# Patient Record
Sex: Male | Born: 1945 | Race: White | Hispanic: No | Marital: Married | Smoking: Never smoker
Health system: Southern US, Community
[De-identification: ages and names within clinical notes are randomized; demographics above are authoritative.]

## PROBLEM LIST (undated history)

## (undated) DIAGNOSIS — S82009A Unspecified fracture of unspecified patella, initial encounter for closed fracture: Secondary | ICD-10-CM

## (undated) DIAGNOSIS — I10 Essential (primary) hypertension: Secondary | ICD-10-CM

## (undated) DIAGNOSIS — E7800 Pure hypercholesterolemia, unspecified: Secondary | ICD-10-CM

## (undated) DIAGNOSIS — I739 Peripheral vascular disease, unspecified: Secondary | ICD-10-CM

## (undated) HISTORY — PX: AMPUTATION: SHX166

---

## 2001-11-01 ENCOUNTER — Encounter: Payer: Self-pay | Admitting: Orthopedic Surgery

## 2001-11-02 ENCOUNTER — Inpatient Hospital Stay (HOSPITAL_COMMUNITY): Admission: RE | Admit: 2001-11-02 | Discharge: 2001-11-05 | Payer: Self-pay | Admitting: Orthopedic Surgery

## 2001-11-02 ENCOUNTER — Encounter (INDEPENDENT_AMBULATORY_CARE_PROVIDER_SITE_OTHER): Payer: Self-pay | Admitting: Specialist

## 2007-11-26 ENCOUNTER — Ambulatory Visit (HOSPITAL_COMMUNITY): Admission: RE | Admit: 2007-11-26 | Discharge: 2007-11-26 | Payer: Self-pay | Admitting: *Deleted

## 2007-11-26 ENCOUNTER — Encounter (INDEPENDENT_AMBULATORY_CARE_PROVIDER_SITE_OTHER): Payer: Self-pay | Admitting: *Deleted

## 2010-08-22 ENCOUNTER — Emergency Department (HOSPITAL_COMMUNITY): Payer: Medicare Other

## 2010-08-22 ENCOUNTER — Emergency Department (HOSPITAL_COMMUNITY)
Admission: EM | Admit: 2010-08-22 | Discharge: 2010-08-22 | Disposition: A | Discharge status: Home / Self Care | Payer: Medicare Other | Attending: Emergency Medicine | Admitting: Emergency Medicine

## 2010-08-22 DIAGNOSIS — E119 Type 2 diabetes mellitus without complications: Secondary | ICD-10-CM | POA: Insufficient documentation

## 2010-08-22 DIAGNOSIS — Z79899 Other long term (current) drug therapy: Secondary | ICD-10-CM | POA: Insufficient documentation

## 2010-08-22 DIAGNOSIS — L02419 Cutaneous abscess of limb, unspecified: Secondary | ICD-10-CM | POA: Insufficient documentation

## 2010-08-22 DIAGNOSIS — M25569 Pain in unspecified knee: Secondary | ICD-10-CM | POA: Insufficient documentation

## 2010-08-22 DIAGNOSIS — E7800 Pure hypercholesterolemia, unspecified: Secondary | ICD-10-CM | POA: Insufficient documentation

## 2010-08-22 DIAGNOSIS — M25519 Pain in unspecified shoulder: Secondary | ICD-10-CM | POA: Insufficient documentation

## 2010-08-22 DIAGNOSIS — I10 Essential (primary) hypertension: Secondary | ICD-10-CM | POA: Insufficient documentation

## 2010-08-22 DIAGNOSIS — M25469 Effusion, unspecified knee: Secondary | ICD-10-CM | POA: Insufficient documentation

## 2010-08-22 LAB — DIFFERENTIAL
Basophils Absolute: 0 10*3/uL (ref 0.0–0.1)
Basophils Relative: 0 % (ref 0–1)
Eosinophils Absolute: 0 10*3/uL (ref 0.0–0.7)
Lymphocytes Relative: 4 % — ABNORMAL LOW (ref 12–46)
Lymphs Abs: 0.5 10*3/uL — ABNORMAL LOW (ref 0.7–4.0)
Monocytes Absolute: 0.9 10*3/uL (ref 0.1–1.0)
Monocytes Relative: 7 % (ref 3–12)
Neutro Abs: 11.4 10*3/uL — ABNORMAL HIGH (ref 1.7–7.7)

## 2010-08-22 LAB — CBC
HCT: 44.6 % (ref 39.0–52.0)
Hemoglobin: 16.2 g/dL (ref 13.0–17.0)
MCH: 31.1 pg (ref 26.0–34.0)
MCHC: 36.3 g/dL — ABNORMAL HIGH (ref 30.0–36.0)
RBC: 5.21 MIL/uL (ref 4.22–5.81)
WBC: 12.8 10*3/uL — ABNORMAL HIGH (ref 4.0–10.5)

## 2010-08-23 LAB — ROCKY MTN SPOTTED FVR AB, IGM-BLOOD: RMSF IgM: 0.13 IV (ref 0.00–0.89)

## 2010-08-23 LAB — ROCKY MTN SPOTTED FVR AB, IGG-BLOOD: RMSF IgG: 0.24 IV

## 2010-08-24 NOTE — Op Note (Signed)
NAME:  KLEBER, CREAN NO.:  [id number]   MEDICAL RECORD NO.:  [id number]          PATIENT TYPE:  AMB   LOCATION:  ENDO                         FACILITY:  [facility]   PHYSICIAN:  Georgiana Spinner, M.D.    DATE OF BIRTH:  04/20/1945   DATE OF PROCEDURE:  11/26/2007  DATE OF DISCHARGE:                               OPERATIVE REPORT   PROCEDURE:  Colonoscopy with biopsy polypectomy and ERBE eradication of  tumor along with injection of bleeding site.   ANESTHESIA:  Fentanyl 125 mcg, Versed 10 mg.   PROCEDURE:  With the patient mildly sedated in the left lateral  decubitus position, a rectal examination was performed which was  unremarkable.  Subsequently, the Pentax videoscopic colonoscope was  inserted into the rectum and passed under direct vision to cecum.  This  required turning the patient to his back and subsequently to his right  side with pressure applied.  Cecum was identified by ileocecal valve and  appendiceal orifice, the latter of which was photographed.  From this  point, the colonoscope was slowly withdrawn taking circumferential views  of colonic mucosa, stopping a few folds removed from the cecum where a  large polyp was seen.  It was multilobulated, sessile, sitting on a fold  and this was photographed and we subsequently used first snare cautery  technique to remove piecemeal parts of the polyp and hot biopsy forceps  technique to also obtain pathology.  Subsequently then we used the ERBE  argon photocoagulator to eradicate the polyp.  In the midst of this, we  got some bleeding, mostly oozing from the center polypectomy site so I  elected to inject 2 mL of epinephrine into this and we stopped the  bleeding and got a blanching of the surrounding mucosa.  We then had to  finish eradicating the posterior border of the polyp which I finally was  able to do, I felt, and we photographed this area.  From this point then  the colonoscope was slowly withdrawn  taking circumferential views of the  remaining colonic mucosa, stopping a 40 cm from anal verge, at which  point there was a second polyp seen much smaller certainly and it was  photographed and removed using snare cautery technique with the same  setting of 20/150 blended current.  Tissue was retrieved by suctioning  it through the endoscope into a tissue trap.  The endoscope was  withdrawn to the rectum which appeared normal on direct and showed  hemorrhoidal tissue on retroflexed view.  The endoscope was straightened  and withdrawn.  The patient's vital signs and pulse oximeter remained  stable.  The patient tolerated procedure well without apparent  complication.   FINDINGS:  Small polyp at 40 cm from anal verge large, multiloculated  polyp of ascending colon  which was removed as described above and  eradicated as described above with ERBE argon photocoagulator.   PLAN:  Await biopsy report and clinical response.  The patient will call  me for results of biopsy and follow-up with me as needed as an  outpatient.  ______________________________  Georgiana Spinner, M.D.     GMO/MEDQ  D:  11/26/2007  T:  11/26/2007  Job:  [id number]

## 2010-08-27 NOTE — H&P (Signed)
[facility]  Patient:    Zachary [name], Zachary [name] Visit Number: [id number] MRN: [id number]          Service Type: MED Location: [phone] Attending Physician:  Nadara Mustard Dictated by:   Nadara Mustard, M.D. Admit Date:  11/02/2001                           History and Physical  HISTORY OF PRESENT ILLNESS:  The patient is a 65 year old gentleman with type 2 diabetes orally controlled who has noted a three week history of a left great toe ulcer.  This has been appropriately treated with p.o. antibiotics and wound care with failure of the ulcer to heal.  Radiographs confirm osteomyelitis with purulent drainage and patient presents at this time for a left great toe amputation.  ALLERGIES:  No known drug allergies.  MEDICATIONS: 1. Glucovance 500 mg p.o. b.i.d. 2. Avandia 4 mg p.o. q.d. 3. Accupril 40 mg q.d.  PAST SURGICAL HISTORY:  None.  FAMILY HISTORY:  Positive for hypertension.  SOCIAL HISTORY:  Negative for tobacco.  Negative for alcohol.  He is married and a Nutritional therapist.  REVIEW OF SYSTEMS:  Positive for type 2 diabetes and hypertension.  PHYSICAL EXAMINATION  VITAL SIGNS:  Temperature 97.6, pulse 80, respiratory rate 16, blood pressure 128/92.  GENERAL:  He is in no acute distress.  LUNGS:  Clear to auscultation.  CARDIOVASCULAR:  Regular rate and rhythm.  NECK:  Supple.  No bruits.  EXTREMITIES:  Examination of both feet:  He has good dorsalis pedis pulses. He does not have protective sensation and cannot feel a 5.07 Semmes-Weinstein monofilament.  He has cellulitis and a draining ulcer over the left great toe.  LABORATORIES:  Radiographs confirm osteomyelitis.  ASSESSMENT:  Osteomyelitis left great toe.  PLAN:  The patient is scheduled for an amputation of the left great toe at this time.  The risks and benefits were discussed including infection, neurovascular injury, nonhealing of the wound, need for a higher level amputation.   The patient states he understands and wishes to proceed at this time. Dictated by:   Nadara Mustard, M.D. Attending Physician:  Nadara Mustard DD:  11/02/01 TD:  11/05/01 Job: [id number]

## 2010-08-27 NOTE — Discharge Summary (Signed)
   NAME:  Zachary [name], Zachary [name]                         ACCOUNT NO.:  [id number]   MEDICAL RECORD NO.:  [id number]                   PATIENT TYPE:  INP   LOCATION:  0454                                 FACILITY:  [facility]   PHYSICIAN:  Nadara Mustard, M.D.                DATE OF BIRTH:  [birth year]   DATE OF ADMISSION:  11/02/2001  DATE OF DISCHARGE:  11/05/2001                                 DISCHARGE SUMMARY   DIAGNOSES:  1. Type 2 diabetes.  2. 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   PROCEDURE:  Amputation of left great toe.   CONDITION ON DISCHARGE:  Discharged to home in stable condition.   FOLLOWUP:  In the office in one week.   HISTORY OF PRESENT ILLNESS:  The patient is a 65 year old gentleman with  type 2 diabetes who has had a three week history of ulcer and drainage from  his left great toe.  The patient presents at this time with a history of  fever, chills, and purulent drainage, and presents for amputation of the  left great toe.   HOSPITAL COURSE:  Essentially unremarkable.  The patient underwent  amputation of the left great toe on 11/02/01.  Cultures were obtained  intraoperatively.  Esmarch at the ankle was used for tourniquet control.  The patient was placed on Kefzol perioperatively for infection, and was  treated postoperatively for the next 72 hours with IV antibiotics.  The  patient's postoperative course was essentially unremarkable.  He was  discharged to home in stable condition on 11/05/01.   DISCHARGE MEDICATIONS:  1. Tylox.  2. Keflex.   DISCHARGE INSTRUCTIONS:  Instructions were given for wound care.   FOLLOWUP:  Instructions to follow up in the office in one week.                                               Nadara Mustard, M.D.    MVD/MEDQ  D:  11/22/2001  T:  11/24/2001  Job:  [id number]

## 2010-08-27 NOTE — Op Note (Signed)
[facility]  Patient:    HULET, EHRMANN Visit Number: [id number] MRN: [id number]          Service Type: MED Location: [phone] Attending Physician:  Nadara Mustard Dictated by:   Nadara Mustard, M.D. Proc. Date: 11/02/01 Admit Date:  11/02/2001                             Operative Report  PREOPERATIVE DIAGNOSES:  Osteomyelitis, left great toe, Wagoner grade 3 ulcer.  POSTOPERATIVE DIAGNOSES:  Osteomyelitis, left great toe, Wagoner grade 3 ulcer.  PROCEDURE:  Left great toe amputation through the MTP joint.  SURGEON:  Nadara Mustard, M.D.  ANESTHESIA:  General.  ESTIMATED BLOOD LOSS:  Minimal.  TOURNIQUET TIME:  Esmarch at the ankle for approximately 10 minutes.  DRAINS:  None.  COMPLICATIONS:  None.  CULTURES:  Cultures obtained x2.  DISPOSITION:  To PACU in stable condition.  INDICATIONS FOR PROCEDURE:  The patient is a 65 year old gentleman with a Wagoner grade 3 ulcer, osteotomyelitis of his left great toe who has failed conservative care with p.o. antibiotics and wound care and presents at this time for surgical intervention.  DESCRIPTION OF PROCEDURE:  The patient was brought to OR room 3 and underwent a general anesthetic. After an adequate level of general anesthesia was obtained, the patients left lower extremity was prepped using Duraprep and draped into a sterile field. Using a fishmouth incision, the great toe was amputated through the MTP joint. The wound was irrigated with normal saline. There was no evidence of any purulence or necrotic tissue that extended to the area of the wound. The wound was closed with a far near near far stitch with 2-0 nylon with no tension on the skin. The wound was covered with Adaptic orthopedic sponges, sterile Webril and a loosely wrapped Coban. The patient was extubated and taken to the PACU in stable condition and plans to follow-up after 72 hours of IV antibiotics. Dictated by:    Nadara Mustard, M.D.  Attending Physician:  Nadara Mustard DD:  11/02/01 TD:  11/06/01 Job: [id number]

## 2010-09-01 ENCOUNTER — Other Ambulatory Visit: Payer: Self-pay | Admitting: Internal Medicine

## 2010-09-01 DIAGNOSIS — R52 Pain, unspecified: Secondary | ICD-10-CM

## 2010-09-01 DIAGNOSIS — R0789 Other chest pain: Secondary | ICD-10-CM

## 2010-09-07 ENCOUNTER — Ambulatory Visit
Admission: RE | Admit: 2010-09-07 | Discharge: 2010-09-07 | Disposition: A | Discharge status: Home / Self Care | Payer: Medicare Other | Source: Ambulatory Visit | Attending: Internal Medicine | Admitting: Internal Medicine

## 2010-09-07 DIAGNOSIS — R0789 Other chest pain: Secondary | ICD-10-CM

## 2010-09-14 ENCOUNTER — Inpatient Hospital Stay (HOSPITAL_COMMUNITY)
Admission: AD | Admit: 2010-09-14 | Discharge: 2010-09-17 | DRG: 549 | Disposition: A | Discharge status: Home / Self Care | Payer: Medicare Other | Source: Ambulatory Visit | Attending: Internal Medicine | Admitting: Internal Medicine

## 2010-09-14 DIAGNOSIS — E785 Hyperlipidemia, unspecified: Secondary | ICD-10-CM | POA: Diagnosis present

## 2010-09-14 DIAGNOSIS — I10 Essential (primary) hypertension: Secondary | ICD-10-CM | POA: Diagnosis present

## 2010-09-14 DIAGNOSIS — R911 Solitary pulmonary nodule: Secondary | ICD-10-CM | POA: Diagnosis present

## 2010-09-14 DIAGNOSIS — M2140 Flat foot [pes planus] (acquired), unspecified foot: Secondary | ICD-10-CM | POA: Diagnosis present

## 2010-09-14 DIAGNOSIS — M009 Pyogenic arthritis, unspecified: Principal | ICD-10-CM | POA: Diagnosis present

## 2010-09-14 DIAGNOSIS — S98139A Complete traumatic amputation of one unspecified lesser toe, initial encounter: Secondary | ICD-10-CM

## 2010-09-14 DIAGNOSIS — E119 Type 2 diabetes mellitus without complications: Secondary | ICD-10-CM | POA: Diagnosis present

## 2010-09-14 DIAGNOSIS — Z860100 Personal history of colon polyps, unspecified: Secondary | ICD-10-CM

## 2010-09-14 DIAGNOSIS — M1990 Unspecified osteoarthritis, unspecified site: Secondary | ICD-10-CM | POA: Diagnosis present

## 2010-09-14 DIAGNOSIS — G4733 Obstructive sleep apnea (adult) (pediatric): Secondary | ICD-10-CM | POA: Diagnosis present

## 2010-09-14 DIAGNOSIS — H5440 Blindness, one eye, unspecified eye: Secondary | ICD-10-CM | POA: Diagnosis present

## 2010-09-14 DIAGNOSIS — F17200 Nicotine dependence, unspecified, uncomplicated: Secondary | ICD-10-CM | POA: Diagnosis present

## 2010-09-14 DIAGNOSIS — A4901 Methicillin susceptible Staphylococcus aureus infection, unspecified site: Secondary | ICD-10-CM | POA: Diagnosis present

## 2010-09-14 LAB — CBC
MCH: 29.8 pg (ref 26.0–34.0)
MCHC: 36 g/dL (ref 30.0–36.0)
MCV: 82.7 fL (ref 78.0–100.0)
Platelets: 174 10*3/uL (ref 150–400)
RDW: 12 % (ref 11.5–15.5)

## 2010-09-14 LAB — GLUCOSE, CAPILLARY: Glucose-Capillary: 202 mg/dL — ABNORMAL HIGH (ref 70–99)

## 2010-09-14 LAB — DIFFERENTIAL
Eosinophils Absolute: 0.1 10*3/uL (ref 0.0–0.7)
Eosinophils Relative: 2 % (ref 0–5)
Lymphs Abs: 1.1 10*3/uL (ref 0.7–4.0)
Monocytes Absolute: 0.8 10*3/uL (ref 0.1–1.0)
Monocytes Relative: 10 % (ref 3–12)

## 2010-09-14 LAB — COMPREHENSIVE METABOLIC PANEL
Albumin: 2.9 g/dL — ABNORMAL LOW (ref 3.5–5.2)
BUN: 24 mg/dL — ABNORMAL HIGH (ref 6–23)
Calcium: 9.1 mg/dL (ref 8.4–10.5)
Chloride: 90 mEq/L — ABNORMAL LOW (ref 96–112)
Creatinine, Ser: 1.26 mg/dL (ref 0.4–1.5)
Total Bilirubin: 0.5 mg/dL (ref 0.3–1.2)
Total Protein: 7.4 g/dL (ref 6.0–8.3)

## 2010-09-15 DIAGNOSIS — I369 Nonrheumatic tricuspid valve disorder, unspecified: Secondary | ICD-10-CM

## 2010-09-15 LAB — GLUCOSE, CAPILLARY: Glucose-Capillary: 113 mg/dL — ABNORMAL HIGH (ref 70–99)

## 2010-09-15 LAB — COMPREHENSIVE METABOLIC PANEL
BUN: 20 mg/dL (ref 6–23)
Calcium: 8.6 mg/dL (ref 8.4–10.5)
Creatinine, Ser: 1.19 mg/dL (ref 0.4–1.5)
Glucose, Bld: 142 mg/dL — ABNORMAL HIGH (ref 70–99)
Total Protein: 6.7 g/dL (ref 6.0–8.3)

## 2010-09-15 LAB — DIFFERENTIAL
Basophils Absolute: 0 10*3/uL (ref 0.0–0.1)
Eosinophils Relative: 3 % (ref 0–5)
Lymphocytes Relative: 16 % (ref 12–46)
Monocytes Absolute: 0.8 10*3/uL (ref 0.1–1.0)

## 2010-09-15 LAB — HEMOGLOBIN A1C: Mean Plasma Glucose: 206 mg/dL — ABNORMAL HIGH (ref <117)

## 2010-09-15 LAB — CBC
HCT: 34.2 % — ABNORMAL LOW (ref 39.0–52.0)
MCHC: 36 g/dL (ref 30.0–36.0)
MCV: 83.6 fL (ref 78.0–100.0)
RDW: 12.2 % (ref 11.5–15.5)

## 2010-09-16 LAB — DIFFERENTIAL
Basophils Absolute: 0.1 10*3/uL (ref 0.0–0.1)
Lymphocytes Relative: 18 % (ref 12–46)
Lymphs Abs: 1.1 10*3/uL (ref 0.7–4.0)
Monocytes Absolute: 0.7 10*3/uL (ref 0.1–1.0)
Neutro Abs: 4.1 10*3/uL (ref 1.7–7.7)

## 2010-09-16 LAB — CBC
HCT: 34.1 % — ABNORMAL LOW (ref 39.0–52.0)
Hemoglobin: 11.8 g/dL — ABNORMAL LOW (ref 13.0–17.0)
MCHC: 34.6 g/dL (ref 30.0–36.0)
MCV: 85 fL (ref 78.0–100.0)

## 2010-09-16 LAB — COMPREHENSIVE METABOLIC PANEL
ALT: 26 U/L (ref 0–53)
Alkaline Phosphatase: 55 U/L (ref 39–117)
BUN: 14 mg/dL (ref 6–23)
CO2: 30 mEq/L (ref 19–32)
Calcium: 8.4 mg/dL (ref 8.4–10.5)
GFR calc non Af Amer: >60 mL/min (ref >60)
Glucose, Bld: 79 mg/dL (ref 70–99)
Potassium: 4 mEq/L (ref 3.5–5.1)
Sodium: 136 mEq/L (ref 135–145)

## 2010-09-16 LAB — GLUCOSE, CAPILLARY: Glucose-Capillary: 97 mg/dL (ref 70–99)

## 2010-09-17 LAB — GLUCOSE, CAPILLARY: Glucose-Capillary: 101 mg/dL — ABNORMAL HIGH (ref 70–99)

## 2010-09-17 LAB — VANCOMYCIN, TROUGH: Vancomycin Tr: 14.4 ug/mL (ref 10.0–20.0)

## 2010-09-18 LAB — CULTURE, BLOOD (ROUTINE X 2)

## 2010-11-27 NOTE — Discharge Summary (Signed)
NAMEMarland Kitchen  CHETT, TANIGUCHI NO.:  [id number]  MEDICAL RECORD NO.:  [id number]  LOCATION:  6708                         FACILITY:  [facility]  PHYSICIAN:  Massie Maroon, MD        DATE OF BIRTH:  09-10-1945  DATE OF ADMISSION:  09/14/2010 DATE OF DISCHARGE:  09/17/2010                              DISCHARGE SUMMARY   DISCHARGE DIAGNOSES: 1. Septic right sternoclavicular joint, ? septic right knee. 2. Diabetes type 2. 3. Hypertension. 4. Hyperlipidemia. 5. Diabetic ulcer x2, July 2003. 6. Left first toe amputation. 7. Osteoarthritis. 8. History of degenerative disk disease and sciatica. 9. Tobacco use in remission. 10.Pes planus. 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 13.History of colonic polyp. 14.Laser eye surgery for diabetic retinopathy.  DISCHARGE MEDICATIONS: 1. Zyvox 600 mg p.o. b.i.d. 2. Levemir 5 units subcu q.a.m. 3. Quinapril 20 mg p.o. daily. 4. Pravastatin 40 mg p.o. daily. 5. Oxycodone 5 mg p.o. t.i.d. p.r.n. pain. 6. Onglyza 5 mg one half p.o. nightly. 7. Nabumetone 750 mg p.o. b.i.d. p.r.n. pain. 8. Glyburide/metformin 5/500 mg two p.o. b.i.d. 9. Cardizem CD 120 mg p.o. daily.  HOSPITAL COURSE:  A 65 year old male with a history of incision and drainage of right patellar abscess by the ER physician and treated with doxycycline.  Apparently complained of fever on Monday, September 13, 2010. The patient was seen by Dr. Thayer Headings, he thought that there might be a chance of septic joint over the right [facility] joint as well as the right knee.  The patient was then evaluated by Dr. Aldean [name].  He did not think that the patient had septic joint at that time.  Blood cultures have been drawn on Monday due to complaints of fever and were positive for Gram-positive cocci and clusters (Staph aureus).  CT scan of the chest on Sep 07, 2010 had shown asymmetric right [place] joint degenerative changes  could be inflammatory arthropathy versus septic arthritis and at that time, it was not thought that the patient had any septic arthritis. The patient also had some small bilateral pulmonary nodules likely intrapulmonary lymph nodes according to CT scan, but repeat of the CT scan was suggested in 6-12 months.  The patient was admitted because of positive blood cultures to rule out any endocarditis.  The patient was evaluated by Dr. Lajoyce Corners and agreed with IV antibiotics and the patient was initiated on vancomycin and Zosyn initially.  Repeat blood cultures on day of hospitalization, September 14, 2010 again back Gram-positive cocci and clusters (Staph aureus), which was resistant to clindamycin, resistant to erythromycin, resistant to penicillin, but sensitive to vancomycin, tetracycline, and oxacillin.  The patient's right knee which appeared to be slightly red, improved.  Redness disappeared on vancomycin and the patient's right sternoclavicular joint has some fullness, but is apparently less painful.  The patient has been afebrile and appears stable and will be discharged home on Zyvox.  Of note, the patient did have a cardiac 2-D echo on September 15, 2010, which was negative for any endocarditis.  CONSULTATIONS:  Dr. Aldean [name], appreciate his input.  PROCEDURES:  Cardiac 2-D echo  on September 15, 2010; EF 55-60%, mild stenosis of the aortic valve, trivial regurgitation, valve area of 1.91 centimeter squared by VTI and 1.71 centimeter squared by Vmax (mitral valve calcified annulus, mildly thickened leaflets, no mention of any evidence of endocarditis).  PHYSICAL EXAMINATION:  VITAL SIGNS:  Temperature 98.3, pulse 84, blood pressure 131/76, pulse ox is 96% on room air. HEENT:  Anicteric. NECK:  No JVD. HEART:  Regular rate and rhythm, S1-S2, no murmurs, gallops, or rubs. LUNGS:  Clear to auscultation bilaterally. ABDOMEN:  Soft, nontender, and nondistended, positive bowel sounds. EXTREMITIES:  No  cyanosis, clubbing, or edema. SKIN:  No rashes.  Slight fullness of the right sternoclavicular joint, no erythema, skin over the right knee initially red, is now disappeared and there is no warmth. MSK:  Good movement of the right knee, no pain either and passive or active range of motion. LYMPH NODES:  No adenopathy. NEUROLOGIC:  Nonfocal.  LABORATORY STUDIES:  Blood cultures on September 14, 2010, positive for Staph aureus, sensitive to oxacillin, but resistant to penicillin, resistant to Avelox, resistant to clindamycin, resistant to erythromycin, resistant to levofloxacin, but sensitive to Bactrim, tetracycline and vancomycin.  Sed rate of September 14, 2010 65, hemoglobin A1c September 14, 2010 8.6.  ASSESSMENT: 1. Septic arthritis ? right knee creases right sternoclavicular joint     as the source:  The patient will be placed on Zyvox 600 mg p.o.     b.i.d. x1 month.  The patient will follow up with Dr. Pearson Grippe in     about 1 week to 2 weeks for a CBC.  We appreciate Dr. Berna Spare Duda's     input.  If the patient develops any fever, he was instructed to     contact the office immediately as well as if there is any bleeding     or shortness of breath. 2. Diabetes type 2, uncontrolled.  The patient is discharged on     metformin as well as for now.  We will try to improve his sugar     control when he follows up in the office. 3. Hypertension, controlled.  Continue Cardizem, continue quinapril. 4. Pulmonary nodules.  Recommended CT of chest in 6 months,     noncontrast.    Massie Maroon, MD    JYK/MEDQ  D:  09/19/2010  T:  09/20/2010  Job:  [id number]  Electronically Signed by Pearson Grippe MD on 11/27/2010 02:22:06 PM

## 2010-11-27 NOTE — H&P (Signed)
NAMEMarland Kitchen  JAWANN, URBANI NO.:  [id number]  MEDICAL RECORD NO.:  [id number]  LOCATION:  6708                         FACILITY:  [facility]  PHYSICIAN:  Massie Maroon, MD        DATE OF BIRTH:  Feb 21, 1946  DATE OF ADMISSION:  09/14/2010 DATE OF DISCHARGE:                             HISTORY & PHYSICAL   CHIEF COMPLAINT:  Fever.  HISTORY OF PRESENT ILLNESS:  A 65 year old male with history of incision and drainage of the right knee patellar?  abscess 3-4 weeks ago by an emergency room physician, apparently complains of fever on Monday.  He was seen by colleague Dr. Thayer Headings, who thought there might be a chance of septic joint.  The patient was then seen by Dr. Aldean [name] on that Monday, 2 days ago.  He did not think that this patient had septic joint.  The patient was also complaining of right sternoclavicular joint pain.  Because of her fever, blood cultures were drawn and preliminarily positive for gram-positive cocci.  CT scan of the chest showed asymmetric right [place] joint degenerative changes as described above. Findings could be due to inflammatory arthropathy.  If there is concern for septic arthritis, joint aspiration may be indicated.  Dr. Aldean [name] this past Monday did not think that joint aspiration was required. There are also some small bilateral pulmonary nodules likely intrapulmonary lymph nodes on CT scan and repeat CT scan of the chest in 6 month was recommended document stability.  Because of positive blood cultures, the patient is admitted to the hospital to rule out any endocarditis.  PAST MEDICAL HISTORY: 1. Diabetes type 2. 2. Hypertension. 3. Hyperlipidemia. 4. History of diabetic ulcer x2 July 2003. 5. History of degenerative disk disease and sciatica. 6. Tobacco use in remission. 7. Osteoarthritis. 8. Left first toe amputation. 9. Pes planus. 10.BPH. 11.History of blindness of the left eye. 12.Mild obstructive sleep apnea,  diagnosed October 16, 2009.  PAST SURGICAL HISTORY:  Colonoscopy November 26, 2007 - small polyp at 40 cm from the anal verge, large multiloculated polyp of the ascending colon, laser eye surgery to stop diabetic retinopathy.  ALLERGIES:  No known drug allergies.  MEDICATIONS:  See MAR, reviewed.  REVIEW OF SYSTEMS:  Negative for all 10 organ systems except for pertinent positives stated above.  PHYSICAL EXAMINATION:  VITAL SIGNS:  Height 5 feet 9 inches, temperature 98.0, pulse 86, blood pressure 124/74, pulse ox 97% on room air. HEENT:  Anicteric. NECK:  No JVD. HEART:  Regular rate and rhythm.  S1, S2.  No murmurs, gallops or rubs. LUNGS:  Clear to auscultation bilaterally. ABDOMEN:  Soft, nontender, positive bowel sounds. EXTREMITIES:  No cyanosis, clubbing or edema. SKIN:  There is some slight area of erythema over the right kneecap, however, this appears to be fading.  The patient does not have any complaints of the joint pain under either passive or active motion. Sternoclavicular joint swelling has decreased.  Note that the patient received doxycycline and then started on Augmentin on Monday.  ASSESSMENT/PLAN: 1. Fever with history of gram-positive cocci on blood cultures, fever     has resolved:  We will check CBC,  CMP, ESR, CRP, urinalysis,     cardiac 2-D echo.  Vancomycin IV and Zosyn IV until we find out the     results of the blood cultures. 2. Diabetes type 2, on home diabetic medications.  Fingerstick blood     sugars a.c. and at bedtime, NovoLog sensitive sliding scale. 3. Hypertension. Continue diltiazem and lisinopril. 4. DVT prophylaxis.  SCDs.     Massie Maroon, MD     JYK/MEDQ  D:  09/15/2010  T:  09/15/2010  Job:  [id number]  Electronically Signed by Pearson Grippe MD on 11/27/2010 02:21:59 PM

## 2011-04-15 DIAGNOSIS — E119 Type 2 diabetes mellitus without complications: Secondary | ICD-10-CM | POA: Diagnosis not present

## 2011-04-29 DIAGNOSIS — N289 Disorder of kidney and ureter, unspecified: Secondary | ICD-10-CM | POA: Diagnosis not present

## 2011-04-29 DIAGNOSIS — E119 Type 2 diabetes mellitus without complications: Secondary | ICD-10-CM | POA: Diagnosis not present

## 2011-04-29 DIAGNOSIS — I10 Essential (primary) hypertension: Secondary | ICD-10-CM | POA: Diagnosis not present

## 2011-05-30 DIAGNOSIS — Z125 Encounter for screening for malignant neoplasm of prostate: Secondary | ICD-10-CM | POA: Diagnosis not present

## 2011-05-30 DIAGNOSIS — R5381 Other malaise: Secondary | ICD-10-CM | POA: Diagnosis not present

## 2011-05-30 DIAGNOSIS — Z79899 Other long term (current) drug therapy: Secondary | ICD-10-CM | POA: Diagnosis not present

## 2011-05-30 DIAGNOSIS — I10 Essential (primary) hypertension: Secondary | ICD-10-CM | POA: Diagnosis not present

## 2011-06-02 DIAGNOSIS — E119 Type 2 diabetes mellitus without complications: Secondary | ICD-10-CM | POA: Diagnosis not present

## 2011-06-03 ENCOUNTER — Other Ambulatory Visit: Payer: Self-pay | Admitting: Internal Medicine

## 2011-06-03 DIAGNOSIS — N289 Disorder of kidney and ureter, unspecified: Secondary | ICD-10-CM

## 2011-06-03 DIAGNOSIS — R911 Solitary pulmonary nodule: Secondary | ICD-10-CM

## 2011-06-06 ENCOUNTER — Ambulatory Visit
Admission: RE | Admit: 2011-06-06 | Discharge: 2011-06-06 | Disposition: A | Discharge status: Home / Self Care | Payer: Medicare Other | Source: Ambulatory Visit | Attending: Internal Medicine | Admitting: Internal Medicine

## 2011-06-06 DIAGNOSIS — N289 Disorder of kidney and ureter, unspecified: Secondary | ICD-10-CM

## 2011-06-06 DIAGNOSIS — R911 Solitary pulmonary nodule: Secondary | ICD-10-CM

## 2011-06-06 DIAGNOSIS — I10 Essential (primary) hypertension: Secondary | ICD-10-CM | POA: Diagnosis not present

## 2011-06-06 DIAGNOSIS — R918 Other nonspecific abnormal finding of lung field: Secondary | ICD-10-CM | POA: Diagnosis not present

## 2011-06-06 DIAGNOSIS — E119 Type 2 diabetes mellitus without complications: Secondary | ICD-10-CM | POA: Diagnosis not present

## 2011-06-09 DIAGNOSIS — E291 Testicular hypofunction: Secondary | ICD-10-CM | POA: Diagnosis not present

## 2011-06-22 DIAGNOSIS — J069 Acute upper respiratory infection, unspecified: Secondary | ICD-10-CM | POA: Diagnosis not present

## 2011-06-22 DIAGNOSIS — R05 Cough: Secondary | ICD-10-CM | POA: Diagnosis not present

## 2011-06-22 DIAGNOSIS — R5381 Other malaise: Secondary | ICD-10-CM | POA: Diagnosis not present

## 2011-06-22 DIAGNOSIS — J029 Acute pharyngitis, unspecified: Secondary | ICD-10-CM | POA: Diagnosis not present

## 2011-07-05 DIAGNOSIS — E7800 Pure hypercholesterolemia, unspecified: Secondary | ICD-10-CM | POA: Diagnosis not present

## 2011-07-05 DIAGNOSIS — E119 Type 2 diabetes mellitus without complications: Secondary | ICD-10-CM | POA: Diagnosis not present

## 2011-07-05 DIAGNOSIS — E291 Testicular hypofunction: Secondary | ICD-10-CM | POA: Diagnosis not present

## 2011-07-05 DIAGNOSIS — I10 Essential (primary) hypertension: Secondary | ICD-10-CM | POA: Diagnosis not present

## 2011-09-06 DIAGNOSIS — E119 Type 2 diabetes mellitus without complications: Secondary | ICD-10-CM | POA: Diagnosis not present

## 2011-09-06 DIAGNOSIS — I10 Essential (primary) hypertension: Secondary | ICD-10-CM | POA: Diagnosis not present

## 2011-09-06 DIAGNOSIS — R5383 Other fatigue: Secondary | ICD-10-CM | POA: Diagnosis not present

## 2011-09-06 DIAGNOSIS — Z79899 Other long term (current) drug therapy: Secondary | ICD-10-CM | POA: Diagnosis not present

## 2011-09-09 DIAGNOSIS — E119 Type 2 diabetes mellitus without complications: Secondary | ICD-10-CM | POA: Diagnosis not present

## 2011-09-09 DIAGNOSIS — I10 Essential (primary) hypertension: Secondary | ICD-10-CM | POA: Diagnosis not present

## 2011-09-09 DIAGNOSIS — E7800 Pure hypercholesterolemia, unspecified: Secondary | ICD-10-CM | POA: Diagnosis not present

## 2011-09-09 DIAGNOSIS — L539 Erythematous condition, unspecified: Secondary | ICD-10-CM | POA: Diagnosis not present

## 2011-10-07 DIAGNOSIS — E119 Type 2 diabetes mellitus without complications: Secondary | ICD-10-CM | POA: Diagnosis not present

## 2011-11-27 ENCOUNTER — Encounter (HOSPITAL_COMMUNITY): Payer: Self-pay | Admitting: Emergency Medicine

## 2011-11-27 ENCOUNTER — Emergency Department (HOSPITAL_COMMUNITY): Payer: Medicare Other

## 2011-11-27 ENCOUNTER — Inpatient Hospital Stay (HOSPITAL_COMMUNITY)
Admission: EM | Admit: 2011-11-27 | Discharge: 2011-11-29 | DRG: 504 | Disposition: A | Discharge status: Home / Self Care | Payer: Medicare Other | Attending: Orthopedic Surgery | Admitting: Orthopedic Surgery

## 2011-11-27 DIAGNOSIS — I96 Gangrene, not elsewhere classified: Secondary | ICD-10-CM | POA: Diagnosis present

## 2011-11-27 DIAGNOSIS — Z7982 Long term (current) use of aspirin: Secondary | ICD-10-CM

## 2011-11-27 DIAGNOSIS — I2510 Atherosclerotic heart disease of native coronary artery without angina pectoris: Secondary | ICD-10-CM | POA: Diagnosis not present

## 2011-11-27 DIAGNOSIS — E119 Type 2 diabetes mellitus without complications: Secondary | ICD-10-CM | POA: Diagnosis not present

## 2011-11-27 DIAGNOSIS — I739 Peripheral vascular disease, unspecified: Secondary | ICD-10-CM | POA: Diagnosis present

## 2011-11-27 DIAGNOSIS — K08109 Complete loss of teeth, unspecified cause, unspecified class: Secondary | ICD-10-CM | POA: Diagnosis present

## 2011-11-27 DIAGNOSIS — M8610 Other acute osteomyelitis, unspecified site: Secondary | ICD-10-CM | POA: Diagnosis not present

## 2011-11-27 DIAGNOSIS — L539 Erythematous condition, unspecified: Secondary | ICD-10-CM | POA: Diagnosis present

## 2011-11-27 DIAGNOSIS — I10 Essential (primary) hypertension: Secondary | ICD-10-CM | POA: Diagnosis present

## 2011-11-27 DIAGNOSIS — Z794 Long term (current) use of insulin: Secondary | ICD-10-CM

## 2011-11-27 DIAGNOSIS — M869 Osteomyelitis, unspecified: Secondary | ICD-10-CM | POA: Diagnosis not present

## 2011-11-27 DIAGNOSIS — Z79899 Other long term (current) drug therapy: Secondary | ICD-10-CM

## 2011-11-27 DIAGNOSIS — E1159 Type 2 diabetes mellitus with other circulatory complications: Secondary | ICD-10-CM | POA: Diagnosis present

## 2011-11-27 DIAGNOSIS — L02818 Cutaneous abscess of other sites: Secondary | ICD-10-CM | POA: Diagnosis not present

## 2011-11-27 DIAGNOSIS — M86679 Other chronic osteomyelitis, unspecified ankle and foot: Secondary | ICD-10-CM | POA: Diagnosis not present

## 2011-11-27 DIAGNOSIS — L97509 Non-pressure chronic ulcer of other part of unspecified foot with unspecified severity: Secondary | ICD-10-CM | POA: Diagnosis not present

## 2011-11-27 DIAGNOSIS — L089 Local infection of the skin and subcutaneous tissue, unspecified: Secondary | ICD-10-CM | POA: Diagnosis not present

## 2011-11-27 HISTORY — DX: Essential (primary) hypertension: I10

## 2011-11-27 LAB — CBC WITH DIFFERENTIAL/PLATELET
Basophils Absolute: 0.1 10*3/uL (ref 0.0–0.1)
Eosinophils Absolute: 0.2 10*3/uL (ref 0.0–0.7)
Eosinophils Relative: 3 % (ref 0–5)
Lymphs Abs: 1.1 10*3/uL (ref 0.7–4.0)
MCH: 31 pg (ref 26.0–34.0)
Neutrophils Relative %: 69 % (ref 43–77)
Platelets: 150 10*3/uL (ref 150–400)
RBC: 5.52 MIL/uL (ref 4.22–5.81)
RDW: 12.5 % (ref 11.5–15.5)
WBC: 6.3 10*3/uL (ref 4.0–10.5)

## 2011-11-27 LAB — COMPREHENSIVE METABOLIC PANEL
ALT: 23 U/L (ref 0–53)
AST: 16 U/L (ref 0–37)
Albumin: 3.7 g/dL (ref 3.5–5.2)
CO2: 26 mEq/L (ref 19–32)
Calcium: 9.1 mg/dL (ref 8.4–10.5)
Creatinine, Ser: 1.62 mg/dL — ABNORMAL HIGH (ref 0.50–1.35)
Sodium: 129 mEq/L — ABNORMAL LOW (ref 135–145)
Total Protein: 7.2 g/dL (ref 6.0–8.3)

## 2011-11-27 LAB — SEDIMENTATION RATE: Sed Rate: 9 mm/hr (ref 0–16)

## 2011-11-27 MED ORDER — INSULIN ASPART 100 UNIT/ML ~~LOC~~ SOLN
0.0000 [IU] | Freq: Three times a day (TID) | SUBCUTANEOUS | Status: DC
  Administered 2011-11-28 – 2011-11-29 (×4): 5 [IU] via SUBCUTANEOUS

## 2011-11-27 MED ORDER — SODIUM CHLORIDE 0.9 % IV SOLN
3.0000 g | Freq: Once | INTRAVENOUS | Status: AC
  Administered 2011-11-27: 3 g via INTRAVENOUS
  Filled 2011-11-27: qty 3

## 2011-11-27 MED ORDER — SODIUM CHLORIDE 0.9 % IV SOLN: INTRAVENOUS | Status: DC

## 2011-11-27 NOTE — ED Provider Notes (Signed)
History     CSN: [id number]  Arrival date & time 11/27/11  1643   First MD Initiated Contact with Patient 11/27/11 1823      Chief Complaint  Patient presents with  . left 2nd toe infection   . Hyperglycemia    (Consider location/radiation/quality/duration/timing/severity/associated sxs/prior treatment) HPI Pt is a 66 yo male w pmh of DM and HTN p/w 3 days of swelling, redness, and pain of L 2nd toe. Mr. Kelter reports that at baseline he has a small callous over the pad of his L 2nd toe which occasionally becomes infected with minor trauma. This past Friday, he noticed some redness and swelling of L 2nd toe of his foot. Over the past three days, pt reports progression of symptoms. This morning his wife noticed a red streak tracking up his shin from the swollen toe and insisted he come to the hospital. He also reported stubbing this same toe a couple of months ago and breaking the skin. He was treated with a course of abx and pain/swelling resolved. 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  He denies fever, chills, nausea, vomiting, CP, SOB, dizziness, presyncope. He says he has not taken his insulin yet today.    Past Medical History  Diagnosis Date  . Diabetes mellitus   . Hypertension     Past Surgical History  Procedure Date  . Amputation     History reviewed. No pertinent family history.  History  Substance Use Topics  . Smoking status: Never Smoker   . Smokeless tobacco: Not on file  . Alcohol Use: No      Review of Systems  Constitutional: Negative for fever and chills.  HENT: Negative for congestion.   Eyes: Negative for visual disturbance.  Respiratory: Negative for cough and chest tightness.   Cardiovascular: Negative for chest pain and palpitations.  Gastrointestinal: Negative for nausea, vomiting, abdominal pain and diarrhea.  Genitourinary: Negative for dysuria, hematuria and  flank pain.  Skin: Positive for wound.       Per HPI  Neurological: Negative for dizziness, syncope, weakness and headaches.    Allergies  Review of patient's allergies indicates no known allergies.  Home Medications   Current Outpatient Rx  Name Route Sig Dispense Refill  . ASPIRIN EC 81 MG PO TBEC Oral Take 81 mg by mouth daily.    Marland Kitchen DILTIAZEM HCL ER 120 MG PO CP24 Oral Take 120 mg by mouth daily.    . OMEGA-3 FATTY ACIDS 1000 MG PO CAPS Oral Take 2 g by mouth daily.    . INSULIN DETEMIR 100 UNIT/ML [place] SOLN Subcutaneous Inject 20 Units into the skin daily.    . INSULIN LISPRO (HUMAN) 100 UNIT/ML [place] SOLN Subcutaneous Inject 4 Units into the skin 3 (three) times daily before meals.    Marland Kitchen PRAVASTATIN SODIUM 40 MG PO TABS Oral Take 40 mg by mouth at bedtime.    . QUINAPRIL HCL 20 MG PO TABS Oral Take 20 mg by mouth 2 (two) times daily.    . TRAMADOL HCL 50 MG PO TABS Oral Take 50 mg by mouth 3 (three) times daily.      BP 111/60  Pulse 85  Temp 98.6 F (37 C) (Oral)  Resp 18  Ht 5' 8.5" (1.74 m)  Wt 189 lb (85.73 kg)  BMI 28.32 kg/m2  SpO2 96%  Physical Exam  Constitutional: He is oriented to person, place, and time. He  appears well-developed and well-nourished. No distress.  HENT:  Head: Normocephalic and atraumatic.  Mouth/Throat: Oropharynx is clear and moist.  Eyes: Conjunctivae are normal. Pupils are equal, round, and reactive to light. No scleral icterus.  Neck: Normal range of motion. Neck supple. No JVD present.  Cardiovascular: Normal rate, regular rhythm, normal heart sounds and intact distal pulses.  Exam reveals no gallop and no friction rub.   No murmur heard. Pulmonary/Chest: Effort normal. No respiratory distress.  Abdominal: Soft. Bowel sounds are normal. He exhibits no distension. There is no tenderness.  Neurological: He is alert and oriented to person, place, and time. No cranial nerve deficit.  Skin: He is not diaphoretic.       ED Course    Procedures (including critical care time)  Labs Reviewed  GLUCOSE, CAPILLARY - Abnormal; Notable for the following:    Glucose-Capillary 340 (*)     All other components within normal limits  CBC WITH DIFFERENTIAL - Abnormal; Notable for the following:    Hemoglobin 17.1 (*)     MCHC 36.3 (*)     All other components within normal limits  COMPREHENSIVE METABOLIC PANEL - Abnormal; Notable for the following:    Sodium 129 (*)     Chloride 93 (*)     Glucose, Bld 305 (*)     BUN 27 (*)     Creatinine, Ser 1.62 (*)     GFR calc non Af Amer 42 (*)     GFR calc Af Amer 49 (*)     All other components within normal limits  SEDIMENTATION RATE   Dg Chest 2 View  11/27/2011  *RADIOLOGY REPORT*  Clinical Data: Coronary artery disease, hypertension.  CHEST - 2 VIEW  Comparison: None.  Findings: Cardiomediastinal silhouette appears normal.  No acute pulmonary disease is noted.  Bony thorax is intact.  IMPRESSION: No acute cardiopulmonary abnormality seen.  Original Report Authenticated By: Venita Sheffield., M.D.   Dg Toe 2nd Left  11/27/2011  *RADIOLOGY REPORT*  Clinical Data: Diabetic foot ulcer.  Rule out osteomyelitis  LEFT SECOND TOE  Comparison: None.  Findings: Soft tissue swelling of the distal second toe.  There is erosion of the tuft of the distal second phalanx compatible with osteomyelitis.  Amputation of the great toe.  IMPRESSION: Osteomyelitis of the distal second phalanx.  Original Report Authenticated By: Camelia Phenes, M.D.    Date: 11/27/2011  Rate: 82  Rhythm: normal sinus rhythm  QRS Axis: normal  Intervals: normal  ST/T Wave abnormalities: normal  Conduction Disutrbances:none  Narrative Interpretation: Normal EKG  Old EKG Reviewed: none available    No diagnosis found.    MDM  1. Osteomyelitis L 2nd toe Pt w cellulitic changes over L 2nd toe and streaking erythema. No leukocytosis or fever. Hemodynamically stable. Gave IV Unasynx1. Xray returned c/w  osteomyelitis of distal phalanx. Called ortho. They will evaluate.  2. Hyperglycemia Pt missed insulin doses today due to concern about toe. BG is 341, normal AG. Suspect will correct w home insulin. Pt npo for now as possible surgical intervention today.   Bronson Curb 11/27/2011 9:58 PM    Ortho has seen, will admit tonight for procedure tomorrow. Bronson Curb 11/27/2011 11:18 PM   Bronson Curb, MD 11/27/11 [phone]

## 2011-11-27 NOTE — ED Notes (Signed)
Was trimming toenail 2 months ago, cut toe, saw dr. Rochele Pages antibiotics, but now is swollen, red, draining clear liquid- has had great toe on left foot amputated for same.

## 2011-11-27 NOTE — ED Notes (Signed)
CBG registered 340 on ED glucometer.

## 2011-11-27 NOTE — ED Notes (Signed)
Pt reports he trimmed his toenails about a month ago and toe started to be painful and swell. Pt reports this happened intermittently and would go away at times. Pt left second toe is swollen, red and draining yellow pus. Pt has red streaking moving up foot and lower leg. Area has been outlined upon arrival.  Pt denies fever. Pt reports toe became swollen on Friday and wife noted red streaking up leg this AM.  Pt has history of great toe on left side removed.  Pt denies pain.  Strong pedal pulse present and patient able to sense light touch to bottom of left foot. Pt reports blood sugars have been running higher in last month

## 2011-11-27 NOTE — H&P (Signed)
Zachary [name] is an 66 y.o. male.   Chief Complaint: left toe infection HPI: Zachary [name] is a 66 year old patient with left second toe swelling erythema. This is been going on for a week to 10 days. Patient describes having similar symptoms with right great toe which required a dictation by Dr. due to. He denies any fever chills or malaise. He denies much in the way of drainage but does report progressive worsening of the appearance of the second toe along with erythema which started ascending his leg within the past 24-48 hours. He has not been on any antibiotics except for that given to him in the emergency room.  Past Medical History  Diagnosis Date  . Diabetes mellitus   . Hypertension     Past Surgical History  Procedure Date  . Amputation     History reviewed. No pertinent family history. Social History:  reports that he has never smoked. He does not have any smokeless tobacco history on file. He reports that he does not drink alcohol or use illicit drugs.  Allergies: No Known Allergies   (Not in a hospital admission)  Results for orders placed during the hospital encounter of 11/27/11 (from the past 48 hour(s))  GLUCOSE, CAPILLARY     Status: Abnormal   Collection Time   11/27/11  5:45 PM      Component Value Range Comment   Glucose-Capillary 340 (*) 70 - 99 mg/dL   CBC WITH DIFFERENTIAL     Status: Abnormal   Collection Time   11/27/11  7:16 PM      Component Value Range Comment   WBC 6.3  4.0 - 10.5 K/uL    RBC 5.52  4.22 - 5.81 MIL/uL    Hemoglobin 17.1 (*) 13.0 - 17.0 g/dL    HCT 96.0  45.4 - 09.8 %    MCV 85.3  78.0 - 100.0 fL    MCH 31.0  26.0 - 34.0 pg    MCHC 36.3 (*) 30.0 - 36.0 g/dL    RDW 11.9  14.7 - 82.9 %    Platelets 150  150 - 400 K/uL    Neutrophils Relative 69  43 - 77 %    Neutro Abs 4.4  1.7 - 7.7 K/uL    Lymphocytes Relative 17  12 - 46 %    Lymphs Abs 1.1  0.7 - 4.0 K/uL    Monocytes Relative 10  3 - 12 %    Monocytes Absolute 0.6  0.1 -  1.0 K/uL    Eosinophils Relative 3  0 - 5 %    Eosinophils Absolute 0.2  0.0 - 0.7 K/uL    Basophils Relative 1  0 - 1 %    Basophils Absolute 0.1  0.0 - 0.1 K/uL   SEDIMENTATION RATE     Status: Normal   Collection Time   11/27/11  7:16 PM      Component Value Range Comment   Sed Rate 9  0 - 16 mm/hr   COMPREHENSIVE METABOLIC PANEL     Status: Abnormal   Collection Time   11/27/11  7:35 PM      Component Value Range Comment   Sodium 129 (*) 135 - 145 mEq/L    Potassium 4.2  3.5 - 5.1 mEq/L    Chloride 93 (*) 96 - 112 mEq/L    CO2 26  19 - 32 mEq/L    Glucose, Bld 305 (*) 70 - 99 mg/dL    BUN  27 (*) 6 - 23 mg/dL    Creatinine, Ser 9.60 (*) 0.50 - 1.35 mg/dL    Calcium 9.1  8.4 - 45.4 mg/dL    Total Protein 7.2  6.0 - 8.3 g/dL    Albumin 3.7  3.5 - 5.2 g/dL    AST 16  0 - 37 U/L    ALT 23  0 - 53 U/L    Alkaline Phosphatase 66  39 - 117 U/L    Total Bilirubin 0.6  0.3 - 1.2 mg/dL    GFR calc non Af Amer 42 (*) >90 mL/min    GFR calc Af Amer 49 (*) >90 mL/min    Dg Chest 2 View  11/27/2011  *RADIOLOGY REPORT*  Clinical Data: Coronary artery disease, hypertension.  CHEST - 2 VIEW  Comparison: None.  Findings: Cardiomediastinal silhouette appears normal.  No acute pulmonary disease is noted.  Bony thorax is intact.  IMPRESSION: No acute cardiopulmonary abnormality seen.  Original Report Authenticated By: Venita Sheffield., M.D.   Dg Toe 2nd Left  11/27/2011  *RADIOLOGY REPORT*  Clinical Data: Diabetic foot ulcer.  Rule out osteomyelitis  LEFT SECOND TOE  Comparison: None.  Findings: Soft tissue swelling of the distal second toe.  There is erosion of the tuft of the distal second phalanx compatible with osteomyelitis.  Amputation of the great toe.  IMPRESSION: Osteomyelitis of the distal second phalanx.  Original Report Authenticated By: Camelia Phenes, M.D.    Review of Systems  Constitutional: Negative.   HENT: Negative.   Eyes: Negative.   Respiratory: Negative.     Cardiovascular: Negative.   Gastrointestinal: Negative.   Genitourinary: Negative.   Musculoskeletal: Negative.   Skin: Negative.   Neurological: Negative.   Endo/Heme/Allergies: Negative.   Psychiatric/Behavioral: Negative.     Blood pressure 111/60, pulse 85, temperature 98.6 F (37 C), temperature source Oral, resp. rate 18, height 5' 8.5" (1.74 m), weight 85.73 kg (189 lb), SpO2 96.00%. Physical Exam  Constitutional: He is oriented to person, place, and time. He appears well-developed.  HENT:  Head: Normocephalic.  Eyes: Pupils are equal, round, and reactive to light.  Neck: Normal range of motion.  Cardiovascular: Normal rate.   Respiratory: Effort normal.  GI: Soft.  Neurological: He is alert and oriented to person, place, and time.  Skin: Skin is warm.  Psychiatric: He has a normal mood and affect. His behavior is normal. Judgment and thought content normal.   left second toe with erythema and gangrene - 1st toe amputated at MTP joint - dp 2/4 - erythema extends proximally up the leg to the midcalf level medially. Compartments are soft. No tissue fluctuance or crepitus. No plantar ulcerations except on the distal tuft of the second toe. There is no fluctuance in the foot and no dorsal erythema. The toe itself has had significant swelling and erythema.  Assessment/Plan Impression is left second toe osteomyelitis. Plan is for admission with IV antibiotics and second toe amputation tomorrow. Risk and benefits discussed with the patient due to limited to infection recurrent infection nerve vessel damage possibility of higher level he dictation. Patient understands the risk and benefits and wishes to proceed. In regards to his hyponatremia plan is for normal saline IV fluid overnight with recheck in the morning. He does have osteomyelitis by radiographs and thus more car surgery. No evidence of fluctuance or abscess in the foot itself. Medical decision-making calcaneum by multiple  medical comorbidities which complicated the patient care as well as  the decision for surgery tomorrow. N.p.o. after 9 AM tomorrow. Anticipate surgery after 5:00 based on the OR availability.  Zachary [name] 11/27/2011, 10:55 PM

## 2011-11-28 ENCOUNTER — Encounter (HOSPITAL_COMMUNITY): Payer: Self-pay | Admitting: Anesthesiology

## 2011-11-28 ENCOUNTER — Encounter (HOSPITAL_COMMUNITY)
Admission: EM | Disposition: A | Discharge status: Home / Self Care | Payer: Self-pay | Source: Home / Self Care | Attending: Orthopedic Surgery

## 2011-11-28 ENCOUNTER — Inpatient Hospital Stay (HOSPITAL_COMMUNITY): Payer: Medicare Other | Admitting: Anesthesiology

## 2011-11-28 DIAGNOSIS — Z794 Long term (current) use of insulin: Secondary | ICD-10-CM | POA: Diagnosis not present

## 2011-11-28 DIAGNOSIS — M869 Osteomyelitis, unspecified: Secondary | ICD-10-CM | POA: Diagnosis not present

## 2011-11-28 DIAGNOSIS — I96 Gangrene, not elsewhere classified: Secondary | ICD-10-CM | POA: Diagnosis not present

## 2011-11-28 DIAGNOSIS — E1159 Type 2 diabetes mellitus with other circulatory complications: Secondary | ICD-10-CM | POA: Diagnosis not present

## 2011-11-28 DIAGNOSIS — L02818 Cutaneous abscess of other sites: Secondary | ICD-10-CM | POA: Diagnosis not present

## 2011-11-28 DIAGNOSIS — M86679 Other chronic osteomyelitis, unspecified ankle and foot: Secondary | ICD-10-CM | POA: Diagnosis not present

## 2011-11-28 DIAGNOSIS — L97509 Non-pressure chronic ulcer of other part of unspecified foot with unspecified severity: Secondary | ICD-10-CM | POA: Diagnosis not present

## 2011-11-28 DIAGNOSIS — L089 Local infection of the skin and subcutaneous tissue, unspecified: Secondary | ICD-10-CM | POA: Diagnosis not present

## 2011-11-28 HISTORY — PX: AMPUTATION: SHX166

## 2011-11-28 LAB — BASIC METABOLIC PANEL
BUN: 17 mg/dL (ref 6–23)
Calcium: 8.8 mg/dL (ref 8.4–10.5)
Creatinine, Ser: 1.15 mg/dL (ref 0.50–1.35)
GFR calc Af Amer: 74 mL/min — ABNORMAL LOW (ref >90)
GFR calc non Af Amer: 64 mL/min — ABNORMAL LOW (ref >90)
Glucose, Bld: 209 mg/dL — ABNORMAL HIGH (ref 70–99)
Potassium: 4.1 mEq/L (ref 3.5–5.1)

## 2011-11-28 LAB — GLUCOSE, CAPILLARY
Glucose-Capillary: 244 mg/dL — ABNORMAL HIGH (ref 70–99)
Glucose-Capillary: 235 mg/dL — ABNORMAL HIGH (ref 70–99)

## 2011-11-28 SURGERY — AMPUTATION, FOOT, RAY
Anesthesia: General | Laterality: Left | Wound class: Dirty or Infected

## 2011-11-28 MED ORDER — SIMVASTATIN 5 MG PO TABS
5.0000 mg | ORAL_TABLET | Freq: Every day | ORAL | Status: DC
  Filled 2011-11-28 (×2): qty 1

## 2011-11-28 MED ORDER — ENOXAPARIN SODIUM 30 MG/0.3ML ~~LOC~~ SOLN
30.0000 mg | Freq: Two times a day (BID) | SUBCUTANEOUS | Status: DC
  Administered 2011-11-29: 30 mg via SUBCUTANEOUS
  Filled 2011-11-28 (×2): qty 0.3

## 2011-11-28 MED ORDER — ONDANSETRON HCL 4 MG/2ML IJ SOLN: 4.0000 mg | Freq: Four times a day (QID) | INTRAMUSCULAR | Status: DC | PRN

## 2011-11-28 MED ORDER — LIDOCAINE HCL (CARDIAC) 20 MG/ML IV SOLN
INTRAVENOUS | Status: DC | PRN
  Administered 2011-11-28: 50 mg via INTRAVENOUS

## 2011-11-28 MED ORDER — CHLORHEXIDINE GLUCONATE 4 % EX LIQD
Freq: Once | CUTANEOUS | Status: AC
  Administered 2011-11-28: 17:00:00 via TOPICAL
  Filled 2011-11-28: qty 15

## 2011-11-28 MED ORDER — VANCOMYCIN HCL IN DEXTROSE 1-5 GM/200ML-% IV SOLN
1000.0000 mg | Freq: Two times a day (BID) | INTRAVENOUS | Status: AC
  Administered 2011-11-29: 1000 mg via INTRAVENOUS
  Filled 2011-11-28: qty 200

## 2011-11-28 MED ORDER — VANCOMYCIN HCL IN DEXTROSE 1-5 GM/200ML-% IV SOLN: 1000.0000 mg | Freq: Two times a day (BID) | INTRAVENOUS | Status: DC

## 2011-11-28 MED ORDER — TRAMADOL HCL 50 MG PO TABS
50.0000 mg | ORAL_TABLET | Freq: Three times a day (TID) | ORAL | Status: DC
  Administered 2011-11-28 – 2011-11-29 (×2): 50 mg via ORAL
  Filled 2011-11-28 (×6): qty 1

## 2011-11-28 MED ORDER — 0.9 % SODIUM CHLORIDE (POUR BTL) OPTIME
TOPICAL | Status: DC | PRN
  Administered 2011-11-28: 1000 mL

## 2011-11-28 MED ORDER — POTASSIUM CHLORIDE IN NACL 20-0.9 MEQ/L-% IV SOLN
INTRAVENOUS | Status: AC
  Administered 2011-11-28: 22:00:00 via INTRAVENOUS
  Filled 2011-11-28: qty 1000

## 2011-11-28 MED ORDER — ONDANSETRON HCL 4 MG/2ML IJ SOLN
INTRAMUSCULAR | Status: DC | PRN
  Administered 2011-11-28: 4 mg via INTRAVENOUS

## 2011-11-28 MED ORDER — DILTIAZEM HCL ER 120 MG PO CP24
120.0000 mg | ORAL_CAPSULE | Freq: Every day | ORAL | Status: DC
  Administered 2011-11-28 – 2011-11-29 (×2): 120 mg via ORAL
  Filled 2011-11-28 (×2): qty 1

## 2011-11-28 MED ORDER — QUINAPRIL HCL 10 MG PO TABS: 20.0000 mg | ORAL_TABLET | Freq: Two times a day (BID) | ORAL | Status: DC

## 2011-11-28 MED ORDER — CHLORHEXIDINE GLUCONATE 4 % EX LIQD
60.0000 mL | Freq: Once | CUTANEOUS | Status: DC
  Filled 2011-11-28: qty 60

## 2011-11-28 MED ORDER — LISINOPRIL 20 MG PO TABS
20.0000 mg | ORAL_TABLET | Freq: Two times a day (BID) | ORAL | Status: DC
  Administered 2011-11-28 – 2011-11-29 (×3): 20 mg via ORAL
  Filled 2011-11-28 (×5): qty 1

## 2011-11-28 MED ORDER — POTASSIUM CHLORIDE IN NACL 20-0.9 MEQ/L-% IV SOLN
INTRAVENOUS | Status: DC
  Administered 2011-11-28: 06:00:00 via INTRAVENOUS
  Filled 2011-11-28 (×2): qty 1000

## 2011-11-28 MED ORDER — VANCOMYCIN HCL 1000 MG IV SOLR
1000.0000 mg | INTRAVENOUS | Status: DC | PRN
  Administered 2011-11-28: 1000 mg via INTRAVENOUS

## 2011-11-28 MED ORDER — METHOCARBAMOL 500 MG PO TABS: 500.0000 mg | ORAL_TABLET | Freq: Four times a day (QID) | ORAL | Status: DC | PRN

## 2011-11-28 MED ORDER — METOCLOPRAMIDE HCL 10 MG PO TABS: 5.0000 mg | ORAL_TABLET | Freq: Three times a day (TID) | ORAL | Status: DC | PRN

## 2011-11-28 MED ORDER — FENTANYL CITRATE 0.05 MG/ML IJ SOLN
INTRAMUSCULAR | Status: DC | PRN
  Administered 2011-11-28 (×2): 50 ug via INTRAVENOUS

## 2011-11-28 MED ORDER — LACTATED RINGERS IV SOLN
INTRAVENOUS | Status: DC | PRN
  Administered 2011-11-28: 17:00:00 via INTRAVENOUS

## 2011-11-28 MED ORDER — METOCLOPRAMIDE HCL 5 MG/ML IJ SOLN: 5.0000 mg | Freq: Three times a day (TID) | INTRAMUSCULAR | Status: DC | PRN

## 2011-11-28 MED ORDER — LACTATED RINGERS IV SOLN: INTRAVENOUS | Status: DC

## 2011-11-28 MED ORDER — ONDANSETRON HCL 4 MG PO TABS: 4.0000 mg | ORAL_TABLET | Freq: Four times a day (QID) | ORAL | Status: DC | PRN

## 2011-11-28 MED ORDER — PROPOFOL 10 MG/ML IV EMUL
INTRAVENOUS | Status: DC | PRN
  Administered 2011-11-28: 160 mg via INTRAVENOUS

## 2011-11-28 MED ORDER — ACETAMINOPHEN 10 MG/ML IV SOLN
INTRAVENOUS | Status: AC
  Filled 2011-11-28: qty 100

## 2011-11-28 MED ORDER — VANCOMYCIN HCL IN DEXTROSE 1-5 GM/200ML-% IV SOLN
INTRAVENOUS | Status: AC
  Filled 2011-11-28: qty 200

## 2011-11-28 MED ORDER — OXYCODONE HCL 5 MG PO TABS
5.0000 mg | ORAL_TABLET | ORAL | Status: DC | PRN
  Administered 2011-11-29: 5 mg via ORAL
  Filled 2011-11-28: qty 1

## 2011-11-28 MED ORDER — MIDAZOLAM HCL 5 MG/5ML IJ SOLN
INTRAMUSCULAR | Status: DC | PRN
  Administered 2011-11-28: 2 mg via INTRAVENOUS

## 2011-11-28 MED ORDER — METHOCARBAMOL 100 MG/ML IJ SOLN
500.0000 mg | Freq: Four times a day (QID) | INTRAVENOUS | Status: DC | PRN
  Filled 2011-11-28: qty 5

## 2011-11-28 MED ORDER — LISINOPRIL 2.5 MG PO TABS: 2.5000 mg | ORAL_TABLET | Freq: Every day | ORAL | Status: DC

## 2011-11-28 MED ORDER — ACETAMINOPHEN 10 MG/ML IV SOLN
INTRAVENOUS | Status: DC | PRN
  Administered 2011-11-28: 1000 mg via INTRAVENOUS

## 2011-11-28 SURGICAL SUPPLY — 35 items
BAG SPEC THK2 15X12 ZIP CLS (MISCELLANEOUS) ×1
BAG ZIPLOCK 12X15 (MISCELLANEOUS) ×2 IMPLANT
BANDAGE ELASTIC 4 VELCRO ST LF (GAUZE/BANDAGES/DRESSINGS) ×2 IMPLANT
BLADE MIC 41X13 (BLADE) ×2 IMPLANT
BNDG COHESIVE 4X5 TAN STRL (GAUZE/BANDAGES/DRESSINGS) ×2 IMPLANT
CLOTH BEACON ORANGE TIMEOUT ST (SAFETY) ×2 IMPLANT
DRAPE SURG 17X11 SM STRL (DRAPES) ×4 IMPLANT
DRAPE U-SHAPE 47X51 STRL (DRAPES) ×2 IMPLANT
DRSG ADAPTIC 3X8 NADH LF (GAUZE/BANDAGES/DRESSINGS) ×2 IMPLANT
DURAPREP 26ML APPLICATOR (WOUND CARE) ×2 IMPLANT
ELECT REM PT RETURN 9FT ADLT (ELECTROSURGICAL) ×2
ELECTRODE REM PT RTRN 9FT ADLT (ELECTROSURGICAL) ×1 IMPLANT
GAUZE SPONGE 4X4 16PLY XRAY LF (GAUZE/BANDAGES/DRESSINGS) ×2 IMPLANT
GAUZE XEROFORM 1X8 LF (GAUZE/BANDAGES/DRESSINGS) ×2 IMPLANT
GLOVE SURG ORTHO 8.0 STRL STRW (GLOVE) ×2 IMPLANT
GOWN STRL NON-REIN LRG LVL3 (GOWN DISPOSABLE) ×2 IMPLANT
HANDPIECE INTERPULSE COAX TIP (DISPOSABLE) ×2
KIT BASIN OR (CUSTOM PROCEDURE TRAY) ×2 IMPLANT
NEEDLE HYPO 22GX1.5 SAFETY (NEEDLE) ×2 IMPLANT
NS IRRIG 1000ML POUR BTL (IV SOLUTION) ×2 IMPLANT
PACK LOWER EXTREMITY WL (CUSTOM PROCEDURE TRAY) ×2 IMPLANT
PAD CAST 4YDX4 CTTN HI CHSV (CAST SUPPLIES) ×1 IMPLANT
PADDING CAST COTTON 4X4 STRL (CAST SUPPLIES) ×2
PADDING CAST SYNTHETIC 4 (CAST SUPPLIES) ×1
PADDING CAST SYNTHETIC 4X4 STR (CAST SUPPLIES) ×1 IMPLANT
POSITIONER SURGICAL ARM (MISCELLANEOUS) ×2 IMPLANT
SET HNDPC FAN SPRY TIP SCT (DISPOSABLE) ×1 IMPLANT
SPONGE GAUZE 4X4 12PLY (GAUZE/BANDAGES/DRESSINGS) ×2 IMPLANT
SUCTION FRAZIER TIP 10 FR DISP (SUCTIONS) ×2 IMPLANT
SUT ETHILON 2 0 PSLX (SUTURE) ×2 IMPLANT
SUT VIC AB 2-0 CT1 27 (SUTURE) ×2
SUT VIC AB 2-0 CT1 TAPERPNT 27 (SUTURE) ×1 IMPLANT
SYR CONTROL 10ML LL (SYRINGE) ×2 IMPLANT
TOWEL OR 17X26 10 PK STRL BLUE (TOWEL DISPOSABLE) ×6 IMPLANT
WATER STERILE IRR 1500ML POUR (IV SOLUTION) ×2 IMPLANT

## 2011-11-28 NOTE — ED Notes (Signed)
Report attempted, RN unavailable.

## 2011-11-28 NOTE — Preoperative (Signed)
Beta Blockers   Reason not to administer Beta Blockers:Not Applicable 

## 2011-11-28 NOTE — Progress Notes (Signed)
Orthopedic Tech Progress Note Patient Details:  Zachary [name] 1945/07/12 [id number]  Patient ID: [name], male   DOB: [birth year], 66 y.o.   MRN: [id number] Viewed order from doctor's order list  Nikki Dom 11/28/2011, 7:56 PM

## 2011-11-28 NOTE — ED Notes (Signed)
Report called to RN, 6E

## 2011-11-28 NOTE — Transfer of Care (Signed)
Immediate Anesthesia Transfer of Care Note  Patient: Zachary [name]  Procedure(s) Performed: Procedure(s) (LRB): AMPUTATION RAY (Left)  Patient Location: PACU  Anesthesia Type: General  Level of Consciousness: sedated  Airway & Oxygen Therapy: Patient Spontanous Breathing and Patient connected to face mask oxygen  Post-op Assessment: Report given to PACU RN and Post -op Vital signs reviewed and stable  Post vital signs: Reviewed and stable  Complications: No apparent anesthesia complications

## 2011-11-28 NOTE — ED Provider Notes (Signed)
I saw and evaluated the patient, reviewed the resident's note and I agree with the findings and plan.  The patient presents complaining of a red, swollen, painful toe.  He has a history of DM and the big toe of the same foot has been removed in the past.  He denies fevers, chills, or vomiting.  On exam, the patient is afebrile and the vitals are stable.  The heart and lungs are unremarkable.  The left second toe is noted to have significant erythema, swelling, with streaks extending into the ankle and pretibial area.  He was given unasyn.  Labs are essentially unremarkable.  An xray of the foot was obtained which revealed osteomyelitis.  Orthopedics has been consulted and has seen the patient.  He will be admitted to their service for likely toe amputation in the am.    Geoffery Lyons, MD 11/28/11 [phone]

## 2011-11-28 NOTE — Brief Op Note (Signed)
11/27/2011 - 11/28/2011  6:08 PM  PATIENT:  Zachary [name]  66 y.o. male  PRE-OPERATIVE DIAGNOSIS:  left 2nd toe infection  POST-OPERATIVE DIAGNOSIS:  left 2nd toe infection  PROCEDURE:  Procedure(s): AMPUTATION second toe through MTP joint  SURGEON:  Surgeon(s): Cammy Copa, MD  ASSISTANT: none  ANESTHESIA:   general  EBL: 2 ml    Total I/O In: 413.3 [I.V.:413.3] Out: 1000 [Urine:1000]  BLOOD ADMINISTERED: none  DRAINS: none   LOCAL MEDICATIONS USED:  none  SPECIMEN:  No Specimen  COUNTS:  YES  TOURNIQUET:  * No tourniquets in log *  DICTATION: .Other Dictation: Dictation Number [id number]  PLAN OF CARE: Admit to inpatient   PATIENT DISPOSITION:  PACU - hemodynamically stable

## 2011-11-28 NOTE — Anesthesia Preprocedure Evaluation (Addendum)
Anesthesia Evaluation  Patient identified by MRN, date of birth, ID band Patient awake    Reviewed: Allergy & Precautions, H&P , NPO status , Patient's Chart, lab work & pertinent test results  Airway Mallampati: II TM Distance: >3 FB Neck ROM: Full    Dental  (+) Edentulous Upper and Edentulous Lower   Pulmonary neg pulmonary ROS,  breath sounds clear to auscultation  Pulmonary exam normal       Cardiovascular hypertension, Pt. on medications + Peripheral Vascular Disease Rhythm:Regular Rate:Normal     Neuro/Psych negative neurological ROS  negative psych ROS   GI/Hepatic negative GI ROS, Neg liver ROS,   Endo/Other  Poorly Controlled, Type 2, Insulin Dependent  Renal/GU negative Renal ROS  negative genitourinary   Musculoskeletal negative musculoskeletal ROS (+)   Abdominal   Peds  Hematology negative hematology ROS (+)   Anesthesia Other Findings   Reproductive/Obstetrics negative OB ROS                          Anesthesia Physical Anesthesia Plan  ASA: III  Anesthesia Plan: General   Post-op Pain Management:    Induction: Intravenous  Airway Management Planned: LMA  Additional Equipment:   Intra-op Plan:   Post-operative Plan: Extubation in OR  Informed Consent: I have reviewed the patients History and Physical, chart, labs and discussed the procedure including the risks, benefits and alternatives for the proposed anesthesia with the patient or authorized representative who has indicated his/her understanding and acceptance.   Dental advisory given  Plan Discussed with: CRNA  Anesthesia Plan Comments:         Anesthesia Quick Evaluation

## 2011-11-28 NOTE — Progress Notes (Signed)
Orthopedic Tech Progress Note Patient Details:  Zachary [name] 03/09/1946 [id number]  Ortho Devices Type of Ortho Device: Postop boot Ortho Device/Splint Location: left foot Ortho Device/Splint Interventions: Freeman Caldron, Saphyre Cillo 11/28/2011, 7:56 PM

## 2011-11-28 NOTE — Clinical Documentation Improvement (Signed)
DIABETIC  DOCUMENTATION CLARIFICATION QUERY  THIS DOCUMENT IS NOT A PERMANENT PART OF THE MEDICAL RECORD  TO RESPOND TO THE THIS QUERY, FOLLOW THE INSTRUCTIONS BELOW:  1. If needed, update documentation for the patient's encounter via the notes activity.  2. Access this query again and click edit on the In [employer].  3. After updating, or not, click F2 to complete all highlighted (required) fields concerning your review. Select "additional documentation in the medical record" OR "no additional documentation provided".  4. Click Sign note button.  5. The deficiency will fall out of your In Basket *Please let us know if you are not able to complete this workflow by phone or e-mail (listed below).  Please update your documentation within the medical record to reflect your response to this query.                                                                                        11/28/11   Dear Dr.Gregory Lorin [facility],  In a better effort to capture your patient's severity of illness, reflect appropriate length of stay and utilization of resources, a review of the patient medical record has revealed the following indicators.    Based on your clinical judgment, please clarify and document in a progress note and/or discharge summary the clinical condition associated with the following supporting information:  In responding to this query please exercise your independent judgment.  The fact that a query is asked, does not imply that any particular answer is desired or expected.  Pt with osteomyelitis necessitating a ray amputation of left 2nd toe.   Please clarify if pt's osteomyelitis can be linked to pt's DM as the underlying cause for the ray amputation of left 2nd toe and document in pt pn and d/c summary.    Possible Conditions:   Associated conditions: _______DM cellulitis _______DM gangrene _______DM osteomyelitis _______DM skin ulcer  _______Other  Condition _______Cannot Clinically determine     Supporting Information:  Risk Factors: Osteomyelitis of left 2nd toe, hyponatremia, HTN, DM   Signs & Symptoms: PN 11/27/11 Pt is a 66 yo male w pmh of DM and HTN p/w 3 days of swelling, redness, and pain of L 2nd toe. Mr. Durfee reports that at baseline he has a small callous over the pad of his L 2nd toe which occasionally becomes infected with minor trauma. This past Friday, he noticed some redness and swelling of L 2nd toe of his foot. Over the past three days, pt reports progression of symptoms. This morning his wife noticed a red streak tracking up his shin from the swollen toe and insisted he come to the hospital.   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    Diagnostics:  Radiology: 11/27/11 Amputation of the great toe.  IMPRESSION: Osteomyelitis of the distal second phalanx.   Blood glucose range  Component     Latest Ref Rng 11/27/2011 11/28/2011         12:59 AM  Glucose-Capillary     70 - 99 mg/dL 161 (H) 096 (H)   Component  Latest Ref Rng 11/28/2011 11/28/2011         7:51 AM 12:19 PM  Glucose-Capillary     70 - 99 mg/dL 161 (H) 096 (H)    Treatment: Insulin Novolog CBG monitioring  You may use possible, probable, or suspect with inpatient documentation. possible, probable, suspected diagnoses MUST be documented at the time of discharge  Reviewed:  no additional documentation provided ljh  Thank You,  Enis Slipper  RN, BSN, MSN/Inf, CCDS Clinical Documentation Specialist [facility] Dept Pager: [phone] / E-mail: [email or web address]  Health Information Management [place]

## 2011-11-29 ENCOUNTER — Encounter (HOSPITAL_COMMUNITY): Payer: Self-pay | Admitting: Orthopedic Surgery

## 2011-11-29 LAB — GLUCOSE, CAPILLARY: Glucose-Capillary: 223 mg/dL — ABNORMAL HIGH (ref 70–99)

## 2011-11-29 MED ORDER — OXYCODONE HCL 5 MG PO TABS: 5.0000 mg | ORAL_TABLET | ORAL | Status: AC | PRN

## 2011-11-29 MED ORDER — METHOCARBAMOL 500 MG PO TABS: 500.0000 mg | ORAL_TABLET | Freq: Four times a day (QID) | ORAL | Status: AC | PRN

## 2011-11-29 NOTE — Care Management Note (Signed)
    Page 1 of 2   11/29/2011     3:07:08 PM   CARE MANAGEMENT NOTE 11/29/2011  Patient:  Zachary [name], Zachary [name]   Account Number:  [id number]  Date Initiated:  11/29/2011  Documentation initiated by:  Colleen Can  Subjective/Objective Assessment:   dx left foot infection; 2nd toe-left amputation     Action/Plan:   CM spoke with patient. Plans are for return to home in [place] where spouse will be caregiver. States she has cane and hard alking shoe issued from hospital. No HH needs   Anticipated DC Date:  11/29/2011   Anticipated DC Plan:  HOME/SELF CARE  In-house referral  NA      DC Planning Services  CM consult      PAC Choice  NA   Choice offered to / List presented to:  NA   DME arranged  NA      DME agency  NA     HH arranged  NA      HH agency  NA   Status of service:  Completed, signed off Medicare Important Message given?  NA - LOS <3 / Initial given by admissions (If response is "NO", the following Medicare IM given date fields will be blank) Date Medicare IM given:   Date Additional Medicare IM given:    Discharge Disposition:  HOME/SELF CARE  Per UR Regulation:    If discussed at Long Length of Stay Meetings, dates discussed:    Comments:

## 2011-11-29 NOTE — Op Note (Signed)
NAMEMarland Kitchen  MALIEK, SCHELLHORN NO.:  [id number]  MEDICAL RECORD NO.:  [id number]  LOCATION:  1619                         FACILITY:  [facility]  PHYSICIAN:  Burnard Bunting, M.D.    DATE OF BIRTH:  Sep 08, 1945  DATE OF PROCEDURE:  11/28/2011 DATE OF DISCHARGE:                              OPERATIVE REPORT   PREOPERATIVE DIAGNOSIS:  Left second toe infection.  POSTOPERATIVE DIAGNOSIS:  Left second toe infection.  PROCEDURE:  Left second toe amputation to MTP joint.  SURGEON:  Burnard Bunting, M.D.  ASSISTANT:  None.  ANESTHESIA:  General endotracheal.  ESTIMATED BLOOD LOSS:  Minimal.  Ankle Esmarch utilized for approximately 10 minutes.  INDICATIONS:  Zachary [name] is a patient with left second toe infection presents for amputation after explanation of risks and benefits.  PROCEDURE:  The patient was brought to the operating room, where general endotracheal anesthesia was induced.  Preoperative antibiotics were administered.  Time-out was called.  Left foot was prepped with Hibiclens and saline, draped in sterile manner.  Time-out was called. The elliptical incision was made around the base of the proximal phalanx.  Skin and subcutaneous tissue were sharply divided.  MTP joint was then disarticulated under direct visualization taken care to maintain full-thickness skin flaps and subperiosteal elevation.  The toe itself was sent to pathology.  Ankle Esmarch was released.  Bleeding points were encountered and closed with electrocautery.  Thorough irrigation was performed and the incision was closed using one 3-0 Vicryl and four 3-0 nylon sutures.  Bulky dressing was applied.  The patient tolerated the procedure well without immediate complications.     Burnard Bunting, M.D.     GSD/MEDQ  D:  11/28/2011  T:  11/29/2011  Job:  [id number]

## 2011-11-29 NOTE — Progress Notes (Signed)
Talked with nurse - pt doing well - for dc today - pt has rx

## 2011-11-29 NOTE — Evaluation (Signed)
Physical Therapy One Time Evaluation Patient Details Name: Zachary [name] MRN: [id number] DOB: July 07, 1945 Today's Date: 11/29/2011 Time: 0865-7846 PT Time Calculation (min): 8 min  PT Assessment / Plan / Recommendation Clinical Impression  Pt s/p L second toe amputation to MTP joint.  Pt reports he makes walking sticks for a hobby and usually ambulates with one.  Pt did well with ambulation without assistive device and educated to wear shoe until MD specified otherwise.  Pt ready for d/c home today.  No further acute care needs identified.    PT Assessment  Patent does not need any further PT services    Follow Up Recommendations  No PT follow up    Barriers to Discharge        Equipment Recommendations  None recommended by PT    Recommendations for Other Services     Frequency      Precautions / Restrictions Restrictions LLE Weight Bearing: Weight bearing as tolerated Other Position/Activity Restrictions: with hard sole shoe   Pertinent Vitals/Pain Pt reports premedication and no pain at rest or with ambulation.      Mobility  Bed Mobility Bed Mobility: Supine to Sit;Sit to Supine Supine to Sit: 6: Modified independent (Device/Increase time);HOB elevated Sit to Supine: 6: Modified independent (Device/Increase time);HOB elevated Details for Bed Mobility Assistance: pt donned shoe at EOB Transfers Transfers: Stand to Sit;Sit to Stand Sit to Stand: 6: Modified independent (Device/Increase time) Stand to Sit: 6: Modified independent (Device/Increase time) Ambulation/Gait Ambulation/Gait Assistance: 6: Modified independent (Device/Increase time) Ambulation Distance (Feet): 400 Feet Assistive device: None Ambulation/Gait Assistance Details: pt able to correctly verbalize use of walking stick and/or cane if needed at home Gait Pattern: Decreased stance time - left;Antalgic General Gait Details: no unsteadiness or LOB    Exercises     PT Diagnosis:    PT Problem List:     PT Treatment Interventions:     PT Goals    Visit Information  Last PT Received On: 11/29/11 Assistance Needed: +1    Subjective Data  Subjective: I hope I can get out of here by 11am.   Prior Functioning  Home Living Lives With: Spouse Type of Home: House Home Access: Stairs to enter [employer] of Steps: 1 Entrance Stairs-Rails: None Home Layout: Two level;Able to live on main level with bedroom/bathroom Home Adaptive Equipment: Straight cane;Walker - rolling;Wheelchair - manual Prior Function Level of Independence: Independent with assistive device(s) Comments: uses walking stick Communication Communication: No difficulties    Cognition  Overall Cognitive Status: Appears within functional limits for tasks assessed/performed Arousal/Alertness: Awake/alert Orientation Level: Appears intact for tasks assessed Behavior During Session: [facility] for tasks performed    Extremity/Trunk Assessment Right Upper Extremity Assessment RUE ROM/Strength/Tone: [facility] for tasks assessed Left Upper Extremity Assessment LUE ROM/Strength/Tone: [facility] for tasks assessed Right Lower Extremity Assessment RLE ROM/Strength/Tone: [facility] for tasks assessed Left Lower Extremity Assessment LLE ROM/Strength/Tone: [facility] for tasks assessed   Balance    End of Session PT - End of Session Activity Tolerance: Patient tolerated treatment well Patient left: in bed;with call bell/phone within reach  GP     [facility] E 11/29/2011, 11:39 AM Pager: [phone]

## 2011-12-01 ENCOUNTER — Encounter (HOSPITAL_COMMUNITY): Payer: Self-pay

## 2011-12-01 NOTE — Anesthesia Postprocedure Evaluation (Signed)
Anesthesia Post Note  Patient: Zachary [name]  Procedure(s) Performed: Procedure(s) (LRB): AMPUTATION RAY (Left)  Anesthesia type: General  Patient location: PACU  Post pain: Pain level controlled  Post assessment: Post-op Vital signs reviewed  Last Vitals:  Filed Vitals:   11/29/11 0520  BP: 119/72  Pulse: 65  Temp: 36.4 C  Resp: 14    Post vital signs: Reviewed  Level of consciousness: sedated  Complications: No apparent anesthesia complications

## 2011-12-08 DIAGNOSIS — M7700 Medial epicondylitis, unspecified elbow: Secondary | ICD-10-CM | POA: Diagnosis not present

## 2011-12-25 NOTE — Discharge Summary (Signed)
Physician Discharge Summary  Patient ID: Zachary [name] MRN: [id number] DOB/AGE: 1945/10/27 66 y.o.  Admit date: 11/27/2011 Discharge date: 12/25/2011  Admission Diagnoses:  Toe infection  Discharge Diagnoses:  Same  Surgeries: Procedure(s): AMPUTATION RAY on 11/27/2011 - 11/28/2011   Consultants:    Discharged Condition: Stable  Hospital Course: Zachary [name] is an 66 y.o. male who was admitted 11/27/2011 with a chief complaint of  Chief Complaint  Patient presents with  . left 2nd toe infection   . 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  They were brought to the operating room on 11/27/2011 - 11/28/2011 and underwent the above named procedures. He did well and was dced on post op day 2.   Antibiotics given:  Anti-infectives     Start     Dose/Rate Route Frequency Ordered Stop   11/29/11 0530   vancomycin (VANCOCIN) IVPB 1000 mg/200 mL premix        1,000 mg 200 mL/hr over 60 Minutes Intravenous Every 12 hours 11/28/11 1856 11/29/11 0557   11/28/11 1900   vancomycin (VANCOCIN) IVPB 1000 mg/200 mL premix  Status:  Discontinued        1,000 mg 200 mL/hr over 60 Minutes Intravenous Every 12 hours 11/28/11 1856 11/28/11 1859   11/27/11 1945  Ampicillin-Sulbactam (UNASYN) 3 g in sodium chloride 0.9 % 100 mL IVPB       3 g 100 mL/hr over 60 Minutes Intravenous  Once 11/27/11 1937 11/27/11 2111        .  Recent vital signs:  Filed Vitals:   11/29/11 0520  BP: 119/72  Pulse: 65  Temp: 97.5 F (36.4 C)  Resp: 14    Recent laboratory studies:  Results for orders placed during the hospital encounter of 11/27/11  GLUCOSE, CAPILLARY      Component Value Range   Glucose-Capillary 340 (*) 70 - 99 mg/dL  CBC WITH DIFFERENTIAL      Component Value Range   WBC 6.3  4.0 - 10.5 K/uL   RBC 5.52  4.22 - 5.81 MIL/uL   Hemoglobin 17.1 (*) 13.0 - 17.0 g/dL   HCT 91.4  78.2 - 95.6 %   MCV 85.3  78.0 - 100.0 fL   MCH 31.0  26.0 - 34.0 pg   MCHC  36.3 (*) 30.0 - 36.0 g/dL   RDW 21.3  08.6 - 57.8 %   Platelets 150  150 - 400 K/uL   Neutrophils Relative 69  43 - 77 %   Neutro Abs 4.4  1.7 - 7.7 K/uL   Lymphocytes Relative 17  12 - 46 %   Lymphs Abs 1.1  0.7 - 4.0 K/uL   Monocytes Relative 10  3 - 12 %   Monocytes Absolute 0.6  0.1 - 1.0 K/uL   Eosinophils Relative 3  0 - 5 %   Eosinophils Absolute 0.2  0.0 - 0.7 K/uL   Basophils Relative 1  0 - 1 %   Basophils Absolute 0.1  0.0 - 0.1 K/uL  SEDIMENTATION RATE      Component Value Range   Sed Rate 9  0 - 16 mm/hr  COMPREHENSIVE METABOLIC PANEL      Component Value Range   Sodium 129 (*) 135 - 145 mEq/L   Potassium 4.2  3.5 - 5.1 mEq/L   Chloride 93 (*) 96 - 112 mEq/L   CO2 26  19 - 32 mEq/L   Glucose, Bld 305 (*) 70 -  99 mg/dL   BUN 27 (*) 6 - 23 mg/dL   Creatinine, Ser 1.61 (*) 0.50 - 1.35 mg/dL   Calcium 9.1  8.4 - 09.6 mg/dL   Total Protein 7.2  6.0 - 8.3 g/dL   Albumin 3.7  3.5 - 5.2 g/dL   AST 16  0 - 37 U/L   ALT 23  0 - 53 U/L   Alkaline Phosphatase 66  39 - 117 U/L   Total Bilirubin 0.6  0.3 - 1.2 mg/dL   GFR calc non Af Amer 42 (*) >90 mL/min   GFR calc Af Amer 49 (*) >90 mL/min  GLUCOSE, CAPILLARY      Component Value Range   Glucose-Capillary 220 (*) 70 - 99 mg/dL   Comment 1 Documented in Chart     Comment 2 Notify RN    SURGICAL PCR SCREEN      Component Value Range   MRSA, PCR NEGATIVE  NEGATIVE   Staphylococcus aureus NEGATIVE  NEGATIVE  GLUCOSE, CAPILLARY      Component Value Range   Glucose-Capillary 244 (*) 70 - 99 mg/dL   Comment 1 Notify RN     Comment 2 Documented in Chart    GLUCOSE, CAPILLARY      Component Value Range   Glucose-Capillary 235 (*) 70 - 99 mg/dL   Comment 1 Notify RN     Comment 2 Documented in Chart    BASIC METABOLIC PANEL      Component Value Range   Sodium 135  135 - 145 mEq/L   Potassium 4.1  3.5 - 5.1 mEq/L   Chloride 100  96 - 112 mEq/L   CO2 24  19 - 32 mEq/L   Glucose, Bld 209 (*) 70 - 99 mg/dL   BUN 17  6  - 23 mg/dL   Creatinine, Ser 0.45  0.50 - 1.35 mg/dL   Calcium 8.8  8.4 - 40.9 mg/dL   GFR calc non Af Amer 64 (*) >90 mL/min   GFR calc Af Amer 74 (*) >90 mL/min  GLUCOSE, CAPILLARY      Component Value Range   Glucose-Capillary 177 (*) 70 - 99 mg/dL   Comment 1 Notify RN     Comment 2 Documented in Chart    GLUCOSE, CAPILLARY      Component Value Range   Glucose-Capillary 171 (*) 70 - 99 mg/dL  GLUCOSE, CAPILLARY      Component Value Range   Glucose-Capillary 225 (*) 70 - 99 mg/dL   Comment 1 Notify RN    GLUCOSE, CAPILLARY      Component Value Range   Glucose-Capillary 223 (*) 70 - 99 mg/dL  GLUCOSE, CAPILLARY      Component Value Range   Glucose-Capillary 220 (*) 70 - 99 mg/dL   Comment 1 Notify RN      Discharge Medications:     Medication List     As of 12/25/2011  7:56 AM    TAKE these medications         aspirin EC 81 MG tablet   Take 81 mg by mouth daily.      diltiazem 120 MG 24 hr capsule   Commonly known as: DILACOR XR   Take 120 mg by mouth daily.      fish oil-omega-3 fatty acids 1000 MG capsule   Take 2 g by mouth daily.      insulin detemir 100 UNIT/ML injection   Commonly known as: LEVEMIR   Inject 20  Units into the skin daily.      insulin lispro 100 UNIT/ML injection   Commonly known as: HUMALOG   Inject 4 Units into the skin 3 (three) times daily before meals.      pravastatin 40 MG tablet   Commonly known as: PRAVACHOL   Take 40 mg by mouth at bedtime.      quinapril 20 MG tablet   Commonly known as: ACCUPRIL   Take 20 mg by mouth 2 (two) times daily.      traMADol 50 MG tablet   Commonly known as: ULTRAM   Take 50 mg by mouth 3 (three) times daily.        Diagnostic Studies: Dg Chest 2 View  11/27/2011  *RADIOLOGY REPORT*  Clinical Data: Coronary artery disease, hypertension.  CHEST - 2 VIEW  Comparison: None.  Findings: Cardiomediastinal silhouette appears normal.  No acute pulmonary disease is noted.  Bony thorax is intact.   IMPRESSION: No acute cardiopulmonary abnormality seen.  Original Report Authenticated By: Venita Sheffield., M.D.   Dg Toe 2nd Left  11/27/2011  *RADIOLOGY REPORT*  Clinical Data: Diabetic foot ulcer.  Rule out osteomyelitis  LEFT SECOND TOE  Comparison: None.  Findings: Soft tissue swelling of the distal second toe.  There is erosion of the tuft of the distal second phalanx compatible with osteomyelitis.  Amputation of the great toe.  IMPRESSION: Osteomyelitis of the distal second phalanx.  Original Report Authenticated By: Camelia Phenes, M.D.    Disposition: 01-Home or Self Care      Discharge Orders    Future Orders Please Complete By Expires   Diet - low sodium heart healthy      Call MD / Call 911      Comments:   If you experience chest pain or shortness of breath, CALL 911 and be transported to the hospital emergency room.  If you develope a fever above 101 F, pus (white drainage) or increased drainage or redness at the wound, or calf pain, call your surgeon's office.   Constipation Prevention      Comments:   Drink plenty of fluids.  Prune juice may be helpful.  You may use a stool softener, such as Colace (over the counter) 100 mg twice a day.  Use MiraLax (over the counter) for constipation as needed.   Increase activity slowly as tolerated      Discharge instructions      Comments:   Keep splint dry  Ok to weight bear on heel - return to [facility] next week         Signed: DEAN,GREGORY SCOTT 12/25/2011, 7:56 AM

## 2012-01-02 DIAGNOSIS — E119 Type 2 diabetes mellitus without complications: Secondary | ICD-10-CM | POA: Diagnosis not present

## 2012-01-10 DIAGNOSIS — E785 Hyperlipidemia, unspecified: Secondary | ICD-10-CM | POA: Diagnosis not present

## 2012-01-10 DIAGNOSIS — I10 Essential (primary) hypertension: Secondary | ICD-10-CM | POA: Diagnosis not present

## 2012-04-12 DIAGNOSIS — E119 Type 2 diabetes mellitus without complications: Secondary | ICD-10-CM | POA: Diagnosis not present

## 2012-04-17 DIAGNOSIS — E119 Type 2 diabetes mellitus without complications: Secondary | ICD-10-CM | POA: Diagnosis not present

## 2012-04-17 DIAGNOSIS — I10 Essential (primary) hypertension: Secondary | ICD-10-CM | POA: Diagnosis not present

## 2012-04-17 DIAGNOSIS — Z23 Encounter for immunization: Secondary | ICD-10-CM | POA: Diagnosis not present

## 2012-05-15 DIAGNOSIS — E291 Testicular hypofunction: Secondary | ICD-10-CM | POA: Diagnosis not present

## 2012-05-15 DIAGNOSIS — I10 Essential (primary) hypertension: Secondary | ICD-10-CM | POA: Diagnosis not present

## 2012-05-15 DIAGNOSIS — E119 Type 2 diabetes mellitus without complications: Secondary | ICD-10-CM | POA: Diagnosis not present

## 2012-05-15 DIAGNOSIS — Z23 Encounter for immunization: Secondary | ICD-10-CM | POA: Diagnosis not present

## 2012-05-15 DIAGNOSIS — E7800 Pure hypercholesterolemia, unspecified: Secondary | ICD-10-CM | POA: Diagnosis not present

## 2012-06-22 ENCOUNTER — Other Ambulatory Visit: Payer: Self-pay | Admitting: Gastroenterology

## 2012-06-22 DIAGNOSIS — D126 Benign neoplasm of colon, unspecified: Secondary | ICD-10-CM | POA: Diagnosis not present

## 2012-07-12 DIAGNOSIS — E291 Testicular hypofunction: Secondary | ICD-10-CM | POA: Diagnosis not present

## 2012-07-12 DIAGNOSIS — E119 Type 2 diabetes mellitus without complications: Secondary | ICD-10-CM | POA: Diagnosis not present

## 2012-07-12 DIAGNOSIS — I10 Essential (primary) hypertension: Secondary | ICD-10-CM | POA: Diagnosis not present

## 2012-07-17 DIAGNOSIS — E7800 Pure hypercholesterolemia, unspecified: Secondary | ICD-10-CM | POA: Diagnosis not present

## 2012-07-17 DIAGNOSIS — I10 Essential (primary) hypertension: Secondary | ICD-10-CM | POA: Diagnosis not present

## 2012-07-17 DIAGNOSIS — E119 Type 2 diabetes mellitus without complications: Secondary | ICD-10-CM | POA: Diagnosis not present

## 2012-07-17 DIAGNOSIS — E291 Testicular hypofunction: Secondary | ICD-10-CM | POA: Diagnosis not present

## 2012-08-16 DIAGNOSIS — H04129 Dry eye syndrome of unspecified lacrimal gland: Secondary | ICD-10-CM | POA: Diagnosis not present

## 2012-08-16 DIAGNOSIS — H40009 Preglaucoma, unspecified, unspecified eye: Secondary | ICD-10-CM | POA: Diagnosis not present

## 2012-08-16 DIAGNOSIS — E119 Type 2 diabetes mellitus without complications: Secondary | ICD-10-CM | POA: Diagnosis not present

## 2012-10-16 DIAGNOSIS — I10 Essential (primary) hypertension: Secondary | ICD-10-CM | POA: Diagnosis not present

## 2012-10-16 DIAGNOSIS — E119 Type 2 diabetes mellitus without complications: Secondary | ICD-10-CM | POA: Diagnosis not present

## 2012-10-16 DIAGNOSIS — E291 Testicular hypofunction: Secondary | ICD-10-CM | POA: Diagnosis not present

## 2012-10-16 DIAGNOSIS — R972 Elevated prostate specific antigen [PSA]: Secondary | ICD-10-CM | POA: Diagnosis not present

## 2012-10-23 DIAGNOSIS — I10 Essential (primary) hypertension: Secondary | ICD-10-CM | POA: Diagnosis not present

## 2012-10-23 DIAGNOSIS — E7800 Pure hypercholesterolemia, unspecified: Secondary | ICD-10-CM | POA: Diagnosis not present

## 2012-10-23 DIAGNOSIS — R972 Elevated prostate specific antigen [PSA]: Secondary | ICD-10-CM | POA: Diagnosis not present

## 2012-10-23 DIAGNOSIS — E119 Type 2 diabetes mellitus without complications: Secondary | ICD-10-CM | POA: Diagnosis not present

## 2012-12-03 IMAGING — CR DG CLAVICLE*R*
2 series · 2 of 2 positions shown · non-contrast
Comparison: None

CLINICAL DATA: Right clavicle pain.

RIGHT CLAVICLE - 2+ VIEWS

[t clavicle ap right]
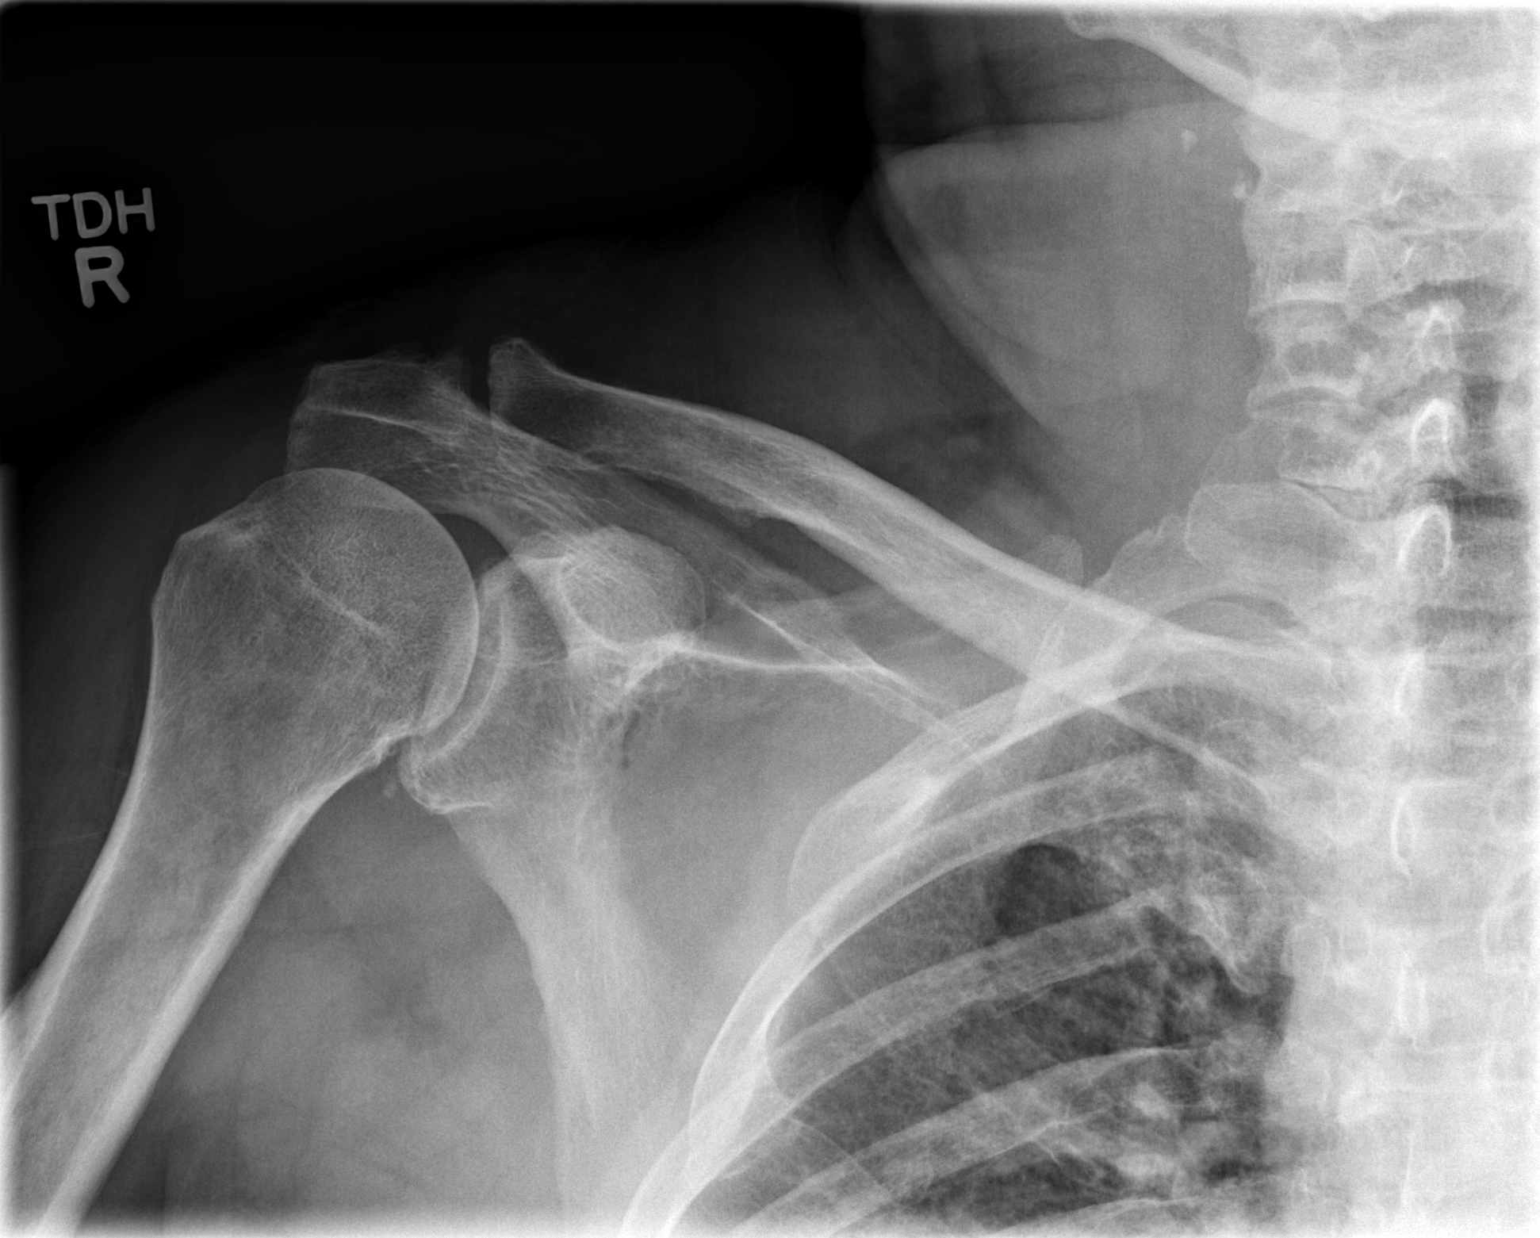

[t clavicle tangential right]
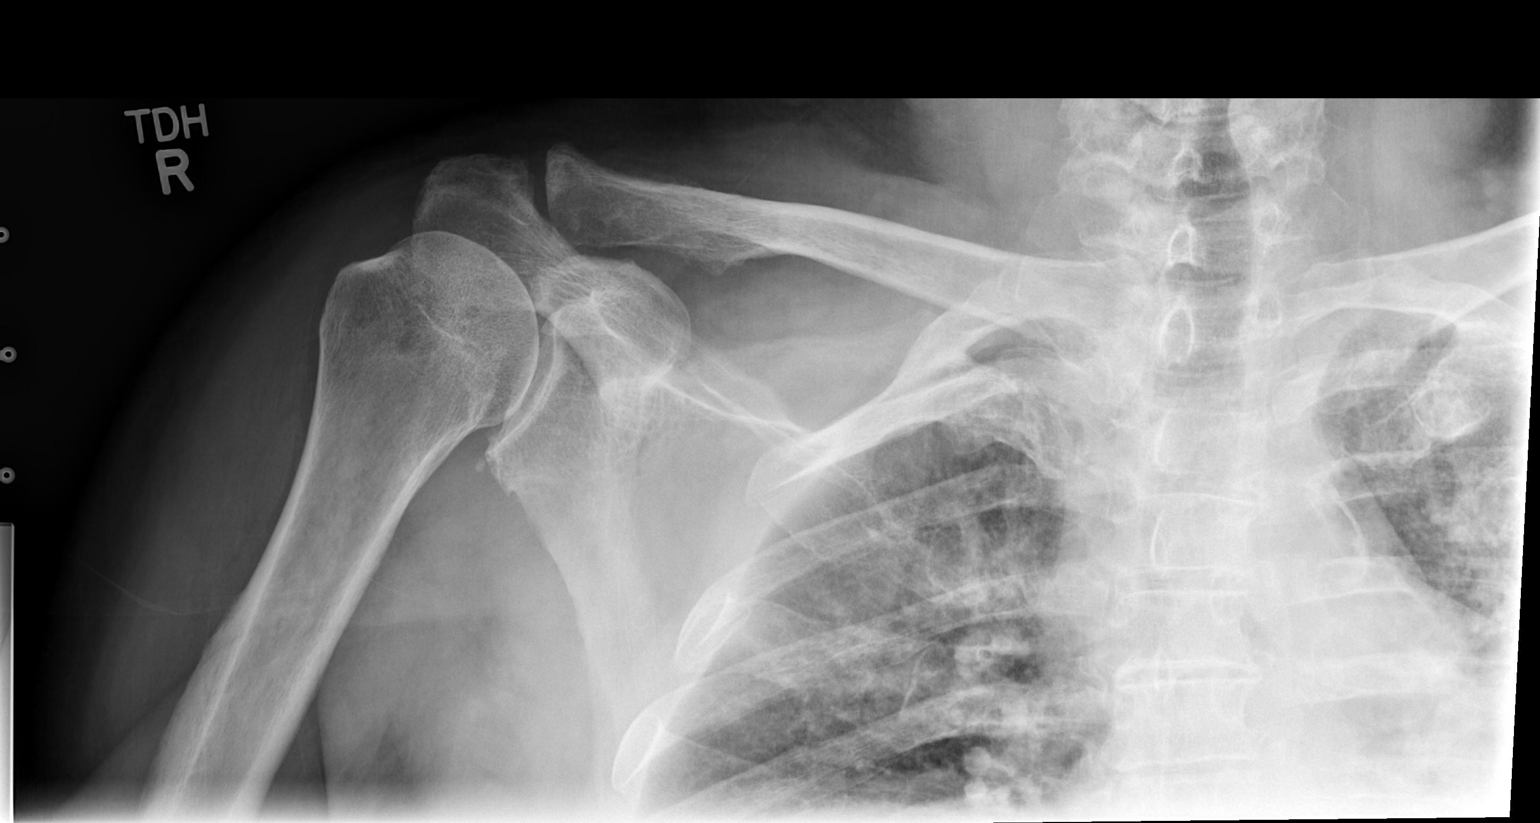

[2 of 2 positions shown; findings below may reference images not displayed]

FINDINGS: There is no evidence of fracture, subluxation or
dislocation.
Mild degenerative changes at the acromioclavicular and glenohumeral
joints noted.
No focal bony lesions are noted.
IMPRESSION: No evidence of acute bony abnormality.

Degenerative changes at the acromioclavicular and glenohumeral
joints.

## 2012-12-03 IMAGING — CR DG KNEE COMPLETE 4+V*R*
4 series · 4 of 4 positions shown · non-contrast
Comparison: None.

CLINICAL DATA: Knee pain and swelling.

RIGHT KNEE - COMPLETE 4+ VIEW

[t knee ap right]
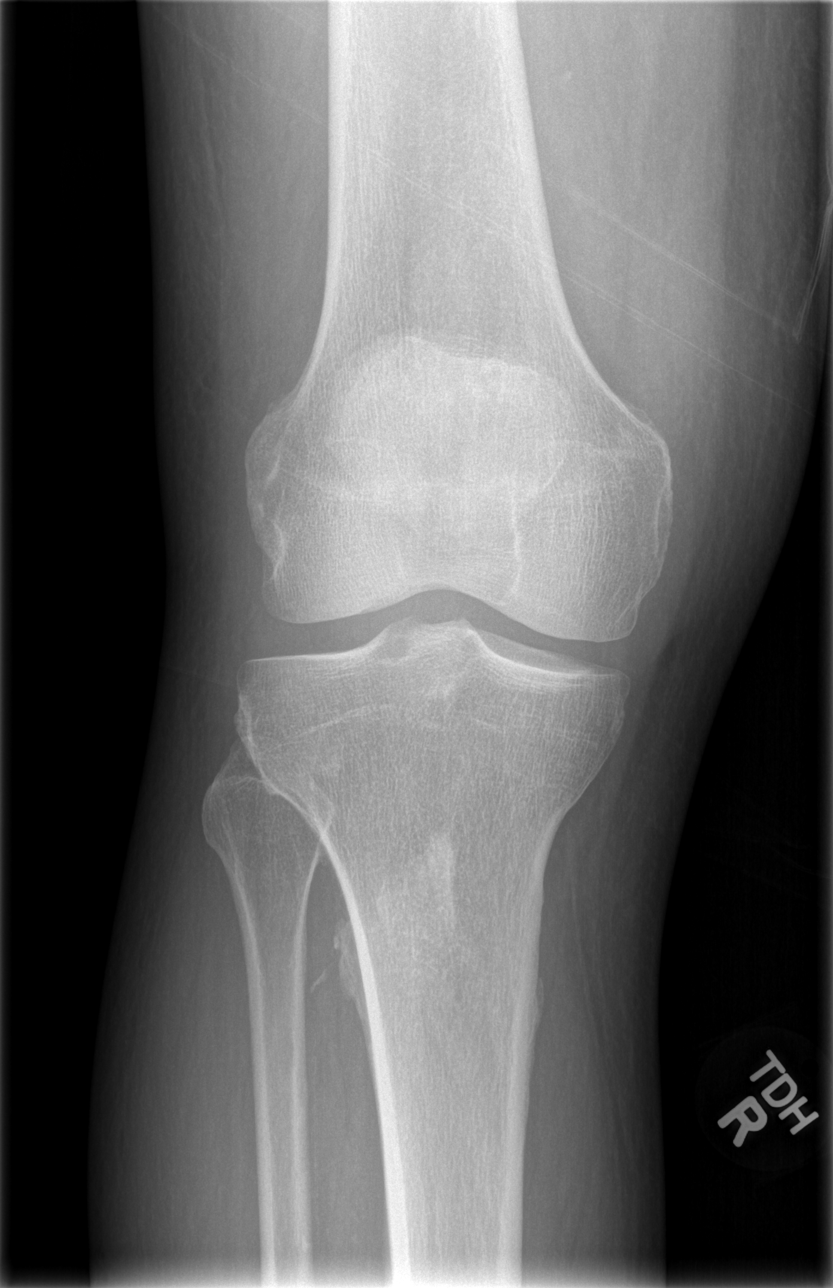

[t knee oblique right (1 of 2)]
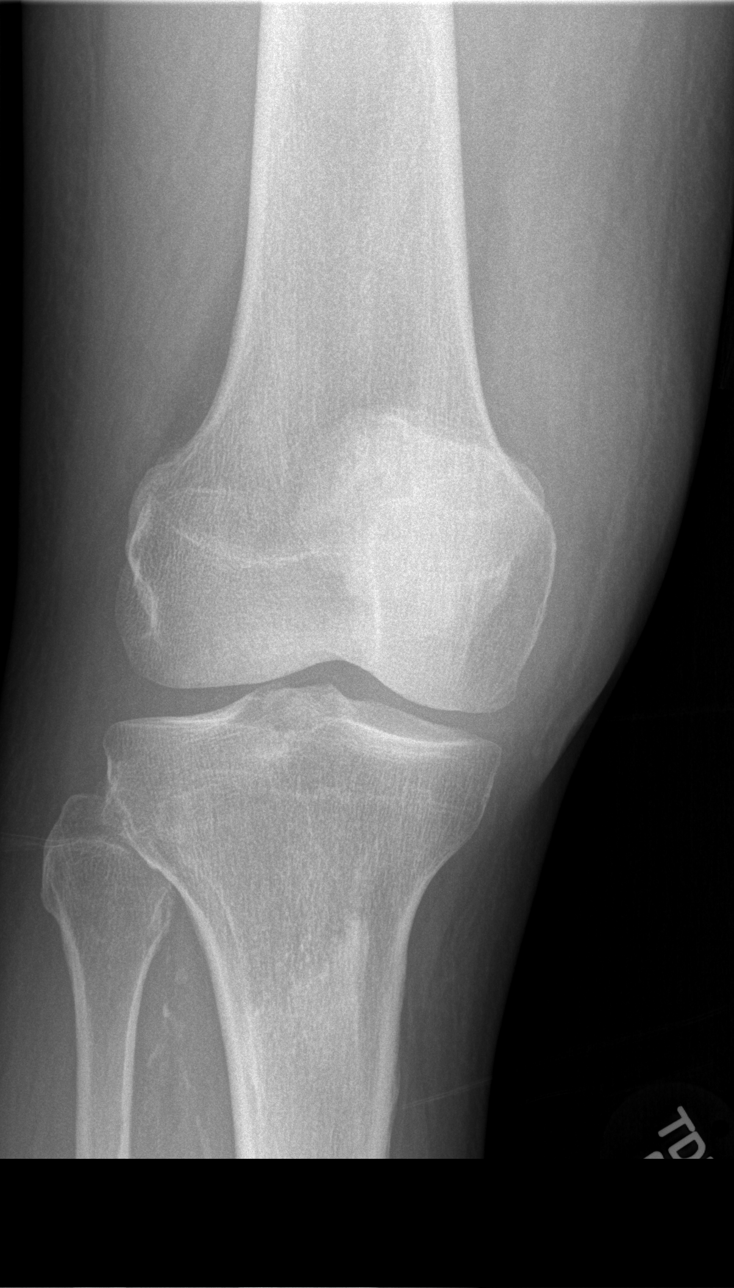

[t knee oblique right (2 of 2)]
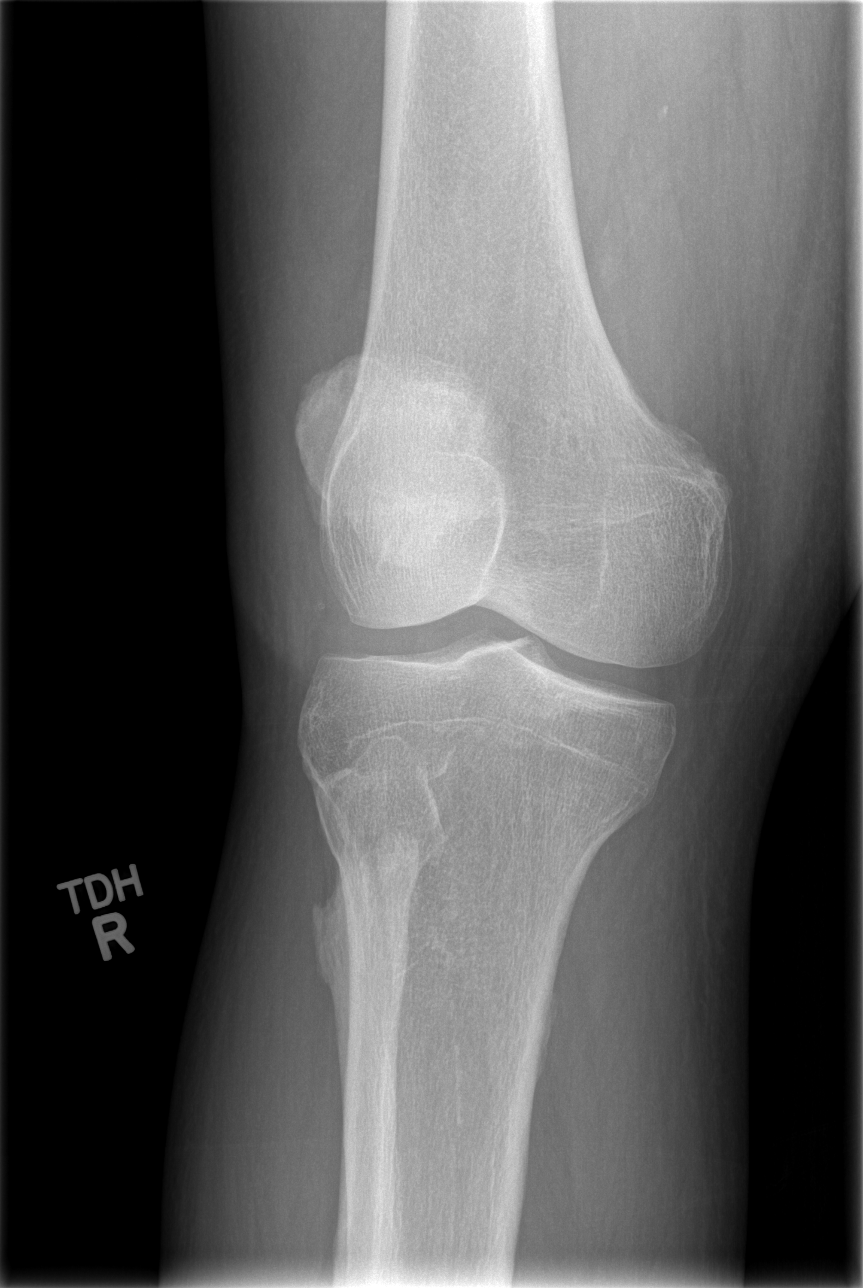

[t knee lat right]
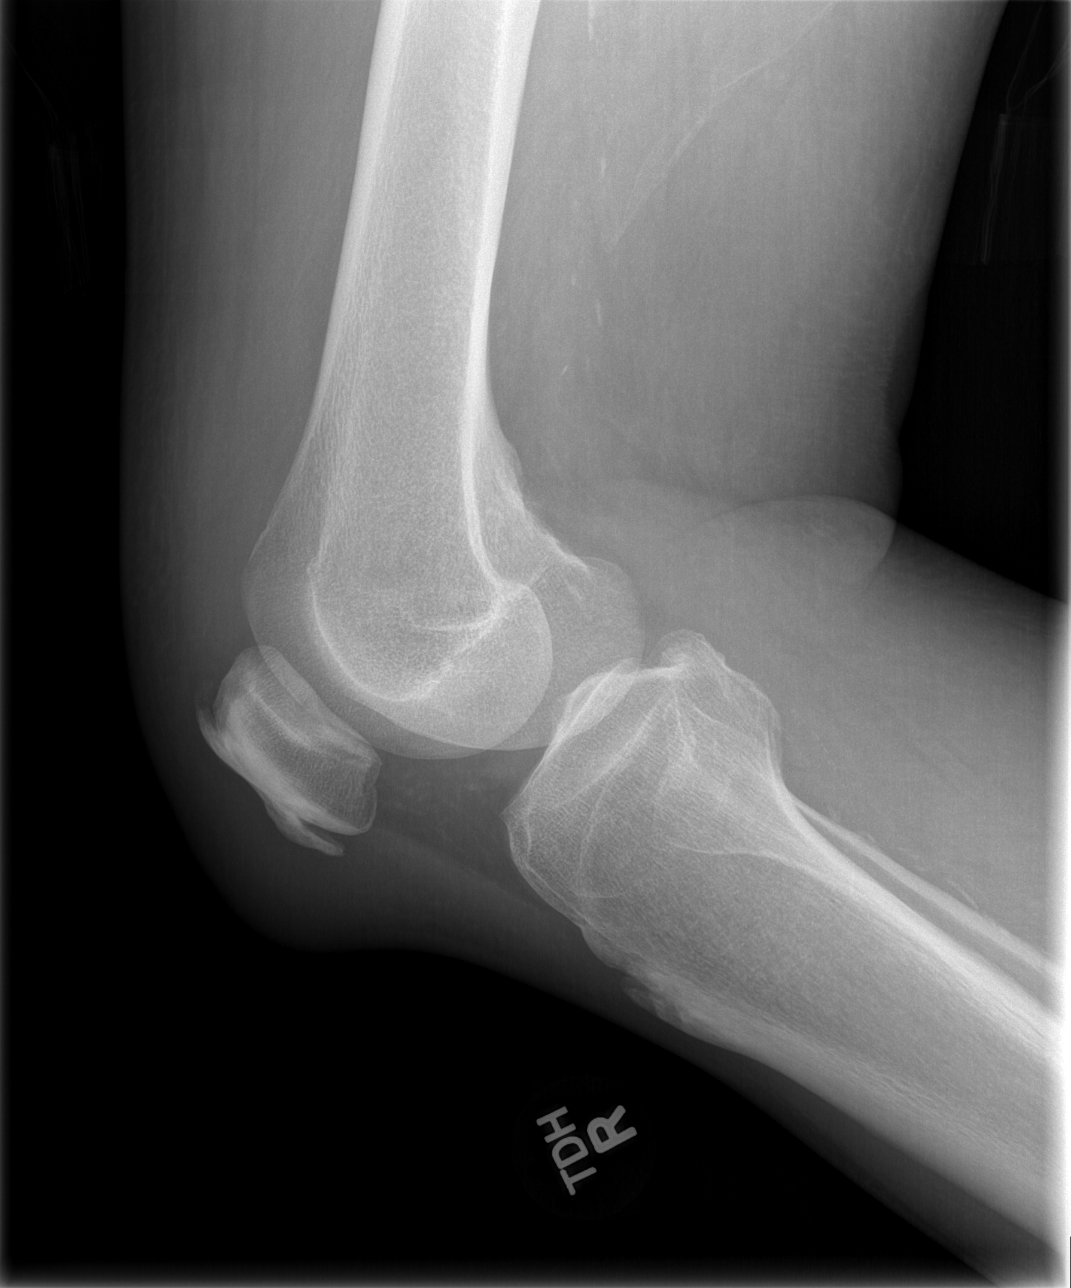

[4 of 4 positions shown; findings below may reference images not displayed]

FINDINGS: No evidence for acute fracture.  No subluxation or
dislocation.  No joint effusion. Prepatellar soft tissue swelling
is evident.
IMPRESSION: No acute bony findings.

## 2013-01-11 DIAGNOSIS — E291 Testicular hypofunction: Secondary | ICD-10-CM | POA: Diagnosis not present

## 2013-01-11 DIAGNOSIS — E119 Type 2 diabetes mellitus without complications: Secondary | ICD-10-CM | POA: Diagnosis not present

## 2013-01-11 DIAGNOSIS — I10 Essential (primary) hypertension: Secondary | ICD-10-CM | POA: Diagnosis not present

## 2013-01-11 DIAGNOSIS — E7800 Pure hypercholesterolemia, unspecified: Secondary | ICD-10-CM | POA: Diagnosis not present

## 2013-01-14 DIAGNOSIS — E7800 Pure hypercholesterolemia, unspecified: Secondary | ICD-10-CM | POA: Diagnosis not present

## 2013-01-14 DIAGNOSIS — Z23 Encounter for immunization: Secondary | ICD-10-CM | POA: Diagnosis not present

## 2013-01-14 DIAGNOSIS — E119 Type 2 diabetes mellitus without complications: Secondary | ICD-10-CM | POA: Diagnosis not present

## 2013-01-14 DIAGNOSIS — I10 Essential (primary) hypertension: Secondary | ICD-10-CM | POA: Diagnosis not present

## 2013-04-17 DIAGNOSIS — E119 Type 2 diabetes mellitus without complications: Secondary | ICD-10-CM | POA: Diagnosis not present

## 2013-04-17 DIAGNOSIS — I10 Essential (primary) hypertension: Secondary | ICD-10-CM | POA: Diagnosis not present

## 2013-04-24 DIAGNOSIS — E119 Type 2 diabetes mellitus without complications: Secondary | ICD-10-CM | POA: Diagnosis not present

## 2013-07-23 DIAGNOSIS — E7800 Pure hypercholesterolemia, unspecified: Secondary | ICD-10-CM | POA: Diagnosis not present

## 2013-07-23 DIAGNOSIS — E119 Type 2 diabetes mellitus without complications: Secondary | ICD-10-CM | POA: Diagnosis not present

## 2013-07-26 DIAGNOSIS — E7800 Pure hypercholesterolemia, unspecified: Secondary | ICD-10-CM | POA: Diagnosis not present

## 2013-07-26 DIAGNOSIS — E119 Type 2 diabetes mellitus without complications: Secondary | ICD-10-CM | POA: Diagnosis not present

## 2013-07-26 DIAGNOSIS — I10 Essential (primary) hypertension: Secondary | ICD-10-CM | POA: Diagnosis not present

## 2013-08-16 DIAGNOSIS — M79609 Pain in unspecified limb: Secondary | ICD-10-CM | POA: Diagnosis not present

## 2013-08-16 DIAGNOSIS — E7800 Pure hypercholesterolemia, unspecified: Secondary | ICD-10-CM | POA: Diagnosis not present

## 2013-08-16 DIAGNOSIS — I10 Essential (primary) hypertension: Secondary | ICD-10-CM | POA: Diagnosis not present

## 2013-08-16 DIAGNOSIS — E119 Type 2 diabetes mellitus without complications: Secondary | ICD-10-CM | POA: Diagnosis not present

## 2013-08-30 DIAGNOSIS — H2510 Age-related nuclear cataract, unspecified eye: Secondary | ICD-10-CM | POA: Diagnosis not present

## 2013-08-30 DIAGNOSIS — H01009 Unspecified blepharitis unspecified eye, unspecified eyelid: Secondary | ICD-10-CM | POA: Diagnosis not present

## 2013-08-30 DIAGNOSIS — H5200 Hypermetropia, unspecified eye: Secondary | ICD-10-CM | POA: Diagnosis not present

## 2013-08-30 DIAGNOSIS — H40009 Preglaucoma, unspecified, unspecified eye: Secondary | ICD-10-CM | POA: Diagnosis not present

## 2013-10-10 DIAGNOSIS — I10 Essential (primary) hypertension: Secondary | ICD-10-CM | POA: Diagnosis not present

## 2013-10-10 DIAGNOSIS — E291 Testicular hypofunction: Secondary | ICD-10-CM | POA: Diagnosis not present

## 2013-10-10 DIAGNOSIS — E119 Type 2 diabetes mellitus without complications: Secondary | ICD-10-CM | POA: Diagnosis not present

## 2013-10-17 DIAGNOSIS — N183 Chronic kidney disease, stage 3 unspecified: Secondary | ICD-10-CM | POA: Diagnosis not present

## 2013-10-17 DIAGNOSIS — I10 Essential (primary) hypertension: Secondary | ICD-10-CM | POA: Diagnosis not present

## 2013-10-17 DIAGNOSIS — E119 Type 2 diabetes mellitus without complications: Secondary | ICD-10-CM | POA: Diagnosis not present

## 2013-10-17 DIAGNOSIS — E7800 Pure hypercholesterolemia, unspecified: Secondary | ICD-10-CM | POA: Diagnosis not present

## 2013-11-11 DIAGNOSIS — I10 Essential (primary) hypertension: Secondary | ICD-10-CM | POA: Diagnosis not present

## 2013-11-14 DIAGNOSIS — E119 Type 2 diabetes mellitus without complications: Secondary | ICD-10-CM | POA: Diagnosis not present

## 2013-11-14 DIAGNOSIS — M129 Arthropathy, unspecified: Secondary | ICD-10-CM | POA: Diagnosis not present

## 2014-01-14 DIAGNOSIS — E119 Type 2 diabetes mellitus without complications: Secondary | ICD-10-CM | POA: Diagnosis not present

## 2014-01-14 DIAGNOSIS — E780 Pure hypercholesterolemia: Secondary | ICD-10-CM | POA: Diagnosis not present

## 2014-01-17 DIAGNOSIS — Z23 Encounter for immunization: Secondary | ICD-10-CM | POA: Diagnosis not present

## 2014-01-17 DIAGNOSIS — G5600 Carpal tunnel syndrome, unspecified upper limb: Secondary | ICD-10-CM | POA: Diagnosis not present

## 2014-01-22 DIAGNOSIS — R809 Proteinuria, unspecified: Secondary | ICD-10-CM | POA: Diagnosis not present

## 2014-01-22 DIAGNOSIS — E119 Type 2 diabetes mellitus without complications: Secondary | ICD-10-CM | POA: Diagnosis not present

## 2014-01-22 DIAGNOSIS — I10 Essential (primary) hypertension: Secondary | ICD-10-CM | POA: Diagnosis not present

## 2014-01-22 DIAGNOSIS — N183 Chronic kidney disease, stage 3 (moderate): Secondary | ICD-10-CM | POA: Diagnosis not present

## 2014-01-22 DIAGNOSIS — N390 Urinary tract infection, site not specified: Secondary | ICD-10-CM | POA: Diagnosis not present

## 2014-02-14 DIAGNOSIS — E119 Type 2 diabetes mellitus without complications: Secondary | ICD-10-CM | POA: Diagnosis not present

## 2014-02-14 DIAGNOSIS — I10 Essential (primary) hypertension: Secondary | ICD-10-CM | POA: Diagnosis not present

## 2014-02-14 DIAGNOSIS — E780 Pure hypercholesterolemia: Secondary | ICD-10-CM | POA: Diagnosis not present

## 2014-02-20 DIAGNOSIS — G5601 Carpal tunnel syndrome, right upper limb: Secondary | ICD-10-CM | POA: Diagnosis not present

## 2014-02-20 DIAGNOSIS — M25531 Pain in right wrist: Secondary | ICD-10-CM | POA: Diagnosis not present

## 2014-04-16 DIAGNOSIS — I10 Essential (primary) hypertension: Secondary | ICD-10-CM | POA: Diagnosis not present

## 2014-04-16 DIAGNOSIS — E118 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: Secondary | ICD-10-CM | POA: Diagnosis not present

## 2014-04-21 DIAGNOSIS — E780 Pure hypercholesterolemia: Secondary | ICD-10-CM | POA: Diagnosis not present

## 2014-04-21 DIAGNOSIS — I10 Essential (primary) hypertension: Secondary | ICD-10-CM | POA: Diagnosis not present

## 2014-04-21 DIAGNOSIS — E118 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: Secondary | ICD-10-CM | POA: Diagnosis not present

## 2014-04-21 DIAGNOSIS — G5600 Carpal tunnel syndrome, unspecified upper limb: Secondary | ICD-10-CM | POA: Diagnosis not present

## 2014-05-12 DIAGNOSIS — M79641 Pain in right hand: Secondary | ICD-10-CM | POA: Diagnosis not present

## 2014-05-12 DIAGNOSIS — G5601 Carpal tunnel syndrome, right upper limb: Secondary | ICD-10-CM | POA: Diagnosis not present

## 2014-06-02 DIAGNOSIS — I10 Essential (primary) hypertension: Secondary | ICD-10-CM | POA: Diagnosis not present

## 2014-06-02 DIAGNOSIS — E118 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: Secondary | ICD-10-CM | POA: Diagnosis not present

## 2014-06-13 DIAGNOSIS — G5601 Carpal tunnel syndrome, right upper limb: Secondary | ICD-10-CM | POA: Diagnosis not present

## 2014-06-13 DIAGNOSIS — M79641 Pain in right hand: Secondary | ICD-10-CM | POA: Diagnosis not present

## 2014-06-20 DIAGNOSIS — M6530 Trigger finger, unspecified finger: Secondary | ICD-10-CM | POA: Diagnosis not present

## 2014-06-20 DIAGNOSIS — M79641 Pain in right hand: Secondary | ICD-10-CM | POA: Diagnosis not present

## 2014-06-20 DIAGNOSIS — G5601 Carpal tunnel syndrome, right upper limb: Secondary | ICD-10-CM | POA: Diagnosis not present

## 2014-07-09 DIAGNOSIS — I10 Essential (primary) hypertension: Secondary | ICD-10-CM | POA: Diagnosis not present

## 2014-07-09 DIAGNOSIS — E118 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: Secondary | ICD-10-CM | POA: Diagnosis not present

## 2014-07-14 DIAGNOSIS — E119 Type 2 diabetes mellitus without complications: Secondary | ICD-10-CM | POA: Diagnosis not present

## 2014-07-15 DIAGNOSIS — G5601 Carpal tunnel syndrome, right upper limb: Secondary | ICD-10-CM | POA: Diagnosis not present

## 2014-07-15 DIAGNOSIS — G5622 Lesion of ulnar nerve, left upper limb: Secondary | ICD-10-CM | POA: Diagnosis not present

## 2014-07-15 DIAGNOSIS — M65841 Other synovitis and tenosynovitis, right hand: Secondary | ICD-10-CM | POA: Diagnosis not present

## 2014-07-15 DIAGNOSIS — M65331 Trigger finger, right middle finger: Secondary | ICD-10-CM | POA: Diagnosis not present

## 2014-07-15 DIAGNOSIS — G8918 Other acute postprocedural pain: Secondary | ICD-10-CM | POA: Diagnosis not present

## 2014-07-15 DIAGNOSIS — G5621 Lesion of ulnar nerve, right upper limb: Secondary | ICD-10-CM | POA: Diagnosis not present

## 2014-07-23 DIAGNOSIS — Z4789 Encounter for other orthopedic aftercare: Secondary | ICD-10-CM | POA: Diagnosis not present

## 2014-07-28 DIAGNOSIS — M25421 Effusion, right elbow: Secondary | ICD-10-CM | POA: Diagnosis not present

## 2014-07-28 DIAGNOSIS — M25521 Pain in right elbow: Secondary | ICD-10-CM | POA: Diagnosis not present

## 2014-08-08 DIAGNOSIS — E119 Type 2 diabetes mellitus without complications: Secondary | ICD-10-CM | POA: Diagnosis not present

## 2014-08-08 DIAGNOSIS — E780 Pure hypercholesterolemia: Secondary | ICD-10-CM | POA: Diagnosis not present

## 2014-08-08 DIAGNOSIS — I10 Essential (primary) hypertension: Secondary | ICD-10-CM | POA: Diagnosis not present

## 2014-08-11 DIAGNOSIS — M25521 Pain in right elbow: Secondary | ICD-10-CM | POA: Diagnosis not present

## 2014-08-11 DIAGNOSIS — M25421 Effusion, right elbow: Secondary | ICD-10-CM | POA: Diagnosis not present

## 2014-08-20 DIAGNOSIS — Z4789 Encounter for other orthopedic aftercare: Secondary | ICD-10-CM | POA: Diagnosis not present

## 2014-08-25 DIAGNOSIS — M25421 Effusion, right elbow: Secondary | ICD-10-CM | POA: Diagnosis not present

## 2014-08-25 DIAGNOSIS — M25521 Pain in right elbow: Secondary | ICD-10-CM | POA: Diagnosis not present

## 2014-09-17 DIAGNOSIS — H18413 Arcus senilis, bilateral: Secondary | ICD-10-CM | POA: Diagnosis not present

## 2014-09-17 DIAGNOSIS — H2511 Age-related nuclear cataract, right eye: Secondary | ICD-10-CM | POA: Diagnosis not present

## 2014-09-17 DIAGNOSIS — H52221 Regular astigmatism, right eye: Secondary | ICD-10-CM | POA: Diagnosis not present

## 2014-09-17 DIAGNOSIS — H11153 Pinguecula, bilateral: Secondary | ICD-10-CM | POA: Diagnosis not present

## 2014-09-17 DIAGNOSIS — Z794 Long term (current) use of insulin: Secondary | ICD-10-CM | POA: Diagnosis not present

## 2014-09-17 DIAGNOSIS — H25042 Posterior subcapsular polar age-related cataract, left eye: Secondary | ICD-10-CM | POA: Diagnosis not present

## 2014-09-17 DIAGNOSIS — H5201 Hypermetropia, right eye: Secondary | ICD-10-CM | POA: Diagnosis not present

## 2014-09-17 DIAGNOSIS — H25011 Cortical age-related cataract, right eye: Secondary | ICD-10-CM | POA: Diagnosis not present

## 2014-09-17 DIAGNOSIS — H04123 Dry eye syndrome of bilateral lacrimal glands: Secondary | ICD-10-CM | POA: Diagnosis not present

## 2014-10-02 DIAGNOSIS — I10 Essential (primary) hypertension: Secondary | ICD-10-CM | POA: Diagnosis not present

## 2014-10-02 DIAGNOSIS — E119 Type 2 diabetes mellitus without complications: Secondary | ICD-10-CM | POA: Diagnosis not present

## 2014-10-08 DIAGNOSIS — E119 Type 2 diabetes mellitus without complications: Secondary | ICD-10-CM | POA: Diagnosis not present

## 2014-10-08 DIAGNOSIS — E780 Pure hypercholesterolemia: Secondary | ICD-10-CM | POA: Diagnosis not present

## 2014-10-08 DIAGNOSIS — I10 Essential (primary) hypertension: Secondary | ICD-10-CM | POA: Diagnosis not present

## 2014-10-28 DIAGNOSIS — H18411 Arcus senilis, right eye: Secondary | ICD-10-CM | POA: Diagnosis not present

## 2014-10-28 DIAGNOSIS — H2511 Age-related nuclear cataract, right eye: Secondary | ICD-10-CM | POA: Diagnosis not present

## 2014-10-28 DIAGNOSIS — E11359 Type 2 diabetes mellitus with proliferative diabetic retinopathy without macular edema: Secondary | ICD-10-CM | POA: Diagnosis not present

## 2014-10-28 DIAGNOSIS — H2512 Age-related nuclear cataract, left eye: Secondary | ICD-10-CM | POA: Diagnosis not present

## 2014-10-28 DIAGNOSIS — H02839 Dermatochalasis of unspecified eye, unspecified eyelid: Secondary | ICD-10-CM | POA: Diagnosis not present

## 2014-11-12 DIAGNOSIS — E119 Type 2 diabetes mellitus without complications: Secondary | ICD-10-CM | POA: Diagnosis not present

## 2014-11-17 DIAGNOSIS — H25811 Combined forms of age-related cataract, right eye: Secondary | ICD-10-CM | POA: Diagnosis not present

## 2014-11-17 DIAGNOSIS — H2511 Age-related nuclear cataract, right eye: Secondary | ICD-10-CM | POA: Diagnosis not present

## 2014-11-18 DIAGNOSIS — H2512 Age-related nuclear cataract, left eye: Secondary | ICD-10-CM | POA: Diagnosis not present

## 2014-11-18 DIAGNOSIS — H25012 Cortical age-related cataract, left eye: Secondary | ICD-10-CM | POA: Diagnosis not present

## 2014-11-25 DIAGNOSIS — E119 Type 2 diabetes mellitus without complications: Secondary | ICD-10-CM | POA: Diagnosis not present

## 2014-11-25 DIAGNOSIS — I10 Essential (primary) hypertension: Secondary | ICD-10-CM | POA: Diagnosis not present

## 2014-11-25 DIAGNOSIS — E780 Pure hypercholesterolemia: Secondary | ICD-10-CM | POA: Diagnosis not present

## 2014-12-08 DIAGNOSIS — H2512 Age-related nuclear cataract, left eye: Secondary | ICD-10-CM | POA: Diagnosis not present

## 2014-12-08 DIAGNOSIS — H25812 Combined forms of age-related cataract, left eye: Secondary | ICD-10-CM | POA: Diagnosis not present

## 2015-01-05 DIAGNOSIS — I10 Essential (primary) hypertension: Secondary | ICD-10-CM | POA: Diagnosis not present

## 2015-01-05 DIAGNOSIS — E119 Type 2 diabetes mellitus without complications: Secondary | ICD-10-CM | POA: Diagnosis not present

## 2015-01-09 DIAGNOSIS — E119 Type 2 diabetes mellitus without complications: Secondary | ICD-10-CM | POA: Diagnosis not present

## 2015-05-18 DIAGNOSIS — E7800 Pure hypercholesterolemia, unspecified: Secondary | ICD-10-CM | POA: Diagnosis not present

## 2015-05-18 DIAGNOSIS — N390 Urinary tract infection, site not specified: Secondary | ICD-10-CM | POA: Diagnosis not present

## 2015-05-18 DIAGNOSIS — R972 Elevated prostate specific antigen [PSA]: Secondary | ICD-10-CM | POA: Diagnosis not present

## 2015-05-18 DIAGNOSIS — E119 Type 2 diabetes mellitus without complications: Secondary | ICD-10-CM | POA: Diagnosis not present

## 2015-05-21 DIAGNOSIS — E7801 Familial hypercholesterolemia: Secondary | ICD-10-CM | POA: Diagnosis not present

## 2015-05-21 DIAGNOSIS — E119 Type 2 diabetes mellitus without complications: Secondary | ICD-10-CM | POA: Diagnosis not present

## 2015-05-21 DIAGNOSIS — I10 Essential (primary) hypertension: Secondary | ICD-10-CM | POA: Diagnosis not present

## 2015-06-15 DIAGNOSIS — E119 Type 2 diabetes mellitus without complications: Secondary | ICD-10-CM | POA: Diagnosis not present

## 2015-06-18 DIAGNOSIS — E119 Type 2 diabetes mellitus without complications: Secondary | ICD-10-CM | POA: Diagnosis not present

## 2015-08-13 DIAGNOSIS — E119 Type 2 diabetes mellitus without complications: Secondary | ICD-10-CM | POA: Diagnosis not present

## 2015-08-13 DIAGNOSIS — E7800 Pure hypercholesterolemia, unspecified: Secondary | ICD-10-CM | POA: Diagnosis not present

## 2015-08-19 DIAGNOSIS — I10 Essential (primary) hypertension: Secondary | ICD-10-CM | POA: Diagnosis not present

## 2015-08-19 DIAGNOSIS — E7800 Pure hypercholesterolemia, unspecified: Secondary | ICD-10-CM | POA: Diagnosis not present

## 2015-08-19 DIAGNOSIS — E119 Type 2 diabetes mellitus without complications: Secondary | ICD-10-CM | POA: Diagnosis not present

## 2015-09-02 DIAGNOSIS — E119 Type 2 diabetes mellitus without complications: Secondary | ICD-10-CM | POA: Diagnosis not present

## 2015-11-13 DIAGNOSIS — E119 Type 2 diabetes mellitus without complications: Secondary | ICD-10-CM | POA: Diagnosis not present

## 2015-11-25 DIAGNOSIS — I10 Essential (primary) hypertension: Secondary | ICD-10-CM | POA: Diagnosis not present

## 2015-11-25 DIAGNOSIS — E119 Type 2 diabetes mellitus without complications: Secondary | ICD-10-CM | POA: Diagnosis not present

## 2015-11-25 DIAGNOSIS — E7800 Pure hypercholesterolemia, unspecified: Secondary | ICD-10-CM | POA: Diagnosis not present

## 2015-12-10 ENCOUNTER — Other Ambulatory Visit: Payer: Self-pay

## 2016-02-11 DIAGNOSIS — H9209 Otalgia, unspecified ear: Secondary | ICD-10-CM | POA: Diagnosis not present

## 2016-02-18 DIAGNOSIS — I10 Essential (primary) hypertension: Secondary | ICD-10-CM | POA: Diagnosis not present

## 2016-02-18 DIAGNOSIS — E119 Type 2 diabetes mellitus without complications: Secondary | ICD-10-CM | POA: Diagnosis not present

## 2016-03-09 ENCOUNTER — Other Ambulatory Visit: Payer: Self-pay | Admitting: Internal Medicine

## 2016-03-09 DIAGNOSIS — R519 Headache, unspecified: Secondary | ICD-10-CM

## 2016-03-09 DIAGNOSIS — E119 Type 2 diabetes mellitus without complications: Secondary | ICD-10-CM | POA: Diagnosis not present

## 2016-03-09 DIAGNOSIS — R51 Headache: Secondary | ICD-10-CM | POA: Diagnosis not present

## 2016-03-09 DIAGNOSIS — E7800 Pure hypercholesterolemia, unspecified: Secondary | ICD-10-CM | POA: Diagnosis not present

## 2016-03-11 ENCOUNTER — Ambulatory Visit
Admission: RE | Admit: 2016-03-11 | Discharge: 2016-03-11 | Disposition: A | Discharge status: Home / Self Care | Payer: Medicare Other | Source: Ambulatory Visit | Attending: Internal Medicine | Admitting: Internal Medicine

## 2016-03-11 DIAGNOSIS — R519 Headache, unspecified: Secondary | ICD-10-CM

## 2016-03-11 DIAGNOSIS — R51 Headache: Secondary | ICD-10-CM | POA: Diagnosis not present

## 2016-04-08 DIAGNOSIS — I10 Essential (primary) hypertension: Secondary | ICD-10-CM | POA: Diagnosis not present

## 2016-04-08 DIAGNOSIS — E119 Type 2 diabetes mellitus without complications: Secondary | ICD-10-CM | POA: Diagnosis not present

## 2016-04-08 DIAGNOSIS — Z23 Encounter for immunization: Secondary | ICD-10-CM | POA: Diagnosis not present

## 2016-04-08 DIAGNOSIS — R51 Headache: Secondary | ICD-10-CM | POA: Diagnosis not present

## 2016-06-01 DIAGNOSIS — E119 Type 2 diabetes mellitus without complications: Secondary | ICD-10-CM | POA: Diagnosis not present

## 2016-06-01 DIAGNOSIS — I10 Essential (primary) hypertension: Secondary | ICD-10-CM | POA: Diagnosis not present

## 2016-06-01 DIAGNOSIS — E7800 Pure hypercholesterolemia, unspecified: Secondary | ICD-10-CM | POA: Diagnosis not present

## 2016-06-07 DIAGNOSIS — Z0000 Encounter for general adult medical examination without abnormal findings: Secondary | ICD-10-CM | POA: Diagnosis not present

## 2016-06-07 DIAGNOSIS — I10 Essential (primary) hypertension: Secondary | ICD-10-CM | POA: Diagnosis not present

## 2016-06-07 DIAGNOSIS — Z794 Long term (current) use of insulin: Secondary | ICD-10-CM | POA: Diagnosis not present

## 2016-06-07 DIAGNOSIS — E118 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: Secondary | ICD-10-CM | POA: Diagnosis not present

## 2016-06-07 DIAGNOSIS — E7800 Pure hypercholesterolemia, unspecified: Secondary | ICD-10-CM | POA: Diagnosis not present

## 2016-07-26 DIAGNOSIS — E119 Type 2 diabetes mellitus without complications: Secondary | ICD-10-CM | POA: Diagnosis not present

## 2016-07-26 DIAGNOSIS — I10 Essential (primary) hypertension: Secondary | ICD-10-CM | POA: Diagnosis not present

## 2016-09-12 DIAGNOSIS — I10 Essential (primary) hypertension: Secondary | ICD-10-CM | POA: Diagnosis not present

## 2016-09-12 DIAGNOSIS — E119 Type 2 diabetes mellitus without complications: Secondary | ICD-10-CM | POA: Diagnosis not present

## 2016-09-19 DIAGNOSIS — R079 Chest pain, unspecified: Secondary | ICD-10-CM | POA: Diagnosis not present

## 2016-09-19 DIAGNOSIS — E119 Type 2 diabetes mellitus without complications: Secondary | ICD-10-CM | POA: Diagnosis not present

## 2016-10-03 DIAGNOSIS — E7800 Pure hypercholesterolemia, unspecified: Secondary | ICD-10-CM | POA: Diagnosis not present

## 2016-10-03 DIAGNOSIS — I739 Peripheral vascular disease, unspecified: Secondary | ICD-10-CM | POA: Diagnosis not present

## 2016-10-03 DIAGNOSIS — I10 Essential (primary) hypertension: Secondary | ICD-10-CM | POA: Diagnosis not present

## 2016-10-03 DIAGNOSIS — R0789 Other chest pain: Secondary | ICD-10-CM | POA: Diagnosis not present

## 2016-10-04 DIAGNOSIS — Z794 Long term (current) use of insulin: Secondary | ICD-10-CM | POA: Diagnosis not present

## 2016-10-04 DIAGNOSIS — E118 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: Secondary | ICD-10-CM | POA: Diagnosis not present

## 2016-10-10 DIAGNOSIS — I10 Essential (primary) hypertension: Secondary | ICD-10-CM | POA: Diagnosis not present

## 2016-10-10 DIAGNOSIS — R0789 Other chest pain: Secondary | ICD-10-CM | POA: Diagnosis not present

## 2016-10-13 DIAGNOSIS — I739 Peripheral vascular disease, unspecified: Secondary | ICD-10-CM | POA: Diagnosis not present

## 2016-10-24 DIAGNOSIS — R0789 Other chest pain: Secondary | ICD-10-CM | POA: Diagnosis not present

## 2016-10-24 DIAGNOSIS — E7800 Pure hypercholesterolemia, unspecified: Secondary | ICD-10-CM | POA: Diagnosis not present

## 2016-10-24 DIAGNOSIS — I10 Essential (primary) hypertension: Secondary | ICD-10-CM | POA: Diagnosis not present

## 2016-10-24 DIAGNOSIS — I739 Peripheral vascular disease, unspecified: Secondary | ICD-10-CM | POA: Diagnosis not present

## 2016-11-03 DIAGNOSIS — E118 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: Secondary | ICD-10-CM | POA: Diagnosis not present

## 2016-11-03 DIAGNOSIS — I739 Peripheral vascular disease, unspecified: Secondary | ICD-10-CM | POA: Diagnosis not present

## 2017-01-13 DIAGNOSIS — H40003 Preglaucoma, unspecified, bilateral: Secondary | ICD-10-CM | POA: Diagnosis not present

## 2017-01-13 DIAGNOSIS — E10319 Type 1 diabetes mellitus with unspecified diabetic retinopathy without macular edema: Secondary | ICD-10-CM | POA: Diagnosis not present

## 2017-01-13 DIAGNOSIS — H35041 Retinal micro-aneurysms, unspecified, right eye: Secondary | ICD-10-CM | POA: Diagnosis not present

## 2017-01-13 DIAGNOSIS — E103291 Type 1 diabetes mellitus with mild nonproliferative diabetic retinopathy without macular edema, right eye: Secondary | ICD-10-CM | POA: Diagnosis not present

## 2017-01-13 DIAGNOSIS — Z794 Long term (current) use of insulin: Secondary | ICD-10-CM | POA: Diagnosis not present

## 2017-01-13 DIAGNOSIS — H40013 Open angle with borderline findings, low risk, bilateral: Secondary | ICD-10-CM | POA: Diagnosis not present

## 2017-01-13 DIAGNOSIS — H53412 Scotoma involving central area, left eye: Secondary | ICD-10-CM | POA: Diagnosis not present

## 2017-01-13 DIAGNOSIS — H50012 Monocular esotropia, left eye: Secondary | ICD-10-CM | POA: Diagnosis not present

## 2017-01-13 DIAGNOSIS — H31012 Macula scars of posterior pole (postinflammatory) (post-traumatic), left eye: Secondary | ICD-10-CM | POA: Diagnosis not present

## 2017-01-13 DIAGNOSIS — E109 Type 1 diabetes mellitus without complications: Secondary | ICD-10-CM | POA: Diagnosis not present

## 2017-01-13 DIAGNOSIS — H538 Other visual disturbances: Secondary | ICD-10-CM | POA: Diagnosis not present

## 2017-01-13 DIAGNOSIS — Z7984 Long term (current) use of oral hypoglycemic drugs: Secondary | ICD-10-CM | POA: Diagnosis not present

## 2017-01-30 DIAGNOSIS — E118 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: Secondary | ICD-10-CM | POA: Diagnosis not present

## 2017-01-30 DIAGNOSIS — E7800 Pure hypercholesterolemia, unspecified: Secondary | ICD-10-CM | POA: Diagnosis not present

## 2017-02-02 DIAGNOSIS — H11153 Pinguecula, bilateral: Secondary | ICD-10-CM | POA: Diagnosis not present

## 2017-02-02 DIAGNOSIS — Z9849 Cataract extraction status, unspecified eye: Secondary | ICD-10-CM | POA: Diagnosis not present

## 2017-02-02 DIAGNOSIS — H18413 Arcus senilis, bilateral: Secondary | ICD-10-CM | POA: Diagnosis not present

## 2017-02-02 DIAGNOSIS — E11319 Type 2 diabetes mellitus with unspecified diabetic retinopathy without macular edema: Secondary | ICD-10-CM | POA: Diagnosis not present

## 2017-02-02 DIAGNOSIS — H04123 Dry eye syndrome of bilateral lacrimal glands: Secondary | ICD-10-CM | POA: Diagnosis not present

## 2017-02-02 DIAGNOSIS — Z961 Presence of intraocular lens: Secondary | ICD-10-CM | POA: Diagnosis not present

## 2017-02-02 DIAGNOSIS — H11423 Conjunctival edema, bilateral: Secondary | ICD-10-CM | POA: Diagnosis not present

## 2017-02-02 DIAGNOSIS — H40023 Open angle with borderline findings, high risk, bilateral: Secondary | ICD-10-CM | POA: Diagnosis not present

## 2017-02-02 DIAGNOSIS — H50012 Monocular esotropia, left eye: Secondary | ICD-10-CM | POA: Diagnosis not present

## 2017-02-02 DIAGNOSIS — H31012 Macula scars of posterior pole (postinflammatory) (post-traumatic), left eye: Secondary | ICD-10-CM | POA: Diagnosis not present

## 2017-02-02 DIAGNOSIS — E113293 Type 2 diabetes mellitus with mild nonproliferative diabetic retinopathy without macular edema, bilateral: Secondary | ICD-10-CM | POA: Diagnosis not present

## 2017-02-03 DIAGNOSIS — E1121 Type 2 diabetes mellitus with diabetic nephropathy: Secondary | ICD-10-CM | POA: Diagnosis not present

## 2017-02-03 DIAGNOSIS — Z0000 Encounter for general adult medical examination without abnormal findings: Secondary | ICD-10-CM | POA: Diagnosis not present

## 2017-02-03 DIAGNOSIS — N183 Chronic kidney disease, stage 3 (moderate): Secondary | ICD-10-CM | POA: Diagnosis not present

## 2017-02-03 DIAGNOSIS — Z794 Long term (current) use of insulin: Secondary | ICD-10-CM | POA: Diagnosis not present

## 2017-02-03 DIAGNOSIS — E118 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: Secondary | ICD-10-CM | POA: Diagnosis not present

## 2017-05-04 DIAGNOSIS — E1121 Type 2 diabetes mellitus with diabetic nephropathy: Secondary | ICD-10-CM | POA: Diagnosis not present

## 2017-05-04 DIAGNOSIS — N183 Chronic kidney disease, stage 3 (moderate): Secondary | ICD-10-CM | POA: Diagnosis not present

## 2017-05-04 DIAGNOSIS — Z125 Encounter for screening for malignant neoplasm of prostate: Secondary | ICD-10-CM | POA: Diagnosis not present

## 2017-05-04 DIAGNOSIS — E118 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: Secondary | ICD-10-CM | POA: Diagnosis not present

## 2017-05-04 DIAGNOSIS — I10 Essential (primary) hypertension: Secondary | ICD-10-CM | POA: Diagnosis not present

## 2017-05-04 DIAGNOSIS — N390 Urinary tract infection, site not specified: Secondary | ICD-10-CM | POA: Diagnosis not present

## 2017-05-05 DIAGNOSIS — R0789 Other chest pain: Secondary | ICD-10-CM | POA: Diagnosis not present

## 2017-05-05 DIAGNOSIS — E7800 Pure hypercholesterolemia, unspecified: Secondary | ICD-10-CM | POA: Diagnosis not present

## 2017-05-05 DIAGNOSIS — I739 Peripheral vascular disease, unspecified: Secondary | ICD-10-CM | POA: Diagnosis not present

## 2017-05-05 DIAGNOSIS — I10 Essential (primary) hypertension: Secondary | ICD-10-CM | POA: Diagnosis not present

## 2017-05-12 DIAGNOSIS — N183 Chronic kidney disease, stage 3 (moderate): Secondary | ICD-10-CM | POA: Diagnosis not present

## 2017-05-12 DIAGNOSIS — E119 Type 2 diabetes mellitus without complications: Secondary | ICD-10-CM | POA: Diagnosis not present

## 2017-05-12 DIAGNOSIS — Z0000 Encounter for general adult medical examination without abnormal findings: Secondary | ICD-10-CM | POA: Diagnosis not present

## 2017-05-12 DIAGNOSIS — E7800 Pure hypercholesterolemia, unspecified: Secondary | ICD-10-CM | POA: Diagnosis not present

## 2017-08-07 DIAGNOSIS — H18413 Arcus senilis, bilateral: Secondary | ICD-10-CM | POA: Diagnosis not present

## 2017-08-07 DIAGNOSIS — Z961 Presence of intraocular lens: Secondary | ICD-10-CM | POA: Diagnosis not present

## 2017-08-07 DIAGNOSIS — Z7984 Long term (current) use of oral hypoglycemic drugs: Secondary | ICD-10-CM | POA: Diagnosis not present

## 2017-08-07 DIAGNOSIS — Z794 Long term (current) use of insulin: Secondary | ICD-10-CM | POA: Diagnosis not present

## 2017-08-07 DIAGNOSIS — H35041 Retinal micro-aneurysms, unspecified, right eye: Secondary | ICD-10-CM | POA: Diagnosis not present

## 2017-08-07 DIAGNOSIS — Z9849 Cataract extraction status, unspecified eye: Secondary | ICD-10-CM | POA: Diagnosis not present

## 2017-08-07 DIAGNOSIS — H40003 Preglaucoma, unspecified, bilateral: Secondary | ICD-10-CM | POA: Diagnosis not present

## 2017-08-07 DIAGNOSIS — H11153 Pinguecula, bilateral: Secondary | ICD-10-CM | POA: Diagnosis not present

## 2017-08-07 DIAGNOSIS — E11319 Type 2 diabetes mellitus with unspecified diabetic retinopathy without macular edema: Secondary | ICD-10-CM | POA: Diagnosis not present

## 2017-08-07 DIAGNOSIS — E113291 Type 2 diabetes mellitus with mild nonproliferative diabetic retinopathy without macular edema, right eye: Secondary | ICD-10-CM | POA: Diagnosis not present

## 2017-08-07 DIAGNOSIS — H04123 Dry eye syndrome of bilateral lacrimal glands: Secondary | ICD-10-CM | POA: Diagnosis not present

## 2017-08-10 DIAGNOSIS — E119 Type 2 diabetes mellitus without complications: Secondary | ICD-10-CM | POA: Diagnosis not present

## 2017-08-10 DIAGNOSIS — E118 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: Secondary | ICD-10-CM | POA: Diagnosis not present

## 2017-08-10 DIAGNOSIS — E7800 Pure hypercholesterolemia, unspecified: Secondary | ICD-10-CM | POA: Diagnosis not present

## 2017-08-17 DIAGNOSIS — E7800 Pure hypercholesterolemia, unspecified: Secondary | ICD-10-CM | POA: Diagnosis not present

## 2017-08-17 DIAGNOSIS — I10 Essential (primary) hypertension: Secondary | ICD-10-CM | POA: Diagnosis not present

## 2017-08-17 DIAGNOSIS — E119 Type 2 diabetes mellitus without complications: Secondary | ICD-10-CM | POA: Diagnosis not present

## 2017-11-10 DIAGNOSIS — I10 Essential (primary) hypertension: Secondary | ICD-10-CM | POA: Diagnosis not present

## 2017-11-10 DIAGNOSIS — E119 Type 2 diabetes mellitus without complications: Secondary | ICD-10-CM | POA: Diagnosis not present

## 2017-11-17 DIAGNOSIS — I10 Essential (primary) hypertension: Secondary | ICD-10-CM | POA: Diagnosis not present

## 2017-11-17 DIAGNOSIS — Z0000 Encounter for general adult medical examination without abnormal findings: Secondary | ICD-10-CM | POA: Diagnosis not present

## 2017-11-17 DIAGNOSIS — E7800 Pure hypercholesterolemia, unspecified: Secondary | ICD-10-CM | POA: Diagnosis not present

## 2017-11-17 DIAGNOSIS — E119 Type 2 diabetes mellitus without complications: Secondary | ICD-10-CM | POA: Diagnosis not present

## 2018-01-17 DIAGNOSIS — H04123 Dry eye syndrome of bilateral lacrimal glands: Secondary | ICD-10-CM | POA: Diagnosis not present

## 2018-01-17 DIAGNOSIS — H0100A Unspecified blepharitis right eye, upper and lower eyelids: Secondary | ICD-10-CM | POA: Diagnosis not present

## 2018-01-17 DIAGNOSIS — H35033 Hypertensive retinopathy, bilateral: Secondary | ICD-10-CM | POA: Diagnosis not present

## 2018-01-17 DIAGNOSIS — H18413 Arcus senilis, bilateral: Secondary | ICD-10-CM | POA: Diagnosis not present

## 2018-01-17 DIAGNOSIS — H11153 Pinguecula, bilateral: Secondary | ICD-10-CM | POA: Diagnosis not present

## 2018-01-17 DIAGNOSIS — H40023 Open angle with borderline findings, high risk, bilateral: Secondary | ICD-10-CM | POA: Diagnosis not present

## 2018-01-17 DIAGNOSIS — H35041 Retinal micro-aneurysms, unspecified, right eye: Secondary | ICD-10-CM | POA: Diagnosis not present

## 2018-01-17 DIAGNOSIS — H0100B Unspecified blepharitis left eye, upper and lower eyelids: Secondary | ICD-10-CM | POA: Diagnosis not present

## 2018-01-17 DIAGNOSIS — Z961 Presence of intraocular lens: Secondary | ICD-10-CM | POA: Diagnosis not present

## 2018-01-17 DIAGNOSIS — H1045 Other chronic allergic conjunctivitis: Secondary | ICD-10-CM | POA: Diagnosis not present

## 2018-01-17 DIAGNOSIS — H11823 Conjunctivochalasis, bilateral: Secondary | ICD-10-CM | POA: Diagnosis not present

## 2018-01-17 DIAGNOSIS — E113291 Type 2 diabetes mellitus with mild nonproliferative diabetic retinopathy without macular edema, right eye: Secondary | ICD-10-CM | POA: Diagnosis not present

## 2018-02-12 DIAGNOSIS — I10 Essential (primary) hypertension: Secondary | ICD-10-CM | POA: Diagnosis not present

## 2018-02-12 DIAGNOSIS — E7800 Pure hypercholesterolemia, unspecified: Secondary | ICD-10-CM | POA: Diagnosis not present

## 2018-02-12 DIAGNOSIS — E119 Type 2 diabetes mellitus without complications: Secondary | ICD-10-CM | POA: Diagnosis not present

## 2018-02-16 DIAGNOSIS — E7800 Pure hypercholesterolemia, unspecified: Secondary | ICD-10-CM | POA: Diagnosis not present

## 2018-02-16 DIAGNOSIS — E119 Type 2 diabetes mellitus without complications: Secondary | ICD-10-CM | POA: Diagnosis not present

## 2018-02-16 DIAGNOSIS — I10 Essential (primary) hypertension: Secondary | ICD-10-CM | POA: Diagnosis not present

## 2018-05-17 DIAGNOSIS — I10 Essential (primary) hypertension: Secondary | ICD-10-CM | POA: Diagnosis not present

## 2018-05-17 DIAGNOSIS — E7800 Pure hypercholesterolemia, unspecified: Secondary | ICD-10-CM | POA: Diagnosis not present

## 2018-05-17 DIAGNOSIS — N390 Urinary tract infection, site not specified: Secondary | ICD-10-CM | POA: Diagnosis not present

## 2018-05-17 DIAGNOSIS — E119 Type 2 diabetes mellitus without complications: Secondary | ICD-10-CM | POA: Diagnosis not present

## 2018-05-24 DIAGNOSIS — E7800 Pure hypercholesterolemia, unspecified: Secondary | ICD-10-CM | POA: Diagnosis not present

## 2018-05-24 DIAGNOSIS — E119 Type 2 diabetes mellitus without complications: Secondary | ICD-10-CM | POA: Diagnosis not present

## 2018-05-24 DIAGNOSIS — I10 Essential (primary) hypertension: Secondary | ICD-10-CM | POA: Diagnosis not present

## 2018-05-24 DIAGNOSIS — Z0000 Encounter for general adult medical examination without abnormal findings: Secondary | ICD-10-CM | POA: Diagnosis not present

## 2018-08-15 DIAGNOSIS — I10 Essential (primary) hypertension: Secondary | ICD-10-CM | POA: Diagnosis not present

## 2018-08-15 DIAGNOSIS — N390 Urinary tract infection, site not specified: Secondary | ICD-10-CM | POA: Diagnosis not present

## 2018-08-15 DIAGNOSIS — E7800 Pure hypercholesterolemia, unspecified: Secondary | ICD-10-CM | POA: Diagnosis not present

## 2018-08-15 DIAGNOSIS — E119 Type 2 diabetes mellitus without complications: Secondary | ICD-10-CM | POA: Diagnosis not present

## 2018-08-22 DIAGNOSIS — E1121 Type 2 diabetes mellitus with diabetic nephropathy: Secondary | ICD-10-CM | POA: Diagnosis not present

## 2018-08-22 DIAGNOSIS — E119 Type 2 diabetes mellitus without complications: Secondary | ICD-10-CM | POA: Diagnosis not present

## 2018-08-22 DIAGNOSIS — Z794 Long term (current) use of insulin: Secondary | ICD-10-CM | POA: Diagnosis not present

## 2018-08-22 DIAGNOSIS — I739 Peripheral vascular disease, unspecified: Secondary | ICD-10-CM | POA: Diagnosis not present

## 2018-08-22 DIAGNOSIS — I10 Essential (primary) hypertension: Secondary | ICD-10-CM | POA: Diagnosis not present

## 2018-08-22 DIAGNOSIS — E7800 Pure hypercholesterolemia, unspecified: Secondary | ICD-10-CM | POA: Diagnosis not present

## 2018-08-22 DIAGNOSIS — N3001 Acute cystitis with hematuria: Secondary | ICD-10-CM | POA: Diagnosis not present

## 2018-11-15 DIAGNOSIS — I10 Essential (primary) hypertension: Secondary | ICD-10-CM | POA: Diagnosis not present

## 2018-11-15 DIAGNOSIS — R739 Hyperglycemia, unspecified: Secondary | ICD-10-CM | POA: Diagnosis not present

## 2018-11-15 DIAGNOSIS — E785 Hyperlipidemia, unspecified: Secondary | ICD-10-CM | POA: Diagnosis not present

## 2018-11-22 DIAGNOSIS — E785 Hyperlipidemia, unspecified: Secondary | ICD-10-CM | POA: Diagnosis not present

## 2018-11-22 DIAGNOSIS — E119 Type 2 diabetes mellitus without complications: Secondary | ICD-10-CM | POA: Diagnosis not present

## 2018-11-22 DIAGNOSIS — I10 Essential (primary) hypertension: Secondary | ICD-10-CM | POA: Diagnosis not present

## 2018-11-22 DIAGNOSIS — N183 Chronic kidney disease, stage 3 (moderate): Secondary | ICD-10-CM | POA: Diagnosis not present

## 2018-12-03 DIAGNOSIS — N183 Chronic kidney disease, stage 3 (moderate): Secondary | ICD-10-CM | POA: Diagnosis not present

## 2018-12-03 DIAGNOSIS — E785 Hyperlipidemia, unspecified: Secondary | ICD-10-CM | POA: Diagnosis not present

## 2018-12-03 DIAGNOSIS — E118 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: Secondary | ICD-10-CM | POA: Diagnosis not present

## 2018-12-03 DIAGNOSIS — E1121 Type 2 diabetes mellitus with diabetic nephropathy: Secondary | ICD-10-CM | POA: Diagnosis not present

## 2018-12-03 DIAGNOSIS — I10 Essential (primary) hypertension: Secondary | ICD-10-CM | POA: Diagnosis not present

## 2018-12-03 DIAGNOSIS — Z6830 Body mass index (BMI) 30.0-30.9, adult: Secondary | ICD-10-CM | POA: Diagnosis not present

## 2018-12-03 DIAGNOSIS — Z794 Long term (current) use of insulin: Secondary | ICD-10-CM | POA: Diagnosis not present

## 2018-12-19 DIAGNOSIS — Z6830 Body mass index (BMI) 30.0-30.9, adult: Secondary | ICD-10-CM | POA: Diagnosis not present

## 2018-12-19 DIAGNOSIS — E785 Hyperlipidemia, unspecified: Secondary | ICD-10-CM | POA: Diagnosis not present

## 2018-12-19 DIAGNOSIS — Z23 Encounter for immunization: Secondary | ICD-10-CM | POA: Diagnosis not present

## 2018-12-19 DIAGNOSIS — E118 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: Secondary | ICD-10-CM | POA: Diagnosis not present

## 2018-12-19 DIAGNOSIS — N183 Chronic kidney disease, stage 3 (moderate): Secondary | ICD-10-CM | POA: Diagnosis not present

## 2018-12-19 DIAGNOSIS — I10 Essential (primary) hypertension: Secondary | ICD-10-CM | POA: Diagnosis not present

## 2018-12-19 DIAGNOSIS — Z794 Long term (current) use of insulin: Secondary | ICD-10-CM | POA: Diagnosis not present

## 2018-12-19 DIAGNOSIS — E1121 Type 2 diabetes mellitus with diabetic nephropathy: Secondary | ICD-10-CM | POA: Diagnosis not present

## 2019-01-05 ENCOUNTER — Emergency Department (HOSPITAL_COMMUNITY): Payer: Medicare Other

## 2019-01-05 ENCOUNTER — Other Ambulatory Visit: Payer: Self-pay

## 2019-01-05 ENCOUNTER — Emergency Department (HOSPITAL_COMMUNITY)
Admission: EM | Admit: 2019-01-05 | Discharge: 2019-01-05 | Disposition: A | Discharge status: Home / Self Care | Payer: Medicare Other | Attending: Emergency Medicine | Admitting: Emergency Medicine

## 2019-01-05 ENCOUNTER — Encounter (HOSPITAL_COMMUNITY): Payer: Self-pay | Admitting: Emergency Medicine

## 2019-01-05 DIAGNOSIS — I10 Essential (primary) hypertension: Secondary | ICD-10-CM | POA: Diagnosis not present

## 2019-01-05 DIAGNOSIS — W010XXA Fall on same level from slipping, tripping and stumbling without subsequent striking against object, initial encounter: Secondary | ICD-10-CM | POA: Insufficient documentation

## 2019-01-05 DIAGNOSIS — S82042A Displaced comminuted fracture of left patella, initial encounter for closed fracture: Secondary | ICD-10-CM | POA: Diagnosis not present

## 2019-01-05 DIAGNOSIS — Z794 Long term (current) use of insulin: Secondary | ICD-10-CM | POA: Insufficient documentation

## 2019-01-05 DIAGNOSIS — Y999 Unspecified external cause status: Secondary | ICD-10-CM | POA: Insufficient documentation

## 2019-01-05 DIAGNOSIS — Z7982 Long term (current) use of aspirin: Secondary | ICD-10-CM | POA: Insufficient documentation

## 2019-01-05 DIAGNOSIS — R609 Edema, unspecified: Secondary | ICD-10-CM | POA: Diagnosis not present

## 2019-01-05 DIAGNOSIS — S8992XA Unspecified injury of left lower leg, initial encounter: Secondary | ICD-10-CM | POA: Diagnosis present

## 2019-01-05 DIAGNOSIS — Z79899 Other long term (current) drug therapy: Secondary | ICD-10-CM | POA: Insufficient documentation

## 2019-01-05 DIAGNOSIS — Y939 Activity, unspecified: Secondary | ICD-10-CM | POA: Insufficient documentation

## 2019-01-05 DIAGNOSIS — W19XXXA Unspecified fall, initial encounter: Secondary | ICD-10-CM | POA: Diagnosis not present

## 2019-01-05 DIAGNOSIS — Y92512 Supermarket, store or market as the place of occurrence of the external cause: Secondary | ICD-10-CM | POA: Diagnosis not present

## 2019-01-05 DIAGNOSIS — E119 Type 2 diabetes mellitus without complications: Secondary | ICD-10-CM | POA: Insufficient documentation

## 2019-01-05 MED ORDER — WALKER/TWO-BUTTON FOLDING MISC: 1.0000 [IU] | Freq: Once | 0 refills | Status: AC

## 2019-01-05 MED ORDER — ACETAMINOPHEN 325 MG PO TABS
650.0000 mg | ORAL_TABLET | Freq: Once | ORAL | Status: AC
  Administered 2019-01-05: 21:00:00 650 mg via ORAL
  Filled 2019-01-05: qty 2

## 2019-01-05 MED ORDER — BACITRACIN ZINC 500 UNIT/GM EX OINT
TOPICAL_OINTMENT | Freq: Two times a day (BID) | CUTANEOUS | Status: DC
  Administered 2019-01-05: 22:00:00 via TOPICAL

## 2019-01-05 NOTE — ED Notes (Signed)
Bacitracin ointment placed on left arm and redressed.

## 2019-01-05 NOTE — ED Notes (Signed)
Patient transported to CT 

## 2019-01-05 NOTE — Discharge Instructions (Signed)
Take Tylenol as needed as prescribed for pain. Weight bear as tolerated with a walker. Follow up with Dr. Erlinda Hong in the office next week, call on Monday to schedule an appointment.

## 2019-01-05 NOTE — ED Notes (Signed)
Patient transported to X-ray 

## 2019-01-05 NOTE — ED Triage Notes (Signed)
Pt BIB [facility] after a falling in a store tonight. Pain to left knee with movement, laceration to LUE, bleeding controlled at this time. Denies hitting his head or LOC, denies blood thinner use. EMS VSS.

## 2019-01-05 NOTE — ED Provider Notes (Signed)
[place] [facility] EMERGENCY DEPARTMENT Provider Note   CSN: [id number] Arrival date & time:        History   Chief Complaint Chief Complaint  Patient presents with   Fall   Knee Pain    HPI Zachary [name] is a 73 y.o. male.     73 year old male with past medical history of insulin-dependent diabetes and hypertension brought in by EMS for left knee pain from a fall.  Patient states that he was walking at the [facility] when he tripped over a rack on the ground and fell landing on his left knee.  Patient denies feeling weak or dizzy prior to his fall, did not hit his head or lose consciousness, is not on blood thinners.  Patient has a minor abrasion to his left upper arm and pain and swelling in his left knee.  Patient was not able to stand on his own after the fall due to pain in his knee.  Last tetanus is less than 5 years ago.  No other injuries, complaints, concerns.     Past Medical History:  Diagnosis Date   Diabetes mellitus    Hypertension     There are no active problems to display for this patient.   Past Surgical History:  Procedure Laterality Date   AMPUTATION     AMPUTATION  11/28/2011   Procedure: AMPUTATION RAY;  Surgeon: [name], MD;  Location: [facility];  Service: Orthopedics;  Laterality: Left;  left 2nd toe amputation        Home Medications    Prior to Admission medications   Medication Sig Start Date End Date Taking? Authorizing Provider  aspirin EC 81 MG tablet Take 81 mg by mouth daily.    [provider]  diltiazem (DILACOR XR) 120 MG 24 hr capsule Take 120 mg by mouth daily.    [provider]  fish oil-omega-3 fatty acids 1000 MG capsule Take 2 g by mouth daily.    [provider]  insulin detemir (LEVEMIR) 100 UNIT/ML injection Inject 20 Units into the skin daily.    [provider]  insulin lispro (HUMALOG) 100 UNIT/ML injection Inject 4 Units into the skin 3 (three) times  daily before meals.    [provider]  Misc. Devices (WALKER/TWO-BUTTON FOLDING) MISC 1 Units by Does not apply route once for 1 dose. 01/05/19 01/05/19  Tacy Learn, PA-C  pravastatin (PRAVACHOL) 40 MG tablet Take 40 mg by mouth at bedtime.    [provider]  quinapril (ACCUPRIL) 20 MG tablet Take 20 mg by mouth 2 (two) times daily.    [provider]  traMADol (ULTRAM) 50 MG tablet Take 50 mg by mouth 3 (three) times daily.    [provider]    Family History No family history on file.  Social History Social History   Tobacco Use   Smoking status: Never Smoker   Smokeless tobacco: Never Used  Substance Use Topics   Alcohol use: No   Drug use: No     Allergies   Patient has no known allergies.   Review of Systems Review of Systems  Constitutional: Negative for fever.  Musculoskeletal: Positive for arthralgias and joint swelling. Negative for back pain, neck pain and neck stiffness.  Skin: Positive for wound.  Allergic/Immunologic: Positive for immunocompromised state.  Neurological: Negative for dizziness and weakness.  Hematological: Does not bruise/bleed easily.  Psychiatric/Behavioral: Negative for confusion.  All other systems reviewed and are  negative.    Physical Exam Updated Vital Signs BP (!) 146/71    Pulse 87    Temp 98.4 F (36.9 C) (Oral)    Resp 16    SpO2 94%   Physical Exam Vitals signs and nursing note reviewed.  Constitutional:      General: He is not in acute distress.    Appearance: He is well-developed. He is not diaphoretic.  HENT:     Head: Normocephalic and atraumatic.  Eyes:     Pupils: Pupils are equal, round, and reactive to light.  Cardiovascular:     Pulses: Normal pulses.  Pulmonary:     Effort: Pulmonary effort is normal.  Musculoskeletal:        General: Swelling, tenderness and signs of injury present.     Right wrist: Normal.     Left wrist: Normal.     Right hip: Normal.      Left hip: Normal.     Right knee: Normal.     Left knee: He exhibits decreased range of motion, swelling and ecchymosis. He exhibits no deformity, no laceration and no erythema. Tenderness found. Patellar tendon tenderness noted.       Arms:       Legs:  Skin:    General: Skin is warm and dry.     Findings: No erythema or rash.  Neurological:     Mental Status: He is alert and oriented to person, place, and time.     Sensory: No sensory deficit.  Psychiatric:        Behavior: Behavior normal.      ED Treatments / Results  Labs (all labs ordered are listed, but only abnormal results are displayed) Labs Reviewed - No data to display  EKG None  Radiology Ct Knee Left Wo Contrast  Result Date: 01/05/2019 CLINICAL DATA:  Patellar fracture. Fall in the store tonight. EXAM: CT OF THE LEFT KNEE WITHOUT CONTRAST TECHNIQUE: Multidetector CT imaging of the LEFT knee was performed according to the standard protocol. Multiplanar CT image reconstructions were also generated. COMPARISON:  Radiograph earlier this day. FINDINGS: Bones/Joint/Cartilage Highly comminuted patellar fracture through the mid and superior portion. Osseous distraction of approximately 1 cm of dominant fracture fragments. Fracture extends to the lateral patellofemoral facet. Large patellar tendon enthesophyte at the patellar insertion, small quadriceps tendon enthesophyte. No additional fracture of the knee. Benign-appearing chondral lesion versus remote bone infarct in the lateral femoral metaphysis. There is complex/hemorrhagic knee joint effusion. Ligaments Suboptimally assessed by CT. Muscles and Tendons No confluent intramuscular hematoma. Slight fatty atrophy of semimembranosus. Soft tissues Prominent prepatellar soft tissue edema/hematoma. Vascular calcifications. IMPRESSION: 1. Highly comminuted patellar fracture through the mid and superior portion. Osseous distraction of approximately 1 cm of dominant fracture  fragments. 2. Complex/hemorrhagic knee joint effusion. Prominent prepatellar soft tissue edema/hematoma. Electronically Signed   By: Keith Rake M.D.   On: 01/05/2019 21:26   Dg Knee Complete 4 Views Left  Result Date: 01/05/2019 CLINICAL DATA:  Fall, pain EXAM: LEFT KNEE - COMPLETE 4+ VIEW COMPARISON:  None. FINDINGS: There is a comminuted fracture of the patella with distraction of the upper pole fragments likely retracted by the distal quadriceps tendon there is extensive overlying soft tissue swelling with both prepatellar and suprapatellar joint effusions. Stranding is also present in Hoffa's fat pad. No additional fracture or traumatic malalignment is seen. Irregular spurring at the tibial tuberosity could reflect sequela prior Osgood Schlatter or enthesopathic change. Vascular calcium is noted posterior to the.  IMPRESSION: 1. Comminuted fracture of the patella with distraction of the upper pole fragments likely retracted by the distal quadriceps tendon. 2. Extensive overlying soft tissue swelling with prepatellar and suprapatellar effusions. Electronically Signed   By: Lovena Le M.D.   On: 01/05/2019 20:20    Procedures Procedures (including critical care time)  Medications Ordered in ED Medications  bacitracin ointment ( Topical Given 01/05/19 2131)  acetaminophen (TYLENOL) tablet 650 mg (650 mg Oral Given 01/05/19 2055)     Initial Impression / Assessment and Plan / ED Course  I have reviewed the triage vital signs and the nursing notes.  Pertinent labs & imaging results that were available during my care of the patient were reviewed by me and considered in my medical decision making (see chart for details).  Clinical Course as of Jan 04 2214  Sat Jan 04, 4285  88 73 year old male brought in by ambulance after fall onto left knee tonight.  On exam patient has swelling, ecchymosis, tenderness to the anterior left knee, unable to flex the knee secondary to pain.  No other  injuries. X-ray shows left patella fracture, discussed with Dr. Erlinda Hong, on-call with orthopedics, requests CT of the left knee, placed in long-leg knee immobilizer, crutches to weight-bear as tolerated and Tylenol for pain, follow-up in the office next week to discuss surgery planning. CT completed, patient placed on any immobilizer, given prescription for a walker.   [LM]    Clinical Course User Index [LM] Tacy Learn, PA-C      Final Clinical Impressions(s) / ED Diagnoses   Final diagnoses:  Closed displaced comminuted fracture of left patella, initial encounter  Fall, initial encounter    ED Discharge Orders         Ordered    Walker standard     01/05/19 2138    Misc. Devices [facility] FOLDING) MISC   Once     01/05/19 2140           Tacy Learn, PA-C 01/05/19 2215    Blanchie Dessert, MD 01/06/19 [phone]

## 2019-01-05 NOTE — ED Notes (Signed)
Patient ambulated with crutches.

## 2019-01-05 NOTE — ED Notes (Signed)
Reviewed discharge instructions with pt and family, verbalized understanding. Pt has no further questions about discharge

## 2019-01-08 ENCOUNTER — Ambulatory Visit (INDEPENDENT_AMBULATORY_CARE_PROVIDER_SITE_OTHER): Payer: Medicare Other | Admitting: Orthopaedic Surgery

## 2019-01-08 DIAGNOSIS — S82042A Displaced comminuted fracture of left patella, initial encounter for closed fracture: Secondary | ICD-10-CM

## 2019-01-08 NOTE — Progress Notes (Signed)
Office Visit Note   Patient: Zachary [name]           Date of Birth: April 19, 1945           MRN: [id number] Visit Date: 01/08/2019              Requested by: [name], [address] [place],  [place] [address] PCP: [name], MD   Assessment & Plan: Visit Diagnoses:  1. Closed comminuted fracture of left patella, initial encounter     Plan: Impression is comminuted and displaced left patella fracture.  X-rays and CT scan were reviewed with the patient in detail and recommendation is for surgical fixation with possible partial patellectomy if primary ORIF is unobtainable during operative findings.  Possible complications and risks and rehab and recovery were reviewed with the patient today.  We will have the patient go to [employer] to get a Bledsoe brace today.  We will plan on surgery on Thursday.  Questions encouraged and answered.  Follow-Up Instructions: Return for 2 week postop visit.   Orders:  No orders of the defined types were placed in this encounter.  No orders of the defined types were placed in this encounter.     Procedures: No procedures performed   Clinical Data: No additional findings.   Subjective: Chief Complaint  Patient presents with  . Left Knee - Pain    Manning is a very pleasant 73 year old gentleman who comes in for a left patella fracture.  He suffered this when he fell directly on his left knee on Saturday at [facility].  He was evaluated in the ED initially and placed in a knee immobilizer.  He comes in today for discussion of orthopedic treatment.   Review of Systems  Constitutional: Negative.   All other systems reviewed and are negative.    Objective: Vital Signs: There were no vitals taken for this visit.  Physical Exam Vitals signs and nursing note reviewed.  Constitutional:      Appearance: He is well-developed.  HENT:     Head: Normocephalic and atraumatic.  Eyes:     Pupils: Pupils are equal, round,  and reactive to light.  Neck:     Musculoskeletal: Neck supple.  Pulmonary:     Effort: Pulmonary effort is normal.  Abdominal:     Palpations: Abdomen is soft.  Musculoskeletal: Normal range of motion.  Skin:    General: Skin is warm.  Neurological:     Mental Status: He is alert and oriented to person, place, and time.  Psychiatric:        Behavior: Behavior normal.        Thought Content: Thought content normal.        Judgment: Judgment normal.     Ortho Exam Left knee exam shows moderate swelling with a very superficial abrasion over the anterior aspect of the knee.  The skin does wrinkle with squeezing.  Range of motion not tested. Specialty Comments:  No specialty comments available.  Imaging: No results found.   PMFS History: Patient Active Problem List   Diagnosis Date Noted  . Closed comminuted fracture of left patella, initial encounter 01/08/2019   Past Medical History:  Diagnosis Date  . Diabetes mellitus   . Hypertension     No family history on file.  Past Surgical History:  Procedure Laterality Date  . AMPUTATION    . 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 Not on file  Tobacco Use  . Smoking status: Never Smoker  . Smokeless tobacco: Never Used  Substance and Sexual Activity  . Alcohol use: No  . Drug use: No  . Sexual activity: Not on file

## 2019-01-09 ENCOUNTER — Encounter (HOSPITAL_BASED_OUTPATIENT_CLINIC_OR_DEPARTMENT_OTHER): Payer: Self-pay | Admitting: *Deleted

## 2019-01-09 ENCOUNTER — Other Ambulatory Visit (HOSPITAL_COMMUNITY)
Admission: RE | Admit: 2019-01-09 | Discharge: 2019-01-09 | Disposition: A | Discharge status: Home / Self Care | Payer: Medicare Other | Source: Ambulatory Visit | Attending: Orthopaedic Surgery | Admitting: Orthopaedic Surgery

## 2019-01-09 ENCOUNTER — Encounter (HOSPITAL_BASED_OUTPATIENT_CLINIC_OR_DEPARTMENT_OTHER)
Admission: RE | Admit: 2019-01-09 | Discharge: 2019-01-09 | Disposition: A | Discharge status: Home / Self Care | Payer: Medicare Other | Source: Ambulatory Visit | Attending: Orthopaedic Surgery | Admitting: Orthopaedic Surgery

## 2019-01-09 ENCOUNTER — Other Ambulatory Visit: Payer: Self-pay

## 2019-01-09 DIAGNOSIS — X58XXXA Exposure to other specified factors, initial encounter: Secondary | ICD-10-CM | POA: Diagnosis not present

## 2019-01-09 DIAGNOSIS — S76112A Strain of left quadriceps muscle, fascia and tendon, initial encounter: Secondary | ICD-10-CM | POA: Diagnosis not present

## 2019-01-09 DIAGNOSIS — Z7902 Long term (current) use of antithrombotics/antiplatelets: Secondary | ICD-10-CM | POA: Diagnosis not present

## 2019-01-09 DIAGNOSIS — Z794 Long term (current) use of insulin: Secondary | ICD-10-CM | POA: Diagnosis not present

## 2019-01-09 DIAGNOSIS — S82042A Displaced comminuted fracture of left patella, initial encounter for closed fracture: Secondary | ICD-10-CM | POA: Diagnosis not present

## 2019-01-09 DIAGNOSIS — E1151 Type 2 diabetes mellitus with diabetic peripheral angiopathy without gangrene: Secondary | ICD-10-CM | POA: Diagnosis not present

## 2019-01-09 DIAGNOSIS — Z20828 Contact with and (suspected) exposure to other viral communicable diseases: Secondary | ICD-10-CM | POA: Insufficient documentation

## 2019-01-09 DIAGNOSIS — Z01812 Encounter for preprocedural laboratory examination: Secondary | ICD-10-CM | POA: Insufficient documentation

## 2019-01-09 DIAGNOSIS — Z7982 Long term (current) use of aspirin: Secondary | ICD-10-CM | POA: Diagnosis not present

## 2019-01-09 DIAGNOSIS — Z79899 Other long term (current) drug therapy: Secondary | ICD-10-CM | POA: Diagnosis not present

## 2019-01-09 LAB — BASIC METABOLIC PANEL
Anion gap: 13 (ref 5–15)
BUN: 25 mg/dL — ABNORMAL HIGH (ref 8–23)
CO2: 23 mmol/L (ref 22–32)
Calcium: 9.2 mg/dL (ref 8.9–10.3)
Chloride: 101 mmol/L (ref 98–111)
Creatinine, Ser: 1.67 mg/dL — ABNORMAL HIGH (ref 0.61–1.24)
GFR calc Af Amer: 46 mL/min — ABNORMAL LOW (ref >60)
GFR calc non Af Amer: 39 mL/min — ABNORMAL LOW (ref >60)
Glucose, Bld: 184 mg/dL — ABNORMAL HIGH (ref 70–99)
Potassium: 4.7 mmol/L (ref 3.5–5.1)
Sodium: 137 mmol/L (ref 135–145)

## 2019-01-09 LAB — SARS CORONAVIRUS 2 (TAT 6-24 HRS): SARS Coronavirus 2: NEGATIVE

## 2019-01-09 NOTE — Progress Notes (Addendum)

## 2019-01-10 ENCOUNTER — Ambulatory Visit (HOSPITAL_BASED_OUTPATIENT_CLINIC_OR_DEPARTMENT_OTHER): Payer: Medicare Other | Admitting: Anesthesiology

## 2019-01-10 ENCOUNTER — Ambulatory Visit (HOSPITAL_COMMUNITY): Payer: Medicare Other

## 2019-01-10 ENCOUNTER — Encounter (HOSPITAL_BASED_OUTPATIENT_CLINIC_OR_DEPARTMENT_OTHER): Payer: Self-pay

## 2019-01-10 ENCOUNTER — Other Ambulatory Visit: Payer: Self-pay

## 2019-01-10 ENCOUNTER — Encounter (HOSPITAL_BASED_OUTPATIENT_CLINIC_OR_DEPARTMENT_OTHER)
Admission: RE | Disposition: A | Discharge status: Home / Self Care | Payer: Self-pay | Source: Ambulatory Visit | Attending: Orthopaedic Surgery

## 2019-01-10 ENCOUNTER — Ambulatory Visit (HOSPITAL_BASED_OUTPATIENT_CLINIC_OR_DEPARTMENT_OTHER)
Admission: RE | Admit: 2019-01-10 | Discharge: 2019-01-10 | Disposition: A | Discharge status: Home / Self Care | Payer: Medicare Other | Source: Ambulatory Visit | Attending: Orthopaedic Surgery | Admitting: Orthopaedic Surgery

## 2019-01-10 DIAGNOSIS — E1151 Type 2 diabetes mellitus with diabetic peripheral angiopathy without gangrene: Secondary | ICD-10-CM | POA: Diagnosis not present

## 2019-01-10 DIAGNOSIS — Z794 Long term (current) use of insulin: Secondary | ICD-10-CM | POA: Insufficient documentation

## 2019-01-10 DIAGNOSIS — S82002A Unspecified fracture of left patella, initial encounter for closed fracture: Secondary | ICD-10-CM

## 2019-01-10 DIAGNOSIS — Z7902 Long term (current) use of antithrombotics/antiplatelets: Secondary | ICD-10-CM | POA: Diagnosis not present

## 2019-01-10 DIAGNOSIS — S82042A Displaced comminuted fracture of left patella, initial encounter for closed fracture: Secondary | ICD-10-CM | POA: Insufficient documentation

## 2019-01-10 DIAGNOSIS — X58XXXA Exposure to other specified factors, initial encounter: Secondary | ICD-10-CM | POA: Insufficient documentation

## 2019-01-10 DIAGNOSIS — Z79899 Other long term (current) drug therapy: Secondary | ICD-10-CM | POA: Diagnosis not present

## 2019-01-10 DIAGNOSIS — Z7982 Long term (current) use of aspirin: Secondary | ICD-10-CM | POA: Insufficient documentation

## 2019-01-10 DIAGNOSIS — S76112A Strain of left quadriceps muscle, fascia and tendon, initial encounter: Secondary | ICD-10-CM | POA: Diagnosis not present

## 2019-01-10 DIAGNOSIS — S82002D Unspecified fracture of left patella, subsequent encounter for closed fracture with routine healing: Secondary | ICD-10-CM | POA: Diagnosis not present

## 2019-01-10 DIAGNOSIS — G8918 Other acute postprocedural pain: Secondary | ICD-10-CM | POA: Diagnosis not present

## 2019-01-10 HISTORY — DX: Pure hypercholesterolemia, unspecified: E78.00

## 2019-01-10 HISTORY — DX: Unspecified fracture of unspecified patella, initial encounter for closed fracture: S82.009A

## 2019-01-10 HISTORY — PX: ORIF PATELLA: SHX5033

## 2019-01-10 HISTORY — DX: Peripheral vascular disease, unspecified: I73.9

## 2019-01-10 LAB — GLUCOSE, CAPILLARY
Glucose-Capillary: 173 mg/dL — ABNORMAL HIGH (ref 70–99)
Glucose-Capillary: 143 mg/dL — ABNORMAL HIGH (ref 70–99)

## 2019-01-10 SURGERY — OPEN REDUCTION INTERNAL FIXATION (ORIF) PATELLA
Anesthesia: Regional | Site: Knee | Laterality: Left

## 2019-01-10 MED ORDER — ROPIVACAINE HCL 5 MG/ML IJ SOLN
INTRAMUSCULAR | Status: DC | PRN
  Administered 2019-01-10: 20 mL via PERINEURAL

## 2019-01-10 MED ORDER — MIDAZOLAM HCL 2 MG/2ML IJ SOLN: 1.0000 mg | INTRAMUSCULAR | Status: DC | PRN

## 2019-01-10 MED ORDER — DEXAMETHASONE SODIUM PHOSPHATE 10 MG/ML IJ SOLN
INTRAMUSCULAR | Status: AC
  Filled 2019-01-10: qty 1

## 2019-01-10 MED ORDER — 0.9 % SODIUM CHLORIDE (POUR BTL) OPTIME
TOPICAL | Status: DC | PRN
  Administered 2019-01-10: 13:00:00 2000 mL

## 2019-01-10 MED ORDER — FENTANYL CITRATE (PF) 100 MCG/2ML IJ SOLN
50.0000 ug | INTRAMUSCULAR | Status: AC | PRN
  Administered 2019-01-10: 25 ug via INTRAVENOUS
  Administered 2019-01-10: 11:00:00 50 ug via INTRAVENOUS
  Administered 2019-01-10 (×3): 25 ug via INTRAVENOUS

## 2019-01-10 MED ORDER — SCOPOLAMINE 1 MG/3DAYS TD PT72: 1.0000 | MEDICATED_PATCH | Freq: Once | TRANSDERMAL | Status: DC

## 2019-01-10 MED ORDER — ONDANSETRON HCL 4 MG/2ML IJ SOLN
INTRAMUSCULAR | Status: AC
  Filled 2019-01-10: qty 2

## 2019-01-10 MED ORDER — LIDOCAINE 2% (20 MG/ML) 5 ML SYRINGE
INTRAMUSCULAR | Status: AC
  Filled 2019-01-10: qty 5

## 2019-01-10 MED ORDER — OXYCODONE HCL ER 10 MG PO T12A: 10.0000 mg | EXTENDED_RELEASE_TABLET | Freq: Two times a day (BID) | ORAL | 0 refills | Status: AC

## 2019-01-10 MED ORDER — CEFAZOLIN SODIUM-DEXTROSE 2-4 GM/100ML-% IV SOLN
INTRAVENOUS | Status: AC
  Filled 2019-01-10: qty 100

## 2019-01-10 MED ORDER — LACTATED RINGERS IV SOLN
INTRAVENOUS | Status: DC
  Administered 2019-01-10 (×2): via INTRAVENOUS

## 2019-01-10 MED ORDER — CHLORHEXIDINE GLUCONATE 4 % EX LIQD: 60.0000 mL | Freq: Once | CUTANEOUS | Status: DC

## 2019-01-10 MED ORDER — CEFAZOLIN SODIUM-DEXTROSE 2-4 GM/100ML-% IV SOLN
2.0000 g | INTRAVENOUS | Status: AC
  Administered 2019-01-10: 2 g via INTRAVENOUS

## 2019-01-10 MED ORDER — PROPOFOL 10 MG/ML IV BOLUS
INTRAVENOUS | Status: DC | PRN
  Administered 2019-01-10: 150 mg via INTRAVENOUS

## 2019-01-10 MED ORDER — PROPOFOL 500 MG/50ML IV EMUL
INTRAVENOUS | Status: AC
  Filled 2019-01-10: qty 50

## 2019-01-10 MED ORDER — FENTANYL CITRATE (PF) 100 MCG/2ML IJ SOLN
INTRAMUSCULAR | Status: AC
  Filled 2019-01-10: qty 2

## 2019-01-10 MED ORDER — OXYCODONE HCL 5 MG PO TABS
ORAL_TABLET | ORAL | Status: AC
  Filled 2019-01-10: qty 1

## 2019-01-10 MED ORDER — ACETAMINOPHEN 500 MG PO TABS: 1000.0000 mg | ORAL_TABLET | Freq: Once | ORAL | Status: DC

## 2019-01-10 MED ORDER — OXYCODONE HCL 5 MG PO TABS
5.0000 mg | ORAL_TABLET | Freq: Once | ORAL | Status: AC
  Administered 2019-01-10: 5 mg via ORAL

## 2019-01-10 MED ORDER — ONDANSETRON HCL 4 MG/2ML IJ SOLN
INTRAMUSCULAR | Status: DC | PRN
  Administered 2019-01-10: 4 mg via INTRAVENOUS

## 2019-01-10 MED ORDER — MIDAZOLAM HCL 2 MG/2ML IJ SOLN
INTRAMUSCULAR | Status: AC
  Filled 2019-01-10: qty 2

## 2019-01-10 MED ORDER — LACTATED RINGERS IV SOLN: INTRAVENOUS | Status: DC

## 2019-01-10 MED ORDER — OXYCODONE-ACETAMINOPHEN 5-325 MG PO TABS: 1.0000 | ORAL_TABLET | Freq: Three times a day (TID) | ORAL | 0 refills | Status: DC | PRN

## 2019-01-10 MED ORDER — METHOCARBAMOL 750 MG PO TABS: 750.0000 mg | ORAL_TABLET | Freq: Two times a day (BID) | ORAL | 0 refills | Status: DC | PRN

## 2019-01-10 MED ORDER — FENTANYL CITRATE (PF) 100 MCG/2ML IJ SOLN: 25.0000 ug | INTRAMUSCULAR | Status: DC | PRN

## 2019-01-10 MED ORDER — DEXAMETHASONE SODIUM PHOSPHATE 10 MG/ML IJ SOLN
INTRAMUSCULAR | Status: DC | PRN
  Administered 2019-01-10: 5 mg via INTRAVENOUS

## 2019-01-10 MED ORDER — CLONIDINE HCL (ANALGESIA) 100 MCG/ML EP SOLN
EPIDURAL | Status: DC | PRN
  Administered 2019-01-10: 100 ug

## 2019-01-10 MED ORDER — ONDANSETRON HCL 4 MG PO TABS: 4.0000 mg | ORAL_TABLET | Freq: Three times a day (TID) | ORAL | 0 refills | Status: DC | PRN

## 2019-01-10 MED ORDER — LIDOCAINE HCL (CARDIAC) PF 100 MG/5ML IV SOSY
PREFILLED_SYRINGE | INTRAVENOUS | Status: DC | PRN
  Administered 2019-01-10: 40 mg via INTRAVENOUS

## 2019-01-10 SURGICAL SUPPLY — 114 items
BANDAGE ESMARK 6X9 LF (GAUZE/BANDAGES/DRESSINGS) ×1 IMPLANT
BIT DRILL 2.7 QC CANN 155 (BIT) ×2 IMPLANT
BIT DRILL 2.7 QC CANN 155MM (BIT) ×1
BLADE HEX COATED 2.75 (ELECTRODE) IMPLANT
BLADE SURG 10 STRL SS (BLADE) ×3 IMPLANT
BLADE SURG 15 STRL LF DISP TIS (BLADE) ×2 IMPLANT
BLADE SURG 15 STRL SS (BLADE) ×6
BNDG CMPR 9X6 STRL LF SNTH (GAUZE/BANDAGES/DRESSINGS) ×1
BNDG COHESIVE 6X5 TAN STRL LF (GAUZE/BANDAGES/DRESSINGS) ×3 IMPLANT
BNDG ELASTIC 4X5.8 VLCR STR LF (GAUZE/BANDAGES/DRESSINGS) ×3 IMPLANT
BNDG ELASTIC 6X5.8 VLCR STR LF (GAUZE/BANDAGES/DRESSINGS) ×3 IMPLANT
BNDG ESMARK 6X9 LF (GAUZE/BANDAGES/DRESSINGS) ×3
CANISTER SUCT 1200ML W/VALVE (MISCELLANEOUS) ×3 IMPLANT
CLOSURE WOUND 1/2 X4 (GAUZE/BANDAGES/DRESSINGS)
COVER BACK TABLE REUSABLE LG (DRAPES) ×3 IMPLANT
COVER MAYO STAND REUSABLE (DRAPES) ×3 IMPLANT
COVER WAND RF STERILE (DRAPES) IMPLANT
CUFF TOURN SGL QUICK 24 (TOURNIQUET CUFF)
CUFF TOURN SGL QUICK 34 (TOURNIQUET CUFF) ×3
CUFF TRNQT CYL 24X4X16.5-23 (TOURNIQUET CUFF) IMPLANT
CUFF TRNQT CYL 34X4.125X (TOURNIQUET CUFF) ×1 IMPLANT
DECANTER SPIKE VIAL GLASS SM (MISCELLANEOUS) IMPLANT
DRAPE C-ARM 42X72 X-RAY (DRAPES) ×3 IMPLANT
DRAPE C-ARMOR (DRAPES) ×3 IMPLANT
DRAPE EXTREMITY T 121X128X90 (DISPOSABLE) ×3 IMPLANT
DRAPE IMP U-DRAPE 54X76 (DRAPES) ×3 IMPLANT
DRAPE INCISE IOBAN 66X45 STRL (DRAPES) ×3 IMPLANT
DRAPE U-SHAPE 47X51 STRL (DRAPES) ×3 IMPLANT
DRSG MEPILEX BORDER 4X4 (GAUZE/BANDAGES/DRESSINGS) ×3 IMPLANT
DRSG MEPILEX BORDER 4X8 (GAUZE/BANDAGES/DRESSINGS) ×3 IMPLANT
DRSG PAD ABDOMINAL 8X10 ST (GAUZE/BANDAGES/DRESSINGS) ×3 IMPLANT
DURAPREP 26ML APPLICATOR (WOUND CARE) ×6 IMPLANT
ELECT REM PT RETURN 9FT ADLT (ELECTROSURGICAL) ×3
ELECTRODE REM PT RTRN 9FT ADLT (ELECTROSURGICAL) ×1 IMPLANT
GAUZE 4X4 16PLY RFD (DISPOSABLE) IMPLANT
GAUZE SPONGE 4X4 12PLY STRL (GAUZE/BANDAGES/DRESSINGS) ×3 IMPLANT
GAUZE XEROFORM 1X8 LF (GAUZE/BANDAGES/DRESSINGS) ×3 IMPLANT
GLOVE BIO SURGEON STRL SZ7 (GLOVE) ×6 IMPLANT
GLOVE BIOGEL PI IND STRL 7.0 (GLOVE) ×2 IMPLANT
GLOVE BIOGEL PI IND STRL 7.5 (GLOVE) ×1 IMPLANT
GLOVE BIOGEL PI INDICATOR 7.0 (GLOVE) ×4
GLOVE BIOGEL PI INDICATOR 7.5 (GLOVE) ×2
GLOVE ECLIPSE 7.0 STRL STRAW (GLOVE) ×3 IMPLANT
GLOVE SKINSENSE NS SZ7.5 (GLOVE) ×2
GLOVE SKINSENSE STRL SZ7.5 (GLOVE) ×1 IMPLANT
GLOVE SURG SYN 7.5  E (GLOVE) ×2
GLOVE SURG SYN 7.5 E (GLOVE) ×1 IMPLANT
GOWN STRL REIN XL XLG (GOWN DISPOSABLE) ×3 IMPLANT
GOWN STRL REUS W/ TWL LRG LVL3 (GOWN DISPOSABLE) ×2 IMPLANT
GOWN STRL REUS W/ TWL XL LVL3 (GOWN DISPOSABLE) ×2 IMPLANT
GOWN STRL REUS W/TWL LRG LVL3 (GOWN DISPOSABLE) ×6
GOWN STRL REUS W/TWL XL LVL3 (GOWN DISPOSABLE) ×6
GUIDE PIN 1.3 (PIN) ×6
IMMOBILIZER KNEE 22 UNIV (SOFTGOODS) IMPLANT
IMMOBILIZER KNEE 24 THIGH 36 (MISCELLANEOUS) ×1 IMPLANT
IMMOBILIZER KNEE 24 UNIV (MISCELLANEOUS) ×3
K-WIRE 1.6 (WIRE) ×6
K-WIRE FX150X1.6XTROC PNT (WIRE) ×2
KWIRE FX150X1.6XTROC PNT (WIRE) ×2 IMPLANT
MANIFOLD NEPTUNE II (INSTRUMENTS) IMPLANT
NEEDLE HYPO 22GX1.5 SAFETY (NEEDLE) IMPLANT
NEEDLE KEITH (NEEDLE) ×3 IMPLANT
NS IRRIG 1000ML POUR BTL (IV SOLUTION) ×6 IMPLANT
PACK BASIN DAY SURGERY FS (CUSTOM PROCEDURE TRAY) ×3 IMPLANT
PAD CAST 3X4 CTTN HI CHSV (CAST SUPPLIES) IMPLANT
PAD CAST 4YDX4 CTTN HI CHSV (CAST SUPPLIES) ×1 IMPLANT
PADDING CAST COTTON 3X4 STRL (CAST SUPPLIES)
PADDING CAST COTTON 4X4 STRL (CAST SUPPLIES) ×3
PADDING CAST COTTON 6X4 STRL (CAST SUPPLIES) IMPLANT
PADDING CAST SYN 6 (CAST SUPPLIES)
PADDING CAST SYNTHETIC 4 (CAST SUPPLIES)
PADDING CAST SYNTHETIC 4X4 STR (CAST SUPPLIES) IMPLANT
PADDING CAST SYNTHETIC 6X4 NS (CAST SUPPLIES) IMPLANT
PASSER SUT SWANSON 36MM LOOP (INSTRUMENTS) ×3 IMPLANT
PENCIL BUTTON HOLSTER BLD 10FT (ELECTRODE) ×3 IMPLANT
PIN GUIDE 1.3 (PIN) ×2 IMPLANT
SCREW CANN 6XFT 40X4X2.7X (Screw) ×1 IMPLANT
SCREW CANNULATED 4.0X40 (Screw) ×3 IMPLANT
SCREW CANNULATED 4.1X40 (Screw) ×3 IMPLANT
SLEEVE SCD COMPRESS KNEE MED (MISCELLANEOUS) ×3 IMPLANT
SPLINT FIBERGLASS 3X35 (CAST SUPPLIES) IMPLANT
SPLINT FIBERGLASS 4X30 (CAST SUPPLIES) IMPLANT
SPONGE LAP 18X18 RF (DISPOSABLE) ×6 IMPLANT
STAPLER VISISTAT (STAPLE) IMPLANT
STOCKINETTE IMPERVIOUS LG (DRAPES) ×3 IMPLANT
STRIP CLOSURE SKIN 1/2X4 (GAUZE/BANDAGES/DRESSINGS) IMPLANT
SUCTION FRAZIER HANDLE 10FR (MISCELLANEOUS) ×2
SUCTION TUBE FRAZIER 10FR DISP (MISCELLANEOUS) ×1 IMPLANT
SUT ETHILON 2 0 FS 18 (SUTURE) ×12 IMPLANT
SUT ETHILON 3 0 PS 1 (SUTURE) IMPLANT
SUT FIBERWIRE #2 38 REV NDL BL (SUTURE) ×3
SUT FIBERWIRE #2 38 T-5 BLUE (SUTURE)
SUT FIBERWIRE #5 38 CONV NDL (SUTURE)
SUT FIBERWIRE 2-0 18 17.9 3/8 (SUTURE) ×6
SUT MNCRL AB 3-0 PS2 18 (SUTURE) IMPLANT
SUT VIC AB 0 CT1 18XCR BRD 8 (SUTURE) IMPLANT
SUT VIC AB 0 CT1 8-18 (SUTURE)
SUT VIC AB 1 CT1 27 (SUTURE) ×3
SUT VIC AB 1 CT1 27XBRD ANBCTR (SUTURE) ×1 IMPLANT
SUT VIC AB 2-0 CT1 27 (SUTURE) ×15
SUT VIC AB 2-0 CT1 TAPERPNT 27 (SUTURE) ×5 IMPLANT
SUT VIC AB 2-0 SH 27 (SUTURE) ×3
SUT VIC AB 2-0 SH 27XBRD (SUTURE) ×1 IMPLANT
SUTURE FIBERWR #2 38 T-5 BLUE (SUTURE) IMPLANT
SUTURE FIBERWR #5 38 CONV NDL (SUTURE) IMPLANT
SUTURE FIBERWR 2-0 18 17.9 3/8 (SUTURE) ×2 IMPLANT
SUTURE FIBERWR#2 38 REV NDL BL (SUTURE) ×1 IMPLANT
SYR BULB 3OZ (MISCELLANEOUS) ×3 IMPLANT
SYR CONTROL 10ML LL (SYRINGE) IMPLANT
TOWEL GREEN STERILE FF (TOWEL DISPOSABLE) ×6 IMPLANT
TUBE CONNECTING 20'X1/4 (TUBING) ×1
TUBE CONNECTING 20X1/4 (TUBING) ×2 IMPLANT
UNDERPAD 30X36 HEAVY ABSORB (UNDERPADS AND DIAPERS) ×3 IMPLANT
YANKAUER SUCT BULB TIP NO VENT (SUCTIONS) ×3 IMPLANT

## 2019-01-10 NOTE — Anesthesia Preprocedure Evaluation (Addendum)
Anesthesia Evaluation  Patient identified by MRN, date of birth, ID band Patient awake    Reviewed: Allergy & Precautions, NPO status , Patient's Chart, lab work & pertinent test results  Airway Mallampati: II  TM Distance: >3 FB Neck ROM: Full    Dental  (+) Edentulous Upper, Edentulous Lower   Pulmonary neg pulmonary ROS,    Pulmonary exam normal breath sounds clear to auscultation       Cardiovascular hypertension, + Peripheral Vascular Disease  Normal cardiovascular exam Rhythm:Regular Rate:Normal  HLD   Neuro/Psych negative neurological ROS  negative psych ROS   GI/Hepatic negative GI ROS, Neg liver ROS,   Endo/Other  negative endocrine ROSdiabetes, Insulin Dependent, Oral Hypoglycemic Agents  Renal/GU negative Renal ROS  negative genitourinary   Musculoskeletal negative musculoskeletal ROS (+)   Abdominal   Peds  Hematology  (+) Blood dyscrasia (on plavix), ,   Anesthesia Other Findings   Reproductive/Obstetrics                            Anesthesia Physical Anesthesia Plan  ASA: III  Anesthesia Plan: General and Regional   Post-op Pain Management:  Regional for Post-op pain   Induction: Intravenous  PONV Risk Score and Plan: 2 and Ondansetron, Dexamethasone and Midazolam  Airway Management Planned: LMA  Additional Equipment:   Intra-op Plan:   Post-operative Plan: Extubation in OR  Informed Consent: I have reviewed the patients History and Physical, chart, labs and discussed the procedure including the risks, benefits and alternatives for the proposed anesthesia with the patient or authorized representative who has indicated his/her understanding and acceptance.     Dental advisory given  Plan Discussed with: CRNA  Anesthesia Plan Comments:         Anesthesia Quick Evaluation

## 2019-01-10 NOTE — Anesthesia Postprocedure Evaluation (Signed)
Anesthesia Post Note  Patient: Zachary [name]  Procedure(s) Performed: OPEN REDUCTION INTERNAL (ORIF) FIXATION LEFT PATELLA FRACTURE (Left Knee)     Patient location during evaluation: PACU Anesthesia Type: Regional and General Level of consciousness: awake and alert Pain management: pain level controlled Vital Signs Assessment: post-procedure vital signs reviewed and stable Respiratory status: spontaneous breathing, nonlabored ventilation, respiratory function stable and patient connected to nasal cannula oxygen Cardiovascular status: blood pressure returned to baseline and stable Postop Assessment: no apparent nausea or vomiting Anesthetic complications: no    Last Vitals:  Vitals:   01/10/19 1415 01/10/19 1430  BP: 137/70 (!) 155/67  Pulse: 78 80  Resp: 11 10  Temp:    SpO2: 100% 95%    Last Pain:  Vitals:   01/10/19 1430  TempSrc:   PainSc: 3                  Nakisha Chai L Mykala Mccready

## 2019-01-10 NOTE — Progress Notes (Signed)
Assisted Dr. Woodrum with left, ultrasound guided, adductor canal block. Side rails up, monitors on throughout procedure. See vital signs in flow sheet. Tolerated Procedure well.  

## 2019-01-10 NOTE — Transfer of Care (Signed)
Immediate Anesthesia Transfer of Care Note  Patient: Zachary [name]  Procedure(s) Performed: OPEN REDUCTION INTERNAL (ORIF) FIXATION LEFT PATELLA FRACTURE (Left Knee)  Patient Location: PACU  Anesthesia Type:GA combined with regional for post-op pain  Level of Consciousness: sedated  Airway & Oxygen Therapy: Patient Spontanous Breathing and Patient connected to nasal cannula oxygen  Post-op Assessment: Report given to RN and Post -op Vital signs reviewed and stable  Post vital signs: Reviewed and stable  Last Vitals:  Vitals Value Taken Time  BP 134/62 01/10/19 1404  Temp    Pulse 78 01/10/19 1406  Resp 15 01/10/19 1406  SpO2 99 % 01/10/19 1406  Vitals shown include unvalidated device data.  Last Pain:  Vitals:   01/10/19 1021  TempSrc: Oral  PainSc: 0-No pain      Patients Stated Pain Goal: 5 ([id number])  Complications: No apparent anesthesia complications

## 2019-01-10 NOTE — Progress Notes (Signed)
EKG reviewed by Dr. Clide Cliff. Will proceed with surgery as scheduled.

## 2019-01-10 NOTE — H&P (Signed)
PREOPERATIVE H&P  Chief Complaint: left patella fracture  HPI: Zachary [name] is a 73 y.o. male who presents for surgical treatment of left patella fracture.  He denies any changes in medical history.  Past Medical History:  Diagnosis Date  . Diabetes mellitus   . Hypercholesteremia   . Hypertension   . Patella fracture    left  . PVD (peripheral vascular disease) ([place])    Past Surgical History:  Procedure Laterality Date  . AMPUTATION    . 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 Marital status: Married    Spouse name: Not on file  . Number of children: Not on file  . Years of education: Not on file  . Highest education level: Not on file  Occupational History  . Not on file  Social Needs  . Financial resource strain: Not on file  . Food insecurity    Worry: Not on file    Inability: Not on file  . Transportation needs    Medical: Not on file    Non-medical: Not on file  Tobacco Use  . Smoking status: Never Smoker  . Smokeless tobacco: Never Used  Substance and Sexual Activity  . Alcohol use: No  . Drug use: No  . Sexual activity: Not on file  Lifestyle  . Physical activity    Days per week: Not on file    Minutes per session: Not on file  . Stress: Not on file  Relationships  . Social Herbalist on phone: Not on file    Gets together: Not on file    Attends religious service: Not on file    Active member of club or organization: Not on file    Attends meetings of clubs or organizations: Not on file    Relationship status: Not on file  Other Topics Concern  . Not on file  Social History Narrative  . Not on file   History reviewed. No pertinent family history. No Known Allergies Prior to Admission medications   Medication Sig Start Date End Date Taking?  Authorizing Provider  acarbose (PRECOSE) 50 MG tablet Take 50 mg by mouth daily.   Yes [provider]  aspirin EC 81 MG tablet Take 81 mg by mouth daily.   Yes [provider]  clopidogrel (PLAVIX) 75 MG tablet Take 75 mg by mouth daily.   Yes [provider]  dapagliflozin propanediol (FARXIGA) 5 MG TABS tablet Take 5 mg by mouth daily.   Yes [provider]  diltiazem (DILACOR XR) 120 MG 24 hr capsule Take 120 mg by mouth daily.   Yes [provider]  ezetimibe (ZETIA) 10 MG tablet Take 10 mg by mouth daily.   Yes [provider]  fish oil-omega-3 fatty acids 1000 MG capsule Take 2 g by mouth daily.   Yes [provider]  Insulin Glargine-Lixisenatide (SOLIQUA) 100-33 UNT-MCG/ML SOPN Inject 60 Units into the skin.   Yes [provider]     Positive ROS: All other systems have been reviewed and were otherwise negative with the exception of those mentioned in the HPI and as above.  Physical Exam: General: Alert, no acute distress Cardiovascular: No pedal edema Respiratory: No cyanosis, no use of accessory musculature GI: abdomen soft Skin: No lesions  in the area of chief complaint Neurologic: Sensation intact distally Psychiatric: Patient is competent for consent with normal mood and affect Lymphatic: no lymphedema  MUSCULOSKELETAL: exam stable  Assessment: left patella fracture  Plan: Plan for Procedure(s): OPEN REDUCTION INTERNAL (ORIF) FIXATION LEFT PATELLA FRACTURE VS. PARTIAL PATELLECTOMY  The risks benefits and alternatives were discussed with the patient including but not limited to the risks of nonoperative treatment, versus surgical intervention including infection, bleeding, nerve injury,  blood clots, cardiopulmonary complications, morbidity, mortality, among others, and they were willing to proceed.   Eduard Roux, MD   01/10/2019 11:50 AM

## 2019-01-10 NOTE — Anesthesia Procedure Notes (Signed)
Procedure Name: LMA Insertion Date/Time: 01/10/2019 12:18 PM Performed by: Maryella Shivers, CRNA Pre-anesthesia Checklist: Patient identified, Emergency Drugs available, Suction available and Patient being monitored Patient Re-evaluated:Patient Re-evaluated prior to induction Oxygen Delivery Method: Circle system utilized Preoxygenation: Pre-oxygenation with 100% oxygen Induction Type: IV induction Ventilation: Mask ventilation without difficulty LMA: LMA inserted LMA Size: 4.5 Number of attempts: 1 Airway Equipment and Method: Bite block Placement Confirmation: positive ETCO2 Tube secured with: Tape Dental Injury: Teeth and Oropharynx as per pre-operative assessment

## 2019-01-10 NOTE — Op Note (Signed)
Date of Surgery: 01/10/2019  INDICATIONS: Zachary [name] is a 73 y.o.-year-old male with a left patella fracture and partial quadriceps rupture;  The patient did consent to the procedure after discussion of the risks and benefits.  PREOPERATIVE DIAGNOSIS: Left comminuted patella fracture with partial quadriceps rupture  POSTOPERATIVE DIAGNOSIS: Same.  PROCEDURE:  1.  Open reduction internal fixation of left patella fracture 2.  Repair of partial rupture of left quadriceps  SURGEON: N. Eduard Roux, M.D.  ASSIST: Ciro Backer [place], [place]; necessary for the timely completion of procedure and due to complexity of procedure.  ANESTHESIA:  general, regional block  IV FLUIDS AND URINE: See anesthesia.  ESTIMATED BLOOD LOSS: Minimal mL.  IMPLANTS: [employer] 4.0 mm cannulated screws  DRAINS: None  COMPLICATIONS: see description of procedure.  DESCRIPTION OF PROCEDURE: The patient was brought to the operating room and placed supine on the operating table.  The patient had been signed prior to the procedure and this was documented. The patient had the anesthesia placed by the anesthesiologist.  A time-out was performed to confirm that this was the correct patient, site, side and location. The patient did receive antibiotics prior to the incision and was re-dosed during the procedure as needed at indicated intervals.  A tourniquet was placed on the upper left thigh.  The patient had the operative extremity prepped and draped in the standard surgical fashion.    The extremity was exsanguinated and the tourniquet was inflated to 300 mmHg.  An incision was made on the anterior aspect of the knee.  Dissection was carried down through the subcutaneous tissue and full-thickness flaps were elevated.  Fracture hematoma was then encountered and evacuated.  The knee joint was thoroughly lavaged with normal saline.  The patella demonstrated significant comminution with 1 large inferior medial piece  along with a large superior lateral piece and a smaller superior medial piece.  I attempted to reduce all of the pieces back together but I was unable to do so mainly with the superior medial piece therefore I was only able to obtain a reduction of the inferior medial to the superior lateral fracture fragments.  There was excellent bony contact and reduction.  This was clamped in place and confirmed under fluoroscopy.  2 parallel K wires were then advanced perpendicular to the fracture using fluoroscopic guidance.  The length of the screws were measured and placed after drilling.  Each screw had excellent purchase.  The for screw was partially threaded in order to gain compression with the second screw fully threaded.  The clamp was removed and the fracture remained reduced.  I then found a partial avulsion of the quadriceps tendon from the superior medial fracture fragment.  The fracture was excised and a direct repair of the quadriceps tendon back to the bulk of the patella was performed using an interrupted #2 FiberWire in a figure 8 fashion.  I also used a free needle to construct a figure of 8 tension band construct using suture tape which was delivered through the cannula of the cannulated screws.  The medial lateral retinacula were repaired using interrupted #2 FiberWire.  The surgical wound was then thoroughly irrigated and closed in a layered fashion.  Sterile dressings were applied.  The knee was placed in a Bledsoe brace in extension.  Patient tolerated the procedure well had no immediate complications.  POSTOPERATIVE PLAN: Follow up in 2 weeks in office  N. Eduard Roux, MD 1:36 PM

## 2019-01-10 NOTE — Discharge Instructions (Signed)
Postoperative instructions:  Weightbearing instructions: as tolerated in knee brace  Keep your dressing and/or splint clean and dry at all times.  You can remove your dressing on post-operative day #3 and change with a dry/sterile dressing or Band-Aids as needed thereafter.    Incision instructions:  Do not soak your incision for 3 weeks after surgery.  If the incision gets wet, pat dry and do not scrub the incision.  Pain control:  You have been given a prescription to be taken as directed for post-operative pain control.  In addition, elevate the operative extremity above the heart at all times to prevent swelling and throbbing pain.  Take over-the-counter Colace, 100mg  by mouth twice a day while taking narcotic pain medications to help prevent constipation.  Follow up appointments: 1) 14 days for suture removal and wound check. 2) Dr. Erlinda Hong as scheduled.   -------------------------------------------------------------------------------------------------------------  After Surgery Pain Control:  After your surgery, post-surgical discomfort or pain is likely. This discomfort can last several days to a few weeks. At certain times of the day your discomfort may be more intense.  Did you receive a nerve block?  A nerve block can provide pain relief for one hour to two days after your surgery. As long as the nerve block is working, you will experience little or no sensation in the area the surgeon operated on.  As the nerve block wears off, you will begin to experience pain or discomfort. It is very important that you begin taking your prescribed pain medication before the nerve block fully wears off. Treating your pain at the first sign of the block wearing off will ensure your pain is better controlled and more tolerable when full-sensation returns. Do not wait until the pain is intolerable, as the medicine will be less effective. It is better to treat pain in advance than to try and catch up.    General Anesthesia:  If you did not receive a nerve block during your surgery, you will need to start taking your pain medication shortly after your surgery and should continue to do so as prescribed by your surgeon.  Pain Medication:  Most commonly we prescribe Vicodin and Percocet for post-operative pain. Both of these medications contain a combination of acetaminophen (Tylenol) and a narcotic to help control pain.   It takes between 30 and 45 minutes before pain medication starts to work. It is important to take your medication before your pain level gets too intense.   Nausea is a common side effect of many pain medications. You will want to eat something before taking your pain medicine to help prevent nausea.   If you are taking a prescription pain medication that contains acetaminophen, we recommend that you do not take additional over the counter acetaminophen (Tylenol).  Other pain relieving options:   Using a cold pack to ice the affected area a few times a day (15 to 20 minutes at a time) can help to relieve pain, reduce swelling and bruising.   Elevation of the affected area can also help to reduce pain and swelling.   Post Anesthesia Home Care Instructions  Activity: Get plenty of rest for the remainder of the day. A responsible individual must stay with you for 24 hours following the procedure.  For the next 24 hours, DO NOT: -Drive a car -Paediatric nurse -Drink alcoholic beverages -Take any medication unless instructed by your physician -Make any legal decisions or sign important papers.  Meals: Start with liquid foods such  as gelatin or soup. Progress to regular foods as tolerated. Avoid greasy, spicy, heavy foods. If nausea and/or vomiting occur, drink only clear liquids until the nausea and/or vomiting subsides. Call your physician if vomiting continues.  Special Instructions/Symptoms: Your throat may feel dry or sore from the anesthesia or the breathing tube  placed in your throat during surgery. If this causes discomfort, gargle with warm salt water. The discomfort should disappear within 24 hours.  If you had a scopolamine patch placed behind your ear for the management of post- operative nausea and/or vomiting:  1. The medication in the patch is effective for 72 hours, after which it should be removed.  Wrap patch in a tissue and discard in the trash. Wash hands thoroughly with soap and water. 2. You may remove the patch earlier than 72 hours if you experience unpleasant side effects which may include dry mouth, dizziness or visual disturbances. 3. Avoid touching the patch. Wash your hands with soap and water after contact with the patch.    Regional Anesthesia Blocks  1. Numbness or the inability to move the "blocked" extremity may last from 3-48 hours after placement. The length of time depends on the medication injected and your individual response to the medication. If the numbness is not going away after 48 hours, call your surgeon.  2. The extremity that is blocked will need to be protected until the numbness is gone and the  Strength has returned. Because you cannot feel it, you will need to take extra care to avoid injury. Because it may be weak, you may have difficulty moving it or using it. You may not know what position it is in without looking at it while the block is in effect.  3. For blocks in the legs and feet, returning to weight bearing and walking needs to be done carefully. You will need to wait until the numbness is entirely gone and the strength has returned. You should be able to move your leg and foot normally before you try and bear weight or walk. You will need someone to be with you when you first try to ensure you do not fall and possibly risk injury.  4. Bruising and tenderness at the needle site are common side effects and will resolve in a few days.  5. Persistent numbness or new problems with movement should be  communicated to the surgeon or the [facility] [phone] [place] [phone]).

## 2019-01-10 NOTE — Anesthesia Procedure Notes (Signed)
Anesthesia Regional Block: Adductor canal block   Pre-Anesthetic Checklist: ,, timeout performed, Correct Patient, Correct Site, Correct Laterality, Correct Procedure, Correct Position, site marked, Risks and benefits discussed,  Surgical consent,  Pre-op evaluation,  At surgeon's request and post-op pain management  Laterality: Left  Prep: Maximum Sterile Barrier Precautions used, chloraprep       Needles:  Injection technique: Single-shot  Needle Type: Echogenic Stimulator Needle     Needle Length: 9cm  Needle Gauge: 22     Additional Needles:   Procedures:,,,, ultrasound used (permanent image in chart),,,,  Narrative:  Start time: 01/10/2019 11:07 AM End time: 01/10/2019 11:17 AM Injection made incrementally with aspirations every 5 mL.  Performed by: Personally  Anesthesiologist: Freddrick March, MD  Additional Notes: Monitors applied. No increased pain on injection. No increased resistance to injection. Injection made in 5cc increments. Good needle visualization. Patient tolerated procedure well.

## 2019-01-11 ENCOUNTER — Encounter (HOSPITAL_BASED_OUTPATIENT_CLINIC_OR_DEPARTMENT_OTHER): Payer: Self-pay | Admitting: Orthopaedic Surgery

## 2019-01-24 ENCOUNTER — Ambulatory Visit (INDEPENDENT_AMBULATORY_CARE_PROVIDER_SITE_OTHER): Payer: Medicare Other | Admitting: Orthopaedic Surgery

## 2019-01-24 ENCOUNTER — Encounter: Payer: Self-pay | Admitting: Orthopaedic Surgery

## 2019-01-24 ENCOUNTER — Ambulatory Visit (INDEPENDENT_AMBULATORY_CARE_PROVIDER_SITE_OTHER): Payer: Medicare Other

## 2019-01-24 ENCOUNTER — Other Ambulatory Visit: Payer: Self-pay

## 2019-01-24 DIAGNOSIS — M25562 Pain in left knee: Secondary | ICD-10-CM | POA: Diagnosis not present

## 2019-01-24 DIAGNOSIS — G8929 Other chronic pain: Secondary | ICD-10-CM | POA: Diagnosis not present

## 2019-01-24 NOTE — Progress Notes (Signed)
   Post-Op Visit Note   Patient: Zachary [name]           Date of Birth: 1945/12/24           MRN: [id number] Visit Date: 01/24/2019 PCP: [name], MD   Assessment & Plan:  Chief Complaint:  Chief Complaint  Patient presents with  . Left Knee - Pain, Routine Post Op   Visit Diagnoses:  1. Chronic pain of left knee     Plan: Zachary [name] is 2-week status post ORIF left patella fracture.  He is doing well overall.  He has not taken any pain medicines in the last few days.  He has been using ice regularly.  No complaints today.  Physical exam shows a healed surgical incision.  No signs of infection.  He does have significant amount of swelling and bruising.  No drainage.  X-rays demonstrate stable fixation of the fracture without any complications.  Sutures were removed today and Steri-Strips placed.  I opened up his Bledsoe brace to 30 degrees of flexion.  He may weight-bear as tolerated in the brace.  Recheck in 4 weeks with two-view x-rays of the left knee.  Follow-Up Instructions: Return in about 4 weeks (around 02/21/2019).   Orders:  Orders Placed This Encounter  Procedures  . XR Knee 1-2 Views Left   No orders of the defined types were placed in this encounter.   Imaging: Xr Knee 1-2 Views Left  Result Date: 01/24/2019 Stable fixation and alignment of the patella fracture.   PMFS History: Patient Active Problem List   Diagnosis Date Noted  . Quadriceps tendon rupture, left, initial encounter 01/10/2019  . Closed comminuted fracture of left patella, initial encounter 01/08/2019   Past Medical History:  Diagnosis Date  . Diabetes mellitus   . Hypercholesteremia   . Hypertension   . Patella fracture    left  . PVD (peripheral vascular disease) ([place])     History reviewed. No pertinent family history.  Past Surgical History:  Procedure Laterality Date  . AMPUTATION    . AMPUTATION  11/28/2011   Procedure: AMPUTATION RAY;  Surgeon: [name], MD;   Location: [facility];  Service: Orthopedics;  Laterality: Left;  left 2nd toe amputation  . ORIF PATELLA Left 01/10/2019   Procedure: OPEN REDUCTION INTERNAL (ORIF) FIXATION LEFT PATELLA FRACTURE;  Surgeon: [name], MD;  Location: [place];  Service: Orthopedics;  Laterality: Left;   Social History   Occupational History  . Not on file  Tobacco Use  . Smoking status: Never Smoker  . Smokeless tobacco: Never Used  Substance and Sexual Activity  . Alcohol use: No  . Drug use: No  . Sexual activity: Not on file

## 2019-02-26 ENCOUNTER — Ambulatory Visit (INDEPENDENT_AMBULATORY_CARE_PROVIDER_SITE_OTHER): Payer: Medicare Other | Admitting: Orthopaedic Surgery

## 2019-02-26 ENCOUNTER — Encounter: Payer: Self-pay | Admitting: Orthopaedic Surgery

## 2019-02-26 ENCOUNTER — Other Ambulatory Visit: Payer: Self-pay

## 2019-02-26 ENCOUNTER — Ambulatory Visit (INDEPENDENT_AMBULATORY_CARE_PROVIDER_SITE_OTHER): Payer: Medicare Other

## 2019-02-26 VITALS — Ht 68.0 in | Wt 200.0 lb

## 2019-02-26 DIAGNOSIS — S82042A Displaced comminuted fracture of left patella, initial encounter for closed fracture: Secondary | ICD-10-CM

## 2019-02-26 NOTE — Progress Notes (Signed)
   Post-Op Visit Note   Patient: Zachary [name]           Date of Birth: Jul 23, 1945           MRN: [id number] Visit Date: 02/26/2019 PCP: [name], MD   Assessment & Plan:  Chief Complaint:  Chief Complaint  Patient presents with  . Left Knee - Routine Post Op    ORIF left patella fracture DOS 01/10/2019   Visit Diagnoses:  1. Closed comminuted fracture of left patella, initial encounter     Plan: Lajon is almost 7 weeks status post ORIF left comminuted patella fracture.  He is doing well overall.  Reports no pain. He is eager to get rid of the Bledsoe brace.  He has been ambulating with a walker.  He was not using a walker prior to the fracture.  Surgical scar is fully healed.  He has some mild swelling.  Range of motion is slightly limited from immobilization.  X-rays demonstrate stable fixation and alignment of the fracture with evidence of healing.  There is no displacement of the fracture fragments.  At this point we will discontinue the Bledsoe brace.  Referral to outpatient PT for knee range of motion, strengthening, or gait training.  We will see him back in 6 weeks with two-view x-rays of the left knee.  Follow-Up Instructions: Return in about 6 weeks (around 04/09/2019).   Orders:  Orders Placed This Encounter  Procedures  . XR Knee 1-2 Views Left  . Ambulatory referral to Physical Therapy   No orders of the defined types were placed in this encounter.   Imaging: Xr Knee 1-2 Views Left  Result Date: 02/26/2019 Stable fixation of patella fracture.  Fracture fragments are stable and demonstrate healing.   PMFS History: Patient Active Problem List   Diagnosis Date Noted  . Quadriceps tendon rupture, left, initial encounter 01/10/2019  . Closed comminuted fracture of left patella, initial encounter 01/08/2019   Past Medical History:  Diagnosis Date  . Diabetes mellitus   . Hypercholesteremia   . Hypertension   . Patella fracture    left  . PVD  (peripheral vascular disease) ([place])     History reviewed. No pertinent family history.  Past Surgical History:  Procedure Laterality Date  . AMPUTATION    . 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 ORIF PATELLA Left 01/10/2019   Procedure: OPEN REDUCTION INTERNAL (ORIF) FIXATION LEFT PATELLA FRACTURE;  Surgeon: [name], MD;  Location: [place];  Service: Orthopedics;  Laterality: Left;   Social History   Occupational History  . Not on file  Tobacco Use  . Smoking status: Never Smoker  . Smokeless tobacco: Never Used  Substance and Sexual Activity  . Alcohol use: No  . Drug use: No  . Sexual activity: Not on file

## 2019-03-06 ENCOUNTER — Telehealth: Payer: Self-pay | Admitting: Orthopaedic Surgery

## 2019-03-06 MED ORDER — METHOCARBAMOL 750 MG PO TABS: 750.0000 mg | ORAL_TABLET | Freq: Two times a day (BID) | ORAL | 0 refills | Status: DC | PRN

## 2019-03-06 NOTE — Telephone Encounter (Signed)
Received call from patient's wife [name]) called advised patient need Rx refilled (Methocarbamol) The number to contact [name] is [phone]

## 2019-03-18 ENCOUNTER — Encounter: Payer: Self-pay | Admitting: Physical Therapy

## 2019-03-18 ENCOUNTER — Ambulatory Visit (INDEPENDENT_AMBULATORY_CARE_PROVIDER_SITE_OTHER): Payer: Medicare Other | Admitting: Physical Therapy

## 2019-03-18 ENCOUNTER — Other Ambulatory Visit: Payer: Self-pay

## 2019-03-18 DIAGNOSIS — R600 Localized edema: Secondary | ICD-10-CM

## 2019-03-18 DIAGNOSIS — M25562 Pain in left knee: Secondary | ICD-10-CM

## 2019-03-18 DIAGNOSIS — M25662 Stiffness of left knee, not elsewhere classified: Secondary | ICD-10-CM

## 2019-03-18 DIAGNOSIS — R2689 Other abnormalities of gait and mobility: Secondary | ICD-10-CM | POA: Diagnosis not present

## 2019-03-18 DIAGNOSIS — M6281 Muscle weakness (generalized): Secondary | ICD-10-CM | POA: Diagnosis not present

## 2019-03-18 NOTE — Therapy (Signed)
[facility] [address] [place], [place], [address] Phone: [phone]   Fax:  [phone]  Physical Therapy Evaluation  Patient Details  Name: Zachary [name] MRN: [id number] Date of Birth: Sep 08, 1945 Referring Provider (PT): [name], MD   Encounter Date: 03/18/2019  PT End of Session - 03/18/19 1427    Visit Number  1    Number of Visits  16    Date for PT Re-Evaluation  05/13/19    PT Start Time  [id number]    PT Stop Time  1425    PT Time Calculation (min)  27 min    Activity Tolerance  Patient tolerated treatment well    Behavior During Therapy  [facility] for tasks assessed/performed       Past Medical History:  Diagnosis Date  . Diabetes mellitus   . Hypercholesteremia   . Hypertension   . Patella fracture    left  . PVD (peripheral vascular disease) ([place])     Past Surgical History:  Procedure Laterality Date  . AMPUTATION    . 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 ORIF PATELLA Left 01/10/2019   Procedure: OPEN REDUCTION INTERNAL (ORIF) FIXATION LEFT PATELLA FRACTURE;  Surgeon: [name], MD;  Location: [place];  Service: Orthopedics;  Laterality: Left;    There were no vitals filed for this visit.   Subjective Assessment - 03/18/19 1400    Subjective  Pt is a 73 y/o male who presents to OPPT s/p Lt patella ORIF following a fall.  Pt had surgery 01/10/2019, and reports c/o of continued pain, decreased strength and ROM affecting function.    Limitations  Standing;Walking    Patient Stated Goals  "I want to get my leg straight."    Currently in Pain?  Yes    Pain Score  5    up to 10/10, at best 0/10   Pain Location  Knee    Pain Orientation  Left    Pain Descriptors / Indicators  Sore    Pain Type  Surgical pain    Pain Onset  More than a month ago    Pain Frequency   Intermittent    Aggravating Factors   walking, standing    Pain Relieving Factors  medication, ice         [facility] PT Assessment - 03/18/19 1403      Assessment   Medical Diagnosis  Lt patella ORIF    Referring Provider (PT)  [name], MD    Onset Date/Surgical Date  01/10/19    Hand Dominance  Right    Next MD Visit  04/16/2019    Prior Therapy  none      Precautions   Precautions  None      Restrictions   Weight Bearing Restrictions  No      Balance Screen   Has the patient fallen in the past 6 months  Yes    How many times?  1    Has the patient had a decrease in activity level because of a fear of falling?   No    Is the patient reluctant to leave their home because of a fear of falling?   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[employer], fishing; no regular exercise      Cognition   Overall Cognitive Status  Within Functional Limits for tasks assessed      ROM / Strength   AROM / PROM / Strength  AROM;PROM;Strength      AROM   AROM Assessment Site  Knee    Right/Left Knee  Right;Left    Right Knee Extension  0    Right Knee Flexion  120    Left Knee Extension  -6    Left Knee Flexion  76      PROM   PROM Assessment Site  Knee    Right/Left Knee  Left    Left Knee Extension  -2    Left Knee Flexion  83      Strength   Overall Strength Comments  RLE 5/5; LLE grossly 3-4/5    Strength Assessment Site  Hip;Knee      Palpation   Patella mobility  limited mobility Lt patella; mild edema noted around patella      Ambulation/Gait   Gait Pattern   Decreased stance time - left;Decreased step length - right;Decreased hip/knee flexion - left;Antalgic                Objective measurements completed on examination: See above findings.      [facility] Adult PT Treatment/Exercise - 03/18/19 1403      Exercises   Exercises  Knee/Hip      Knee/Hip Exercises: Seated   Long Arc Quad  Left;5 reps      Knee/Hip Exercises: Supine   Quad Sets  Left;5 reps    Short Arc Quad Sets  Left;5 reps    Heel Slides  AAROM;Left;5 reps    Straight Leg Raises  Left;5 reps             PT Education - 03/18/19 1427    Education Details  HEP    Person(s) Educated  Patient    Methods  Explanation;Demonstration;Handout    Comprehension  Verbalized understanding;Returned demonstration;Need further instruction       PT Short Term Goals - 03/18/19 1519      PT SHORT TERM GOAL #1   Title  independent with HEP    Status  New    Target Date  04/15/19      PT SHORT TERM GOAL #2   Title  improve Lt knee PROM 0-100 for improved function    Status  New    Target Date  04/29/19        PT Long Term Goals - 03/18/19 1520      PT LONG TERM GOAL #1   Title  independent with advanced HEP    Status  New    Target Date  05/13/19      PT LONG TERM GOAL #2   Title  improve Lt knee AROM 0-110 for improved function    Status  New    Target Date  05/13/19      PT LONG TERM GOAL #  3   Title  LLE strength improved to at least 4/5 for improved strength and function    Status  New    Target Date  05/13/19      PT LONG TERM GOAL #4   Title  amb without AD independently without gait deviations for improved mobility    Status  New    Target Date  05/13/19      PT LONG TERM GOAL #5   Title  report pain < 3/10 with activity for improved function    Status  New    Target Date  05/13/19             Plan - 03/18/19 1427    Clinical Impression Statement  Pt is a 73 y/o male who presents to OPPT s/p Lt patella ORIF on 01/10/2019.  Pt  demonstrates decreased ROM and strength, as well as gait abnormalities and increased pain affecting functional mobility.  Pt will benefit from PT to address deficits listed.    Personal Factors and Comorbidities  Time since onset of injury/illness/exacerbation;Comorbidity 3+    Comorbidities  PVD, HTN, DM    Examination-Activity Limitations  Stand;Locomotion Level;Bend;Stairs;Squat;Sleep    Examination-Participation Restrictions  Yard Work;Driving    Stability/Clinical Decision Making  Evolving/Moderate complexity    Clinical Decision Making  Moderate    Rehab Potential  Good    PT Frequency  2x / week    PT Duration  8 weeks    PT Treatment/Interventions  ADLs/Self Care Home Management;Cryotherapy;Electrical Stimulation;Ultrasound;Moist Heat;Iontophoresis 4mg /ml Dexamethasone;Gait training;Stair training;Functional mobility training;Therapeutic activities;Therapeutic exercise;Balance training;Patient/family education;Neuromuscular re-education;Manual techniques;Scar mobilization;Vasopneumatic Device;Taping;Dry needling;Passive range of motion    PT Next Visit Plan  review HEP, aggressive ROM, gait training with cane    PT Home Exercise Plan  Access Code: [email or web address]    Consulted and Agree with Plan of Care  Patient       Patient will benefit from skilled therapeutic intervention in order to improve the following deficits and impairments:  Abnormal gait, Pain, Increased muscle spasms, Increased fascial restricitons, Decreased mobility, Decreased activity tolerance, Decreased range of motion, Decreased strength, Impaired flexibility, Difficulty walking, Decreased balance  Visit Diagnosis: Acute pain of left knee - Plan: PT plan of care cert/re-cert  Stiffness of left knee, not elsewhere classified - Plan: PT plan of care cert/re-cert  Other abnormalities of gait and mobility - Plan: PT plan of care cert/re-cert  Localized edema - Plan: PT plan of care cert/re-cert  Muscle weakness  (generalized) - Plan: PT plan of care cert/re-cert     Problem List Patient Active Problem List   Diagnosis Date Noted  . Quadriceps tendon rupture, left, initial encounter 01/10/2019  . Closed comminuted fracture of left patella, initial encounter 01/08/2019      Laureen Abrahams, PT, DPT 03/18/19 3:24 PM     [facility] [address] [place], [place], [address] Phone: [phone]   Fax:  [phone]  Name: JISSIE DYER MRN: [id number] Date of Birth: 1946-03-29

## 2019-03-18 NOTE — Patient Instructions (Signed)
Access Code: [email or web address]  URL: [email or web address]  Date: 03/18/2019  Prepared by: Faustino Congress   Exercises Supine Quadricep Sets - 15 reps - 3 sets - 1x daily - 7x weekly Supine Active Straight Leg Raise - 15 reps - 3 sets - 1x daily - 7x weekly Supine Short Arc Quad - 15 reps - 3 sets - 5 sec hold - 1x daily - 7x weekly Supine Heel Slide with Strap - 10 reps - 3 sets - 1x daily - 7x weekly Seated Long Arc Quad - 15 reps - 3 sets - 1x daily - 7x weekly

## 2019-03-20 ENCOUNTER — Other Ambulatory Visit: Payer: Self-pay

## 2019-03-20 ENCOUNTER — Encounter: Payer: Self-pay | Admitting: Physical Therapy

## 2019-03-20 ENCOUNTER — Ambulatory Visit (INDEPENDENT_AMBULATORY_CARE_PROVIDER_SITE_OTHER): Payer: Medicare Other | Admitting: Physical Therapy

## 2019-03-20 DIAGNOSIS — M6281 Muscle weakness (generalized): Secondary | ICD-10-CM | POA: Diagnosis not present

## 2019-03-20 DIAGNOSIS — R600 Localized edema: Secondary | ICD-10-CM | POA: Diagnosis not present

## 2019-03-20 DIAGNOSIS — R2689 Other abnormalities of gait and mobility: Secondary | ICD-10-CM

## 2019-03-20 DIAGNOSIS — M25562 Pain in left knee: Secondary | ICD-10-CM | POA: Diagnosis not present

## 2019-03-20 DIAGNOSIS — M25662 Stiffness of left knee, not elsewhere classified: Secondary | ICD-10-CM

## 2019-03-20 NOTE — Therapy (Signed)
[facility] [address] [place], [place], [address] Phone: [phone]   Fax:  [phone]  Physical Therapy Treatment  Patient Details  Name: Zachary [name] MRN: [id number] Date of Birth: 10-22-1945 Referring Provider (PT): [name], MD   Encounter Date: 03/20/2019  PT End of Session - 03/20/19 1529    Visit Number  2    Number of Visits  16    Date for PT Re-Evaluation  05/13/19    PT Start Time  1450    PT Stop Time  1535    PT Time Calculation (min)  45 min    Activity Tolerance  Patient tolerated treatment well    Behavior During Therapy  [facility] for tasks assessed/performed       Past Medical History:  Diagnosis Date  . Diabetes mellitus   . Hypercholesteremia   . Hypertension   . Patella fracture    left  . PVD (peripheral vascular disease) ([place])     Past Surgical History:  Procedure Laterality Date  . AMPUTATION    . 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 ORIF PATELLA Left 01/10/2019   Procedure: OPEN REDUCTION INTERNAL (ORIF) FIXATION LEFT PATELLA FRACTURE;  Surgeon: [name], MD;  Location: [place];  Service: Orthopedics;  Laterality: Left;    There were no vitals filed for this visit.  Subjective Assessment - 03/20/19 1518    Subjective  relays his knee is hurting bad today about 5/10 pain, I think something might be wrong in there.    Pertinent History  Lt knee patella ORIF    Limitations  Standing;Walking    Patient Stated Goals  "I want to get my leg straight."    Pain Onset  More than a month ago       [facility] Adult PT Treatment/Exercise - 03/20/19 0001      Knee/Hip Exercises: Stretches   Active Hamstring Stretch  Left;2 reps;30 seconds    Quad Stretch  Left;3 reps;30 seconds    Quad Stretch Limitations  passive, pt in prone with towel roll under  thigh    Knee: Self-Stretch Limitations  5 sec X 10 reps lunge stretch with Lt foot on 6 inch step      Knee/Hip Exercises: Aerobic   Nustep  6 min L6 UE/LE for knee ROM      Knee/Hip Exercises: Seated   Long Arc Quad  Left;15 reps    Hamstring Curl  Left;15 reps    Hamstring Limitations  L2 band      Knee/Hip Exercises: Supine   Quad Sets  Left;10 reps    Short Arc Quad Sets  Left;10 reps    Heel Slides  AAROM;Left;10 reps    Straight Leg Raises  Left;10 reps      Modalities   Modalities  Vasopneumatic      Vasopneumatic   Number Minutes Vasopneumatic   10 minutes    Vasopnuematic Location   Knee    Vasopneumatic Pressure  Low    Vasopneumatic Temperature   34 deg      Manual Therapy   Manual therapy comments  STM and scar massage to quads and H.S. knee PROM flexion and extension             PT Education - 03/20/19 1529    Education Details  instructions  to stay in pain free ROM with exercises and stretches    Person(s) Educated  Patient    Methods  Explanation    Comprehension  Verbalized understanding       PT Short Term Goals - 03/18/19 1519      PT SHORT TERM GOAL #1   Title  independent with HEP    Status  New    Target Date  04/15/19      PT SHORT TERM GOAL #2   Title  improve Lt knee PROM 0-100 for improved function    Status  New    Target Date  04/29/19        PT Long Term Goals - 03/18/19 1520      PT LONG TERM GOAL #1   Title  independent with advanced HEP    Status  New    Target Date  05/13/19      PT LONG TERM GOAL #2   Title  improve Lt knee AROM 0-110 for improved function    Status  New    Target Date  05/13/19      PT LONG TERM GOAL #3   Title  LLE strength improved to at least 4/5 for improved strength and function    Status  New    Target Date  05/13/19      PT LONG TERM GOAL #4   Title  amb without AD independently without gait deviations for improved mobility    Status  New    Target Date  05/13/19      PT LONG  TERM GOAL #5   Title  report pain < 3/10 with activity for improved function    Status  New    Target Date  05/13/19            Plan - 03/20/19 1529    Clinical Impression Statement  Session focused primarily on knee ROM and beginning strength exercises to tolerance. He received vasopnuematic  post tx to reduce pain and edema in his Lt knee. Continue POC    Personal Factors and Comorbidities  Time since onset of injury/illness/exacerbation;Comorbidity 3+    Comorbidities  PVD, HTN, DM    Examination-Activity Limitations  Stand;Locomotion Level;Bend;Stairs;Squat;Sleep    Examination-Participation Restrictions  Yard Work;Driving    Stability/Clinical Decision Making  Evolving/Moderate complexity    Rehab Potential  Good    PT Frequency  2x / week    PT Duration  8 weeks    PT Treatment/Interventions  ADLs/Self Care Home Management;Cryotherapy;Electrical Stimulation;Ultrasound;Moist Heat;Iontophoresis 4mg /ml Dexamethasone;Gait training;Stair training;Functional mobility training;Therapeutic activities;Therapeutic exercise;Balance training;Patient/family education;Neuromuscular re-education;Manual techniques;Scar mobilization;Vasopneumatic Device;Taping;Dry needling;Passive range of motion    PT Next Visit Plan  review HEP, aggressive ROM, gait training with cane    PT Home Exercise Plan  Access Code: [email or web address]    Consulted and Agree with Plan of Care  Patient       Patient will benefit from skilled therapeutic intervention in order to improve the following deficits and impairments:  Abnormal gait, Pain, Increased muscle spasms, Increased fascial restricitons, Decreased mobility, Decreased activity tolerance, Decreased range of motion, Decreased strength, Impaired flexibility, Difficulty walking, Decreased balance  Visit Diagnosis: Acute pain of left knee  Stiffness of left knee, not elsewhere classified  Other abnormalities of gait and mobility  Localized edema  Muscle weakness  (generalized)     Problem List Patient Active Problem List   Diagnosis Date Noted  . Quadriceps tendon rupture, left, initial encounter 01/10/2019  . Closed comminuted  fracture of left patella, initial encounter 01/08/2019    Silvestre Mesi 03/20/2019, 3:31 PM  [facility] [address] [place], [place], [address] Phone: [phone]   Fax:  [phone]  Name: Zachary [name] MRN: [id number] Date of Birth: Nov 12, 1945

## 2019-03-26 ENCOUNTER — Encounter: Payer: Self-pay | Admitting: Physical Therapy

## 2019-03-26 ENCOUNTER — Ambulatory Visit (INDEPENDENT_AMBULATORY_CARE_PROVIDER_SITE_OTHER): Payer: Medicare Other | Admitting: Physical Therapy

## 2019-03-26 ENCOUNTER — Other Ambulatory Visit: Payer: Self-pay

## 2019-03-26 DIAGNOSIS — M6281 Muscle weakness (generalized): Secondary | ICD-10-CM | POA: Diagnosis not present

## 2019-03-26 DIAGNOSIS — R2689 Other abnormalities of gait and mobility: Secondary | ICD-10-CM | POA: Diagnosis not present

## 2019-03-26 DIAGNOSIS — M25662 Stiffness of left knee, not elsewhere classified: Secondary | ICD-10-CM

## 2019-03-26 DIAGNOSIS — R600 Localized edema: Secondary | ICD-10-CM

## 2019-03-26 NOTE — Therapy (Signed)
[facility] Physical Therapy [address] [place], [place], [address] Phone: [phone]   Fax:  [phone]  Physical Therapy Treatment  Patient Details  Name: Zachary [name] MRN: [id number] Date of Birth: 1945/11/20 Referring Provider (PT): [name], MD   Encounter Date: 03/26/2019  PT End of Session - 03/26/19 2212    Visit Number  3    Number of Visits  16    Date for PT Re-Evaluation  05/13/19    PT Start Time  [id number]    PT Stop Time  1535    PT Time Calculation (min)  49 min    Activity Tolerance  Patient tolerated treatment well    Behavior During Therapy  [facility] for tasks assessed/performed       Past Medical History:  Diagnosis Date  . Diabetes mellitus   . Hypercholesteremia   . Hypertension   . Patella fracture    left  . PVD (peripheral vascular disease) ([place])     Past Surgical History:  Procedure Laterality Date  . AMPUTATION    . 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 ORIF PATELLA Left 01/10/2019   Procedure: OPEN REDUCTION INTERNAL (ORIF) FIXATION LEFT PATELLA FRACTURE;  Surgeon: [name], MD;  Location: [place];  Service: Orthopedics;  Laterality: Left;    There were no vitals filed for this visit.  Subjective Assessment - 03/26/19 1445    Subjective  He has been doing his exercises but leg is stiff afterwards. He is sitting in recliner ~hour.    Pertinent History  Lt knee patella ORIF    Limitations  Standing;Walking    Patient Stated Goals  "I want to get my leg straight."    Currently in Pain?  Yes    Pain Score  2    no meds last 3 days, worst 5/10 since last PT visit.   Pain Location  Knee    Pain Orientation  Left    Pain Descriptors / Indicators  Dull;Tightness    Pain Type  Chronic pain    Pain Onset  More than a month ago    Pain Frequency  Constant    Aggravating Factors   moving knee, quad activity, strecthing    Pain Relieving Factors  ice                       [facility] Adult PT Treatment/Exercise - 03/26/19 1445      Ambulation/Gait   Ambulation/Gait  Yes    Ambulation/Gait Assistance  4: Min guard;5: Supervision    Ambulation/Gait Assistance Details  verbal cues on cane location contralateral UE and sequencing with LLE.     Ambulation Distance (Feet)  75 Feet    Assistive device  Straight cane    Gait Pattern  Step-through pattern;Decreased arm swing - left;Decreased step length - right;Decreased stance time - left;Decreased hip/knee flexion - left;Antalgic    Ambulation Surface  Indoor;Level      Knee/Hip Exercises: Stretches   Active Hamstring Stretch  Left;2 reps;30 seconds    Quad Stretch  Left;3 reps;30 seconds   3 reps both supine & prone    Quad Stretch Limitations  passive, supine with LE over edge for hip ext and prone with towel roll above knee.     Knee: Self-Stretch to increase Flexion  Left;5  reps;10 seconds    Knee: Self-Stretch Limitations  supine with hip ext active knee flexion with tactile cues to control hip external rotation.       Knee/Hip Exercises: Aerobic   Nustep  BUEs & BLEs 2 minutes level 3 to warmup with emphasis on maximal flexion/extension then 6 min L6 with full ROM working on strength      Knee/Hip Exercises: Standing   Heel Raises  Both;10 reps;5 seconds   BUE support RW cues to weight bearing   Heel Raises Limitations  verbal cues on full ROM      Knee/Hip Exercises: Seated   Long Arc Quad  Left;15 reps;Strengthening   using gait belt to assist terminal extension   Hamstring Curl  Left;15 reps    Hamstring Limitations  L2 band      Knee/Hip Exercises: Supine   Quad Sets  Left;10 reps;Strengthening    Quad Sets Limitations  tactile & verbal cues to facilitate increased quad contraction    Short Arc Quad Sets  Left;10 reps    Heel Slides  AAROM;Left;10 reps    Straight Leg  Raises  Left;10 reps      Knee/Hip Exercises: Prone   Hamstring Curl  10 reps;5 seconds    Hamstring Curl Limitations  tactile & verbal cues to limit external rotation    Prone Knee Hang  2 minutes    Prone Knee Hang Limitations  limiting hip external rotation      Modalities   Modalities  Vasopneumatic      Vasopneumatic   Number Minutes Vasopneumatic   10 minutes    Vasopnuematic Location   Knee    Vasopneumatic Pressure  Medium    Vasopneumatic Temperature   34 deg      Manual Therapy   Manual therapy comments  STM and scar massage to quads and H.S. knee PROM flexion and extension             PT Education - 03/26/19 1500    Education Details  arthritic pain with stiffness or overdoing current activity level; PT recommended changing knee position every 5 minutes especially after exercises and elevation with ice / ankle pumps.    Person(s) Educated  Patient    Methods  Explanation;Verbal cues    Comprehension  Verbalized understanding;Verbal cues required;Need further instruction       PT Short Term Goals - 03/18/19 1519      PT SHORT TERM GOAL #1   Title  independent with HEP    Status  New    Target Date  04/15/19      PT SHORT TERM GOAL #2   Title  improve Lt knee PROM 0-100 for improved function    Status  New    Target Date  04/29/19        PT Long Term Goals - 03/18/19 1520      PT LONG TERM GOAL #1   Title  independent with advanced HEP    Status  New    Target Date  05/13/19      PT LONG TERM GOAL #2   Title  improve Lt knee AROM 0-110 for improved function    Status  New    Target Date  05/13/19      PT LONG TERM GOAL #3   Title  LLE strength improved to at least 4/5 for improved strength and function    Status  New    Target Date  05/13/19  PT LONG TERM GOAL #4   Title  amb without AD independently without gait deviations for improved mobility    Status  New    Target Date  05/13/19      PT LONG TERM GOAL #5   Title  report pain  < 3/10 with activity for improved function    Status  New    Target Date  05/13/19            Plan - 03/26/19 2214    Clinical Impression Statement  PT worked on knee ROM & strength with manual & verbal cues. PT also worked on gait with cane as patient reported using at home. He improved with skilled instruction in proper cane use. Use of vasopneumatic device s/p exercise to address edema.    Personal Factors and Comorbidities  Time since onset of injury/illness/exacerbation;Comorbidity 3+    Comorbidities  PVD, HTN, DM    Examination-Activity Limitations  Stand;Locomotion Level;Bend;Stairs;Squat;Sleep    Examination-Participation Restrictions  Yard Work;Driving    Stability/Clinical Decision Making  Evolving/Moderate complexity    Rehab Potential  Good    PT Frequency  2x / week    PT Duration  8 weeks    PT Treatment/Interventions  ADLs/Self Care Home Management;Cryotherapy;Electrical Stimulation;Ultrasound;Moist Heat;Iontophoresis 4mg /ml Dexamethasone;Gait training;Stair training;Functional mobility training;Therapeutic activities;Therapeutic exercise;Balance training;Patient/family education;Neuromuscular re-education;Manual techniques;Scar mobilization;Vasopneumatic Device;Taping;Dry needling;Passive range of motion    PT Next Visit Plan  review HEP, aggressive ROM, gait training with cane    PT Home Exercise Plan  Access Code: [email or web address]    Consulted and Agree with Plan of Care  Patient       Patient will benefit from skilled therapeutic intervention in order to improve the following deficits and impairments:  Abnormal gait, Pain, Increased muscle spasms, Increased fascial restricitons, Decreased mobility, Decreased activity tolerance, Decreased range of motion, Decreased strength, Impaired flexibility, Difficulty walking, Decreased balance  Visit Diagnosis: Other abnormalities of gait and mobility  Muscle weakness  Localized edema  Stiffness of left knee, not elsewhere  classified     Problem List Patient Active Problem List   Diagnosis Date Noted  . Quadriceps tendon rupture, left, initial encounter 01/10/2019  . Closed comminuted fracture of left patella, initial encounter 01/08/2019    Jamey Reas PT, DPT 03/26/2019, 10:17 PM  [facility] Physical Therapy [address] [place], [place], [address] Phone: [phone]   Fax:  [phone]  Name: RASHAAN LOVEN MRN: [id number] Date of Birth: January 30, 1946

## 2019-03-28 ENCOUNTER — Encounter: Payer: Medicare Other | Admitting: Physical Therapy

## 2019-04-02 ENCOUNTER — Ambulatory Visit (INDEPENDENT_AMBULATORY_CARE_PROVIDER_SITE_OTHER): Payer: Medicare Other | Admitting: Physical Therapy

## 2019-04-02 ENCOUNTER — Other Ambulatory Visit: Payer: Self-pay

## 2019-04-02 DIAGNOSIS — R600 Localized edema: Secondary | ICD-10-CM | POA: Diagnosis not present

## 2019-04-02 DIAGNOSIS — M6281 Muscle weakness (generalized): Secondary | ICD-10-CM | POA: Diagnosis not present

## 2019-04-02 DIAGNOSIS — R2689 Other abnormalities of gait and mobility: Secondary | ICD-10-CM

## 2019-04-02 DIAGNOSIS — M25662 Stiffness of left knee, not elsewhere classified: Secondary | ICD-10-CM | POA: Diagnosis not present

## 2019-04-02 DIAGNOSIS — M25562 Pain in left knee: Secondary | ICD-10-CM | POA: Diagnosis not present

## 2019-04-02 NOTE — Therapy (Signed)
[facility] Physical Therapy [address] [place], [place], [address] Phone: [phone]   Fax:  [phone]  Physical Therapy Treatment  Patient Details  Name: Zachary [name] MRN: [id number] Date of Birth: 08-22-1945 Referring Provider (PT): [name], MD   Encounter Date: 04/02/2019  PT End of Session - 04/02/19 1541    Visit Number  4    Number of Visits  16    Date for PT Re-Evaluation  05/13/19    PT Start Time  [id number]    PT Stop Time  1525    PT Time Calculation (min)  40 min    Activity Tolerance  Patient tolerated treatment well    Behavior During Therapy  [facility] for tasks assessed/performed       Past Medical History:  Diagnosis Date  . Diabetes mellitus   . Hypercholesteremia   . Hypertension   . Patella fracture    left  . PVD (peripheral vascular disease) ([place])     Past Surgical History:  Procedure Laterality Date  . AMPUTATION    . 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 ORIF PATELLA Left 01/10/2019   Procedure: OPEN REDUCTION INTERNAL (ORIF) FIXATION LEFT PATELLA FRACTURE;  Surgeon: [name], MD;  Location: [facility];  Service: Orthopedics;  Laterality: Left;    There were no vitals filed for this visit.  Subjective Assessment - 04/02/19 1500    Subjective  relays his Lt knee is hurting more today but does not provide number of intensity. Does say he has been working on HEP at home.    Pertinent History  Lt knee patella ORIF    Limitations  Standing;Walking    Patient Stated Goals  "I want to get my leg straight."    Pain Onset  More than a month ago         [facility] PT Assessment - 04/02/19 0001      ROM / Strength   AROM / PROM / Strength  AROM      AROM   Left Knee Extension  -6    Left Knee Flexion  94                   [facility] Adult PT Treatment/Exercise  - 04/02/19 0001      Knee/Hip Exercises: Stretches   Active Hamstring Stretch  Left;2 reps;30 seconds    Quad Stretch  Left;2 reps;30 seconds    Quad Stretch Limitations  supine with leg off EOB using strap    Knee: Self-Stretch Limitations  standing lunge stretch for knee flexion ROM Lt knee 10 sec X 10 reps with foot on 8 inch step      Knee/Hip Exercises: Aerobic   Nustep  bilat LE's X 7 min L6      Knee/Hip Exercises: Standing   Knee Flexion  Both;20 reps    Knee Flexion Limitations  UE support    Hip Flexion  Both;20 reps;Knee bent    Hip Flexion Limitations  UE support    Hip Abduction  Both;20 reps    Abduction Limitations  UE support    Other Standing Knee Exercises  mini squat with bilat UE support on high mat table and chair behind him for safety  X10 reps      Knee/Hip Exercises: Seated   Long Arc [employer]  reps    Long Arc Quad Weight  2 lbs.    Hamstring Curl  Left;20 reps    Hamstring Limitations  L2 band      Knee/Hip Exercises: Supine   Straight Leg Raises  Left;15 reps    Straight Leg Raises Limitations  2 lbs      Manual Therapy   Manual therapy comments  Lt knee PROM, manual stretching for H.S. flexion and ext mobs, patella mobs             PT Education - 04/02/19 1541    Education Details  progressed HEP    Person(s) Educated  Patient    Methods  Explanation;Demonstration;Verbal cues;Handout    Comprehension  Verbalized understanding;Returned demonstration       PT Short Term Goals - 03/18/19 1519      PT SHORT TERM GOAL #1   Title  independent with HEP    Status  New    Target Date  04/15/19      PT SHORT TERM GOAL #2   Title  improve Lt knee PROM 0-100 for improved function    Status  New    Target Date  04/29/19        PT Long Term Goals - 03/18/19 1520      PT LONG TERM GOAL #1   Title  independent with advanced HEP    Status  New    Target Date  05/13/19      PT LONG TERM GOAL #2   Title  improve Lt knee AROM 0-110  for improved function    Status  New    Target Date  05/13/19      PT LONG TERM GOAL #3   Title  LLE strength improved to at least 4/5 for improved strength and function    Status  New    Target Date  05/13/19      PT LONG TERM GOAL #4   Title  amb without AD independently without gait deviations for improved mobility    Status  New    Target Date  05/13/19      PT LONG TERM GOAL #5   Title  report pain < 3/10 with activity for improved function    Status  New    Target Date  05/13/19            Plan - 04/02/19 1544    Clinical Impression Statement  Progressed HEP to add more standing strengthening exercises and he was able to perform these with good tolerance. He is improving with ROM, see updated measurments. He still has decreased ROM, decreased strength, and still using AD for ambulation. He will continue to benefit from PT.    Personal Factors and Comorbidities  Time since onset of injury/illness/exacerbation;Comorbidity 3+    Comorbidities  PVD, HTN, DM    Examination-Activity Limitations  Stand;Locomotion Level;Bend;Stairs;Squat;Sleep    Examination-Participation Restrictions  Yard Work;Driving    Stability/Clinical Decision Making  Evolving/Moderate complexity    Rehab Potential  Good    PT Frequency  2x / week    PT Duration  8 weeks    PT Treatment/Interventions  ADLs/Self Care Home Management;Cryotherapy;Electrical Stimulation;Ultrasound;Moist Heat;Iontophoresis 4mg /ml Dexamethasone;Gait training;Stair training;Functional mobility training;Therapeutic activities;Therapeutic exercise;Balance training;Patient/family education;Neuromuscular re-education;Manual techniques;Scar mobilization;Vasopneumatic Device;Taping;Dry needling;Passive range of motion    PT Next Visit Plan  aggressive ROM, gait training with cane    PT Home Exercise Plan  Access Code: [email or web address], then added standing marches, H.S. curls, hip abd, and  mini squat    Consulted and Agree with Plan of Care   Patient       Patient will benefit from skilled therapeutic intervention in order to improve the following deficits and impairments:  Abnormal gait, Pain, Increased muscle spasms, Increased fascial restricitons, Decreased mobility, Decreased activity tolerance, Decreased range of motion, Decreased strength, Impaired flexibility, Difficulty walking, Decreased balance  Visit Diagnosis: Other abnormalities of gait and mobility  Muscle weakness  Localized edema  Stiffness of left knee, not elsewhere classified  Acute pain of left knee     Problem List Patient Active Problem List   Diagnosis Date Noted  . Quadriceps tendon rupture, left, initial encounter 01/10/2019  . Closed comminuted fracture of left patella, initial encounter 01/08/2019    Silvestre Mesi 04/02/2019, 3:47 PM  [facility] Physical Therapy [address] [place], [place], [address] Phone: [phone]   Fax:  [phone]  Name: Zachary [name] MRN: [id number] Date of Birth: 06-29-1945

## 2019-04-02 NOTE — Patient Instructions (Signed)
Access Code: [email or web address]  URL: [email or web address]  Date: 04/02/2019  Prepared by: Elsie Ra   Exercises  Standing Marching - 10 reps - 1-2 sets - 2x daily - 6x weekly  Standing Knee Flexion - 10 reps - 1-2 sets - 2x daily - 6x weekly  Standing Hip Abduction with Counter Support - 10 reps - 1-2 sets - 2x daily - 6x weekly  Mini Squat with Counter Support - 10 reps - 1-2 sets - 2x daily - 6x weekly

## 2019-04-08 ENCOUNTER — Other Ambulatory Visit: Payer: Self-pay

## 2019-04-08 ENCOUNTER — Ambulatory Visit (INDEPENDENT_AMBULATORY_CARE_PROVIDER_SITE_OTHER): Payer: Medicare Other | Admitting: Physical Therapy

## 2019-04-08 DIAGNOSIS — M25562 Pain in left knee: Secondary | ICD-10-CM

## 2019-04-08 DIAGNOSIS — R600 Localized edema: Secondary | ICD-10-CM | POA: Diagnosis not present

## 2019-04-08 DIAGNOSIS — M25662 Stiffness of left knee, not elsewhere classified: Secondary | ICD-10-CM

## 2019-04-08 DIAGNOSIS — R2689 Other abnormalities of gait and mobility: Secondary | ICD-10-CM | POA: Diagnosis not present

## 2019-04-08 DIAGNOSIS — M6281 Muscle weakness (generalized): Secondary | ICD-10-CM | POA: Diagnosis not present

## 2019-04-08 NOTE — Therapy (Addendum)
[facility] Physical Therapy [address] [place], [place], [address] Phone: [phone]   Fax:  [phone]  Physical Therapy Treatment  Patient Details  Name: Zachary [name] MRN: [id number] Date of Birth: 1946/02/06 Referring Provider (PT): [name], MD   Encounter Date: 04/08/2019  PT End of Session - 04/08/19 1447    Visit Number  5    Number of Visits  16    Date for PT Re-Evaluation  05/13/19    Authorization Type  MCR/BCBS    PT Start Time  1440    PT Stop Time  1533    PT Time Calculation (min)  53 min    Activity Tolerance  Patient tolerated treatment well    Behavior During Therapy  [facility] for tasks assessed/performed       Past Medical History:  Diagnosis Date  . Diabetes mellitus   . Hypercholesteremia   . Hypertension   . Patella fracture    left  . PVD (peripheral vascular disease) ([place])     Past Surgical History:  Procedure Laterality Date  . AMPUTATION    . 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 ORIF PATELLA Left 01/10/2019   Procedure: OPEN REDUCTION INTERNAL (ORIF) FIXATION LEFT PATELLA FRACTURE;  Surgeon: [name], MD;  Location: [facility];  Service: Orthopedics;  Laterality: Left;    There were no vitals filed for this visit.  Subjective Assessment - 04/08/19 1446    Subjective  My knee is giving me a fit today, 6/10 overall pain in Lt knee    Pertinent History  Lt knee patella ORIF    Limitations  Standing;Walking    Patient Stated Goals  "I want to get my leg straight."    Pain Onset  More than a month ago          [facility] Adult PT Treatment/Exercise - 04/08/19 0001      Ambulation/Gait   Gait Comments  gait training with SPC 300 ft with cues and demo for proper reciprocal technique with cane in his Rt hand placing down cane across from left foot with step  through pattern. He has overall decreased step length with Lt LE but is able so show safe appropriate pattern and was encouraged to wean from RW more at home      Knee/Hip Exercises: Stretches   Passive Hamstring Stretch  Left;3 reps;30 seconds    Quad Stretch  Left;3 reps;30 seconds    Quad Stretch Limitations  supine with leg off EOB using strap    Knee: Self-Stretch Limitations  standing lunge stretch for knee flexion ROM Lt knee 10 sec X 10 reps with foot on 8 inch step    Other Knee/Hip Stretches  seated heelslide with overpressure from other leg for flexion 10 sec X 10, then supine heelslides AAROM with strap 5 sec X 20 reps      Knee/Hip Exercises: Aerobic   Nustep  bilat LE's X 7 min L6      Knee/Hip Exercises: Standing   Lateral Step Up  Left;10 reps;Hand Hold: 2;Step Height: 2"    Forward Step Up  Left;15 reps;Hand Hold: 2;Step Height: 4"      Knee/Hip Exercises: Seated   Long Arc Quad  Left;20 reps    Long Arc Quad Weight  3 lbs.    Hamstring Curl  Left;20 reps    Hamstring Limitations  L3 band    Sit to Sand  10 reps;with UE support   from low mat table     Knee/Hip Exercises: Supine   Quad Sets  Left;15 reps    Quad Sets Limitations  hold 5 sec    Straight Leg Raises  Left;15 reps    Straight Leg Raises Limitations  2 lbs      Vasopneumatic   Number Minutes Vasopneumatic   10 minutes    Vasopnuematic Location   Knee    Vasopneumatic Pressure  Medium    Vasopneumatic Temperature   34 deg      Manual Therapy   Manual therapy comments  Lt knee PROM, manual stretching for H.S. flexion and ext mobs, patella mobs               PT Short Term Goals - 03/18/19 1519      PT SHORT TERM GOAL #1   Title  independent with HEP    Status  New    Target Date  04/15/19      PT SHORT TERM GOAL #2   Title  improve Lt knee PROM 0-100 for improved function    Status  New    Target Date  04/29/19        PT Long Term Goals - 03/18/19 1520      PT LONG TERM GOAL  #1   Title  independent with advanced HEP    Status  New    Target Date  05/13/19      PT LONG TERM GOAL #2   Title  improve Lt knee AROM 0-110 for improved function    Status  New    Target Date  05/13/19      PT LONG TERM GOAL #3   Title  LLE strength improved to at least 4/5 for improved strength and function    Status  New    Target Date  05/13/19      PT LONG TERM GOAL #4   Title  amb without AD independently without gait deviations for improved mobility    Status  New    Target Date  05/13/19      PT LONG TERM GOAL #5   Title  report pain < 3/10 with activity for improved function    Status  New    Target Date  05/13/19            Plan - 04/08/19 1526    Clinical Impression Statement  Again able to progress his ROM and strength today but continues to have significant deficits in this area. Gait training provided with SPC in efforts to wean him from RW and he is able to show good safe technique with SPC. PT will continue to progress as able.    Personal Factors and Comorbidities  Time since onset of injury/illness/exacerbation;Comorbidity 3+    Comorbidities  PVD, HTN, DM    Examination-Activity Limitations  Stand;Locomotion Level;Bend;Stairs;Squat;Sleep    Examination-Participation Restrictions  Yard Work;Driving    Stability/Clinical Decision Making  Evolving/Moderate complexity    Rehab Potential  Good    PT Frequency  2x / week    PT Duration  8 weeks    PT Treatment/Interventions  ADLs/Self Care Home Management;Cryotherapy;Electrical Stimulation;Ultrasound;Moist Heat;Iontophoresis 4mg /ml Dexamethasone;Gait training;Stair training;Functional mobility training;Therapeutic activities;Therapeutic exercise;Balance training;Patient/family education;Neuromuscular re-education;Manual techniques;Scar mobilization;Vasopneumatic Device;Taping;Dry needling;Passive range of motion    PT Next Visit Plan  aggressive ROM, gait training with cane  PT Home Exercise Plan  Access  Code: [email or web address], then added standing marches, H.S. curls, hip abd, and mini squat    Consulted and Agree with Plan of Care  Patient       Patient will benefit from skilled therapeutic intervention in order to improve the following deficits and impairments:  Abnormal gait, Pain, Increased muscle spasms, Increased fascial restricitons, Decreased mobility, Decreased activity tolerance, Decreased range of motion, Decreased strength, Impaired flexibility, Difficulty walking, Decreased balance  Visit Diagnosis: Other abnormalities of gait and mobility  Muscle weakness  Localized edema  Stiffness of left knee, not elsewhere classified  Acute pain of left knee     Problem List Patient Active Problem List   Diagnosis Date Noted  . Quadriceps tendon rupture, left, initial encounter 01/10/2019  . Closed comminuted fracture of left patella, initial encounter 01/08/2019    Silvestre Mesi 04/08/2019, 3:41 PM  [facility] Physical Therapy [address] [place], [place], [address] Phone: [phone]   Fax:  [phone]  Name: Zachary [name] MRN: [id number] Date of Birth: 1945/05/20

## 2019-04-09 ENCOUNTER — Encounter: Payer: Self-pay | Admitting: Physical Therapy

## 2019-04-09 ENCOUNTER — Ambulatory Visit (INDEPENDENT_AMBULATORY_CARE_PROVIDER_SITE_OTHER): Payer: Medicare Other | Admitting: Physical Therapy

## 2019-04-09 ENCOUNTER — Encounter: Payer: Medicare Other | Admitting: Physical Therapy

## 2019-04-09 DIAGNOSIS — M25562 Pain in left knee: Secondary | ICD-10-CM | POA: Diagnosis not present

## 2019-04-09 DIAGNOSIS — M6281 Muscle weakness (generalized): Secondary | ICD-10-CM

## 2019-04-09 DIAGNOSIS — R2689 Other abnormalities of gait and mobility: Secondary | ICD-10-CM

## 2019-04-09 DIAGNOSIS — R600 Localized edema: Secondary | ICD-10-CM

## 2019-04-09 DIAGNOSIS — M25662 Stiffness of left knee, not elsewhere classified: Secondary | ICD-10-CM | POA: Diagnosis not present

## 2019-04-09 NOTE — Therapy (Signed)
[facility] [address] [place], [place], [address] Phone: [phone]   Fax:  [phone]  Physical Therapy Treatment  Patient Details  Name: Zachary [name] MRN: [id number] Date of Birth: 21-Oct-1945 Referring Provider (PT): [name], MD   Encounter Date: 04/09/2019  PT End of Session - 04/09/19 1526    Visit Number  6    Number of Visits  16    Date for PT Re-Evaluation  05/13/19    Authorization Type  MCR/BCBS    PT Start Time  1440    PT Stop Time  1532    PT Time Calculation (min)  52 min    Activity Tolerance  Patient tolerated treatment well    Behavior During Therapy  [facility] for tasks assessed/performed       Past Medical History:  Diagnosis Date  . Diabetes mellitus   . Hypercholesteremia   . Hypertension   . Patella fracture    left  . PVD (peripheral vascular disease) ([place])     Past Surgical History:  Procedure Laterality Date  . AMPUTATION    . 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 ORIF PATELLA Left 01/10/2019   Procedure: OPEN REDUCTION INTERNAL (ORIF) FIXATION LEFT PATELLA FRACTURE;  Surgeon: [name], MD;  Location: [place];  Service: Orthopedics;  Laterality: Left;    There were no vitals filed for this visit.  Subjective Assessment - 04/09/19 1441    Subjective  doing well today.  sore last night but pain resolved today.    Pertinent History  Lt knee patella ORIF    Limitations  Standing;Walking    Patient Stated Goals  "I want to get my leg straight."    Currently in Pain?  No/denies    Pain Onset  More than a month ago         [facility] PT Assessment - 04/09/19 1504      Assessment   Medical Diagnosis  Lt patella ORIF    Referring Provider (PT)  [name], MD    Onset Date/Surgical Date  01/10/19    Hand Dominance  Right    Next MD Visit   04/16/2019      AROM   Left Knee Flexion  92      PROM   Left Knee Flexion  100                   [facility] Adult PT Treatment/Exercise - 04/09/19 1441      Knee/Hip Exercises: Stretches   Passive Hamstring Stretch  Left;3 reps;30 seconds    Passive Hamstring Stretch Limitations  supine with strap; pressure at distal thigh    Quad Stretch  Left;3 reps;30 seconds    Quad Stretch Limitations  supine with leg off EOB using strap    Knee: Self-Stretch Limitations  standing lunge stretch for knee flexion ROM Lt knee 10 sec X 5 reps with foot on 6 inch step      Knee/Hip Exercises: Aerobic   Nustep  bilat LE's X 7 min L6      Knee/Hip Exercises: Machines for Strengthening   Total Gym Leg Press  LLE only 2x10 50#      Knee/Hip Exercises: Standing   Forward Step Up  Left;10 reps;Hand Hold: 1;Step Height: 6"      Knee/Hip Exercises:  Seated   Sit to Sand  10 reps;with UE support   RLE on 4" step     Knee/Hip Exercises: Supine   Quad Sets  Left;15 reps    Quad Sets Limitations  hold 5 sec      Vasopneumatic   Number Minutes Vasopneumatic   10 minutes    Vasopnuematic Location   Knee    Vasopneumatic Pressure  Medium    Vasopneumatic Temperature   34 deg      Manual Therapy   Manual therapy comments  Lt knee PROM, manual stretching for H.S. flexion and ext mobs, patella mobs               PT Short Term Goals - 03/18/19 1519      PT SHORT TERM GOAL #1   Title  independent with HEP    Status  New    Target Date  04/15/19      PT SHORT TERM GOAL #2   Title  improve Lt knee PROM 0-100 for improved function    Status  New    Target Date  04/29/19        PT Long Term Goals - 03/18/19 1520      PT LONG TERM GOAL #1   Title  independent with advanced HEP    Status  New    Target Date  05/13/19      PT LONG TERM GOAL #2   Title  improve Lt knee AROM 0-110 for improved function    Status  New    Target Date  05/13/19      PT LONG TERM GOAL #3   Title   LLE strength improved to at least 4/5 for improved strength and function    Status  New    Target Date  05/13/19      PT LONG TERM GOAL #4   Title  amb without AD independently without gait deviations for improved mobility    Status  New    Target Date  05/13/19      PT LONG TERM GOAL #5   Title  report pain < 3/10 with activity for improved function    Status  New    Target Date  05/13/19            Plan - 04/09/19 1526    Clinical Impression Statement  Pt with improving ROM today and overall progressing well with PT.  Difficulty present with functional strengthening activities.  Will continue to benefit from PT to maximize function.    Personal Factors and Comorbidities  Time since onset of injury/illness/exacerbation;Comorbidity 3+    Comorbidities  PVD, HTN, DM    Examination-Activity Limitations  Stand;Locomotion Level;Bend;Stairs;Squat;Sleep    Examination-Participation Restrictions  Yard Work;Driving    Stability/Clinical Decision Making  Evolving/Moderate complexity    Rehab Potential  Good    PT Frequency  2x / week    PT Duration  8 weeks    PT Treatment/Interventions  ADLs/Self Care Home Management;Cryotherapy;Electrical Stimulation;Ultrasound;Moist Heat;Iontophoresis 4mg /ml Dexamethasone;Gait training;Stair training;Functional mobility training;Therapeutic activities;Therapeutic exercise;Balance training;Patient/family education;Neuromuscular re-education;Manual techniques;Scar mobilization;Vasopneumatic Device;Taping;Dry needling;Passive range of motion    PT Next Visit Plan  aggressive ROM, gait training with cane; check HEP for STG#1    PT Home Exercise Plan  Access Code: [email or web address], then added standing marches, H.S. curls, hip abd, and mini squat    Consulted and Agree with Plan of Care  Patient       Patient will benefit from skilled therapeutic  intervention in order to improve the following deficits and impairments:  Abnormal gait, Pain, Increased muscle spasms,  Increased fascial restricitons, Decreased mobility, Decreased activity tolerance, Decreased range of motion, Decreased strength, Impaired flexibility, Difficulty walking, Decreased balance  Visit Diagnosis: Other abnormalities of gait and mobility  Localized edema  Stiffness of left knee, not elsewhere classified  Acute pain of left knee  Muscle weakness (generalized)     Problem List Patient Active Problem List   Diagnosis Date Noted  . Quadriceps tendon rupture, left, initial encounter 01/10/2019  . Closed comminuted fracture of left patella, initial encounter 01/08/2019       Laureen Abrahams, PT, DPT 04/09/19 3:28 PM     [facility] [address] [place], [place], [address] Phone: [phone]   Fax:  [phone]  Name: Zachary [name] MRN: [id number] Date of Birth: 1946-03-14

## 2019-04-10 ENCOUNTER — Encounter: Payer: Medicare Other | Admitting: Physical Therapy

## 2019-04-15 ENCOUNTER — Encounter: Payer: Self-pay | Admitting: Physical Therapy

## 2019-04-15 ENCOUNTER — Other Ambulatory Visit: Payer: Self-pay

## 2019-04-15 ENCOUNTER — Ambulatory Visit (INDEPENDENT_AMBULATORY_CARE_PROVIDER_SITE_OTHER): Payer: Medicare Other | Admitting: Physical Therapy

## 2019-04-15 DIAGNOSIS — M6281 Muscle weakness (generalized): Secondary | ICD-10-CM | POA: Diagnosis not present

## 2019-04-15 DIAGNOSIS — M25562 Pain in left knee: Secondary | ICD-10-CM | POA: Diagnosis not present

## 2019-04-15 DIAGNOSIS — R600 Localized edema: Secondary | ICD-10-CM | POA: Diagnosis not present

## 2019-04-15 DIAGNOSIS — R2689 Other abnormalities of gait and mobility: Secondary | ICD-10-CM | POA: Diagnosis not present

## 2019-04-15 DIAGNOSIS — M25662 Stiffness of left knee, not elsewhere classified: Secondary | ICD-10-CM

## 2019-04-15 NOTE — Therapy (Signed)
[facility] [address] [place], [place], [address] Phone: [phone]   Fax:  [phone]  Physical Therapy Treatment  Patient Details  Name: Zachary [name] MRN: [id number] Date of Birth: [birth year] Referring Provider (PT): [name], MD   Encounter Date: 04/15/2019  PT End of Session - 04/15/19 1433    Visit Number  7    Number of Visits  16    Date for PT Re-Evaluation  05/13/19    Authorization Type  MCR/BCBS    PT Start Time  1350    PT Stop Time  1430    PT Time Calculation (min)  40 min    Activity Tolerance  Patient tolerated treatment well    Behavior During Therapy  [facility] for tasks assessed/performed       Past Medical History:  Diagnosis Date  . Diabetes mellitus   . Hypercholesteremia   . Hypertension   . Patella fracture    left  . PVD (peripheral vascular disease) ([place])     Past Surgical History:  Procedure Laterality Date  . AMPUTATION    . 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 ORIF PATELLA Left 01/10/2019   Procedure: OPEN REDUCTION INTERNAL (ORIF) FIXATION LEFT PATELLA FRACTURE;  Surgeon: [name], MD;  Location: [place];  Service: Orthopedics;  Laterality: Left;    There were no vitals filed for this visit.  Subjective Assessment - 04/15/19 1352    Subjective  has some pain with increased walking but overall doing well, feels motion continues to improve    Pertinent History  Lt knee patella ORIF    Limitations  Standing;Walking    Patient Stated Goals  "I want to get my leg straight."    Currently in Pain?  No/denies    Pain Onset  More than a month ago         [facility] PT Assessment - 04/15/19 1412      Assessment   Medical Diagnosis  Lt patella ORIF    Referring Provider (PT)  [name], MD    Onset Date/Surgical Date  01/10/19    Hand  Dominance  Right    Next MD Visit  04/16/2019      AROM   Left Knee Extension  -3    Left Knee Flexion  101      PROM   Left Knee Extension  -1    Left Knee Flexion  104                   [facility] Adult PT Treatment/Exercise - 04/15/19 1353      Knee/Hip Exercises: Stretches   Passive Hamstring Stretch  Left;3 reps;30 seconds    Passive Hamstring Stretch Limitations  supine with strap; pressure at distal thigh    Quad Stretch  Left;3 reps;30 seconds    Quad Stretch Limitations  supine with leg off EOB using strap    Knee: Self-Stretch Limitations  standing lunge stretch for knee flexion ROM Lt knee 10 sec X 10 reps with foot on 6 inch step      Knee/Hip Exercises: Aerobic   Nustep  L7 x 8 min      Knee/Hip Exercises: Machines for Strengthening   Total Gym Leg Press  LLE only 2x10 62#    Other Machine  bridge with LLE  push 50# 2x10      Knee/Hip Exercises: Standing   Forward Step Up  Left;10 reps;Hand Hold: 1;Step Height: 6"    Other Standing Knee Exercises  weight shift to Lt 5x10 sec      Knee/Hip Exercises: Supine   Quad Sets  Left;15 reps    Quad Sets Limitations  hold 5 sec    Heel Slides  AAROM;Left;10 reps    Heel Prop for Knee Extension  3 minutes;Weight    Heel Prop for Knee Extension Weight (lbs)  5               PT Short Term Goals - 04/15/19 1433      PT SHORT TERM GOAL #1   Title  independent with HEP    Status  Achieved    Target Date  04/15/19      PT SHORT TERM GOAL #2   Title  improve Lt knee PROM 0-100 for improved function    Baseline  1/4: -2-104    Status  Partially Met    Target Date  04/29/19        PT Long Term Goals - 03/18/19 1520      PT LONG TERM GOAL #1   Title  independent with advanced HEP    Status  New    Target Date  05/13/19      PT LONG TERM GOAL #2   Title  improve Lt knee AROM 0-110 for improved function    Status  New    Target Date  05/13/19      PT LONG TERM GOAL #3   Title  LLE strength  improved to at least 4/5 for improved strength and function    Status  New    Target Date  05/13/19      PT LONG TERM GOAL #4   Title  amb without AD independently without gait deviations for improved mobility    Status  New    Target Date  05/13/19      PT LONG TERM GOAL #5   Title  report pain < 3/10 with activity for improved function    Status  New    Target Date  05/13/19            Plan - 04/15/19 1434    Clinical Impression Statement  Pt continues to demonstrate improvement in Lt knee ROM and strength.  Amb with SPC today instead of walker, and pt modifed independent with SPC.  will cotninue to benefit from PT to maximize function.    Personal Factors and Comorbidities  Time since onset of injury/illness/exacerbation;Comorbidity 3+    Comorbidities  PVD, HTN, DM    Examination-Activity Limitations  Stand;Locomotion Level;Bend;Stairs;Squat;Sleep    Examination-Participation Restrictions  Yard Work;Driving    Stability/Clinical Decision Making  Evolving/Moderate complexity    Rehab Potential  Good    PT Frequency  2x / week    PT Duration  8 weeks    PT Treatment/Interventions  ADLs/Self Care Home Management;Cryotherapy;Electrical Stimulation;Ultrasound;Moist Heat;Iontophoresis 51m/ml Dexamethasone;Gait training;Stair training;Functional mobility training;Therapeutic activities;Therapeutic exercise;Balance training;Patient/family education;Neuromuscular re-education;Manual techniques;Scar mobilization;Vasopneumatic Device;Taping;Dry needling;Passive range of motion    PT Next Visit Plan  aggressive ROM, gait training with cane    PT Home Exercise Plan  Access Code: [email or web address], then added standing marches, H.S. curls, hip abd, and mini squat    Consulted and Agree with Plan of Care  Patient       Patient will benefit from skilled therapeutic intervention in  order to improve the following deficits and impairments:  Abnormal gait, Pain, Increased muscle spasms, Increased fascial  restricitons, Decreased mobility, Decreased activity tolerance, Decreased range of motion, Decreased strength, Impaired flexibility, Difficulty walking, Decreased balance  Visit Diagnosis: Other abnormalities of gait and mobility  Localized edema  Stiffness of left knee, not elsewhere classified  Acute pain of left knee  Muscle weakness (generalized)     Problem List Patient Active Problem List   Diagnosis Date Noted  . Quadriceps tendon rupture, left, initial encounter 01/10/2019  . Closed comminuted fracture of left patella, initial encounter 01/08/2019      Laureen Abrahams, PT, DPT 04/15/19 2:35 PM     [facility] [address] [place], [place], [address] Phone: [phone]   Fax:  [phone]  Name: Zachary [name] MRN: [id number] Date of Birth: 13-Sep-1945

## 2019-04-16 ENCOUNTER — Ambulatory Visit (INDEPENDENT_AMBULATORY_CARE_PROVIDER_SITE_OTHER): Payer: Medicare Other | Admitting: Orthopaedic Surgery

## 2019-04-16 ENCOUNTER — Encounter: Payer: Self-pay | Admitting: Orthopaedic Surgery

## 2019-04-16 ENCOUNTER — Ambulatory Visit (INDEPENDENT_AMBULATORY_CARE_PROVIDER_SITE_OTHER): Payer: Medicare Other

## 2019-04-16 VITALS — Ht 68.0 in | Wt 200.0 lb

## 2019-04-16 DIAGNOSIS — S82042A Displaced comminuted fracture of left patella, initial encounter for closed fracture: Secondary | ICD-10-CM | POA: Diagnosis not present

## 2019-04-16 DIAGNOSIS — M25572 Pain in left ankle and joints of left foot: Secondary | ICD-10-CM

## 2019-04-16 NOTE — Progress Notes (Signed)
Office Visit Note   Patient: Zachary [name]           Date of Birth: December 05, 1945           MRN: [id number] Visit Date: 04/16/2019              Requested by: [name], [place] [address] [place],  [place] [address] PCP: [name], MD   Assessment & Plan: Visit Diagnoses:  1. Closed comminuted fracture of left patella, initial encounter   2. Pain in left ankle and joints of left foot     Plan: Impression is 63-month status post ORIF left patella fracture with healing.  Continue with physical therapy for strengthening.  In terms of the ankle we feel he would benefit from physical therapy as well.  We will place him in an ASO brace to see if this will give some more support.  Questions encouraged and answered.  Follow-up as needed.  Follow-Up Instructions: Return if symptoms worsen or fail to improve.   Orders:  Orders Placed This Encounter  Procedures  . XR Knee 1-2 Views Left  . XR Ankle Complete Left   No orders of the defined types were placed in this encounter.     Procedures: No procedures performed   Clinical Data: No additional findings.   Subjective: Chief Complaint  Patient presents with  . Left Knee - Follow-up    Left ORIF patella fracture DOS 01/10/2019    [name] is 3 months status post ORIF left patella fracture.  He is going to physical therapy twice a week.  Overall he is doing well.  He takes Tylenol as needed.  His knee does not bother him significantly.  He does endorse some chronic left lateral ankle pain since the fall.  He states he has had multiple ankle inversion injuries in the past.  It causes some mild discomfort with ambulation weightbearing.  Denies any numbness and tingling related to this.  He wears custom orthotics for his diabetic neuropathy.   Review of Systems   Objective: Vital Signs: Ht 5\' 8"  (1.727 m)   Wt 200 lb (90.7 kg)   BMI 30.41 kg/m   Physical Exam  Ortho Exam Left knee exam shows a fully healed  surgical scar.  Range of motion of the knee does not produce any pain.  He has good extension strength.  No tenderness palpation.  Left ankle exam shows no swelling.  He has some mild discomfort with palpation along the lateral ankle region and over the ATFL.  Negative anterior drawer. Specialty Comments:  No specialty comments available.  Imaging: XR Ankle Complete Left  Result Date: 04/16/2019 No acute abnormalities.  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  XR Knee 1-2 Views Left  Result Date: 04/16/2019 Healed patella fracture with intact hardware.      PMFS History: Patient Active Problem List   Diagnosis Date Noted  . Quadriceps tendon rupture, left, initial encounter 01/10/2019  . Closed comminuted fracture of left patella, initial encounter 01/08/2019   Past Medical History:  Diagnosis Date  . Diabetes mellitus   . Hypercholesteremia   . Hypertension   . Patella fracture    left  . PVD (peripheral vascular disease) ([place])     History reviewed. No pertinent family history.  Past Surgical History:  Procedure Laterality Date  . AMPUTATION    .  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 ORIF PATELLA Left 01/10/2019   Procedure: OPEN REDUCTION INTERNAL (ORIF) FIXATION LEFT PATELLA FRACTURE;  Surgeon: [name], MD;  Location: [place];  Service: Orthopedics;  Laterality: Left;   Social History   Occupational History  . Not on file  Tobacco Use  . Smoking status: Never Smoker  . Smokeless tobacco: Never Used  Substance and Sexual Activity  . Alcohol use: No  . Drug use: No  . Sexual activity: Not on file

## 2019-04-17 ENCOUNTER — Encounter: Payer: Medicare Other | Admitting: Physical Therapy

## 2019-04-29 ENCOUNTER — Encounter: Payer: Self-pay | Admitting: Physical Therapy

## 2019-04-29 ENCOUNTER — Other Ambulatory Visit: Payer: Self-pay

## 2019-04-29 ENCOUNTER — Ambulatory Visit (INDEPENDENT_AMBULATORY_CARE_PROVIDER_SITE_OTHER): Payer: Medicare Other | Admitting: Physical Therapy

## 2019-04-29 DIAGNOSIS — M25562 Pain in left knee: Secondary | ICD-10-CM

## 2019-04-29 DIAGNOSIS — R2689 Other abnormalities of gait and mobility: Secondary | ICD-10-CM | POA: Diagnosis not present

## 2019-04-29 DIAGNOSIS — M6281 Muscle weakness (generalized): Secondary | ICD-10-CM | POA: Diagnosis not present

## 2019-04-29 DIAGNOSIS — M25662 Stiffness of left knee, not elsewhere classified: Secondary | ICD-10-CM

## 2019-04-29 DIAGNOSIS — R600 Localized edema: Secondary | ICD-10-CM

## 2019-04-29 NOTE — Therapy (Signed)
[facility] [address] [place], [place], [address] Phone: [phone]   Fax:  [phone]  Physical Therapy Treatment  Patient Details  Name: Zachary [name] MRN: [id number] Date of Birth: Nov 11, 1945 Referring Provider (PT): [name], MD   Encounter Date: 04/29/2019  PT End of Session - 04/29/19 1609    Visit Number  8    Number of Visits  16    Date for PT Re-Evaluation  05/13/19    Authorization Type  MCR/BCBS    PT Start Time  1528    PT Stop Time  1607    PT Time Calculation (min)  39 min    Activity Tolerance  Patient tolerated treatment well    Behavior During Therapy  [facility] for tasks assessed/performed       Past Medical History:  Diagnosis Date  . Diabetes mellitus   . Hypercholesteremia   . Hypertension   . Patella fracture    left  . PVD (peripheral vascular disease) ([place])     Past Surgical History:  Procedure Laterality Date  . AMPUTATION    . 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 ORIF PATELLA Left 01/10/2019   Procedure: OPEN REDUCTION INTERNAL (ORIF) FIXATION LEFT PATELLA FRACTURE;  Surgeon: [name], MD;  Location: [place];  Service: Orthopedics;  Laterality: Left;    There were no vitals filed for this visit.  Subjective Assessment - 04/29/19 1533    Subjective  doing well; knee feels like it's doing better.    Pertinent History  Lt knee patella ORIF    Limitations  Standing;Walking    Patient Stated Goals  "I want to get my leg straight."    Currently in Pain?  No/denies    Pain Onset  More than a month ago         [facility] PT Assessment - 04/29/19 1548      Assessment   Medical Diagnosis  Lt patella ORIF    Referring Provider (PT)  [name], MD    Onset Date/Surgical Date  01/10/19    Hand Dominance  Right    Next MD Visit  PRN      AROM   Left Knee Extension  0   after supine heel prop with quad sets   Left Knee Flexion  105      PROM   Left Knee Flexion  108                   [facility] Adult PT Treatment/Exercise - 04/29/19 1533      Knee/Hip Exercises: Aerobic   Nustep  L7 x 8 min      Knee/Hip Exercises: Machines for Strengthening   Total Gym Leg Press  LLE only 2x10 75#      Knee/Hip Exercises: Standing   Functional Squat  2 sets;10 reps    Functional Squat Limitations  15# KB; sit to shuttle seat    SLS  LLE 5x15 sec; intermittent UE support    Other Standing Knee Exercises  deadlifts to 6" step 2x10; 15# KB      Knee/Hip Exercises: Supine   Quad Sets  Left;20 reps    Quad Sets Limitations  5 sec hold; with heel prop    Heel Slides  AAROM;Left;15 reps    Straight Leg Raises  Left;15  reps    Straight Leg Raises Limitations  min cues to decrease quad lag      Manual Therapy   Manual Therapy  Passive ROM    Passive ROM  Lt knee flexion/extension               PT Short Term Goals - 04/15/19 1433      PT SHORT TERM GOAL #1   Title  independent with HEP    Status  Achieved    Target Date  04/15/19      PT SHORT TERM GOAL #2   Title  improve Lt knee PROM 0-100 for improved function    Baseline  1/4: -2-104    Status  Partially Met    Target Date  04/29/19        PT Long Term Goals - 03/18/19 1520      PT LONG TERM GOAL #1   Title  independent with advanced HEP    Status  New    Target Date  05/13/19      PT LONG TERM GOAL #2   Title  improve Lt knee AROM 0-110 for improved function    Status  New    Target Date  05/13/19      PT LONG TERM GOAL #3   Title  LLE strength improved to at least 4/5 for improved strength and function    Status  New    Target Date  05/13/19      PT LONG TERM GOAL #4   Title  amb without AD independently without gait deviations for improved mobility    Status  New    Target Date  05/13/19      PT LONG TERM GOAL #5   Title  report  pain < 3/10 with activity for improved function    Status  New    Target Date  05/13/19            Plan - 04/29/19 1610    Clinical Impression Statement  Pt overall doing well and feels he is back to near baseline.  Still with some functional weakness and decreased balance on LLE with mild ROM limitations.  Anticipate he will be ready to transition to strengthening HEP and d/c from formal PT next 1-2 weeks.    Personal Factors and Comorbidities  Time since onset of injury/illness/exacerbation;Comorbidity 3+    Comorbidities  PVD, HTN, DM    Examination-Activity Limitations  Stand;Locomotion Level;Bend;Stairs;Squat;Sleep    Examination-Participation Restrictions  Yard Work;Driving    Stability/Clinical Decision Making  Evolving/Moderate complexity    Rehab Potential  Good    PT Frequency  2x / week    PT Duration  8 weeks    PT Treatment/Interventions  ADLs/Self Care Home Management;Cryotherapy;Electrical Stimulation;Ultrasound;Moist Heat;Iontophoresis '4mg'$ /ml Dexamethasone;Gait training;Stair training;Functional mobility training;Therapeutic activities;Therapeutic exercise;Balance training;Patient/family education;Neuromuscular re-education;Manual techniques;Scar mobilization;Vasopneumatic Device;Taping;Dry needling;Passive range of motion    PT Next Visit Plan  work on strengthening, establish program for HEP; prepare for d/c if pt ready    PT Home Exercise Plan  Access Code: [email or web address], then added standing marches, H.S. curls, hip abd, and mini squat    Consulted and Agree with Plan of Care  Patient       Patient will benefit from skilled therapeutic intervention in order to improve the following deficits and impairments:  Abnormal gait, Pain, Increased muscle spasms, Increased fascial restricitons, Decreased mobility, Decreased activity tolerance, Decreased range of motion, Decreased strength, Impaired flexibility, Difficulty walking, Decreased balance  Visit  Diagnosis: Other  abnormalities of gait and mobility  Localized edema  Stiffness of left knee, not elsewhere classified  Acute pain of left knee  Muscle weakness (generalized)     Problem List Patient Active Problem List   Diagnosis Date Noted  . Quadriceps tendon rupture, left, initial encounter 01/10/2019  . Closed comminuted fracture of left patella, initial encounter 01/08/2019      Laureen Abrahams, PT, DPT 04/29/19 4:13 PM     [facility] [address] [place], [place], [address] Phone: [phone]   Fax:  [phone]  Name: DARUIS SWAIM MRN: [id number] Date of Birth: 09-Jan-1946

## 2019-05-01 ENCOUNTER — Other Ambulatory Visit: Payer: Self-pay

## 2019-05-01 ENCOUNTER — Ambulatory Visit (INDEPENDENT_AMBULATORY_CARE_PROVIDER_SITE_OTHER): Payer: Medicare Other | Admitting: Physical Therapy

## 2019-05-01 DIAGNOSIS — R600 Localized edema: Secondary | ICD-10-CM | POA: Diagnosis not present

## 2019-05-01 DIAGNOSIS — R2689 Other abnormalities of gait and mobility: Secondary | ICD-10-CM | POA: Diagnosis not present

## 2019-05-01 DIAGNOSIS — M25662 Stiffness of left knee, not elsewhere classified: Secondary | ICD-10-CM

## 2019-05-01 DIAGNOSIS — M6281 Muscle weakness (generalized): Secondary | ICD-10-CM

## 2019-05-01 DIAGNOSIS — M25562 Pain in left knee: Secondary | ICD-10-CM

## 2019-05-01 NOTE — Therapy (Signed)
[facility] Physical Therapy [address] [place], [place], [address] Phone: [phone]   Fax:  [phone]  Physical Therapy Treatment  Patient Details  Name: Zachary [name] MRN: [id number] Date of Birth: [birth year] Referring Provider (PT): [name], MD   Encounter Date: 05/01/2019  PT End of Session - 05/01/19 1601    Visit Number  9    Number of Visits  16    Date for PT Re-Evaluation  05/13/19    Authorization Type  MCR/BCBS    PT Start Time  1530    PT Stop Time  1610    PT Time Calculation (min)  40 min    Activity Tolerance  Patient tolerated treatment well    Behavior During Therapy  [facility] for tasks assessed/performed       Past Medical History:  Diagnosis Date  . Diabetes mellitus   . Hypercholesteremia   . Hypertension   . Patella fracture    left  . PVD (peripheral vascular disease) ([place])     Past Surgical History:  Procedure Laterality Date  . AMPUTATION    . 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 ORIF PATELLA Left 01/10/2019   Procedure: OPEN REDUCTION INTERNAL (ORIF) FIXATION LEFT PATELLA FRACTURE;  Surgeon: [name], MD;  Location: [place];  Service: Orthopedics;  Laterality: Left;    There were no vitals filed for this visit.  Subjective Assessment - 05/01/19 1610    Subjective  My knee is doing better, no pain upon arrival but soreness. I feel I need another week of PT    Pertinent History  Lt knee patella ORIF    Limitations  Standing;Walking    Patient Stated Goals  "I want to get my leg straight."    Currently in Pain?  No/denies    Pain Onset  More than a month ago                       [facility] Adult PT Treatment/Exercise - 05/01/19 0001      Knee/Hip Exercises: Stretches   Active Hamstring Stretch  Left;2 reps;30 seconds    Active Hamstring  Stretch Limitations  seated    Knee: Self-Stretch Limitations  standing lunge stretch for knee flexion ROM Lt knee 10 sec X 10 reps with foot on 6 inch step    Gastroc Stretch Limitations  30 sec X 2 on slantboard    Other Knee/Hip Stretches  heel prop 5 min with 5 min weight for Lt knee      Knee/Hip Exercises: Aerobic   Recumbent Bike  5 min L2      Knee/Hip Exercises: Standing   Functional Squat  2 sets;10 reps    Functional Squat Limitations  at sink    Other Standing Knee Exercises  mini lunges X 10 bilat with one UE support and cues/demo for proper technique    Other Standing Knee Exercises  up/down standard stairs at hall one flight with one handrail and reciprocal pattern      Manual Therapy   Passive ROM  Lt knee flexion/extension             PT Education - 05/01/19 1553    Education Details  HEP progression, POC that PT anticipates of one more week or PT    Person(s) Educated  Patient    Methods  Explanation;Demonstration    Comprehension  Verbalized understanding;Returned demonstration       PT Short Term Goals - 04/15/19 1433      PT SHORT TERM GOAL #1   Title  independent with HEP    Status  Achieved    Target Date  04/15/19      PT SHORT TERM GOAL #2   Title  improve Lt knee PROM 0-100 for improved function    Baseline  1/4: -2-104    Status  Partially Met    Target Date  04/29/19        PT Long Term Goals - 03/18/19 1520      PT LONG TERM GOAL #1   Title  independent with advanced HEP    Status  New    Target Date  05/13/19      PT LONG TERM GOAL #2   Title  improve Lt knee AROM 0-110 for improved function    Status  New    Target Date  05/13/19      PT LONG TERM GOAL #3   Title  LLE strength improved to at least 4/5 for improved strength and function    Status  New    Target Date  05/13/19      PT LONG TERM GOAL #4   Title  amb without AD independently without gait deviations for improved mobility    Status  New    Target Date   05/13/19      PT LONG TERM GOAL #5   Title  report pain < 3/10 with activity for improved function    Status  New    Target Date  05/13/19            Plan - 05/01/19 1601    Clinical Impression Statement  He is doing well but still has mild limitations with quad strength and knee stiffness. His HEP was progressed today and condensed to most important stretches and strengthening exercises. He was able to negotiate one flight of stairs today with reciprocal pattern and without complaints. PT anticipates he will need one more week of PT then discharge to his HEP progression.    Personal Factors and Comorbidities  Time since onset of injury/illness/exacerbation;Comorbidity 3+    Comorbidities  PVD, HTN, DM    Examination-Activity Limitations  Stand;Locomotion Level;Bend;Stairs;Squat;Sleep    Examination-Participation Restrictions  Yard Work;Driving    Stability/Clinical Decision Making  Evolving/Moderate complexity    Rehab Potential  Good    PT Frequency  2x / week    PT Duration  8 weeks    PT Treatment/Interventions  ADLs/Self Care Home Management;Cryotherapy;Electrical Stimulation;Ultrasound;Moist Heat;Iontophoresis 58m/ml Dexamethasone;Gait training;Stair training;Functional mobility training;Therapeutic activities;Therapeutic exercise;Balance training;Patient/family education;Neuromuscular re-education;Manual techniques;Scar mobilization;Vasopneumatic Device;Taping;Dry needling;Passive range of motion    PT Next Visit Plan  work on strengthening, establish program for HEP; prepare for d/c if pt ready    PT Home Exercise Plan  Access Code: [email or web address], then added standing marches, H.S. curls, hip abd, and mini squat    Consulted and Agree with Plan of Care  Patient       Patient will benefit from skilled therapeutic intervention in order to improve the following deficits and impairments:  Abnormal gait, Pain, Increased muscle spasms, Increased fascial restricitons, Decreased mobility,  Decreased activity tolerance, Decreased range of motion, Decreased strength, Impaired flexibility, Difficulty walking, Decreased balance  Visit Diagnosis: Other abnormalities of gait and mobility  Localized edema  Stiffness of left knee,  not elsewhere classified  Acute pain of left knee  Muscle weakness (generalized)     Problem List Patient Active Problem List   Diagnosis Date Noted  . Quadriceps tendon rupture, left, initial encounter 01/10/2019  . Closed comminuted fracture of left patella, initial encounter 01/08/2019    Silvestre Mesi 05/01/2019, 4:15 PM  [facility] Physical Therapy [address] [place], [place], [address] Phone: [phone]   Fax:  [phone]  Name: Zachary [name] MRN: [id number] Date of Birth: [birth year]

## 2019-05-01 NOTE — Patient Instructions (Signed)
Access Code: [email or web address]  URL: [email or web address]  Date: 05/01/2019  Prepared by: Elsie Ra   Exercises  Standing Knee Flexion Stretch on Step - 10 reps - 1 sets - 10 hold - 2x daily - 6x weekly  Seated Hamstring Stretch - 2 reps - 1 sets - 30 hold - 2x daily - 6x weekly  Sit to Stand without Arm Support - 10 reps - 1-2 sets - 2x daily - 6x weekly  Step Up - 10 reps - 1-2 sets - 2x daily - 6x weekly  Forward Step Down - 10 reps - 2-3 sets - 2x daily - 6x weekly  Standing Partial Lunge - 10 reps - 3 sets - 2x daily - 6x weekly  Mini Squat with Counter Support - 10 reps - 1-2 sets - 2x daily - 6x weekly  Supine Knee Extension Mobilization with Weight - 10 reps - 3 sets - 2x daily - 6x weekly

## 2019-05-06 ENCOUNTER — Other Ambulatory Visit: Payer: Self-pay

## 2019-05-06 ENCOUNTER — Ambulatory Visit (INDEPENDENT_AMBULATORY_CARE_PROVIDER_SITE_OTHER): Payer: Medicare Other | Admitting: Physical Therapy

## 2019-05-06 DIAGNOSIS — R600 Localized edema: Secondary | ICD-10-CM | POA: Diagnosis not present

## 2019-05-06 DIAGNOSIS — M25562 Pain in left knee: Secondary | ICD-10-CM

## 2019-05-06 DIAGNOSIS — R2689 Other abnormalities of gait and mobility: Secondary | ICD-10-CM

## 2019-05-06 DIAGNOSIS — M25662 Stiffness of left knee, not elsewhere classified: Secondary | ICD-10-CM | POA: Diagnosis not present

## 2019-05-06 DIAGNOSIS — M6281 Muscle weakness (generalized): Secondary | ICD-10-CM

## 2019-05-06 NOTE — Therapy (Signed)
[facility] Physical Therapy [address] [place], [place], [address] Phone: [phone]   Fax:  [phone]  Physical Therapy Treatment/MCR progress note Progress Note reporting period 03/18/19 to 05/05/18  See below for objective and subjective measurements relating to patients progress with PT.   Patient Details  Name: Zachary [name] MRN: [id number] Date of Birth: 01-Nov-1945 Referring Provider (PT): [name], MD   Encounter Date: 05/06/2019  PT End of Session - 05/06/19 2050    Visit Number  10    Number of Visits  16    Date for PT Re-Evaluation  05/13/19    Authorization Type  MCR/BCBS    PT Start Time  1530    PT Stop Time  1610    PT Time Calculation (min)  40 min    Activity Tolerance  Patient tolerated treatment well    Behavior During Therapy  [facility] for tasks assessed/performed       Past Medical History:  Diagnosis Date  . Diabetes mellitus   . Hypercholesteremia   . Hypertension   . Patella fracture    left  . PVD (peripheral vascular disease) ([place])     Past Surgical History:  Procedure Laterality Date  . AMPUTATION    . 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 ORIF PATELLA Left 01/10/2019   Procedure: OPEN REDUCTION INTERNAL (ORIF) FIXATION LEFT PATELLA FRACTURE;  Surgeon: [name], MD;  Location: [place];  Service: Orthopedics;  Laterality: Left;    There were no vitals filed for this visit.  Subjective Assessment - 05/06/19 2047    Subjective  My knee is doing well, some minor pain at my shin and stiffness but it gets better each week    Pertinent History  Lt knee patella ORIF    Limitations  Standing;Walking    Patient Stated Goals  "I want to get my leg straight."    Pain Onset  More than a month ago         [facility] PT Assessment - 05/06/19 0001      Assessment   Medical Diagnosis  Lt patella ORIF    Referring Provider (PT)  [name], MD    Onset Date/Surgical Date  01/10/19      AROM   Left Knee Extension  0    Left Knee Flexion  110   AAROM with strap                  [facility] Adult PT Treatment/Exercise - 05/06/19 0001      Knee/Hip Exercises: Stretches   Active Hamstring Stretch  Left;2 reps;30 seconds    Active Hamstring Stretch Limitations  seated    Knee: Self-Stretch Limitations  standing lunge stretch for knee flexion ROM Lt knee 10 sec X 10 reps with foot on 6 inch step    Other Knee/Hip Stretches  --    Other Knee/Hip Stretches  heelslides AAROM supine 10 sec X 10  reps      Knee/Hip Exercises: Aerobic   Recumbent Bike  7 min L2      Knee/Hip Exercises: Machines for Strengthening   Total Gym Leg Press  LLE only 2x15 75#      Knee/Hip Exercises: Standing   Lateral Step Up Limitations  lateral step up and off the other side X 10 reps bilat  on 8 inch step with bilat UE support    Forward Step Up  Left;15 reps;Hand Hold: 2;Step Height: 8"    Functional Squat  2 sets;10 reps    Functional Squat Limitations  at sink going down to touch chair then back up    Other Standing Knee Exercises  mini lunges X 10 bilat with one UE support and cues/demo for proper technique    Other Standing Knee Exercises  SLS 10 reps each side, holding 3 sec avg               PT Short Term Goals - 04/15/19 1433      PT SHORT TERM GOAL #1   Title  independent with HEP    Status  Achieved    Target Date  04/15/19      PT SHORT TERM GOAL #2   Title  improve Lt knee PROM 0-100 for improved function    Baseline  1/4: -2-104    Status  Partially Met    Target Date  04/29/19        PT Long Term Goals - 05/06/19 2050      PT LONG TERM GOAL #1   Title  independent with advanced HEP    Status  Achieved      PT LONG TERM GOAL #2   Title  improve Lt knee AROM 0-110 for improved function    Status  Achieved      PT LONG TERM  GOAL #3   Title  LLE strength improved to at least 4/5 for improved strength and function    Status  Achieved      PT LONG TERM GOAL #4   Title  amb without AD independently without gait deviations for improved mobility    Status  Partially Met      PT LONG TERM GOAL #5   Title  report pain < 3/10 with activity for improved function    Status  Achieved            Plan - 05/06/19 2051    Clinical Impression Statement  He has met 4/5 PT goals and has made excellent progress with PT. Only now has mild knee stiffness and weakness. He may be ready for discharge next visit so PT will have this discussion with him but may continue for another week or 2 to improve gait and overall strength,ROM, function.    Personal Factors and Comorbidities  Time since onset of injury/illness/exacerbation;Comorbidity 3+    Comorbidities  PVD, HTN, DM    Examination-Activity Limitations  Stand;Locomotion Level;Bend;Stairs;Squat;Sleep    Examination-Participation Restrictions  Yard Work;Driving    Stability/Clinical Decision Making  Evolving/Moderate complexity    Rehab Potential  Good    PT Frequency  2x / week    PT Duration  8 weeks    PT Treatment/Interventions  ADLs/Self Care Home Management;Cryotherapy;Electrical Stimulation;Ultrasound;Moist Heat;Iontophoresis 77m/ml Dexamethasone;Gait training;Stair training;Functional mobility training;Therapeutic activities;Therapeutic exercise;Balance training;Patient/family education;Neuromuscular re-education;Manual techniques;Scar mobilization;Vasopneumatic Device;Taping;Dry needling;Passive range of motion    PT Next Visit Plan  work on strengthening, establish program for HEP; prepare for d/c if pt ready    PT Home Exercise Plan  Access Code: [email or web address], then added standing marches, H.S. curls, hip abd, and mini squat    Consulted and Agree with Plan of Care  Patient       Patient will benefit from skilled therapeutic intervention in order to improve the  following deficits and impairments:  Abnormal gait, Pain, Increased muscle spasms, Increased  fascial restricitons, Decreased mobility, Decreased activity tolerance, Decreased range of motion, Decreased strength, Impaired flexibility, Difficulty walking, Decreased balance  Visit Diagnosis: Other abnormalities of gait and mobility  Localized edema  Stiffness of left knee, not elsewhere classified  Acute pain of left knee  Muscle weakness (generalized)     Problem List Patient Active Problem List   Diagnosis Date Noted  . Quadriceps tendon rupture, left, initial encounter 01/10/2019  . Closed comminuted fracture of left patella, initial encounter 01/08/2019    Debbe Odea 05/06/2019, 8:53 PM  [facility] Physical Therapy [address] [place], [place], [address] Phone: [phone]   Fax:  [phone]  Name: Zachary [name] MRN: [id number] Date of Birth: Nov 05, 1945

## 2019-05-08 ENCOUNTER — Ambulatory Visit (INDEPENDENT_AMBULATORY_CARE_PROVIDER_SITE_OTHER): Payer: Medicare Other | Admitting: Physical Therapy

## 2019-05-08 ENCOUNTER — Other Ambulatory Visit: Payer: Self-pay

## 2019-05-08 DIAGNOSIS — M6281 Muscle weakness (generalized): Secondary | ICD-10-CM

## 2019-05-08 DIAGNOSIS — R600 Localized edema: Secondary | ICD-10-CM | POA: Diagnosis not present

## 2019-05-08 DIAGNOSIS — M25662 Stiffness of left knee, not elsewhere classified: Secondary | ICD-10-CM | POA: Diagnosis not present

## 2019-05-08 DIAGNOSIS — M25562 Pain in left knee: Secondary | ICD-10-CM

## 2019-05-08 DIAGNOSIS — R2689 Other abnormalities of gait and mobility: Secondary | ICD-10-CM

## 2019-05-08 NOTE — Therapy (Signed)
[facility] Physical Therapy [address] [place], [place], [address] Phone: [phone]   Fax:  [phone]  Physical Therapy Treatment/Discharge PHYSICAL THERAPY DISCHARGE SUMMARY  Visits from Start of Care: 11  Current functional level related to goals / functional outcomes: See below   Remaining deficits: See below   Education / Equipment: HEP  Plan: Patient agrees to discharge.  Patient goals were met. Patient is being discharged due to meeting the stated rehab goals.  ?????       Patient Details  Name: ANJEL PARDO MRN: [id number] Date of Birth: Jul 18, 1945 Referring Provider (PT): [name], MD   Encounter Date: 05/08/2019  PT End of Session - 05/08/19 1951    Visit Number  11    Number of Visits  16    Date for PT Re-Evaluation  05/13/19    Authorization Type  MCR/BCBS    PT Start Time  1532    PT Stop Time  1610    PT Time Calculation (min)  38 min    Activity Tolerance  Patient tolerated treatment well    Behavior During Therapy  [facility] for tasks assessed/performed       Past Medical History:  Diagnosis Date  . Diabetes mellitus   . Hypercholesteremia   . Hypertension   . Patella fracture    left  . PVD (peripheral vascular disease) ([place])     Past Surgical History:  Procedure Laterality Date  . AMPUTATION    . 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 ORIF PATELLA Left 01/10/2019   Procedure: OPEN REDUCTION INTERNAL (ORIF) FIXATION LEFT PATELLA FRACTURE;  Surgeon: [name], MD;  Location: [place];  Service: Orthopedics;  Laterality: Left;    There were no vitals filed for this visit.  Subjective Assessment - 05/08/19 1946    Subjective  My knee is doing well, some stiffness but no pain, I feel ready for discharge.    Pertinent History  Lt knee patella ORIF    Currently in Pain?  No/denies         [facility] PT Assessment - 05/08/19 0001      Assessment   Medical Diagnosis  Lt patella ORIF    Referring Provider (PT)  [name], MD    Onset Date/Surgical Date  01/10/19      AROM   Left Knee Extension  0    Left Knee Flexion  115                   [facility] Adult PT Treatment/Exercise - 05/08/19 0001      Knee/Hip Exercises: Stretches   Active Hamstring Stretch  Left;2 reps;30 seconds    Active Hamstring Stretch Limitations  seated    Quad Stretch  Left;2 reps;30 seconds    Other Knee/Hip Stretches  heelslides AAROM supine 10 sec X 10  reps      Knee/Hip Exercises: Aerobic   Recumbent Bike  7 min L2      Knee/Hip Exercises: Machines for Strengthening   Total Gym Leg Press  LLE only 2x15 75#      Knee/Hip Exercises: Standing   Functional Squat  2 sets;10 reps    Functional Squat Limitations  at sink going down to touch chair then back up    Other Standing Knee Exercises  mini lunges X  10 bilat with one UE support and cues/demo for proper technique    Other Standing Knee Exercises  SLS 10 reps each side, holding 3 sec avg      Knee/Hip Exercises: Seated   Long Arc Quad  Left;20 reps    Long Arc Quad Weight  3 lbs.      Manual Therapy   Passive ROM  Lt knee flexion/extension               PT Short Term Goals - 05/08/19 1951      PT SHORT TERM GOAL #1   Title  independent with HEP    Status  Achieved    Target Date  04/15/19      PT SHORT TERM GOAL #2   Title  improve Lt knee PROM 0-100 for improved function    Baseline  1/4: -2-104    Status  Achieved    Target Date  04/29/19        PT Long Term Goals - 05/08/19 1951      PT LONG TERM GOAL #1   Title  independent with advanced HEP    Status  Achieved      PT LONG TERM GOAL #2   Title  improve Lt knee AROM 0-110 for improved function    Status  Achieved      PT LONG TERM GOAL #3   Title  LLE strength improved to at least 4/5 for improved  strength and function    Status  Achieved      PT LONG TERM GOAL #4   Title  amb without AD independently without gait deviations for improved mobility    Status  Achieved      PT LONG TERM GOAL #5   Title  report pain < 3/10 with activity for improved function    Status  Achieved            Plan - 05/08/19 1951    Clinical Impression Statement  He has met all PT goals. He has good strength and no pain. His only deficit is mild knee stiffness that should improve in time if he continues to work on his HEP at home. He will be discharged today and he had no further questions or concerns.    Personal Factors and Comorbidities  Time since onset of injury/illness/exacerbation;Comorbidity 3+    Comorbidities  PVD, HTN, DM    Examination-Activity Limitations  Stand;Locomotion Level;Bend;Stairs;Squat;Sleep    Examination-Participation Restrictions  Yard Work;Driving    Stability/Clinical Decision Making  Evolving/Moderate complexity    Rehab Potential  Good    PT Frequency  2x / week    PT Duration  8 weeks    PT Treatment/Interventions  ADLs/Self Care Home Management;Cryotherapy;Electrical Stimulation;Ultrasound;Moist Heat;Iontophoresis 35m/ml Dexamethasone;Gait training;Stair training;Functional mobility training;Therapeutic activities;Therapeutic exercise;Balance training;Patient/family education;Neuromuscular re-education;Manual techniques;Scar mobilization;Vasopneumatic Device;Taping;Dry needling;Passive range of motion    PT Next Visit Plan  work on strengthening, establish program for HEP; prepare for d/c if pt ready    PT Home Exercise Plan  Access Code: [email or web address], then added standing marches, H.S. curls, hip abd, and mini squat    Consulted and Agree with Plan of Care  Patient       Patient will benefit from skilled therapeutic intervention in order to improve the following deficits and impairments:  Abnormal gait, Pain, Increased muscle spasms, Increased fascial restricitons,  Decreased mobility, Decreased activity tolerance, Decreased range of motion, Decreased strength, Impaired flexibility, Difficulty walking, Decreased balance  Visit Diagnosis: Other  abnormalities of gait and mobility  Localized edema  Stiffness of left knee, not elsewhere classified  Acute pain of left knee  Muscle weakness (generalized)     Problem List Patient Active Problem List   Diagnosis Date Noted  . Quadriceps tendon rupture, left, initial encounter 01/10/2019  . Closed comminuted fracture of left patella, initial encounter 01/08/2019    Silvestre Mesi 05/08/2019, 7:54 PM  [facility] Physical Therapy [address] [place], [place], [address] Phone: [phone]   Fax:  [phone]  Name: JAIVYN GULLA MRN: [id number] Date of Birth: May 21, 1945

## 2019-05-13 ENCOUNTER — Encounter: Payer: Medicare Other | Admitting: Physical Therapy

## 2019-05-15 ENCOUNTER — Encounter: Payer: Medicare Other | Admitting: Physical Therapy

## 2019-05-20 ENCOUNTER — Encounter: Payer: Medicare Other | Admitting: Physical Therapy

## 2019-05-22 ENCOUNTER — Encounter: Payer: Medicare Other | Admitting: Physical Therapy

## 2019-05-27 ENCOUNTER — Encounter: Payer: Medicare Other | Admitting: Physical Therapy

## 2019-05-29 ENCOUNTER — Encounter: Payer: Medicare Other | Admitting: Physical Therapy

## 2019-07-25 DIAGNOSIS — E11621 Type 2 diabetes mellitus with foot ulcer: Secondary | ICD-10-CM | POA: Diagnosis not present

## 2019-07-25 DIAGNOSIS — L97418 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: Secondary | ICD-10-CM | POA: Diagnosis not present

## 2019-08-01 DIAGNOSIS — L97418 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: Secondary | ICD-10-CM | POA: Diagnosis not present

## 2019-08-29 ENCOUNTER — Ambulatory Visit: Payer: Medicare Other | Admitting: Podiatry

## 2019-09-13 ENCOUNTER — Other Ambulatory Visit: Payer: Self-pay

## 2019-09-13 ENCOUNTER — Inpatient Hospital Stay (HOSPITAL_COMMUNITY)
Admission: EM | Admit: 2019-09-13 | Discharge: 2019-09-16 | DRG: 256 | Disposition: A | Discharge status: Home / Self Care | Payer: Medicare Other | Attending: Internal Medicine | Admitting: Internal Medicine

## 2019-09-13 ENCOUNTER — Ambulatory Visit (INDEPENDENT_AMBULATORY_CARE_PROVIDER_SITE_OTHER): Payer: Medicare Other

## 2019-09-13 ENCOUNTER — Encounter (HOSPITAL_COMMUNITY): Payer: Self-pay

## 2019-09-13 ENCOUNTER — Ambulatory Visit (INDEPENDENT_AMBULATORY_CARE_PROVIDER_SITE_OTHER): Payer: Medicare Other | Admitting: Podiatry

## 2019-09-13 VITALS — BP 176/74 | HR 94 | Temp 99.5°F

## 2019-09-13 DIAGNOSIS — E11621 Type 2 diabetes mellitus with foot ulcer: Secondary | ICD-10-CM | POA: Diagnosis present

## 2019-09-13 DIAGNOSIS — I96 Gangrene, not elsewhere classified: Secondary | ICD-10-CM

## 2019-09-13 DIAGNOSIS — L97519 Non-pressure chronic ulcer of other part of right foot with unspecified severity: Secondary | ICD-10-CM | POA: Diagnosis present

## 2019-09-13 DIAGNOSIS — Z794 Long term (current) use of insulin: Secondary | ICD-10-CM | POA: Diagnosis not present

## 2019-09-13 DIAGNOSIS — I739 Peripheral vascular disease, unspecified: Secondary | ICD-10-CM | POA: Diagnosis not present

## 2019-09-13 DIAGNOSIS — I10 Essential (primary) hypertension: Secondary | ICD-10-CM | POA: Diagnosis present

## 2019-09-13 DIAGNOSIS — E7800 Pure hypercholesterolemia, unspecified: Secondary | ICD-10-CM | POA: Diagnosis present

## 2019-09-13 DIAGNOSIS — Z79899 Other long term (current) drug therapy: Secondary | ICD-10-CM | POA: Diagnosis not present

## 2019-09-13 DIAGNOSIS — E1152 Type 2 diabetes mellitus with diabetic peripheral angiopathy with gangrene: Secondary | ICD-10-CM | POA: Diagnosis present

## 2019-09-13 DIAGNOSIS — E785 Hyperlipidemia, unspecified: Secondary | ICD-10-CM | POA: Diagnosis present

## 2019-09-13 DIAGNOSIS — M79674 Pain in right toe(s): Secondary | ICD-10-CM | POA: Diagnosis not present

## 2019-09-13 DIAGNOSIS — E11628 Type 2 diabetes mellitus with other skin complications: Secondary | ICD-10-CM | POA: Diagnosis present

## 2019-09-13 DIAGNOSIS — Z89422 Acquired absence of other left toe(s): Secondary | ICD-10-CM | POA: Diagnosis not present

## 2019-09-13 DIAGNOSIS — L03115 Cellulitis of right lower limb: Secondary | ICD-10-CM | POA: Diagnosis present

## 2019-09-13 DIAGNOSIS — Z20822 Contact with and (suspected) exposure to covid-19: Secondary | ICD-10-CM | POA: Diagnosis present

## 2019-09-13 DIAGNOSIS — M8610 Other acute osteomyelitis, unspecified site: Secondary | ICD-10-CM

## 2019-09-13 DIAGNOSIS — Z7982 Long term (current) use of aspirin: Secondary | ICD-10-CM

## 2019-09-13 DIAGNOSIS — Z7902 Long term (current) use of antithrombotics/antiplatelets: Secondary | ICD-10-CM

## 2019-09-13 DIAGNOSIS — S98131A Complete traumatic amputation of one right lesser toe, initial encounter: Secondary | ICD-10-CM | POA: Diagnosis not present

## 2019-09-13 DIAGNOSIS — L97509 Non-pressure chronic ulcer of other part of unspecified foot with unspecified severity: Secondary | ICD-10-CM | POA: Diagnosis not present

## 2019-09-13 DIAGNOSIS — S82042A Displaced comminuted fracture of left patella, initial encounter for closed fracture: Secondary | ICD-10-CM | POA: Diagnosis not present

## 2019-09-13 DIAGNOSIS — E1169 Type 2 diabetes mellitus with other specified complication: Secondary | ICD-10-CM | POA: Diagnosis present

## 2019-09-13 DIAGNOSIS — E1142 Type 2 diabetes mellitus with diabetic polyneuropathy: Secondary | ICD-10-CM

## 2019-09-13 DIAGNOSIS — M86171 Other acute osteomyelitis, right ankle and foot: Secondary | ICD-10-CM | POA: Diagnosis present

## 2019-09-13 DIAGNOSIS — Z9889 Other specified postprocedural states: Secondary | ICD-10-CM

## 2019-09-13 DIAGNOSIS — Z89421 Acquired absence of other right toe(s): Secondary | ICD-10-CM | POA: Diagnosis not present

## 2019-09-13 LAB — BASIC METABOLIC PANEL
Anion gap: 11 (ref 5–15)
BUN: 19 mg/dL (ref 8–23)
CO2: 27 mmol/L (ref 22–32)
Calcium: 9.2 mg/dL (ref 8.9–10.3)
Chloride: 103 mmol/L (ref 98–111)
Creatinine, Ser: 1.49 mg/dL — ABNORMAL HIGH (ref 0.61–1.24)
GFR calc Af Amer: 52 mL/min — ABNORMAL LOW (ref >60)
GFR calc non Af Amer: 45 mL/min — ABNORMAL LOW (ref >60)
Glucose, Bld: 142 mg/dL — ABNORMAL HIGH (ref 70–99)
Potassium: 4.4 mmol/L (ref 3.5–5.1)
Sodium: 141 mmol/L (ref 135–145)

## 2019-09-13 LAB — CBC WITH DIFFERENTIAL/PLATELET
Abs Immature Granulocytes: 0.02 10*3/uL (ref 0.00–0.07)
Basophils Absolute: 0.1 10*3/uL (ref 0.0–0.1)
Basophils Relative: 1 %
Eosinophils Absolute: 0.1 10*3/uL (ref 0.0–0.5)
Eosinophils Relative: 1 %
HCT: 49.2 % (ref 39.0–52.0)
Hemoglobin: 16.9 g/dL (ref 13.0–17.0)
Immature Granulocytes: 0 %
Lymphocytes Relative: 12 %
Lymphs Abs: 1.2 10*3/uL (ref 0.7–4.0)
MCH: 30 pg (ref 26.0–34.0)
MCHC: 34.3 g/dL (ref 30.0–36.0)
MCV: 87.2 fL (ref 80.0–100.0)
Monocytes Absolute: 0.6 10*3/uL (ref 0.1–1.0)
Monocytes Relative: 6 %
Neutro Abs: 8.3 10*3/uL — ABNORMAL HIGH (ref 1.7–7.7)
Neutrophils Relative %: 80 %
Platelets: 232 10*3/uL (ref 150–400)
RBC: 5.64 MIL/uL (ref 4.22–5.81)
RDW: 12.1 % (ref 11.5–15.5)
WBC: 10.2 10*3/uL (ref 4.0–10.5)
nRBC: 0 % (ref 0.0–0.2)

## 2019-09-13 LAB — GLUCOSE, CAPILLARY: Glucose-Capillary: 107 mg/dL — ABNORMAL HIGH (ref 70–99)

## 2019-09-13 LAB — CBG MONITORING, ED: Glucose-Capillary: 149 mg/dL — ABNORMAL HIGH (ref 70–99)

## 2019-09-13 LAB — HEMOGLOBIN A1C
Hgb A1c MFr Bld: 6.1 % — ABNORMAL HIGH (ref 4.8–5.6)
Mean Plasma Glucose: 128.37 mg/dL

## 2019-09-13 LAB — SARS CORONAVIRUS 2 BY RT PCR (HOSPITAL ORDER, PERFORMED IN ~~LOC~~ HOSPITAL LAB): SARS Coronavirus 2: NEGATIVE

## 2019-09-13 MED ORDER — ONDANSETRON HCL 4 MG/2ML IJ SOLN: 4.0000 mg | Freq: Four times a day (QID) | INTRAMUSCULAR | Status: DC | PRN

## 2019-09-13 MED ORDER — INSULIN ASPART 100 UNIT/ML ~~LOC~~ SOLN
0.0000 [IU] | Freq: Three times a day (TID) | SUBCUTANEOUS | Status: DC
  Administered 2019-09-15: 2 [IU] via SUBCUTANEOUS
  Administered 2019-09-15: 3 [IU] via SUBCUTANEOUS
  Administered 2019-09-16: 2 [IU] via SUBCUTANEOUS
  Filled 2019-09-13: qty 0.15

## 2019-09-13 MED ORDER — HYDROCODONE-ACETAMINOPHEN 5-325 MG PO TABS
1.0000 | ORAL_TABLET | ORAL | Status: DC | PRN
  Administered 2019-09-13 – 2019-09-14 (×3): 2 via ORAL
  Administered 2019-09-14: 1 via ORAL
  Administered 2019-09-15 (×2): 2 via ORAL
  Administered 2019-09-15: 1 via ORAL
  Administered 2019-09-16: 2 via ORAL
  Filled 2019-09-13: qty 2
  Filled 2019-09-13: qty 1
  Filled 2019-09-13 (×6): qty 2

## 2019-09-13 MED ORDER — INSULIN ASPART 100 UNIT/ML ~~LOC~~ SOLN
0.0000 [IU] | Freq: Every day | SUBCUTANEOUS | Status: DC
  Filled 2019-09-13: qty 0.05

## 2019-09-13 MED ORDER — HEPARIN SODIUM (PORCINE) 5000 UNIT/ML IJ SOLN
5000.0000 [IU] | Freq: Three times a day (TID) | INTRAMUSCULAR | Status: DC
  Administered 2019-09-13 – 2019-09-16 (×7): 5000 [IU] via SUBCUTANEOUS
  Filled 2019-09-13 (×9): qty 1

## 2019-09-13 MED ORDER — VANCOMYCIN HCL IN DEXTROSE 1-5 GM/200ML-% IV SOLN: 1000.0000 mg | INTRAVENOUS | Status: DC

## 2019-09-13 MED ORDER — HYDROMORPHONE HCL 1 MG/ML IJ SOLN
0.5000 mg | INTRAMUSCULAR | Status: DC | PRN
  Administered 2019-09-14 – 2019-09-15 (×2): 1 mg via INTRAVENOUS
  Filled 2019-09-13 (×2): qty 1

## 2019-09-13 MED ORDER — VANCOMYCIN HCL 1500 MG/300ML IV SOLN
1500.0000 mg | Freq: Once | INTRAVENOUS | Status: AC
  Administered 2019-09-13: 1500 mg via INTRAVENOUS
  Filled 2019-09-13: qty 300

## 2019-09-13 MED ORDER — SENNOSIDES-DOCUSATE SODIUM 8.6-50 MG PO TABS: 1.0000 | ORAL_TABLET | Freq: Every evening | ORAL | Status: DC | PRN

## 2019-09-13 MED ORDER — SODIUM CHLORIDE 0.9 % IV SOLN
1.0000 g | Freq: Once | INTRAVENOUS | Status: AC
  Administered 2019-09-13: 1 g via INTRAVENOUS
  Filled 2019-09-13: qty 10

## 2019-09-13 MED ORDER — SODIUM CHLORIDE 0.9 % IV SOLN
2.0000 g | INTRAVENOUS | Status: AC
  Administered 2019-09-14 – 2019-09-16 (×3): 2 g via INTRAVENOUS
  Filled 2019-09-13 (×3): qty 2

## 2019-09-13 MED ORDER — ONDANSETRON HCL 4 MG PO TABS: 4.0000 mg | ORAL_TABLET | Freq: Four times a day (QID) | ORAL | Status: DC | PRN

## 2019-09-13 MED ORDER — ACETAMINOPHEN 325 MG PO TABS: 650.0000 mg | ORAL_TABLET | Freq: Four times a day (QID) | ORAL | Status: DC | PRN

## 2019-09-13 MED ORDER — VANCOMYCIN HCL 1500 MG/300ML IV SOLN
1500.0000 mg | INTRAVENOUS | Status: DC
  Administered 2019-09-14 – 2019-09-16 (×3): 1500 mg via INTRAVENOUS
  Filled 2019-09-13 (×3): qty 300

## 2019-09-13 MED ORDER — SODIUM CHLORIDE 0.9 % IV SOLN
1.0000 g | INTRAVENOUS | Status: AC
  Administered 2019-09-13: 1 g via INTRAVENOUS
  Filled 2019-09-13: qty 10

## 2019-09-13 MED ORDER — BISACODYL 5 MG PO TBEC
5.0000 mg | DELAYED_RELEASE_TABLET | Freq: Every day | ORAL | Status: DC | PRN
  Filled 2019-09-13: qty 1

## 2019-09-13 MED ORDER — ACETAMINOPHEN 650 MG RE SUPP: 650.0000 mg | Freq: Four times a day (QID) | RECTAL | Status: DC | PRN

## 2019-09-13 NOTE — ED Notes (Signed)
Transport called.

## 2019-09-13 NOTE — ED Notes (Signed)
Attempted to call report to floor, RN to call back. 

## 2019-09-13 NOTE — ED Provider Notes (Signed)
[facility] DEPT Provider Note   CSN: [id number] Arrival date & time: 09/13/19  1121     History Chief Complaint  Patient presents with  . Toe Amputation    Zachary [name] is a 74 y.o. male history diabetes, hypertension, hypercholesterolemia, PVD.  Patient presents today for admission and amputation due to gangrene of the right second toe.  He was seen by [name] this morning who sent patient to the ER for admission.  Patient reports he has had infection of his right second toe ongoing for 1 month describes a minimal throbbing pain of the area nonradiating worse with walking improved with rest.  Patient had wound dressed and placed in a boot at foot and ankle center prior to arrival.  Denies fever, nausea/vomiting, fall/injury, chest pain/shortness of breath, abdominal pain, numbness/weakness, tingling or any additional concerns.  HPI     Past Medical History:  Diagnosis Date  . Diabetes mellitus   . Hypercholesteremia   . Hypertension   . Patella fracture    left  . PVD (peripheral vascular disease) [facility])     Patient Active Problem List   Diagnosis Date Noted  . Quadriceps tendon rupture, left, initial encounter 01/10/2019  . Closed comminuted fracture of left patella, initial encounter 01/08/2019    Past Surgical History:  Procedure Laterality Date  . AMPUTATION    . 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 ORIF PATELLA Left 01/10/2019   Procedure: OPEN REDUCTION INTERNAL (ORIF) FIXATION LEFT PATELLA FRACTURE;  Surgeon: [name], MD;  Location: [facility];  Service: Orthopedics;  Laterality: Left;       History reviewed. No pertinent family history.  Social History   Tobacco Use  . Smoking status: Never Smoker  . Smokeless tobacco: Never Used  Substance Use Topics  . Alcohol  use: No  . Drug use: No    Home Medications Prior to Admission medications   Medication Sig Start Date End Date Taking? Authorizing Provider  acarbose (PRECOSE) 50 MG tablet Take 50 mg by mouth daily.    [provider]  acetaminophen (TYLENOL) 500 MG tablet Take 1,000 mg by mouth every 6 (six) hours as needed.    [provider]  aspirin EC 81 MG tablet Take 81 mg by mouth daily.    [provider]  clopidogrel (PLAVIX) 75 MG tablet Take 75 mg by mouth daily.    [provider]  dapagliflozin propanediol (FARXIGA) 5 MG TABS tablet Take 5 mg by mouth daily.    [provider]  diltiazem (DILACOR XR) 120 MG 24 hr capsule Take 120 mg by mouth daily.    [provider]  ezetimibe (ZETIA) 10 MG tablet Take 10 mg by mouth daily.    [provider]  fish oil-omega-3 fatty acids 1000 MG capsule Take 2 g by mouth daily.    [provider]  Insulin Glargine-Lixisenatide (SOLIQUA) 100-33 UNT-MCG/ML SOPN Inject 60 Units into the skin.    [provider]  methocarbamol (ROBAXIN) 750 MG tablet Take 1 tablet (750 mg total) by mouth 2 (two) times daily as needed for muscle spasms. 03/06/19   [name], MD  ondansetron (ZOFRAN) 4 MG tablet Take 1-2 tablets (4-8 mg total) by mouth every 8 (eight) hours as needed for nausea or vomiting. 01/10/19  [name], MD  oxyCODONE-acetaminophen (PERCOCET) 5-325 MG tablet Take 1-2 tablets by mouth every 8 (eight) hours as needed for severe pain. 01/10/19   [name], MD    Allergies    Patient has no known allergies.  Review of Systems   Review of Systems Ten systems are reviewed and are negative for acute change except as noted in the HPI  Physical Exam Updated Vital Signs BP (!) 145/78   Pulse 93   Temp 98.2 F (36.8 C) (Oral)   Resp 18   Ht 5\' 8"  (1.727 m)   Wt 86.2 kg   SpO2 97%   BMI 28.89 kg/m   Physical Exam Constitutional:      General: He is not in acute  distress.    Appearance: Normal appearance. He is well-developed. He is not ill-appearing or diaphoretic.  HENT:     Head: Normocephalic and atraumatic.  Eyes:     General: Vision grossly intact. Gaze aligned appropriately.     Pupils: Pupils are equal, round, and reactive to light.  Neck:     Trachea: Trachea and phonation normal.  Pulmonary:     Effort: Pulmonary effort is normal. No respiratory distress.  Abdominal:     General: There is no distension.     Palpations: Abdomen is soft.     Tenderness: There is no abdominal tenderness. There is no guarding or rebound.  Musculoskeletal:        General: Normal range of motion.     Cervical back: Normal range of motion.       Feet:  Feet:     Comments: Necrosis right second toe see picture below. Skin:    General: Skin is warm and dry.  Neurological:     Mental Status: He is alert.     GCS: GCS eye subscore is 4. GCS verbal subscore is 5. GCS motor subscore is 6.     Comments: Speech is clear and goal oriented, follows commands Major Cranial nerves without deficit, no facial droop Moves extremities without ataxia, coordination intact  Psychiatric:        Behavior: Behavior normal.       ED Results / Procedures / Treatments   Labs (all labs ordered are listed, but only abnormal results are displayed) Labs Reviewed  CBC WITH DIFFERENTIAL/PLATELET - Abnormal; Notable for the following components:      Result Value   Neutro Abs 8.3 (*)    All other components within normal limits  BASIC METABOLIC PANEL - Abnormal; Notable for the following components:   Glucose, Bld 142 (*)    Creatinine, Ser 1.49 (*)    GFR calc non Af Amer 45 (*)    GFR calc Af Amer 52 (*)    All other components within normal limits  SARS CORONAVIRUS 2 BY RT PCR (HOSPITAL ORDER, [facility])  CULTURE, BLOOD (ROUTINE X 2)  CULTURE, BLOOD (ROUTINE X 2)    EKG None  Radiology No results found.  Procedures Procedures  (including critical care time)  Medications Ordered in ED Medications  cefTRIAXone (ROCEPHIN) 1 g in sodium chloride 0.9 % 100 mL IVPB (1 g Intravenous New Bag/Given 09/13/19 1243)  vancomycin (VANCOCIN) IVPB 1000 mg/200 mL premix (has no administration in time range)    ED Course  I have reviewed the triage vital signs and the nursing notes.  Pertinent labs & imaging results that were available during my care of the patient were reviewed  by me and considered in my medical decision making (see chart for details).  Clinical Course as of Sep 13 1255  Fri Sep 13, 2019  1150 [phone]; [name]:    [BM]  1207 Vanc, rocephin, admit medicine   [BM]    Clinical Course User Index [BM] Gari Crown   MDM Rules/Calculators/A&P                      Additional History Obtained: 1. Nursing notes from this visit.  74 year old male arrives from foot and ankle with specialist for admission and amputation of the right second toe due to gangrene.  On arrival patient well-appearing no acute distress he denies any fever nausea vomiting or any additional symptoms.  Basic blood work including CBC, BMP and Covid test ordered.  I discussed case over the phone with [name] at 12:07 PM, [name] asked that patient be admitted to hospitalist service and started on IV vancomycin and Rocephin, they plan for amputation sometime over the weekend performed by either [name] or his partner. - Patient reevaluated resting comfortably no acute distress he states understanding of care plan he has no questions or concerns he is agreeable.  I ordered, reviewed and interpreted labs which include: CBC which shows no leukocytosis or evidence of anemia. BMP shows baseline creatinine of 1.49 no evidence of acute kidney injury, no emergent electrolyte derangement. Covid test and blood cultures pending.  On chart review I was able to find a x-ray of patient's right foot obtained around 10:10 AM this  morning.  It has not been interpreted by radiologist yet.  There are no obvious fractures or dislocations.  Soft tissue abnormality noted at the right second toe. Case discussed with Dr. Alvino Chapel who agrees with plan and admission. Consult placed to hospitalist for admission. - 1:07 PM: Discussed case with Dr. Avon Gully, patient has been accepted to hospitalist service.  Note: Portions of this report may have been transcribed using voice recognition software. Every effort was made to ensure accuracy; however, inadvertent computerized transcription errors may still be present. Final Clinical Impression(s) / ED Diagnoses Final diagnoses:  Gangrenous toe [facility])    Rx / DC Orders ED Discharge Orders    None       Gari Crown 09/13/19 1308    Davonna Belling, MD 09/13/19 1512

## 2019-09-13 NOTE — ED Triage Notes (Addendum)
Pt states [place] (pt seen by [name] today) told him to come to [facility] to have his second toe, right foot, amputated for gangrene. Pt ambulatory and wearing a boot on right foot. Pt diabetic, reports CBG 98 this AM.

## 2019-09-13 NOTE — H&P (Signed)
History and Physical    Zachary [name] BMW:[id number] DOB: 1945-12-17 DOA: 09/13/2019  PCP: [name], MD   Chief Complaint: Sent by podiatry for evaluation  HPI: Zachary [name] is a 74 y.o. male with medical history significant of diabetes hyperlipidemia hypertension and peripheral vascular disease who presents with right second toe ulceration infection and purulence from podiatry office with [name] for patient.  Patient has been caring for his wife who recently had a stroke and has been essentially ignoring his own foot infection for at least a few weeks per his history, [name] was attempting to salvage with antibiotics which failed in the outpatient setting now recommending amputation.  ED Course: Evaluated in the ED, without leukocytosis fever or any sepsis criteria, mild AKI with creatinine at 1.5 but otherwise patient feels quite well other than painful right second toe.  After [facility] recommending ceftriaxone and vancomycin with hopeful amputation the next 24 to 48 hours pending his schedule.  Hospitalist called to admit.  Review of Systems: As per HPI denies nausea, vomiting, diarrhea, constipation, headache, fevers, chills..   Assessment/Plan Active Problems:   Acute osteomyelitis (HCC)   Osteomyelitis of the second toe, right, POA -Continue ceftriaxone and vancomycin per [name] with podiatry -Tentatively planned amputation this weekend per podiatry discussion -Continue analgesia IV fluids and antibiotics per podiatry  Diabetes mellitus type 2, well controlled -Continue sliding scale insulin perioperatively hypoglycemic protocol Lab Results  Component Value Date   HGBA1C 6.1 (H) 09/13/2019   Hypertension, essential -Resume home medications once verified -med reconciliation still pending   Hyperlipidemia -Unclear if patient is on statin, resume medications once verified  PVD -Resume medications once verified, presumably on aspirin and Plavix but med reconciliation  currently pending -we will discuss with podiatry as these may need to be held perioperatively  DVT prophylaxis: Heparin perioperatively Code Status: Full Family Communication: Wife at bedside Status is: Inpatient  Dispo: The patient is from: Home              Anticipated d/c is to: Home              Anticipated d/c date is: 48 to 72 hours pending clinical course and surgical evaluation              Patient currently not medically stable for discharge given need for IV antibiotics and surgical evaluation  Consultants:   Podiatry  Procedures:   Per podiatry   Past Medical History:  Diagnosis Date   Diabetes mellitus    Hypercholesteremia    Hypertension    Patella fracture    left   PVD (peripheral vascular disease) ([facility])     Past Surgical History:  Procedure Laterality Date   AMPUTATION     AMPUTATION  11/28/2011   Procedure: AMPUTATION RAY;  Surgeon: [name], MD;  Location: [facility];  Service: Orthopedics;  Laterality: Left;  left 2nd toe amputation   ORIF PATELLA Left 01/10/2019   Procedure: OPEN REDUCTION INTERNAL (ORIF) FIXATION LEFT PATELLA FRACTURE;  Surgeon: [name], MD;  Location: [facility];  Service: Orthopedics;  Laterality: Left;     reports that he has never smoked. He has never used smokeless tobacco. He reports that he does not drink alcohol or use drugs.  No Known Allergies  History reviewed. No pertinent family history.  Prior to Admission medications   Medication Sig Start Date End Date Taking? Authorizing Provider  acarbose (PRECOSE) 50 MG tablet Take 50  mg by mouth daily.    [provider]  acetaminophen (TYLENOL) 500 MG tablet Take 1,000 mg by mouth every 6 (six) hours as needed.    [provider]  aspirin EC 81 MG tablet Take 81 mg by mouth daily.    [provider]  clopidogrel (PLAVIX) 75 MG tablet Take 75 mg by mouth daily.    [provider]  dapagliflozin propanediol  (FARXIGA) 5 MG TABS tablet Take 5 mg by mouth daily.    [provider]  diltiazem (DILACOR XR) 120 MG 24 hr capsule Take 120 mg by mouth daily.    [provider]  ezetimibe (ZETIA) 10 MG tablet Take 10 mg by mouth daily.    [provider]  fish oil-omega-3 fatty acids 1000 MG capsule Take 2 g by mouth daily.    [provider]  Insulin Glargine-Lixisenatide (SOLIQUA) 100-33 UNT-MCG/ML SOPN Inject 60 Units into the skin.    [provider]  methocarbamol (ROBAXIN) 750 MG tablet Take 1 tablet (750 mg total) by mouth 2 (two) times daily as needed for muscle spasms. 03/06/19   [name], MD  ondansetron (ZOFRAN) 4 MG tablet Take 1-2 tablets (4-8 mg total) by mouth every 8 (eight) hours as needed for nausea or vomiting. 01/10/19   [name], MD  oxyCODONE-acetaminophen (PERCOCET) 5-325 MG tablet Take 1-2 tablets by mouth every 8 (eight) hours as needed for severe pain. 01/10/19   [name], MD    Physical Exam: Vitals:   09/13/19 1145 09/13/19 1215 09/13/19 1230 09/13/19 1245  BP: (!) 148/69 135/76 (!) 145/78   Pulse: 93 91 93   Resp:   18   Temp:      TempSrc:      SpO2: 95% 97% 97%   Weight:    86.2 kg  Height:    5\' 8"  (1.727 m)    Constitutional: NAD, calm, comfortable Vitals:   09/13/19 1145 09/13/19 1215 09/13/19 1230 09/13/19 1245  BP: (!) 148/69 135/76 (!) 145/78   Pulse: 93 91 93   Resp:   18   Temp:      TempSrc:      SpO2: 95% 97% 97%   Weight:    86.2 kg  Height:    5\' 8"  (1.727 m)   General:  Pleasantly resting in bed, No acute distress. HEENT:  Normocephalic atraumatic.  Sclerae nonicteric, noninjected.  Extraocular movements intact bilaterally. Neck:  Without mass or deformity.  Trachea is midline. Lungs:  Clear to auscultate bilaterally without rhonchi, wheeze, or rales. Heart:  Regular rate and rhythm.  Without murmurs, rubs, or gallops. Abdomen:  Soft, nontender, nondistended.  Without guarding or  rebound. Extremities: Without cyanosis, clubbing, edema, right great second toe purulent erythematous to the base.  Left medial to toes amputated. Vascular:  Dorsalis pedis and posterior tibial pulses palpable bilaterally. Skin:  Warm and dry, no erythema, no ulcerations.   CBC: Recent Labs  Lab 09/13/19 1154  WBC 10.2  NEUTROABS 8.3*  HGB 16.9  HCT 49.2  MCV 87.2  PLT 151   Basic Metabolic Panel: Recent Labs  Lab 09/13/19 1154  NA 141  K 4.4  CL 103  CO2 27  GLUCOSE 142*  BUN 19  CREATININE 1.49*  CALCIUM 9.2   GFR: Estimated Creatinine Clearance: 45 mL/min (A) (by C-G formula based on SCr of 1.49 mg/dL (H)). Liver Function Tests: No results for input(s): AST, ALT, ALKPHOS, BILITOT, PROT, ALBUMIN in  the last 168 hours. No results for input(s): LIPASE, AMYLASE in the last 168 hours. No results for input(s): AMMONIA in the last 168 hours. Coagulation Profile: No results for input(s): INR, PROTIME in the last 168 hours. Cardiac Enzymes: No results for input(s): CKTOTAL, CKMB, CKMBINDEX, TROPONINI in the last 168 hours. BNP (last 3 results) No results for input(s): PROBNP in the last 8760 hours. HbA1C: No results for input(s): HGBA1C in the last 72 hours. CBG: No results for input(s): GLUCAP in the last 168 hours. Lipid Profile: No results for input(s): CHOL, HDL, LDLCALC, TRIG, CHOLHDL, LDLDIRECT in the last 72 hours. Thyroid Function Tests: No results for input(s): TSH, T4TOTAL, FREET4, T3FREE, THYROIDAB in the last 72 hours. Anemia Panel: No results for input(s): VITAMINB12, FOLATE, FERRITIN, TIBC, IRON, RETICCTPCT in the last 72 hours. Urine analysis: No results found for: COLORURINE, APPEARANCEUR, LABSPEC, PHURINE, GLUCOSEU, HGBUR, BILIRUBINUR, KETONESUR, PROTEINUR, UROBILINOGEN, NITRITE, LEUKOCYTESUR  Radiological Exams on Admission: No results found.  [facility]  If 7PM-7AM, please contact  night-coverage [email or web address]   09/13/2019, 1:10 PM

## 2019-09-13 NOTE — ED Notes (Signed)
Informed patient's visitors, only one of them can visit per policy-son stated he was not going to leave his mother Zachary [name] was not willing to hear explanation, verbally aggressive

## 2019-09-13 NOTE — Progress Notes (Signed)
A consult was received from an ED provider for vancomycin per pharmacy dosing.  The patient's profile has been reviewed for ht/wt/allergies/indication/available labs.    No weight in chart. STAT ht/wt order entered.  A one time order has been placed for vancomycin 1000 mg IV once.    Further antibiotics/pharmacy consults should be ordered by admitting physician if indicated.                       Thank you, Lenis Noon, PharmD 09/13/2019  12:30 PM

## 2019-09-13 NOTE — Progress Notes (Signed)
Admitted from the ED. Pt in bed. Stated he has already eaten.  No needs at this time.

## 2019-09-13 NOTE — Progress Notes (Signed)
Orders placed for consent for tomorrow's procedure. Spoke to patient earlier to inform of procedure. Plan for procedure tomorrow early AM. NPO after MN.  [name], [name]

## 2019-09-13 NOTE — Progress Notes (Signed)
Pharmacy Antibiotic Note  Zachary [name] is a 74 y.o. male admitted on 09/13/2019 with osteomyelitis.  Pharmacy has been consulted for vancomycin dosing.  Plan:  Vancomycin 1500 IV every 24 hours.  Goal trough 15-20 mcg/mL.   Ceftriaxone 2 gr IV q24h   Monitor clinical course, renal function, cultures as available   Height: 5\' 8"  (172.7 cm) Weight: 86.2 kg (190 lb) IBW/kg (Calculated) : 68.4  Temp (24hrs), Avg:98.9 F (37.2 C), Min:98.2 F (36.8 C), Max:99.5 F (37.5 C)  Recent Labs  Lab 09/13/19 1154  WBC 10.2  CREATININE 1.49*    Estimated Creatinine Clearance: 45 mL/min (A) (by C-G formula based on SCr of 1.49 mg/dL (H)).    No Known Allergies  Antimicrobials this admission: 6/4 ceftriaxone >>  6/4 vancomycin >>   Dose adjustments this admission:   Microbiology results: 6/4 BCx:   Thank you for allowing pharmacy to be a part of this patient's care.   Royetta Asal, PharmD, BCPS 09/13/2019 1:41 PM

## 2019-09-13 NOTE — Progress Notes (Signed)
°  Subjective:  Patient ID: Zachary [name], male    DOB: 04-18-1945,  MRN: [id number]  Chief Complaint  Patient presents with   Wound Check    Right 2nd digit infection. Pt states 1 month duration, slight drainage. Pt denies fever/chills/nausea/vomiting. Pt states he is diabetic, glucose today 98 this morning.    74 y.o. male presents for wound care. Hx confirmed with patient.  Objective:  Physical Exam: Wound Location: Right 2nd toe Wound Base: Necrotic eschar Peri-wound: Reddened Exudate: None: wound tissue dry + induration, + tenderness and erythema noted along wound margins extending 3 cm Palpable DP pulse     Radiographs:  X-ray of the right foot: no soft tissue emphysema, no signs of osteomyelitis   Vitals:   09/13/19 1025  BP: (!) 176/74  Pulse: 94  Temp: 99.5 F (37.5 C)    Assessment:   1. Gangrene of toe of right foot (Zachary [name])   2. PAD (peripheral artery disease) (HCC)   3. Pain of toe of right foot   4. Ulcer of toe, right, with unspecified severity (Zachary [name])   5. DM type 2 with diabetic peripheral neuropathy (Zachary [name])     Plan:  Patient was evaluated and treated and all questions answered.  Gangrene Right 2nd toe  -XR reviewed with patient -Dressing applied consisting of betadine, sterile gauze and kerlix -Offload ulcer with CAM boot -CAM boot dispensed  -Patient to proceed to the ED for evaluation given signs of infection. Will need amputation. Patient understands plan and agrees to proceed.  No follow-ups on file.

## 2019-09-13 NOTE — H&P (View-Only) (Signed)
  Subjective:  Patient ID: ARLING CERONE, male    DOB: [birth year],  MRN: [id number]  Chief Complaint  Patient presents with  . Wound Check    Right 2nd digit infection. Pt states 1 month duration, slight drainage. Pt denies fever/chills/nausea/vomiting. Pt states he is diabetic, glucose today 98 this morning.    74 y.o. male presents for wound care. Hx confirmed with patient.  Objective:  Physical Exam: Wound Location: Right 2nd toe Wound Base: Necrotic eschar Peri-wound: Reddened Exudate: None: wound tissue dry + induration, + tenderness and erythema noted along wound margins extending 3 cm Palpable DP pulse     Radiographs:  X-ray of the right foot: no soft tissue emphysema, no signs of osteomyelitis   Vitals:   09/13/19 1025  BP: (!) 176/74  Pulse: 94  Temp: 99.5 F (37.5 C)    Assessment:   1. Gangrene of toe of right foot ([facility])   2. PAD (peripheral artery disease) (HCC)   3. Pain of toe of right foot   4. Ulcer of toe, right, with unspecified severity ([facility])   5. DM type 2 with diabetic peripheral neuropathy ([facility])     Plan:  Patient was evaluated and treated and all questions answered.  Gangrene Right 2nd toe  -XR reviewed with patient -Dressing applied consisting of betadine, sterile gauze and kerlix -Offload ulcer with CAM boot -CAM boot dispensed  -Patient to proceed to the ED for evaluation given signs of infection. Will need amputation. Patient understands plan and agrees to proceed.  No follow-ups on file.

## 2019-09-14 ENCOUNTER — Inpatient Hospital Stay (HOSPITAL_COMMUNITY): Payer: Medicare Other | Admitting: Anesthesiology

## 2019-09-14 ENCOUNTER — Inpatient Hospital Stay (HOSPITAL_COMMUNITY): Payer: Medicare Other

## 2019-09-14 ENCOUNTER — Encounter (HOSPITAL_COMMUNITY)
Admission: EM | Disposition: A | Discharge status: Home / Self Care | Payer: Self-pay | Source: Home / Self Care | Attending: Internal Medicine

## 2019-09-14 DIAGNOSIS — I96 Gangrene, not elsewhere classified: Secondary | ICD-10-CM

## 2019-09-14 HISTORY — PX: AMPUTATION TOE: SHX6595

## 2019-09-14 LAB — CBC
HCT: 43.1 % (ref 39.0–52.0)
Hemoglobin: 14.4 g/dL (ref 13.0–17.0)
MCH: 29.6 pg (ref 26.0–34.0)
MCHC: 33.4 g/dL (ref 30.0–36.0)
MCV: 88.5 fL (ref 80.0–100.0)
Platelets: 191 10*3/uL (ref 150–400)
RBC: 4.87 MIL/uL (ref 4.22–5.81)
RDW: 12.2 % (ref 11.5–15.5)
WBC: 6.2 10*3/uL (ref 4.0–10.5)
nRBC: 0 % (ref 0.0–0.2)

## 2019-09-14 LAB — COMPREHENSIVE METABOLIC PANEL
ALT: 15 U/L (ref 0–44)
AST: 14 U/L — ABNORMAL LOW (ref 15–41)
Albumin: 3.4 g/dL — ABNORMAL LOW (ref 3.5–5.0)
Alkaline Phosphatase: 58 U/L (ref 38–126)
Anion gap: 9 (ref 5–15)
BUN: 22 mg/dL (ref 8–23)
CO2: 29 mmol/L (ref 22–32)
Calcium: 8.7 mg/dL — ABNORMAL LOW (ref 8.9–10.3)
Chloride: 102 mmol/L (ref 98–111)
Creatinine, Ser: 1.42 mg/dL — ABNORMAL HIGH (ref 0.61–1.24)
GFR calc Af Amer: 55 mL/min — ABNORMAL LOW (ref >60)
GFR calc non Af Amer: 48 mL/min — ABNORMAL LOW (ref >60)
Glucose, Bld: 95 mg/dL (ref 70–99)
Potassium: 4 mmol/L (ref 3.5–5.1)
Sodium: 140 mmol/L (ref 135–145)
Total Bilirubin: 0.5 mg/dL (ref 0.3–1.2)
Total Protein: 6.9 g/dL (ref 6.5–8.1)

## 2019-09-14 LAB — GLUCOSE, CAPILLARY
Glucose-Capillary: 102 mg/dL — ABNORMAL HIGH (ref 70–99)
Glucose-Capillary: 106 mg/dL — ABNORMAL HIGH (ref 70–99)
Glucose-Capillary: 91 mg/dL (ref 70–99)
Glucose-Capillary: 92 mg/dL (ref 70–99)
Glucose-Capillary: 138 mg/dL — ABNORMAL HIGH (ref 70–99)

## 2019-09-14 LAB — SURGICAL PCR SCREEN
MRSA, PCR: NEGATIVE
Staphylococcus aureus: NEGATIVE

## 2019-09-14 SURGERY — AMPUTATION, TOE
Anesthesia: Monitor Anesthesia Care | Site: Toe | Laterality: Right

## 2019-09-14 MED ORDER — BUPIVACAINE HCL (PF) 0.5 % IJ SOLN
INTRAMUSCULAR | Status: DC | PRN
  Administered 2019-09-14: 10 mL

## 2019-09-14 MED ORDER — VANCOMYCIN HCL 1000 MG IV SOLR
INTRAVENOUS | Status: AC
  Filled 2019-09-14: qty 1000

## 2019-09-14 MED ORDER — LACTATED RINGERS IV SOLN: INTRAVENOUS | Status: DC | PRN

## 2019-09-14 MED ORDER — EPHEDRINE 5 MG/ML INJ
INTRAVENOUS | Status: AC
  Filled 2019-09-14: qty 10

## 2019-09-14 MED ORDER — PROPOFOL 500 MG/50ML IV EMUL
INTRAVENOUS | Status: DC | PRN
  Administered 2019-09-14: 50 ug/kg/min via INTRAVENOUS

## 2019-09-14 MED ORDER — PHENYLEPHRINE 40 MCG/ML (10ML) SYRINGE FOR IV PUSH (FOR BLOOD PRESSURE SUPPORT)
PREFILLED_SYRINGE | INTRAVENOUS | Status: AC
  Filled 2019-09-14: qty 10

## 2019-09-14 MED ORDER — ONDANSETRON HCL 4 MG/2ML IJ SOLN
INTRAMUSCULAR | Status: AC
  Filled 2019-09-14: qty 2

## 2019-09-14 MED ORDER — VANCOMYCIN HCL 1000 MG IV SOLR
INTRAVENOUS | Status: DC | PRN
  Administered 2019-09-14: 1000 mg via TOPICAL

## 2019-09-14 MED ORDER — PHENYLEPHRINE 40 MCG/ML (10ML) SYRINGE FOR IV PUSH (FOR BLOOD PRESSURE SUPPORT)
PREFILLED_SYRINGE | INTRAVENOUS | Status: DC | PRN
  Administered 2019-09-14 (×2): 120 ug via INTRAVENOUS

## 2019-09-14 MED ORDER — BUPIVACAINE HCL (PF) 0.5 % IJ SOLN
INTRAMUSCULAR | Status: AC
  Filled 2019-09-14: qty 30

## 2019-09-14 MED ORDER — LIDOCAINE HCL (CARDIAC) PF 100 MG/5ML IV SOSY
PREFILLED_SYRINGE | INTRAVENOUS | Status: DC | PRN
  Administered 2019-09-14: 50 mg via INTRATRACHEAL

## 2019-09-14 MED ORDER — PROPOFOL 1000 MG/100ML IV EMUL
INTRAVENOUS | Status: AC
  Filled 2019-09-14: qty 100

## 2019-09-14 MED ORDER — FENTANYL CITRATE (PF) 100 MCG/2ML IJ SOLN: 25.0000 ug | INTRAMUSCULAR | Status: DC | PRN

## 2019-09-14 MED ORDER — ONDANSETRON HCL 4 MG/2ML IJ SOLN
INTRAMUSCULAR | Status: DC | PRN
  Administered 2019-09-14: 4 mg via INTRAVENOUS

## 2019-09-14 MED ORDER — LIDOCAINE 2% (20 MG/ML) 5 ML SYRINGE
INTRAMUSCULAR | Status: AC
  Filled 2019-09-14: qty 5

## 2019-09-14 MED ORDER — MUPIROCIN 2 % EX OINT
1.0000 "application " | TOPICAL_OINTMENT | Freq: Two times a day (BID) | CUTANEOUS | Status: DC
  Administered 2019-09-14 – 2019-09-16 (×5): 1 via NASAL
  Filled 2019-09-14: qty 22

## 2019-09-14 MED ORDER — PROPOFOL 500 MG/50ML IV EMUL
INTRAVENOUS | Status: DC | PRN
  Administered 2019-09-14: 20 mg via INTRAVENOUS

## 2019-09-14 SURGICAL SUPPLY — 38 items
BLADE SURG 15 STRL LF DISP TIS (BLADE) ×1 IMPLANT
BLADE SURG 15 STRL SS (BLADE) ×3
BNDG CMPR MED 10X6 ELC LF (GAUZE/BANDAGES/DRESSINGS) ×1
BNDG ELASTIC 4X5.8 VLCR STR LF (GAUZE/BANDAGES/DRESSINGS) ×3 IMPLANT
BNDG ELASTIC 6X10 VLCR STRL LF (GAUZE/BANDAGES/DRESSINGS) ×2 IMPLANT
BNDG ELASTIC 6X5.8 VLCR STR LF (GAUZE/BANDAGES/DRESSINGS) ×3 IMPLANT
CLEANER TIP ELECTROSURG 2X2 (MISCELLANEOUS) ×1 IMPLANT
CNTNR URN SCR LID CUP LEK RST (MISCELLANEOUS) ×1 IMPLANT
CONT SPEC 4OZ STRL OR WHT (MISCELLANEOUS) ×3
COVER MAYO STAND STRL (DRAPES) ×1 IMPLANT
COVER SURGICAL LIGHT HANDLE (MISCELLANEOUS) ×3 IMPLANT
COVER WAND RF STERILE (DRAPES) IMPLANT
CUFF TOURN SGL QUICK 24 (TOURNIQUET CUFF) ×3
CUFF TRNQT CYL 24X4X16.5-23 (TOURNIQUET CUFF) ×1 IMPLANT
DECANTER SPIKE VIAL GLASS SM (MISCELLANEOUS) ×3 IMPLANT
GAUZE SPONGE 4X4 12PLY STRL (GAUZE/BANDAGES/DRESSINGS) ×5 IMPLANT
GAUZE XEROFORM 1X8 LF (GAUZE/BANDAGES/DRESSINGS) ×2 IMPLANT
GLOVE BIO SURGEON STRL SZ7.5 (GLOVE) ×3 IMPLANT
GLOVE BIOGEL PI IND STRL 8 (GLOVE) ×1 IMPLANT
GLOVE BIOGEL PI INDICATOR 8 (GLOVE) ×2
GOWN STRL REUS W/TWL XL LVL3 (GOWN DISPOSABLE) ×3 IMPLANT
KIT BASIN (CUSTOM PROCEDURE TRAY) ×3 IMPLANT
KIT TURNOVER KIT A (KITS) IMPLANT
MARKER SKIN DUAL TIP RULER LAB (MISCELLANEOUS) ×1 IMPLANT
NDL HYPO 25X1 1.5 SAFETY (NEEDLE) ×1 IMPLANT
NEEDLE HYPO 25X1 1.5 SAFETY (NEEDLE) ×3 IMPLANT
PACK ORTHO EXTREMITY (CUSTOM PROCEDURE TRAY) ×3 IMPLANT
PADDING UNDERCAST 2 STRL (CAST SUPPLIES) ×2
PADDING UNDERCAST 2X4 STRL (CAST SUPPLIES) ×1 IMPLANT
PENCIL SMOKE EVACUATOR (MISCELLANEOUS) IMPLANT
SUT PROLENE 4 0 PS 2 18 (SUTURE) ×1 IMPLANT
SUT VIC AB 1 CT1 27 (SUTURE)
SUT VIC AB 1 CT1 27XBRD ANTBC (SUTURE) ×1 IMPLANT
SYR 20ML LL LF (SYRINGE) ×3 IMPLANT
TOWEL OR 17X26 10 PK STRL BLUE (TOWEL DISPOSABLE) ×3 IMPLANT
TOWEL OR NON WOVEN STRL DISP B (DISPOSABLE) ×1 IMPLANT
TRAY PREP A LATEX SAFE STRL (SET/KITS/TRAYS/PACK) ×1 IMPLANT
UNDERPAD 30X36 HEAVY ABSORB (UNDERPADS AND DIAPERS) ×6 IMPLANT

## 2019-09-14 NOTE — Op Note (Signed)
Patient Name: Zachary [name] DOB: 05/20/1945  MRN: [id number]   Date of Service: 09/14/19  Surgeon: Dr. Hardie Pulley, [name] Assistants: None Pre-operative Diagnosis:  Gangrene of toe Post-operative Diagnosis:  same Procedures:  1) Amputation right 2nd toe Pathology/Specimens: ID Type Source Tests Collected by Time Destination  1 : second right toe Tissue PATH Digit amputation SURGICAL PATHOLOGY [name], [name] 09/14/2019 0802   A : aerobic anaerobic culture second right toe Wound Wound AEROBIC/ANAEROBIC CULTURE (SURGICAL/DEEP WOUND) [name], [name] 09/14/2019 0749    Anesthesia: MAC/local with 10 ccs 0.5% marcaine plain Hemostasis: * No tourniquets in log * Estimated Blood Loss: 5 mL Materials: * No implants in log * Medications: 1g vancomycin powder topical Complications: none  Indications for Procedure:  This is a 74 y.o. male with gangrene of the right 2nd toe with surrounding infection. Amputation indicated for resolution of infection.   Procedure in Detail: Patient was identified in pre-operative holding area. Formal consent was signed and the right lower extremity was marked. Patient was brought back to the operating room. Anesthesia was induced. The extremity was prepped and draped in the usual sterile fashion. Timeout was taken to confirm patient name, laterality, and procedure prior to incision.    Attention was then directed to the right 2nd toe. There was pressure necrosis with gangrene. Thre was also early pressure injury to the hallux laterally. An incision was made just distal to the 2nd MPJ. Dissection was carried down to level of bone.  Dissection was continued to the MPJ joint and all collateral ligaments were freed at the joint.  The bone soft tissue attachments of the proximal phalanx were removed and passed for pathology.  The remaining metatarsal head appeared healthy and viable. The wound margins did bleed nicely but this was slightly delayed. The area was  copiously irrigated. Vancomycin powder was applied topically. The skin was reapproximated with nylon and skin staples.  The foot was then dressed with xeroform, 4x4, kerlix, and ACE. Patient tolerated the procedure well.   Disposition: Following a period of post-operative monitoring, patient will be transferred back to the floor. The wound margin did appear healthy and viable but with suboptimal bleeding. He may benefit from vascular studies though he does have palpable pedal pulses. He may benefit from further surgical resection if the wound does not have sufficient bleeding for healing. We will monitor the hallux pressure injury for signs of worsening as well.

## 2019-09-14 NOTE — Progress Notes (Signed)
Received from PACU alert and oriented, skin warm and dry.   No complaints voiced.  Dressing dry and intact.

## 2019-09-14 NOTE — Progress Notes (Signed)
PROGRESS NOTE  Zachary [name] SNK:[id number] DOB: [birth year] DOA: 09/13/2019 PCP: [name], MD   LOS: 1 day   Brief narrative: As per HPI,   Zachary [name] is a 74 y.o. male with medical history significant of diabetes, hyperlipidemia, hypertension and peripheral vascular disease who presentED to the hospital with right second toe ulceration infection and purulence from podiatry office sent by [name].  Patient has been caring for his wife who recently had a stroke and has been essentially ignoring his own foot infection for at least a few weeks per his history, [name] was attempting to salvage with antibiotics which failed in the outpatient setting now recommending amputation. In  the ED, without leukocytosis fever or any sepsis criteria, mild AKI with creatinine at 1.5 but otherwise patient feels quite well other than painful right second toe. Dr March Rummage recommending ceftriaxone and vancomycin and amputation of the toe.  Hospitalist called to admit.  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  On vancomycin and Rocephin.  [name] podiatry on board.  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  Hold aspirin and Plavix for now.  Diabetes mellitus type II.  Continue sliding scale insulin Accu-Cheks diabetic diet.  Patient is on acarbose and farxigxa at home.   Essential hypertension.  On Cardizem at home.  Currently NPO.  Will resume.  Hyperlipidemia.  On Zetia at home.  VTE Prophylaxis: Has been started on heparin subcu  Code Status: Full code  Family Communication: Spoke with the patient's wife and son at bedside.  Status is: Inpatient  Remains inpatient appropriate because:IV treatments appropriate due to intensity of illness or inability to take PO, Inpatient level of care appropriate due to severity of illness  and Osteomyelitis gangrene requiring IV antibiotic, amputation.  Dispo: The patient is from: Home              Anticipated d/c is to: Home              Anticipated d/c date is: 3 days              Patient currently is not medically stable to d/c.    Consultants:  [name] podiatry  Procedures:  Right second toe amputation  Antibiotics:  . Vancomycin Rocephin  Anti-infectives (From admission, onward)   Start     Dose/Rate Route Frequency Ordered Stop   09/14/19 1000  cefTRIAXone (ROCEPHIN) 2 g in sodium chloride 0.9 % 100 mL IVPB     2 g 200 mL/hr over 30 Minutes Intravenous Every 24 hours 09/13/19 1307 09/17/19 0959   09/14/19 1000  vancomycin (VANCOREADY) IVPB 1500 mg/300 mL     1,500 mg 150 mL/hr over 120 Minutes Intravenous Every 24 hours 09/13/19 1323     09/13/19 1330  cefTRIAXone (ROCEPHIN) 1 g in sodium chloride 0.9 % 100 mL IVPB     1 g 200 mL/hr over 30 Minutes Intravenous NOW 09/13/19 1309 09/13/19 1658   09/13/19 1315  vancomycin (VANCOREADY) IVPB 1500 mg/300 mL     1,500 mg 150 mL/hr over 120 Minutes Intravenous  Once 09/13/19 1312 09/13/19 1658   09/13/19 1245  vancomycin (VANCOCIN) IVPB 1000 mg/200 mL premix  Status:  Discontinued     1,000 mg 200 mL/hr over 60 Minutes Intravenous STAT 09/13/19 1230 09/13/19 1311   09/13/19 1215  cefTRIAXone (  ROCEPHIN) 1 g in sodium chloride 0.9 % 100 mL IVPB     1 g 200 mL/hr over 30 Minutes Intravenous  Once 09/13/19 1209 09/13/19 1319     Subjective: Today, patient was seen and examined at bedside.  Denies any overt pain at this time.  Seen after surgery.  Denies shortness of breath cough fever.  Objective: Vitals:   09/13/19 2123 09/14/19 0531  BP: (!) 174/96 (!) 162/78  Pulse: 76 68  Resp: 15 16  Temp: 98.3 F (36.8 C) 98 F (36.7 C)  SpO2: 98% 98%    Intake/Output Summary (Last 24 hours) at 09/14/2019 0726 Last data filed at 09/14/2019 0531 Gross per 24 hour  Intake --  Output 0 ml  Net 0 ml   Filed  Weights   09/13/19 1245  Weight: 86.2 kg   Body mass index is 28.89 kg/m.   Physical Exam: GENERAL: Patient is alert awake and oriented. Not in obvious distress. HENT: No scleral pallor or icterus. Pupils equally reactive to light. Oral mucosa is moist NECK: is supple, no gross swelling noted. CHEST: Clear to auscultation. No crackles or wheezes.  Diminished breath sounds bilaterally. CVS: S1 and S2 heard, no murmur. Regular rate and rhythm.  ABDOMEN: Soft, non-tender, bowel sounds are present. EXTREMITIES: Right foot with the dressing.  Status post right second toe amputation. CNS: Cranial nerves are intact. No focal motor deficits. SKIN: warm and dry, right foot in a dressing.  Data Review: I have personally reviewed the following laboratory data and studies,  CBC: Recent Labs  Lab 09/13/19 1154 09/14/19 0329  WBC 10.2 6.2  NEUTROABS 8.3*  --   HGB 16.9 14.4  HCT 49.2 43.1  MCV 87.2 88.5  PLT 232 419   Basic Metabolic Panel: Recent Labs  Lab 09/13/19 1154 09/14/19 0329  NA 141 140  K 4.4 4.0  CL 103 102  CO2 27 29  GLUCOSE 142* 95  BUN 19 22  CREATININE 1.49* 1.42*  CALCIUM 9.2 8.7*   Liver Function Tests: Recent Labs  Lab 09/14/19 0329  AST 14*  ALT 15  ALKPHOS 58  BILITOT 0.5  PROT 6.9  ALBUMIN 3.4*   No results for input(s): LIPASE, AMYLASE in the last 168 hours. No results for input(s): AMMONIA in the last 168 hours. Cardiac Enzymes: No results for input(s): CKTOTAL, CKMB, CKMBINDEX, TROPONINI in the last 168 hours. BNP (last 3 results) No results for input(s): BNP in the last 8760 hours.  ProBNP (last 3 results) No results for input(s): PROBNP in the last 8760 hours.  CBG: Recent Labs  Lab 09/13/19 1708 09/13/19 2125 09/14/19 0533  GLUCAP 149* 107* 102*   Recent Results (from the past 240 hour(s))  SARS Coronavirus 2 by RT PCR (hospital order, performed in [facility] hospital lab) Nasopharyngeal Nasopharyngeal Swab     Status: None     Collection Time: 09/13/19 11:54 AM   Specimen: Nasopharyngeal Swab  Result Value Ref Range Status   SARS Coronavirus 2 NEGATIVE NEGATIVE Final    Comment: (NOTE) SARS-CoV-2 target nucleic acids are NOT DETECTED. The SARS-CoV-2 RNA is generally detectable in upper and lower respiratory specimens during the acute phase of infection. The lowest concentration of SARS-CoV-2 viral copies this assay can detect is 250 copies / mL. A negative result does not preclude SARS-CoV-2 infection and should not be used as the sole basis for treatment or other patient management decisions.  A negative result may occur with improper specimen collection /  handling, submission of specimen other than nasopharyngeal swab, presence of viral mutation(s) within the areas targeted by this assay, and inadequate number of viral copies (<250 copies / mL). A negative result must be combined with clinical observations, patient history, and epidemiological information. Fact Sheet for Patients:   StrictlyIdeas.no Fact Sheet for Healthcare Providers: BankingDealers.co.za This test is not yet approved or cleared  by the [place] FDA and has been authorized for detection and/or diagnosis of SARS-CoV-2 by FDA under an Emergency Use Authorization (EUA).  This EUA will remain in effect (meaning this test can be used) for the duration of the COVID-19 declaration under Section 564(b)(1) of the Act, 21 U.S.C. section 360bbb-3(b)(1), unless the authorization is terminated or revoked sooner. Performed at [facility], [place] [address], [place], [place] [address]   Blood culture (routine x 2)     Status: None (Preliminary result)   Collection Time: 09/13/19 12:10 PM   Specimen: BLOOD  Result Value Ref Range Status   Specimen Description   Final    BLOOD BLOOD RIGHT FOREARM Performed at [facility] [address] [place], [place]  [address]    Special Requests   Final    BOTTLES DRAWN AEROBIC AND ANAEROBIC Blood Culture results may not be optimal due to an inadequate volume of blood received in culture bottles Performed at [facility] [address] [place], [place] [address]    Culture   Final    NO GROWTH < 24 HOURS Performed at [facility] [address] [place], [place] [address]    Report Status PENDING  Incomplete  Blood culture (routine x 2)     Status: None (Preliminary result)   Collection Time: 09/13/19 12:15 PM   Specimen: BLOOD  Result Value Ref Range Status   Specimen Description   Final    BLOOD BLOOD LEFT FOREARM Performed at [facility] [address], [place], [place] [address]    Special Requests   Final    BOTTLES DRAWN AEROBIC AND ANAEROBIC Blood Culture adequate volume Performed at [facility] [address], [place], [place] [address]    Culture   Final    NO GROWTH < 24 HOURS Performed at [facility] [address], [place], [place] [address]    Report Status PENDING  Incomplete     Studies: No results found.   Flora Lipps, MD  [facility] 09/14/2019

## 2019-09-14 NOTE — Plan of Care (Signed)
  Problem: Education: Goal: Knowledge of General Education information will improve Description: Including pain rating scale, medication(s)/side effects and non-pharmacologic comfort measures Outcome: Progressing   Problem: Health Behavior/Discharge Planning: Goal: Ability to manage health-related needs will improve Outcome: Progressing   Problem: Clinical Measurements: Goal: Ability to maintain clinical measurements within normal limits will improve Outcome: Progressing Goal: Respiratory complications will improve Outcome: Progressing Goal: Cardiovascular complication will be avoided Outcome: Progressing   Problem: Nutrition: Goal: Adequate nutrition will be maintained Outcome: Progressing   Problem: Skin Integrity: Goal: Risk for impaired skin integrity will decrease Outcome: Progressing

## 2019-09-14 NOTE — Anesthesia Preprocedure Evaluation (Signed)
Anesthesia Evaluation  Patient identified by MRN, date of birth, ID band Patient awake    Reviewed: Allergy & Precautions, NPO status , Patient's Chart, lab work & pertinent test results  Airway Mallampati: II  TM Distance: >3 FB Neck ROM: Full    Dental  (+) Dental Advisory Given   Pulmonary neg pulmonary ROS,    breath sounds clear to auscultation       Cardiovascular hypertension, Pt. on medications + Peripheral Vascular Disease   Rhythm:Regular Rate:Normal     Neuro/Psych negative neurological ROS     GI/Hepatic negative GI ROS, Neg liver ROS,   Endo/Other  diabetes, Type 2, Insulin Dependent  Renal/GU CRFRenal disease     Musculoskeletal   Abdominal   Peds  Hematology negative hematology ROS (+)   Anesthesia Other Findings   Reproductive/Obstetrics                             Anesthesia Physical Anesthesia Plan  ASA: III  Anesthesia Plan: MAC   Post-op Pain Management:    Induction:   PONV Risk Score and Plan: 1 and Propofol infusion, Ondansetron and Treatment may vary due to age or medical condition  Airway Management Planned: Natural Airway and Simple Face Mask  Additional Equipment: None  Intra-op Plan:   Post-operative Plan:   Informed Consent: I have reviewed the patients History and Physical, chart, labs and discussed the procedure including the risks, benefits and alternatives for the proposed anesthesia with the patient or authorized representative who has indicated his/her understanding and acceptance.       Plan Discussed with: CRNA  Anesthesia Plan Comments:         Anesthesia Quick Evaluation

## 2019-09-14 NOTE — Transfer of Care (Signed)
Immediate Anesthesia Transfer of Care Note  Patient: Zachary [name]  Procedure(s) Performed: AMPUTATION TOE RIGHT SECOND TOE (Right Toe)  Patient Location: PACU  Anesthesia Type:MAC  Level of Consciousness: awake, oriented, patient cooperative and responds to stimulation  Airway & Oxygen Therapy: Patient Spontanous Breathing and Patient connected to face mask oxygen  Post-op Assessment: Report given to RN and Post -op Vital signs reviewed and stable  Post vital signs: Reviewed and stable  Last Vitals:  Vitals Value Taken Time  BP    Temp    Pulse 61 09/14/19 0814  Resp 17 09/14/19 0814  SpO2 100 % 09/14/19 0814  Vitals shown include unvalidated device data.  Last Pain:  Vitals:   09/13/19 2233  TempSrc:   PainSc: Asleep      Patients Stated Pain Goal: 3 (85/02/77 4128)  Complications: No apparent anesthesia complications

## 2019-09-14 NOTE — Anesthesia Postprocedure Evaluation (Signed)
Anesthesia Post Note  Patient: Zachary [name]  Procedure(s) Performed: AMPUTATION TOE RIGHT SECOND TOE (Right Toe)     Patient location during evaluation: PACU Anesthesia Type: MAC Level of consciousness: awake and alert Pain management: pain level controlled Vital Signs Assessment: post-procedure vital signs reviewed and stable Respiratory status: spontaneous breathing, nonlabored ventilation, respiratory function stable and patient connected to nasal cannula oxygen Cardiovascular status: stable and blood pressure returned to baseline Postop Assessment: no apparent nausea or vomiting Anesthetic complications: no    Last Vitals:  Vitals:   09/14/19 1021 09/14/19 1107  BP: 125/63 126/60  Pulse: 73 67  Resp: 16 16  Temp: 36.6 C 36.5 C  SpO2: 98% 99%    Last Pain:  Vitals:   09/14/19 1107  TempSrc: Oral  PainSc:                  Tiajuana Amass

## 2019-09-14 NOTE — Interval H&P Note (Signed)
History and Physical Interval Note:  09/14/2019 7:32 AM  Zachary [name]  has presented today for surgery, with the diagnosis of Right foot second toe gangrene.  The various methods of treatment have been discussed with the patient and family. After consideration of risks, benefits and other options for treatment, the patient has consented to  Procedure(s): AMPUTATION TOE RIGHT SECOND TOE (Right) as a surgical intervention.  The patient's history has been reviewed, patient examined, no change in status, stable for surgery.  I have reviewed the patient's chart and labs.  Questions were answered to the patient's satisfaction.     [name]

## 2019-09-15 LAB — GLUCOSE, CAPILLARY
Glucose-Capillary: 139 mg/dL — ABNORMAL HIGH (ref 70–99)
Glucose-Capillary: 165 mg/dL — ABNORMAL HIGH (ref 70–99)
Glucose-Capillary: 88 mg/dL (ref 70–99)
Glucose-Capillary: 133 mg/dL — ABNORMAL HIGH (ref 70–99)

## 2019-09-15 LAB — CBC
HCT: 41.6 % (ref 39.0–52.0)
Hemoglobin: 14.1 g/dL (ref 13.0–17.0)
MCH: 29.7 pg (ref 26.0–34.0)
MCHC: 33.9 g/dL (ref 30.0–36.0)
MCV: 87.8 fL (ref 80.0–100.0)
Platelets: 192 10*3/uL (ref 150–400)
RBC: 4.74 MIL/uL (ref 4.22–5.81)
RDW: 11.9 % (ref 11.5–15.5)
WBC: 7.4 10*3/uL (ref 4.0–10.5)
nRBC: 0 % (ref 0.0–0.2)

## 2019-09-15 LAB — BASIC METABOLIC PANEL
Anion gap: 9 (ref 5–15)
BUN: 19 mg/dL (ref 8–23)
CO2: 26 mmol/L (ref 22–32)
Calcium: 8.3 mg/dL — ABNORMAL LOW (ref 8.9–10.3)
Chloride: 101 mmol/L (ref 98–111)
Creatinine, Ser: 1.52 mg/dL — ABNORMAL HIGH (ref 0.61–1.24)
GFR calc Af Amer: 51 mL/min — ABNORMAL LOW (ref >60)
GFR calc non Af Amer: 44 mL/min — ABNORMAL LOW (ref >60)
Glucose, Bld: 157 mg/dL — ABNORMAL HIGH (ref 70–99)
Potassium: 4.3 mmol/L (ref 3.5–5.1)
Sodium: 136 mmol/L (ref 135–145)

## 2019-09-15 LAB — MAGNESIUM: Magnesium: 2 mg/dL (ref 1.7–2.4)

## 2019-09-15 MED ORDER — INSULIN GLARGINE-LIXISENATIDE 100-33 UNT-MCG/ML ~~LOC~~ SOPN
60.0000 [IU] | PEN_INJECTOR | Freq: Every day | SUBCUTANEOUS | Status: DC
  Administered 2019-09-15 – 2019-09-16 (×2): 60 [IU] via SUBCUTANEOUS

## 2019-09-15 MED ORDER — POLYETHYLENE GLYCOL 3350 17 G PO PACK
17.0000 g | PACK | Freq: Every day | ORAL | Status: DC
  Administered 2019-09-15 – 2019-09-16 (×2): 17 g via ORAL
  Filled 2019-09-15 (×2): qty 1

## 2019-09-15 MED ORDER — DILTIAZEM HCL ER 120 MG PO CP24
120.0000 mg | ORAL_CAPSULE | Freq: Every day | ORAL | Status: DC
  Administered 2019-09-15: 120 mg via ORAL
  Filled 2019-09-15 (×3): qty 1

## 2019-09-15 NOTE — Progress Notes (Signed)
Called [facility] physician on call regarding weight bearing status.  New Orders given.

## 2019-09-15 NOTE — Evaluation (Signed)
Physical Therapy Evaluation Patient Details Name: Zachary [name] MRN: [id number] DOB: Apr 02, 1946 Today's Date: 09/15/2019   History of Present Illness  Pt s/p R foot D2 amputation.  Pt with hx of PVD, DM, L patellar fx 10/20 and previous toe amputations on L foot ~20 yrs ago  Clinical Impression  Pt admitted as above and presenting with functional mobility limitations 2* post op pain and ambulatory balance deficits associated with limited WB (heel only) on R foot.  Pt should progress to dc home with family assist.    Follow Up Recommendations No PT follow up    Equipment Recommendations  None recommended by PT    Recommendations for Other Services       Precautions / Restrictions Precautions Precautions: Fall Required Braces or Orthoses: Other Brace Other Brace: Pt states Dr office provided Cam boot prior to admit - boot is in room Restrictions Weight Bearing Restrictions: Yes Other Position/Activity Restrictions: PWB - weight on heel only      Mobility  Bed Mobility Overal bed mobility: Modified Independent             General bed mobility comments: pt unassisted sit to supine  Transfers Overall transfer level: Needs assistance Equipment used: Rolling walker (2 wheeled) Transfers: Sit to/from Stand Sit to Stand: Min guard;Supervision         General transfer comment: min cues for use of UEs to self assist  Ambulation/Gait Ambulation/Gait assistance: Min guard;Supervision Gait Distance (Feet): 150 Feet Assistive device: Rolling walker (2 wheeled);Standard walker Gait Pattern/deviations: Step-to pattern;Decreased step length - right;Decreased step length - left;Shuffle Gait velocity: decr   General Gait Details: min cues for posture, positon from RW and to limit step through to keep wt off toes  Stairs Stairs: Yes Stairs assistance: Min guard Stair Management: Two rails;Forwards;Step to pattern Number of Stairs: 2 General stair comments: min cues for  sequence  Wheelchair Mobility    Modified Rankin (Stroke Patients Only)       Balance Overall balance assessment: Mild deficits observed, not formally tested                                           Pertinent Vitals/Pain Pain Assessment: 0-10 Pain Score: 3  Pain Location: R foot Pain Descriptors / Indicators: Aching;Sore Pain Intervention(s): Limited activity within patient's tolerance;Monitored during session;Premedicated before session    [place] expects to be discharged to:: Private residence Living Arrangements: Spouse/significant other Available Help at Discharge: Family Type of Home: House Home Access: Stairs to enter Entrance Stairs-Rails: Right;Left;Can reach both Technical brewer of Steps: 2 Home Layout: One level Home Equipment: Crutches;Walker - standard Additional Comments: Spouse is limited in ability to assist - has had CVA    Prior Function Level of Independence: Independent               Hand Dominance        Extremity/Trunk Assessment   Upper Extremity Assessment Upper Extremity Assessment: Overall WFL for tasks assessed    Lower Extremity Assessment Lower Extremity Assessment: RLE deficits/detail RLE Deficits / Details: Dressings in place R foot    Cervical / Trunk Assessment Cervical / Trunk Assessment: Normal  Communication   Communication: No difficulties  Cognition Arousal/Alertness: Awake/alert Behavior During Therapy: WFL for tasks assessed/performed Overall Cognitive Status: Within Functional Limits for tasks assessed  General Comments      Exercises     Assessment/Plan    PT Assessment Patient needs continued PT services  PT Problem List Decreased balance;Decreased knowledge of use of DME;Pain       PT Treatment Interventions DME instruction;Gait training;Stair training;Functional mobility training;Therapeutic  activities;Therapeutic exercise;Patient/family education    PT Goals (Current goals can be found in the Care Plan section)  Acute Rehab PT Goals Patient Stated Goal: Regain IND PT Goal Formulation: With patient Time For Goal Achievement: 09/29/19 Potential to Achieve Goals: Good    Frequency Min 3X/week   Barriers to discharge        Co-evaluation               AM-PAC PT "6 Clicks" Mobility  Outcome Measure Help needed turning from your back to your side while in a flat bed without using bedrails?: None Help needed moving from lying on your back to sitting on the side of a flat bed without using bedrails?: None Help needed moving to and from a bed to a chair (including a wheelchair)?: A Little Help needed standing up from a chair using your arms (e.g., wheelchair or bedside chair)?: A Little Help needed to walk in hospital room?: A Little Help needed climbing 3-5 steps with a railing? : A Little 6 Click Score: 20    End of Session Equipment Utilized During Treatment: Gait belt Activity Tolerance: Patient tolerated treatment well Patient left: in bed;with call bell/phone within reach;with family/visitor present Nurse Communication: Mobility status PT Visit Diagnosis: Unsteadiness on feet (R26.81);Difficulty in walking, not elsewhere classified (R26.2);Pain Pain - Right/Left: Right Pain - part of body: Ankle and joints of foot    Time: 1000-1017 PT Time Calculation (min) (ACUTE ONLY): 17 min   Charges:   PT Evaluation $PT Eval Low Complexity: 1 Low         [place] Pager [phone] Office [phone]   Kenni Newton 09/15/2019, 12:29 PM

## 2019-09-15 NOTE — Progress Notes (Signed)
PROGRESS NOTE  AWAB ABEBE WPY:[id number] DOB: [birth year] DOA: 09/13/2019 PCP: [name], MD   LOS: 2 days   Brief narrative: As per HPI,   Zachary [name] is a 74 y.o. male with medical history significant of diabetes, hyperlipidemia, hypertension and peripheral vascular disease who presentED to the hospital with right second toe ulceration infection and purulence from podiatry office sent by [name].  Patient has been caring for his wife who recently had a stroke and has been essentially ignoring his own foot infection for at least a few weeks per his history, [name] was attempting to salvage with antibiotics which failed in the outpatient setting now recommending amputation. In  the ED, without leukocytosis fever or any sepsis criteria, mild AKI with creatinine at 1.5 but otherwise patient feels quite well other than painful right second toe. Dr March Rummage recommended ceftriaxone and vancomycin and amputation of the toe.  Hospitalist called to admit.  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  On vancomycin and Rocephin.  [name] podiatry on board.  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  Tissue culture from surgical intervention showing moderate gram-positive cocci in pairs and few gram-positive rods.  Blood cultures negative in less than 24 hours.  Diabetes mellitus type II.  Continue sliding scale insulin Accu-Cheks diabetic diet.  On insulin at home.  Will resume.  POC glucose of 139.  Essential hypertension.  On Cardizem at home.  Will resume today.  Hyperlipidemia.  Will monitor  VTE Prophylaxis:  heparin subcu  Code Status: Full code  Family Communication:   None today.  Spoke with the patient's wife and son at bedside  yesterday..  Status is: Inpatient  Remains inpatient appropriate because:IV treatments appropriate due to intensity of illness or inability to take PO, Inpatient level of care appropriate due to severity of illness and Osteomyelitis gangrene requiring IV antibiotic, amputation.  Podiatry follow-up  Dispo: The patient is from: Home              Anticipated d/c is to: Home              Anticipated d/c date is 1 to 2 days pending podiatry input,              Patient currently is not medically stable to d/c.    Consultants:  [name] podiatry  Procedures:  Right second toe amputation 09/14/2019 by [name] podiatry  Antibiotics:  . Vancomycin, Rocephin IV  Anti-infectives (From admission, onward)   Start     Dose/Rate Route Frequency Ordered Stop   09/14/19 1000  cefTRIAXone (ROCEPHIN) 2 g in sodium chloride 0.9 % 100 mL IVPB     2 g 200 mL/hr over 30 Minutes Intravenous Every 24 hours 09/13/19 1307 09/17/19 0959   09/14/19 1000  vancomycin (VANCOREADY) IVPB 1500 mg/300 mL     1,500 mg 150 mL/hr over 120 Minutes Intravenous Every 24 hours 09/13/19 1323     09/14/19 0801  vancomycin (VANCOCIN) powder  Status:  Discontinued       As needed 09/14/19 0802 09/14/19 0854   09/13/19 1330  cefTRIAXone (ROCEPHIN) 1 g in sodium chloride 0.9 % 100 mL IVPB     1 g 200 mL/hr over  30 Minutes Intravenous NOW 09/13/19 1309 09/13/19 1658   09/13/19 1315  vancomycin (VANCOREADY) IVPB 1500 mg/300 mL     1,500 mg 150 mL/hr over 120 Minutes Intravenous  Once 09/13/19 1312 09/13/19 1658   09/13/19 1245  vancomycin (VANCOCIN) IVPB 1000 mg/200 mL premix  Status:  Discontinued     1,000 mg 200 mL/hr over 60 Minutes Intravenous STAT 09/13/19 1230 09/13/19 1311   09/13/19 1215  cefTRIAXone (ROCEPHIN) 1 g in sodium chloride 0.9 % 100 mL IVPB     1 g 200 mL/hr over 30 Minutes Intravenous  Once 09/13/19 1209 09/13/19 1319     Subjective: Today, patient was seen and examined at bedside.  Denies any  pain, nausea vomiting, shortness of breath or fever.   Objective: Vitals:   09/15/19 0128 09/15/19 0534  BP: (!) 130/59 139/64  Pulse: 87 81  Resp: 15 17  Temp: 98.6 F (37 C) 98.7 F (37.1 C)  SpO2: 96% 97%    Intake/Output Summary (Last 24 hours) at 09/15/2019 0713 Last data filed at 09/15/2019 0534 Gross per 24 hour  Intake 1399.87 ml  Output 805 ml  Net 594.87 ml   Filed Weights   09/13/19 1245  Weight: 86.2 kg   Body mass index is 28.89 kg/m.   Physical Exam: GENERAL: Patient is alert awake and oriented. Not in obvious distress. HENT: No scleral pallor or icterus. Pupils equally reactive to light. Oral mucosa is moist NECK: is supple, no gross swelling noted. CHEST: Clear to auscultation. No crackles or wheezes.  Diminished breath sounds bilaterally. CVS: S1 and S2 heard, no murmur. Regular rate and rhythm.  ABDOMEN: Soft, non-tender, bowel sounds are present. EXTREMITIES: Right foot with the dressing.  Status post right second toe amputation. CNS: Cranial nerves are intact. No focal motor deficits. SKIN: warm and dry, right foot in a dressing.  Data Review: I have personally reviewed the following laboratory data and studies,  CBC: Recent Labs  Lab 09/13/19 1154 09/14/19 0329 09/15/19 0357  WBC 10.2 6.2 7.4  NEUTROABS 8.3*  --   --   HGB 16.9 14.4 14.1  HCT 49.2 43.1 41.6  MCV 87.2 88.5 87.8  PLT 232 191 010   Basic Metabolic Panel: Recent Labs  Lab 09/13/19 1154 09/14/19 0329 09/15/19 0357  NA 141 140 136  K 4.4 4.0 4.3  CL 103 102 101  CO2 27 29 26   GLUCOSE 142* 95 157*  BUN 19 22 19   CREATININE 1.49* 1.42* 1.52*  CALCIUM 9.2 8.7* 8.3*  MG  --   --  2.0   Liver Function Tests: Recent Labs  Lab 09/14/19 0329  AST 14*  ALT 15  ALKPHOS 58  BILITOT 0.5  PROT 6.9  ALBUMIN 3.4*   No results for input(s): LIPASE, AMYLASE in the last 168 hours. No results for input(s): AMMONIA in the last 168 hours. Cardiac Enzymes: No results for  input(s): CKTOTAL, CKMB, CKMBINDEX, TROPONINI in the last 168 hours. BNP (last 3 results) No results for input(s): BNP in the last 8760 hours.  ProBNP (last 3 results) No results for input(s): PROBNP in the last 8760 hours.  CBG: Recent Labs  Lab 09/14/19 0533 09/14/19 0819 09/14/19 1214 09/14/19 1740 09/14/19 2155  GLUCAP 102* 106* 91 92 138*   Recent Results (from the past 240 hour(s))  SARS Coronavirus 2 by RT PCR (hospital order, performed in [facility] hospital lab) Nasopharyngeal Nasopharyngeal Swab     Status: None   Collection Time:  09/13/19 11:54 AM   Specimen: Nasopharyngeal Swab  Result Value Ref Range Status   SARS Coronavirus 2 NEGATIVE NEGATIVE Final    Comment: (NOTE) SARS-CoV-2 target nucleic acids are NOT DETECTED. The SARS-CoV-2 RNA is generally detectable in upper and lower respiratory specimens during the acute phase of infection. The lowest concentration of SARS-CoV-2 viral copies this assay can detect is 250 copies / mL. A negative result does not preclude SARS-CoV-2 infection and should not be used as the sole basis for treatment or other patient management decisions.  A negative result may occur with improper specimen collection / handling, submission of specimen other than nasopharyngeal swab, presence of viral mutation(s) within the areas targeted by this assay, and inadequate number of viral copies (<250 copies / mL). A negative result must be combined with clinical observations, patient history, and epidemiological information. Fact Sheet for Patients:   StrictlyIdeas.no Fact Sheet for Healthcare Providers: BankingDealers.co.za This test is not yet approved or cleared  by the [place] FDA and has been authorized for detection and/or diagnosis of SARS-CoV-2 by FDA under an Emergency Use Authorization (EUA).  This EUA will remain in effect (meaning this test can be used) for the duration of  the COVID-19 declaration under Section 564(b)(1) of the Act, 21 U.S.C. section 360bbb-3(b)(1), unless the authorization is terminated or revoked sooner. Performed at [facility], [place] [address], [place], [place] [address]   Blood culture (routine x 2)     Status: None (Preliminary result)   Collection Time: 09/13/19 12:10 PM   Specimen: BLOOD  Result Value Ref Range Status   Specimen Description   Final    BLOOD BLOOD RIGHT FOREARM Performed at [facility] [address] [place], [place] [address]    Special Requests   Final    BOTTLES DRAWN AEROBIC AND ANAEROBIC Blood Culture results may not be optimal due to an inadequate volume of blood received in culture bottles Performed at [facility] [address], [place], [place] [address]    Culture   Final    NO GROWTH < 24 HOURS Performed at [facility] [address] [place], [place] [address]    Report Status PENDING  Incomplete  Blood culture (routine x 2)     Status: None (Preliminary result)   Collection Time: 09/13/19 12:15 PM   Specimen: BLOOD  Result Value Ref Range Status   Specimen Description   Final    BLOOD BLOOD LEFT FOREARM Performed at [facility] [address], [place], [place] [address]    Special Requests   Final    BOTTLES DRAWN AEROBIC AND ANAEROBIC Blood Culture adequate volume Performed at [facility] [address] [place], [place] [address]    Culture   Final    NO GROWTH < 24 HOURS Performed at [facility] [address] [place], [place] [address]    Report Status PENDING  Incomplete  Surgical PCR screen     Status: None   Collection Time: 09/14/19  5:21 AM   Specimen: Nasal Mucosa; Nasal Swab  Result Value Ref Range Status   MRSA, PCR NEGATIVE NEGATIVE Final   Staphylococcus aureus NEGATIVE NEGATIVE Final    Comment: (NOTE) The Xpert SA Assay (FDA approved for NASAL  specimens in patients 54 years of age and older), is one component of a comprehensive surveillance program. It is not intended to diagnose infection nor to guide or monitor  treatment. Performed at [facility], [place] [address], [place], [place] [address]   Aerobic/Anaerobic Culture (surgical/deep wound)     Status: None (Preliminary result)   Collection Time: 09/14/19  7:49 AM   Specimen: Wound  Result Value Ref Range Status   Specimen Description   Final    WOUND Performed at [facility] [address] [place], [place] [address]    Special Requests   Final    NONE Performed at [facility], [place] [address], [place], [place] [address]    Gram Stain   Final    RARE WBC PRESENT, PREDOMINANTLY PMN MODERATE GRAM POSITIVE COCCI IN PAIRS IN CLUSTERS FEW GRAM POSITIVE RODS Performed at [facility], [place] [address], [place], [place] [address]    Culture PENDING  Incomplete   Report Status PENDING  Incomplete     Studies: DG Foot 2 Views Right  Result Date: 09/14/2019 CLINICAL DATA:  Post amputation second toe EXAM: RIGHT FOOT - 2 VIEW COMPARISON:  09/13/2019 FINDINGS: Amputation of RIGHT second toe with skin clips, soft tissue swelling and dressing artifacts at amputation bed. Low normal osseous mineralization. Degenerative changes at intertarsal joints and first MTP joint. No fracture, dislocation, or bone destruction. Bulky plantar and Achilles insertion calcaneal spurs. IMPRESSION: RIGHT second toe amputation. Scattered degenerative changes. No acute abnormalities. Electronically Signed   By: Lavonia Dana M.D.   On: 09/14/2019 14:12     Flora Lipps, MD  [facility] 09/15/2019

## 2019-09-15 NOTE — Progress Notes (Signed)
Podiatry postoperative progress note  Subjective: LEMARCUS [name] is a 74 y.o. male patient seen at bedside, resting comfortably in no acute distress s/p right second toe amputation for gangrene performed by [name].  Patient denies current pain at surgical site, denies calf pain, denies headache, chest pain, shortness of breath, nausea, vomitting, denies loss of appetite, denies problems with voiding. No other issues noted.   Patient Active Problem List   Diagnosis Date Noted  . Gangrenous toe ([place])   . Acute osteomyelitis ([place]) 09/13/2019  . Quadriceps tendon rupture, left, initial encounter 01/10/2019  . Closed comminuted fracture of left patella, initial encounter 01/08/2019  Was some excitement right  Current Facility-Administered Medications:  .  acetaminophen (TYLENOL) tablet 650 mg, 650 mg, Oral, Q6H PRN **OR** acetaminophen (TYLENOL) suppository 650 mg, 650 mg, Rectal, Q6H PRN, [name], [name] .  bisacodyl (DULCOLAX) EC tablet 5 mg, 5 mg, Oral, Daily PRN, [name], [name] .  cefTRIAXone (ROCEPHIN) 2 g in sodium chloride 0.9 % 100 mL IVPB, 2 g, Intravenous, Q24H, [name], [name], Stopped at 09/15/19 1201 .  diltiazem (DILACOR XR) 24 hr capsule 120 mg, 120 mg, Oral, Daily, Pokhrel, Laxman, MD, 120 mg at 09/15/19 1132 .  heparin injection 5,000 Units, 5,000 Units, Subcutaneous, Q8H, [name], [name], 5,000 Units at 09/15/19 0704 .  HYDROcodone-acetaminophen (NORCO/VICODIN) 5-325 MG per tablet 1-2 tablet, 1-2 tablet, Oral, Q4H PRN, [name], [name], 2 tablet at 09/15/19 1132 .  HYDROmorphone (DILAUDID) injection 0.5-1 mg, 0.5-1 mg, Intravenous, Q2H PRN, [name], [name], 1 mg at 09/15/19 1408 .  insulin aspart (novoLOG) injection 0-15 Units, 0-15 Units, Subcutaneous, TID WC, [name], [name], 3 Units at 09/15/19 1216 .  insulin aspart (novoLOG) injection 0-5 Units, 0-5 Units, Subcutaneous, QHS, Price, Christian Mate, [name] .  Insulin Glargine-Lixisenatide  100-33 UNT-MCG/ML SOPN 60 Units, 60 Units, Subcutaneous, Daily, Pokhrel, Laxman, MD, 60 Units at 09/15/19 1209 .  mupirocin ointment (BACTROBAN) 2 % 1 application, 1 application, Nasal, BID, [name], [name], 1 application at 29/93/71 1134 .  ondansetron (ZOFRAN) tablet 4 mg, 4 mg, Oral, Q6H PRN **OR** ondansetron (ZOFRAN) injection 4 mg, 4 mg, Intravenous, Q6H PRN, Price, Christian Mate, [name] .  polyethylene glycol (MIRALAX / GLYCOLAX) packet 17 g, 17 g, Oral, Daily, Pokhrel, Laxman, MD, 17 g at 09/15/19 1216 .  senna-docusate (Senokot-S) tablet 1 tablet, 1 tablet, Oral, QHS PRN, [name], [name] .  vancomycin (VANCOREADY) IVPB 1500 mg/300 mL, 1,500 mg, Intravenous, Q24H, [name], [name], Last Rate: 150 mL/hr at 09/15/19 1400, Rate Verify at 09/15/19 1400  No Known Allergies   Objective: Today's Vitals   09/15/19 0940 09/15/19 1232 09/15/19 1352 09/15/19 1408  BP: 127/67  (!) 132/52   Pulse: 77  75   Resp: 18  16   Temp: 98.7 F (37.1 C)  98.2 F (36.8 C)   TempSrc: Oral  Oral   SpO2: 98%  98%   Weight:      Height:      PainSc:  4   8     General: No acute distress  Right lower extremity: Dressing to Right foot clean, dry, intact. No strikethrough noted, upon removal of the dressing wound bed at amputation stump site well approximated with sutures and staples that are intact with no gapping or dehiscence, minimal periincisional erythema or edema, mild bloody active drainage, no significant redness or warmth, Dry eschar to lateral hallux with no  signs of infection, no pain to palpation to the right foot, no calf pain bilateral. Range of motion excluding surgical site within normal limits with no pain or crepitation.  Status post second toe amputation.   Labs in chart  Micro in chart   Post Op Xray, Right foot IMPRESSION: RIGHT second toe amputation.  Scattered degenerative changes.  No acute abnormalities.  Assessment and Plan:  Problem List Items Addressed  This Visit      Other   Gangrenous toe ([place]) - Primary    Other Visit Diagnoses    Post-operative state       Relevant Orders   DG Foot 2 Views Right (Completed)     -Patient seen and evaluated at bedside -Chart reviewed -Dressing change performed -Advised patient to make sure to keep dressing clean, dry, and intact to right foot patient will follow up in office later this week for continued postoperative care/dressing changes -Patient is stable from podiatry point of view for discharge home tomorrow evening with oral Augmentin for 7 days -Patient may partially weight-bear to heel with cam boot and rolling walker  Landis Martins, [name] Rounding for [name] [facility] [phone] cell

## 2019-09-15 NOTE — Plan of Care (Signed)
  Problem: Education: Goal: Knowledge of General Education information will improve Description: Including pain rating scale, medication(s)/side effects and non-pharmacologic comfort measures Outcome: Progressing   Problem: Clinical Measurements: Goal: Respiratory complications will improve Outcome: Progressing Goal: Cardiovascular complication will be avoided Outcome: Progressing   Problem: Nutrition: Goal: Adequate nutrition will be maintained Outcome: Progressing   Problem: Elimination: Goal: Will not experience complications related to urinary retention Outcome: Progressing

## 2019-09-16 DIAGNOSIS — L97509 Non-pressure chronic ulcer of other part of unspecified foot with unspecified severity: Secondary | ICD-10-CM

## 2019-09-16 DIAGNOSIS — E11621 Type 2 diabetes mellitus with foot ulcer: Secondary | ICD-10-CM

## 2019-09-16 LAB — CBC
HCT: 43.7 % (ref 39.0–52.0)
Hemoglobin: 14.8 g/dL (ref 13.0–17.0)
MCH: 29.4 pg (ref 26.0–34.0)
MCHC: 33.9 g/dL (ref 30.0–36.0)
MCV: 86.7 fL (ref 80.0–100.0)
Platelets: 194 10*3/uL (ref 150–400)
RBC: 5.04 MIL/uL (ref 4.22–5.81)
RDW: 11.9 % (ref 11.5–15.5)
WBC: 7.7 10*3/uL (ref 4.0–10.5)
nRBC: 0 % (ref 0.0–0.2)

## 2019-09-16 LAB — MAGNESIUM: Magnesium: 2.2 mg/dL (ref 1.7–2.4)

## 2019-09-16 LAB — BASIC METABOLIC PANEL
Anion gap: 8 (ref 5–15)
BUN: 15 mg/dL (ref 8–23)
CO2: 27 mmol/L (ref 22–32)
Calcium: 8.4 mg/dL — ABNORMAL LOW (ref 8.9–10.3)
Chloride: 98 mmol/L (ref 98–111)
Creatinine, Ser: 1.39 mg/dL — ABNORMAL HIGH (ref 0.61–1.24)
GFR calc Af Amer: 57 mL/min — ABNORMAL LOW (ref >60)
GFR calc non Af Amer: 49 mL/min — ABNORMAL LOW (ref >60)
Glucose, Bld: 123 mg/dL — ABNORMAL HIGH (ref 70–99)
Potassium: 3.9 mmol/L (ref 3.5–5.1)
Sodium: 133 mmol/L — ABNORMAL LOW (ref 135–145)

## 2019-09-16 LAB — GLUCOSE, CAPILLARY
Glucose-Capillary: 114 mg/dL — ABNORMAL HIGH (ref 70–99)
Glucose-Capillary: 141 mg/dL — ABNORMAL HIGH (ref 70–99)

## 2019-09-16 MED ORDER — DILTIAZEM HCL ER COATED BEADS 120 MG PO CP24
120.0000 mg | ORAL_CAPSULE | Freq: Every day | ORAL | Status: DC
  Administered 2019-09-16: 120 mg via ORAL
  Filled 2019-09-16: qty 1

## 2019-09-16 MED ORDER — AMOXICILLIN-POT CLAVULANATE 875-125 MG PO TABS: 1.0000 | ORAL_TABLET | Freq: Two times a day (BID) | ORAL | 0 refills | Status: AC

## 2019-09-16 NOTE — Care Management Important Message (Signed)
Important Message  Patient Details  Name: Zachary [name] MRN: [id number] Date of Birth: 15-Oct-1945   Medicare Important Message Given:     Document was given to Lennart Pall  to give to the patient.     Crista Luria 09/16/2019, 10:32 AM

## 2019-09-16 NOTE — Discharge Summary (Signed)
Physician Discharge Summary  Zachary [name] HDQ:[id number] DOB: [birth year] DOA: 09/13/2019  PCP: [name], MD  Admit date: 09/13/2019 Discharge date: 09/16/2019  Admitted From: Home  Discharge disposition: Home  Recommendations for Outpatient Follow-Up:   . Follow up with your primary care provider in one week.  . Check CBC, BMP, magnesium in the next visit. . Follow-up with [name], podiatry in 1 week. . Advised to keep the dressing clean dry and intact until seen by podiatry. Faythe Ghee to  partially weight-bear to heel with cam boot and rolling walker.   Discharge Diagnosis:   Active Problems:   Acute osteomyelitis ([place])   Gangrenous toe ([place])   Discharge Condition: Improved.  Diet recommendation: Low sodium, heart healthy.  Carbohydrate-modified.    Wound care: keep dry until seen by podiatry  Code status: Full.   History of Present Illness:   Zachary [name] a 74 y.o.malewith medical history significant ofdiabetes, hyperlipidemia, hypertension and peripheral vascular disease who presented to the hospital with right second toe ulceration, infection and purulence from podiatry office sent by [name]. Patient has been caring for his wife who recently had a stroke and has been essentially ignoring his own foot infection for at least a few weeks per his history. [name] was attempting to salvage with antibiotics which failed in the outpatient setting now recommending amputation. In  the ED, without leukocytosis fever or any sepsis criteria, mild AKI with creatinine at 1.5 but otherwise patient feels quite well other than painful right second toe. Dr March Rummage recommended ceftriaxone and vancomycin and amputation of the toe. Patient was then admitted to hospital for IV antibiotics and amputation of the affected toe.  Hospital Course:   Following conditions were addressed during hospitalization as listed below,  Acute osteomyelitis of the second right toe with gangrene  with right dorsal foot cellulitis. Received IV vancomycin and Rocephin during hospitalization.  [name], podiatry saw the patient and patient underwent  right second toe amputation on 09/14/2019..   Aspirin and Plavix will be initiated on discharge we will hold for surgery.  Tissue culture from surgical intervention showing moderate gram-positive cocci in pairs and few gram-positive rods.  Will prescribe Augmentin on discharge. Blood cultures negative in less than 3 days.  Diabetes mellitus type II.  Continue sliding scale insulin, Accu-Cheks, diabetic diet.  On insulin at home.  Will resume on his.  POC glucose of 141.  Essential hypertension.  On Cardizem at home.  Will resume today.  Disposition.  At this time, patient is stable for disposition home.  Patient will have to follow-up with  podiatry and primary care physician as outpatient.  Medical Consultants:    Podiatry [name]  Procedures:    Status post right second toe amputation on 09/14/2019 . Subjective:   Today, patient is okay.  Denies any pain, nausea, vomiting, fever or chills.  Discharge Exam:   Vitals:   09/16/19 0531 09/16/19 1053  BP: (!) 142/62 (!) 152/68  Pulse: 70 73  Resp: 17   Temp: 97.8 F (36.6 C)   SpO2: 98% 97%   Vitals:   09/15/19 1352 09/15/19 2135 09/16/19 0531 09/16/19 1053  BP: (!) 132/52 (!) 160/60 (!) 142/62 (!) 152/68  Pulse: 75 82 70 73  Resp: 16 15 17    Temp: 98.2 F (36.8 C) 98.4 F (36.9 C) 97.8 F (36.6 C)   TempSrc: Oral     SpO2: 98% 97% 98% 97%  Weight:  Height:       General: Alert awake, not in obvious distress HENT: pupils equally reacting to light,  No scleral pallor or icterus noted. Oral mucosa is moist.  Chest:  Clear breath sounds.  Diminished breath sounds bilaterally. No crackles or wheezes.  CVS: S1 &S2 heard. No murmur.  Regular rate and rhythm. Abdomen: Soft, nontender, nondistended.  Bowel sounds are heard.   Extremities:   Right foot with dressing.   Status post right second toe amputation. Psych: Alert, awake and oriented, normal mood CNS:  No cranial nerve deficits.  Power equal in all extremities.   Skin: Warm and dry.  Right foot status post right second toe amputation  The results of significant diagnostics from this hospitalization (including imaging, microbiology, ancillary and laboratory) are listed below for reference.     Diagnostic Studies:   DG Foot 2 Views Right  Result Date: 09/14/2019 CLINICAL DATA:  Post amputation second toe EXAM: RIGHT FOOT - 2 VIEW COMPARISON:  09/13/2019 FINDINGS: Amputation of RIGHT second toe with skin clips, soft tissue swelling and dressing artifacts at amputation bed. Low normal osseous mineralization. Degenerative changes at intertarsal joints and first MTP joint. No fracture, dislocation, or bone destruction. Bulky plantar and Achilles insertion calcaneal spurs. IMPRESSION: RIGHT second toe amputation. Scattered degenerative changes. No acute abnormalities. Electronically Signed   By: Lavonia Dana M.D.   On: 09/14/2019 14:12     Labs:   Basic Metabolic Panel: Recent Labs  Lab 09/13/19 1154 09/13/19 1154 09/14/19 0329 09/14/19 0329 09/15/19 0357 09/16/19 0355  NA 141  --  140  --  136 133*  K 4.4   < > 4.0   < > 4.3 3.9  CL 103  --  102  --  101 98  CO2 27  --  29  --  26 27  GLUCOSE 142*  --  95  --  157* 123*  BUN 19  --  22  --  19 15  CREATININE 1.49*  --  1.42*  --  1.52* 1.39*  CALCIUM 9.2  --  8.7*  --  8.3* 8.4*  MG  --   --   --   --  2.0 2.2   < > = values in this interval not displayed.   GFR Estimated Creatinine Clearance: 48.3 mL/min (A) (by C-G formula based on SCr of 1.39 mg/dL (H)). Liver Function Tests: Recent Labs  Lab 09/14/19 0329  AST 14*  ALT 15  ALKPHOS 58  BILITOT 0.5  PROT 6.9  ALBUMIN 3.4*   No results for input(s): LIPASE, AMYLASE in the last 168 hours. No results for input(s): AMMONIA in the last 168 hours. Coagulation profile No results for  input(s): INR, PROTIME in the last 168 hours.  CBC: Recent Labs  Lab 09/13/19 1154 09/14/19 0329 09/15/19 0357 09/16/19 0355  WBC 10.2 6.2 7.4 7.7  NEUTROABS 8.3*  --   --   --   HGB 16.9 14.4 14.1 14.8  HCT 49.2 43.1 41.6 43.7  MCV 87.2 88.5 87.8 86.7  PLT 232 191 192 194   Cardiac Enzymes: No results for input(s): CKTOTAL, CKMB, CKMBINDEX, TROPONINI in the last 168 hours. BNP: Invalid input(s): POCBNP CBG: Recent Labs  Lab 09/15/19 0725 09/15/19 1154 09/15/19 1652 09/15/19 2138 09/16/19 0716  GLUCAP 139* 165* 88 133* 114*   D-Dimer No results for input(s): DDIMER in the last 72 hours. Hgb A1c Recent Labs    09/13/19 1311  HGBA1C 6.1*  Lipid Profile No results for input(s): CHOL, HDL, LDLCALC, TRIG, CHOLHDL, LDLDIRECT in the last 72 hours. Thyroid function studies No results for input(s): TSH, T4TOTAL, T3FREE, THYROIDAB in the last 72 hours.  Invalid input(s): FREET3 Anemia work up No results for input(s): VITAMINB12, FOLATE, FERRITIN, TIBC, IRON, RETICCTPCT in the last 72 hours. Microbiology Recent Results (from the past 240 hour(s))  SARS Coronavirus 2 by RT PCR (hospital order, performed in [facility] hospital lab) Nasopharyngeal Nasopharyngeal Swab     Status: None   Collection Time: 09/13/19 11:54 AM   Specimen: Nasopharyngeal Swab  Result Value Ref Range Status   SARS Coronavirus 2 NEGATIVE NEGATIVE Final    Comment: (NOTE) SARS-CoV-2 target nucleic acids are NOT DETECTED. The SARS-CoV-2 RNA is generally detectable in upper and lower respiratory specimens during the acute phase of infection. The lowest concentration of SARS-CoV-2 viral copies this assay can detect is 250 copies / mL. A negative result does not preclude SARS-CoV-2 infection and should not be used as the sole basis for treatment or other patient management decisions.  A negative result may occur with improper specimen collection / handling, submission of specimen other than  nasopharyngeal swab, presence of viral mutation(s) within the areas targeted by this assay, and inadequate number of viral copies (<250 copies / mL). A negative result must be combined with clinical observations, patient history, and epidemiological information. Fact Sheet for Patients:   StrictlyIdeas.no Fact Sheet for Healthcare Providers: BankingDealers.co.za This test is not yet approved or cleared  by the [place] FDA and has been authorized for detection and/or diagnosis of SARS-CoV-2 by FDA under an Emergency Use Authorization (EUA).  This EUA will remain in effect (meaning this test can be used) for the duration of the COVID-19 declaration under Section 564(b)(1) of the Act, 21 U.S.C. section 360bbb-3(b)(1), unless the authorization is terminated or revoked sooner. Performed at [facility], [place] [address], [place], [place] [address]   Blood culture (routine x 2)     Status: None (Preliminary result)   Collection Time: 09/13/19 12:10 PM   Specimen: BLOOD  Result Value Ref Range Status   Specimen Description   Final    BLOOD BLOOD RIGHT FOREARM Performed at [place] [address], [place], [place] [address]    Special Requests   Final    BOTTLES DRAWN AEROBIC AND ANAEROBIC Blood Culture results may not be optimal due to an inadequate volume of blood received in culture bottles Performed at [place] [address] [place], [place] [address]    Culture   Final    NO GROWTH 3 DAYS Performed at [facility], [place] [address] [place], [place] [address]    Report Status PENDING  Incomplete  Blood culture (routine x 2)     Status: None (Preliminary result)   Collection Time: 09/13/19 12:15 PM   Specimen: BLOOD  Result Value Ref Range Status   Specimen Description   Final    BLOOD BLOOD LEFT FOREARM Performed at [place]  [address], [place], [place] [address]    Special Requests   Final    BOTTLES DRAWN AEROBIC AND ANAEROBIC Blood Culture adequate volume Performed at [place] [address] [place], [place] [address]    Culture   Final    NO GROWTH 3 DAYS Performed at [facility], [place] [address] [place], [place] [address]    Report Status PENDING  Incomplete  Surgical PCR screen     Status: None   Collection Time: 09/14/19  5:21 AM   Specimen: Nasal Mucosa; Nasal Swab  Result Value Ref Range Status   MRSA, PCR NEGATIVE NEGATIVE Final   Staphylococcus aureus NEGATIVE NEGATIVE Final    Comment: (NOTE) The Xpert SA Assay (FDA approved for NASAL specimens in patients 74 years of age and older), is one component of a comprehensive surveillance program. It is not intended to diagnose infection nor to guide or monitor treatment. Performed at [facility], [place] [address] [place], [place] [address]   Aerobic/Anaerobic Culture (surgical/deep wound)     Status: None (Preliminary result)   Collection Time: 09/14/19  7:49 AM   Specimen: Wound  Result Value Ref Range Status   Specimen Description   Final    WOUND Performed at [facility] [place] [address], [place], [place] [address]    Special Requests   Final    NONE Performed at [facility], [place] [address] [place], [place] [address]    Gram Stain   Final    RARE WBC PRESENT, PREDOMINANTLY PMN MODERATE GRAM POSITIVE COCCI IN PAIRS IN CLUSTERS FEW GRAM POSITIVE RODS    Culture   Final    CULTURE REINCUBATED FOR BETTER GROWTH Performed at [facility], [place] [address], [place], [place] [address]    Report Status PENDING  Incomplete     Discharge Instructions:   Discharge Instructions    Call MD for:   Complete by: As directed    Fever, worsening symptoms.   Diet Carb Modified   Complete by: As directed    Increase activity slowly   Complete by: As  directed    may partially weight-bear to heel with cam boot and rolling walker   Leave dressing on - Keep it clean, dry, and intact until clinic visit   Complete by: As directed      Allergies as of 09/16/2019   No Known Allergies     Medication List    TAKE these medications   acetaminophen 500 MG tablet Commonly known as: TYLENOL Take 1,000 mg by mouth every 6 (six) hours as needed.   amoxicillin-clavulanate 875-125 MG tablet Commonly known as: Augmentin Take 1 tablet by mouth 2 (two) times daily for 7 days.   aspirin EC 81 MG tablet Take 81 mg by mouth daily.   clopidogrel 75 MG tablet Commonly known as: PLAVIX Take 75 mg by mouth daily.   diltiazem 120 MG 24 hr capsule Commonly known as: DILACOR XR Take 120 mg by mouth daily.   fish oil-omega-3 fatty acids 1000 MG capsule Take 2 g by mouth daily.   Soliqua 100-33 UNT-MCG/ML Sopn Generic drug: Insulin Glargine-Lixisenatide Inject 60 Units into the skin daily.            Durable Medical Equipment  (From admission, onward)         Start     Ordered   09/16/19 1123  DME Walker  Once    Question Answer Comment  Walker: With 5 Inch Wheels   Patient needs a walker to treat with the following condition Amputation of toe ([place])      09/16/19 1124           Discharge Care Instructions  (From admission, onward)         Start     Ordered   09/16/19 0000  Leave dressing on - Keep  it clean, dry, and intact until clinic visit     09/16/19 1124         Follow-up Information    [name], MD Follow up.   Specialty: Internal Medicine Why: Regular follow-up Contact information: [address] [place] [address] [phone]        [name], [name] Follow up.   Specialty: Podiatry Why: for post op followup Contact information: [address] [place] [address] [phone]           Time coordinating discharge: 39 minutes  Signed:  Amaan Meyer  [facility] 09/16/2019, 11:24 AM

## 2019-09-16 NOTE — TOC Transition Note (Signed)
Transition of Care [facility]) - CM/SW Discharge Note   Patient Details  Name: Zachary [name] MRN: [id number] Date of Birth: 04-20-1945  Transition of Care [facility]) CM/SW Contact:  Lennart Pall, LCSW Phone Number: 09/16/2019, 1:54 PM   Clinical Narrative:   Pt for d/c today.  RW ordered.  No further TOC needs    Final next level of care: Home/Self Care Barriers to Discharge: Barriers Resolved   Patient Goals and CMS Choice Patient states their goals for this hospitalization and ongoing recovery are:: go home      Discharge Placement                       Discharge Plan and Services                DME Arranged: Walker rolling DME Agency: [employer] Date DME Agency Contacted: 09/16/19 Time DME Agency Contacted: [phone] Representative spoke with at DME Agency: [place] Hernandez: NA [place] Agency: NA        Social Determinants of Health ([place]) Interventions     Readmission Risk Interventions No flowsheet data found.

## 2019-09-16 NOTE — Plan of Care (Signed)

## 2019-09-17 LAB — SURGICAL PATHOLOGY

## 2019-09-18 LAB — CULTURE, BLOOD (ROUTINE X 2)
Culture: NO GROWTH
Culture: NO GROWTH
Special Requests: ADEQUATE

## 2019-09-19 ENCOUNTER — Encounter: Payer: Self-pay | Admitting: Podiatry

## 2019-09-19 ENCOUNTER — Other Ambulatory Visit: Payer: Self-pay

## 2019-09-19 ENCOUNTER — Ambulatory Visit (INDEPENDENT_AMBULATORY_CARE_PROVIDER_SITE_OTHER): Payer: Medicare Other | Admitting: Podiatry

## 2019-09-19 DIAGNOSIS — I96 Gangrene, not elsewhere classified: Secondary | ICD-10-CM

## 2019-09-19 DIAGNOSIS — L97519 Non-pressure chronic ulcer of other part of right foot with unspecified severity: Secondary | ICD-10-CM

## 2019-09-19 DIAGNOSIS — I739 Peripheral vascular disease, unspecified: Secondary | ICD-10-CM

## 2019-09-19 LAB — AEROBIC/ANAEROBIC CULTURE W GRAM STAIN (SURGICAL/DEEP WOUND): Culture: NORMAL

## 2019-09-19 NOTE — Patient Outreach (Signed)
[name]) Care Management  09/19/2019  Zachary [name] [birth year] [id number]   Red [name]: Date of call to patient:  09/18/2019 Reason for alert:  Got discharge papers? ---no   Placed call to patient who answered and identified himself and reports that he is feeling good. Reviewed reason for call and patient states he does not know if he got his papers. Offered to reviewed discharge orders via phone and patient states he does not need to. Reports he has been in contact with his doctor.  PLAN: close case as patient declines needs.  Tomasa Rand, RN, BSN, CEN [facility] [employer] [phone]

## 2019-09-19 NOTE — Progress Notes (Signed)
  Subjective:  Patient ID: Zachary [name], male    DOB: 02-10-1946,  MRN: [id number]  Chief Complaint  Patient presents with  . Post-op Problem    dos-09/14/19- pt is doing well, slight pain, taking otc tylnol. still taking antibiotics- no f/c/v/n/sob/cp     DOS: 09/14/19 Procedure: Right 2nd toe amputtion   74 y.o. male presents with the above complaint. History confirmed with patient.   Objective:  Physical Exam: no tenderness at the surgical site, local edema noted and calf supple, nontender. Hallux and 3rd toe rubor without excessive warmth. Good dorsalis pedis pulse. Unable to palpate PT pulse. Fibrotic wound lateral hallux. Incision: incision coapted.  Assessment:   1. Gangrene of toe of right foot ([place])   2. PAD (peripheral artery disease) ([place])   3. Ulcer of toe, right, with unspecified severity ([place])     Plan:  Patient was evaluated and treated and all questions answered.  Post-operative State -Dressing applied consisting of betadine, sterile gauze, kerlix and ACE bandage -WBAT in CAM boot -He has redness today of the 1st/3rd digits with delayed refill. He does have a good DP pulse. I discussed I am concerned for ischemia. Will refer for STAT vascular studies. -F/u in 1 week  Return in about 1 week (around 09/26/2019).

## 2019-09-20 ENCOUNTER — Telehealth: Payer: Self-pay | Admitting: *Deleted

## 2019-09-20 DIAGNOSIS — E1142 Type 2 diabetes mellitus with diabetic polyneuropathy: Secondary | ICD-10-CM

## 2019-09-20 DIAGNOSIS — I96 Gangrene, not elsewhere classified: Secondary | ICD-10-CM

## 2019-09-20 DIAGNOSIS — I739 Peripheral vascular disease, unspecified: Secondary | ICD-10-CM

## 2019-09-20 DIAGNOSIS — L97519 Non-pressure chronic ulcer of other part of right foot with unspecified severity: Secondary | ICD-10-CM

## 2019-09-20 DIAGNOSIS — M79674 Pain in right toe(s): Secondary | ICD-10-CM

## 2019-09-20 NOTE — Telephone Encounter (Signed)
Faxed orders as STAT to [facility].

## 2019-09-20 NOTE — Telephone Encounter (Signed)
-----   Message from [name], [name] sent at 09/19/2019 10:05 AM EDT ----- Can we order STAT vascular arterial studies. Needs to be done prior to next week

## 2019-09-24 ENCOUNTER — Other Ambulatory Visit: Payer: Self-pay

## 2019-09-24 ENCOUNTER — Ambulatory Visit (HOSPITAL_COMMUNITY)
Admission: RE | Admit: 2019-09-24 | Discharge: 2019-09-24 | Disposition: A | Discharge status: Home / Self Care | Payer: Medicare Other | Source: Ambulatory Visit | Attending: Cardiology | Admitting: Cardiology

## 2019-09-24 ENCOUNTER — Other Ambulatory Visit: Payer: Self-pay | Admitting: Interventional Radiology

## 2019-09-24 DIAGNOSIS — I739 Peripheral vascular disease, unspecified: Secondary | ICD-10-CM | POA: Diagnosis not present

## 2019-09-24 DIAGNOSIS — M79674 Pain in right toe(s): Secondary | ICD-10-CM | POA: Insufficient documentation

## 2019-09-24 DIAGNOSIS — I96 Gangrene, not elsewhere classified: Secondary | ICD-10-CM | POA: Diagnosis not present

## 2019-09-24 DIAGNOSIS — E1142 Type 2 diabetes mellitus with diabetic polyneuropathy: Secondary | ICD-10-CM

## 2019-09-24 DIAGNOSIS — L97519 Non-pressure chronic ulcer of other part of right foot with unspecified severity: Secondary | ICD-10-CM | POA: Diagnosis not present

## 2019-09-25 ENCOUNTER — Ambulatory Visit (HOSPITAL_COMMUNITY)
Admission: RE | Admit: 2019-09-25 | Discharge: 2019-09-25 | Disposition: A | Discharge status: Home / Self Care | Payer: Medicare Other | Source: Ambulatory Visit | Attending: Interventional Radiology | Admitting: Interventional Radiology

## 2019-09-25 ENCOUNTER — Encounter (HOSPITAL_COMMUNITY): Payer: Self-pay

## 2019-09-25 DIAGNOSIS — L97519 Non-pressure chronic ulcer of other part of right foot with unspecified severity: Secondary | ICD-10-CM

## 2019-09-25 DIAGNOSIS — I96 Gangrene, not elsewhere classified: Secondary | ICD-10-CM | POA: Diagnosis not present

## 2019-09-25 DIAGNOSIS — E1142 Type 2 diabetes mellitus with diabetic polyneuropathy: Secondary | ICD-10-CM | POA: Diagnosis not present

## 2019-09-25 DIAGNOSIS — I739 Peripheral vascular disease, unspecified: Secondary | ICD-10-CM

## 2019-09-25 DIAGNOSIS — M79674 Pain in right toe(s): Secondary | ICD-10-CM

## 2019-09-25 MED ORDER — IOHEXOL 350 MG/ML SOLN
100.0000 mL | Freq: Once | INTRAVENOUS | Status: AC | PRN
  Administered 2019-09-25: 100 mL via INTRAVENOUS

## 2019-09-26 ENCOUNTER — Ambulatory Visit (INDEPENDENT_AMBULATORY_CARE_PROVIDER_SITE_OTHER): Payer: Medicare Other | Admitting: Podiatry

## 2019-09-26 ENCOUNTER — Other Ambulatory Visit (HOSPITAL_COMMUNITY): Payer: Self-pay | Admitting: Interventional Radiology

## 2019-09-26 ENCOUNTER — Other Ambulatory Visit: Payer: Self-pay

## 2019-09-26 DIAGNOSIS — I739 Peripheral vascular disease, unspecified: Secondary | ICD-10-CM | POA: Diagnosis not present

## 2019-09-26 DIAGNOSIS — I70229 Atherosclerosis of native arteries of extremities with rest pain, unspecified extremity: Secondary | ICD-10-CM

## 2019-09-26 DIAGNOSIS — E1169 Type 2 diabetes mellitus with other specified complication: Secondary | ICD-10-CM

## 2019-09-26 DIAGNOSIS — B351 Tinea unguium: Secondary | ICD-10-CM

## 2019-09-26 DIAGNOSIS — E1151 Type 2 diabetes mellitus with diabetic peripheral angiopathy without gangrene: Secondary | ICD-10-CM

## 2019-09-26 DIAGNOSIS — I96 Gangrene, not elsewhere classified: Secondary | ICD-10-CM

## 2019-09-26 NOTE — Progress Notes (Signed)
  Subjective:  Patient ID: LUNDEN MCLEISH, male    DOB: [birth year],  MRN: [id number]  Chief Complaint  Patient presents with  . Routine Post Op    Pt denies fever/chills/nausea/vomiting and states he has no concerns.   . Nail Problem    Nail trim 1-5 right foot  . Callouses    Right plantar callous trim   DOS: 09/14/19 Procedure: Right 2nd toe amputation   74 y.o. male presents with the above complaint. History confirmed with patient.   Objective:  Physical Exam: no tenderness at the surgical site, local edema noted and calf supple, nontender. Hallux and 3rd toe rubor without excessive warmth. Good dorsalis pedis pulse. Unable to palpate PT pulse. Fibrotic wound lateral hallux. Incision: incision coapted.   Assessment:   1. Gangrene of toe of right foot ([place])   2. PAD (peripheral artery disease) ([place])   3. Onychomycosis of multiple toenails with type 2 diabetes mellitus and peripheral angiopathy ([place])    Plan:  Patient was evaluated and treated and all questions answered.  Post-operative State -Dressing applied consisting of betadine, sterile gauze, kerlix and ACE bandage -WBAT in CAM boot   PAD -Non-invasive studies reviewed -Pending vascular eval  Ingrown Nail, Dystrophic fungal nails -Debrided x5; hallux debrided in slant back fashion  No follow-ups on file.

## 2019-09-27 ENCOUNTER — Encounter: Payer: Self-pay | Admitting: *Deleted

## 2019-09-27 ENCOUNTER — Ambulatory Visit
Admission: RE | Admit: 2019-09-27 | Discharge: 2019-09-27 | Disposition: A | Discharge status: Home / Self Care | Payer: Medicare Other | Source: Ambulatory Visit | Attending: Interventional Radiology | Admitting: Interventional Radiology

## 2019-09-27 DIAGNOSIS — E1142 Type 2 diabetes mellitus with diabetic polyneuropathy: Secondary | ICD-10-CM

## 2019-09-27 DIAGNOSIS — M79674 Pain in right toe(s): Secondary | ICD-10-CM

## 2019-09-27 DIAGNOSIS — I96 Gangrene, not elsewhere classified: Secondary | ICD-10-CM

## 2019-09-27 DIAGNOSIS — I739 Peripheral vascular disease, unspecified: Secondary | ICD-10-CM

## 2019-09-27 DIAGNOSIS — L97519 Non-pressure chronic ulcer of other part of right foot with unspecified severity: Secondary | ICD-10-CM

## 2019-09-27 HISTORY — PX: IR RADIOLOGIST EVAL & MGMT: IMG5224

## 2019-09-27 NOTE — Consult Note (Signed)
Chief Complaint: Right foot diabetic foot ulcer    Referring Physician(s): Dr. Hardie Pulley  PCP: Dr. [name]  History of Present Illness:   Zachary [name] is a very pleasant 74 year old male, presenting today as a scheduled/urgent consultation for vascular and Interventional Radiology, kindly referred by Dr. 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   He joins [place] today by way of telemedicine visit with his wife, given COVID. We confirmed his identity with 2 identifiers.   Zachary [name] tells me that the wound on his right foot is the first wound that he has ever had, and required amputation after a few weeks of care. He does have a history of toe amputations on the left foot, about 15 years ago.   He denies any resting pain of either lower extremity.   He says he ambulates around the house fine, able to complete all of his ADLs comfortably, and I can elicit no history of short distance claudication.    Noninvasive exam was performed 09/24/2019.  Right ABI: 1.50, likely noncompressible and falsely elevated  Left ABI: 1.50, likely noncompressible and falsely elevated  I am currently unable to visualize the waveforms in the syngo system.    Zachary [name] does tell me that his podiatry team is confident that he is healing well, and they have expressed no concern at his last office visit which was yesterday.    CT angiogram was performed 09/25/2019. This demonstrates mild aortic and iliac disease, moderate to advanced right femoropopliteal disease, and advanced calcifications of the right lower extremity. A believe the right AT is occluded. The left leg has advanced femoropopliteal disease with short segment SFA occlusion in addition to diffuse disease. Diffuse calcifications of the left tibial arteries.    He denies any prior stroke or myocardial infarction. He  denies any resting chest pain. He does not have a cardiologist or endocrinologist.    His primary care physician is Dr. [name]   Past Medical History:  Diagnosis Date  . Diabetes mellitus   . Hypercholesteremia   . Hypertension   . Patella fracture    left  . PVD (peripheral vascular disease) ([place])     Past Surgical History:  Procedure Laterality Date  . AMPUTATION    . 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 AMPUTATION TOE Right 09/14/2019   Procedure: AMPUTATION TOE RIGHT SECOND TOE;  Surgeon: [name], [name];  Location: [facility];  Service: Podiatry;  Laterality: Right;  . IR RADIOLOGIST EVAL & MGMT  09/27/2019  . ORIF PATELLA Left 01/10/2019   Procedure: OPEN REDUCTION INTERNAL (ORIF) FIXATION LEFT PATELLA FRACTURE;  Surgeon: [name], MD;  Location: [place];  Service: Orthopedics;  Laterality: Left;    Allergies: Patient has no known allergies.  Medications: Prior to Admission medications   Medication Sig Start Date End Date Taking? Authorizing Provider  acetaminophen (TYLENOL) 500 MG tablet Take 1,000 mg by mouth every 6 (six) hours as needed.    [provider]  aspirin EC 81 MG tablet Take 81 mg by mouth daily.    [provider]  clopidogrel (PLAVIX) 75 MG tablet Take 75 mg by mouth daily.    [provider]  diltiazem [facility]  XR) 120 MG 24 hr capsule Take 120 mg by mouth daily.    [provider]  ezetimibe (ZETIA) 10 MG tablet Take 10 mg by mouth daily.    [provider]  Insulin Glargine-Lixisenatide (SOLIQUA) 100-33 UNT-MCG/ML SOPN Inject 33 Units into the skin daily.     [provider]     No family history on file.  Social History   Socioeconomic History  . Marital status: Married    Spouse name: Not on file  . Number of children: Not on file   . Years of education: Not on file  . Highest education level: Not on file  Occupational History  . Not on file  Tobacco Use  . Smoking status: Never Smoker  . Smokeless tobacco: Never Used  Substance and Sexual Activity  . Alcohol use: No  . Drug use: No  . Sexual activity: Not on file  Other Topics Concern  . Not on file  Social History Narrative  . Not on file   Social Determinants of Health   Financial Resource Strain:   . Difficulty of Paying Living Expenses:   Food Insecurity:   . Worried About Charity fundraiser in the Last Year:   . Arboriculturist in the Last Year:   Transportation Needs:   . Film/video editor (Medical):   Marland Kitchen Lack of Transportation (Non-Medical):   Physical Activity:   . Days of Exercise per Week:   . Minutes of Exercise per Session:   Stress:   . Feeling of Stress :   Social Connections:   . Frequency of Communication with Friends and Family:   . Frequency of Social Gatherings with Friends and Family:   . Attends Religious Services:   . Active Member of Clubs or Organizations:   . Attends Archivist Meetings:   Marland Kitchen Marital Status:       Review of Systems  Review of Systems: A 12 point ROS discussed and pertinent positives are indicated in the HPI above.  All other systems are negative.  Physical Exam No direct physical exam was performed (except for noted visual exam findings with Video Visits).    Vital Signs: There were no vitals taken for this visit.  Imaging: CT ANGIO AO+BIFEM W & OR WO CONTRAST  Result Date: 09/25/2019 CLINICAL DATA:  Gangrene of the right foot, diabetes EXAM: CT ANGIOGRAPHY AOBIFEM WITHOUT AND WITH CONTRAST TECHNIQUE: CT angiography through the abdomen and pelvis with runoff assessment of the lower extremities. CONTRAST:  148mL OMNIPAQUE IOHEXOL 350 MG/ML SOLN COMPARISON:  CT knee 01/05/2019, CT chest 06/06/2011 FINDINGS: Vascular Findings: Limited thoracic aorta: The aortic root is suboptimally  assessed given cardiac pulsation artifact. Minimal atheromatous plaque in the aortic arch. No aneurysm or ectasia of the included thoracic aorta. Abdominal aorta: Predominantly calcified atheromatous plaque throughout the abdominal aorta without aneurysm or ectasia. No acute luminal abnormality. No periaortic stranding or hemorrhage. Celiac artery: Ostial plaque resulting in at most mild narrowing. Proximal branches normally opacified. Minimal plaque in the splenic artery without other significant stenosis of the proximal branches of the celiac. No evidence of aneurysm, dissection or vasculitis. SMA: Minimal ostial plaque with at most mild narrowing. No other significant stenosis. No evidence of aneurysm, dissection or vasculitis. Right Renal artery: Single right renal artery with minimal plaque but no significant stenosis. No evidence of aneurysm, dissection, vasculitis or features of fibromuscular dysplasia. Left Renal artery: Single left renal artery with some mild ostial plaque and  plaque within the proximal vessel but without significant stenosis. Some distal renal artery branches demonstrate mild plaque as well. No evidence of aneurysm, dissection or vasculitis. IMA: Widely patent ostium. Vessel well opacified without acute luminal abnormality, stenosis or features of vasculitis. -------------------------------------------------------------------------------- RIGHT LOWER EXTREMITY VASCULATURE: Right-sided pelvic vasculature: Moderate plaque in the common and internal iliac. Minimal plaque in the external iliac artery. No significant stenosis or occlusions. No acute luminal abnormality. Right common femoral artery: Calcific plaque in common femoral artery with at most minimal narrowing. No occlusion or acute luminal abnormality. Right deep femoral artery: Patent ostium. Normal opacification of the profundus femora some proximal branches. Moderate plaque without significant stenosis or occlusion. Right  superficial femoral artery: Heavily calcified appearance of the superficial femoral artery with some multifocal segmental narrowing most pronounced proximally, just beyond the bifurcation with the profundus and distally, just proximal to the origin of the popliteal artery. No focal occlusions or acute luminal abnormality. Right popliteal artery: Heavily calcified with moderate irregular segmental narrowing including a segment of severe stenosis with only thread-like opacification just above the joint line (5/285, 18/126). No aneurysm or ectasia. Right lower leg: Extensive atherosclerotic plaque throughout the runoff vessels. High branching of the posterior tibial and peroneal arteries from the tibial peroneal trunk. Multifocal irregular narrowing and stenosis of the anterior tibial artery with attenuation by the level of the mid calf with only 2 vessel runoff to the foot with lack of opacification of the dorsalis pedis. Normal opacification of the plantar arch. --------------------------------------------------------------------------------- LEFT LOWER EXTREMITY VASCULATURE: Left-sided pelvic vasculature: Moderate plaque in the common and internal iliac. Minimal plaque in the external iliac. No significant stenosis or occlusion. No acute luminal abnormality. Left common femoral artery: Moderate plaque throughout the superficial femoral artery with at most mild stenosis. Left deep femoral artery: Mild to moderate ostial narrowing. Normal opacification of the proximal vessel and branches. No acute luminal abnormality. Left superficial femoral artery: Mild narrowing at the level of the ostium with irregular multifocal areas of narrowing secondary to atheromatous plaque throughout the superficial femoral artery course. There is a short segmental occlusion of the vessel at the level of the Hunter's canal but with distal reconstitution via collaterals by the level of the popliteal artery. Left popliteal artery: Popliteal  artery is also heavily diseased with some irregular segmental stenosis just above the joint line and with some more moderate narrowing just proximal the origin of the tibioperoneal trunk. Left lower leg: Runoff vessels of the left lower extremity and heavily diseased. Contrast bolus is largely attenuated likely due to proximal occlusions the posterior tibial artery is largely occluded through much of its course with partial distal reconstitution just proximal to the level of the ankle. Multifocal irregular narrowing is noted of the anterior tibial artery as well but with normal opacification of the dorsalis pedis. Peroneal artery remains patent throughout its course. Review of the MIP images confirms the above findings. -------------------------------------------------------------------------------- Nonvascular Findings: Lower chest: Atelectatic changes in the otherwise clear lung base. Normal heart size. No pericardial effusion. Three-vessel coronary artery disease is noted. A dense calcification of mitral annulus. Additional calcifications present on the aortic leaflets. Hepatobiliary: No focal liver abnormality is seen. No gallstones, gallbladder wall thickening, or biliary dilatation. Pancreas: Unremarkable. No pancreatic ductal dilatation or surrounding inflammatory changes. Spleen: Normal in size without focal abnormality. Adrenals/Urinary Tract: Normal adrenal glands. Kidneys enhance symmetrically and uniformly. No concerning renal masses. No urolithiasis or hydronephrosis. Mild circumferential bladder wall thickening with indentation of the bladder base  by a borderline enlarged prostate. Stomach/Bowel: Distal esophagus, stomach and duodenal sweep are unremarkable. No small bowel wall thickening or dilatation. No evidence of obstruction. A normal appendix is visualized. No colonic dilatation or wall thickening. Lymphatic: No suspicious or enlarged lymph nodes in the included lymphatic chains. Reproductive:  Mild prostatomegaly. No concerning prostate nodules or seminal vesicular abnormality. Other: No abdominopelvic free air or fluid. No organized collection or abscess. There is mild bilateral lower extremity edematous changes but this is asymmetrically increased on the right distal calf ankle and foot. Musculoskeletal: Multilevel degenerative changes are present in the imaged portions of the spine. Multilevel flowing anterior osteophytosis, compatible with features of diffuse idiopathic skeletal hyperostosis (DISH). Subcortical cystic changes noted in the inferior endplate L4 are favored to be on a degenerative basis. Additional moderate degenerative changes throughout the hips and pelvis. Remote left tenth posterior rib deformity. Moderate degenerative changes at the hips. Mild to moderate bilateral tricompartmental degenerative changes of both knees. Postsurgical changes from prior left patellar ORIF with persistent fracture lucency. No evidence of hardware complication or failure. Bilateral patellar enthesopathy is noted. There are advanced degenerative changes throughout both feet with findings most pronounced in the mid and hindfoot likely reflecting at least some mild Charcot arthropathy including subtalar bony fusions. Postsurgical changes from recent right second digit amputation at the level of the metatarsophalangeal joint. Prior left first and second phalangeal amputations as well. No acute or suspicious features of osteomyelitis however resolution is quite limited on this wide field of view CT. Diffuse soft tissue swelling about the forefoot is noted on the right without discernible abscess or collection nor soft tissue gas. Review of the MIP images confirms the above findings. IMPRESSION: VASCULAR FINDINGS 1.  Aortic Atherosclerosis (ICD10-I70.0). 2. Mild ostial narrowing the celiac, SMA and left renal artery origins. 3. Extensive atheromatous plaque throughout the bilateral lower extremities including a  short segmental occlusion of the left SFA at the level of Hunter's canal. Distal reconstitution by the level of the popliteal artery 4. Two vessel runoff on the right lower extremity with attenuation of anterior tibial artery by the mid calf. 5. Two vessel runoff of the left lower extremity with attenuation of the posterior tibial artery with only partial reconstitution to the level of the ankle. 6. Three-vessel coronary artery calcifications. Please note that the presence of coronary artery calcium documents the presence of coronary artery disease, the severity of this disease and any potential stenosis cannot be assessed on this non-gated CT examination. Assessment for potential risk factor modification, dietary therapy or pharmacologic therapy may be warranted. 7. Additional vascular findings as above. NONVASCULAR FINDINGS 1. Diffuse soft tissue swelling about the right forefoot without discernible abscess or collection. No CT evidence of osteomyelitis though resolution is quite limited on this wide field-of-view. 2. Postsurgical changes from recent right second digit amputation at the level of the metatarsophalangeal joint. Prior left first and second phalangeal amputations. 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 3. Left patellar ORIF without acute complication. Persistent lucency across the fracture line with corticated margins suggesting nonunion. 4. Mild circumferential bladder wall thickening with indentation of the bladder base by a borderline enlarged prostate, suggestive of chronic outlet obstruction. Recommend correlation with urinalysis to exclude cystitis. Electronically Signed   By: Lovena Le M.D.   On: 09/25/2019 18:35   DG Foot 2 Views Right  Result Date: 09/14/2019 CLINICAL DATA:  Post amputation second toe EXAM: RIGHT FOOT - 2 VIEW COMPARISON:  09/13/2019 FINDINGS:  Amputation of RIGHT second toe with skin clips, soft tissue swelling and dressing artifacts at  amputation bed. Low normal osseous mineralization. Degenerative changes at intertarsal joints and first MTP joint. No fracture, dislocation, or bone destruction. Bulky plantar and Achilles insertion calcaneal spurs. IMPRESSION: RIGHT second toe amputation. Scattered degenerative changes. No acute abnormalities. Electronically Signed   By: Lavonia Dana M.D.   On: 09/14/2019 14:12   DG Foot Complete Right  Result Date: 09/24/2019 Please see detailed radiograph report in office note.  VAS [place] LE ART SEG MULTI (Segm&LE Reynauds)  Result Date: 09/24/2019 LOWER EXTREMITY DOPPLER STUDY Indications: Gangrene, peripheral artery disease, and Physician states he has              concerns for ischemia. Right seond toe was gangerous and amputated.              Patient denies any claudicaiton and no rest pain. High Risk Factors: Hypertension, Diabetes, no history of smoking. Other Factors: 09/14/19-amputation right second toe. 11/2011- Left second toe                amputation MTP joint. Left great toe amputation.  Limitations: Today's exam was limited due to an open wound and bandages. Performing Technologist: Wilkie Aye RVT  Examination Guidelines: A complete evaluation includes at minimum, Doppler waveform signals and systolic blood pressure reading at the level of bilateral brachial, anterior tibial, and posterior tibial arteries, when vessel segments are accessible. Bilateral testing is considered an integral part of a complete examination. Photoelectric Plethysmograph (PPG) waveforms and toe systolic pressure readings are included as required and additional duplex testing as needed. Limited examinations for reoccurring indications may be performed as noted.  ABI Findings: +---------+-----------------+-----+------------------+-------------------------+ Right    Rt Pressure      IndexWaveform          Comment                            (mmHg)                                                             +---------+-----------------+-----+------------------+-------------------------+ Brachial 167                                                               +---------+-----------------+-----+------------------+-------------------------+ CFA                            biphasic                                    +---------+-----------------+-----+------------------+-------------------------+ Popliteal                      biphasic                                    +---------+-----------------+-----+------------------+-------------------------+ ATA  255              1.50 dampened                                                                   monophasic                                  +---------+-----------------+-----+------------------+-------------------------+ PTA      255              1.50 dampened                                                                   monophasic                                  +---------+-----------------+-----+------------------+-------------------------+ PERO     255              1.50 dampened                                                                   monophasic                                  +---------+-----------------+-----+------------------+-------------------------+ Great Toe                                        Unable to obtain;                                                          bandages                  +---------+-----------------+-----+------------------+-------------------------+ +---------+------------------+-----+----------+----------+ Left     Lt Pressure (mmHg)IndexWaveform  Comment    +---------+------------------+-----+----------+----------+ Brachial 170                                         +---------+------------------+-----+----------+----------+ CFA                             biphasic              +---------+------------------+-----+----------+----------+ Popliteal  monophasic           +---------+------------------+-----+----------+----------+ ATA      255               1.50 monophasic           +---------+------------------+-----+----------+----------+ PTA      255               1.50 monophasic           +---------+------------------+-----+----------+----------+ PERO     255               1.50 monophasic           +---------+------------------+-----+----------+----------+ Great Toe                                 amputation +---------+------------------+-----+----------+----------+ TOES Findings: +----------+---------------+--------+----------+ Right ToesPressure (mmHg)WaveformComment    +----------+---------------+--------+----------+ 1st Digit                Abnormal           +----------+---------------+--------+----------+ 2nd Digit                        amputation +----------+---------------+--------+----------+ 3rd Digit                Abnormal           +----------+---------------+--------+----------+ 4th Digit                Abnormal           +----------+---------------+--------+----------+ 5th Digit                Abnormal           +----------+---------------+--------+----------+  +---------+---------------+--------+----------+ Left ToesPressure (mmHg)WaveformComment    +---------+---------------+--------+----------+ 1st Digit                       amputation +---------+---------------+--------+----------+ 2nd Digit                       amputation +---------+---------------+--------+----------+ 3rd Digit               Abnormal           +---------+---------------+--------+----------+ 4th Digit               Normal             +---------+---------------+--------+----------+ 5th Digit               Normal             +---------+---------------+--------+----------+   Appointment with Dr.  Fletcher Anon 10/08/19  Summary: Right: Resting right ankle-brachial index indicates noncompressible right lower extremity arteries. Left: Resting left ankle-brachial index indicates noncompressible left lower extremity arteries.  *See table(s) above for measurements and observations.  Vascular consult recommended. Electronically signed by Ena Dawley MD on 09/24/2019 at 8:28:32 PM.    Final    VAS [place] LOWER EXTREMITY ARTERIAL DUPLEX  Result Date: 09/24/2019 LOWER EXTREMITY ARTERIAL DUPLEX STUDY Indications: Gangrene, and peripheral artery disease. High Risk Factors: Hypertension, Diabetes, no history of smoking. Other Factors: Physician states he has concerns for ischemia. Right seond toe                was gangerous and amputated. Patient denies any claudicaiton and                no rest pain.  Vascular Interventions:  09/14/19-amputation right second toe. 11/2011- Left second                         toe amputation o MTP joint. Left great toe amputation. Current ABI:            Noncompressible ABI's Performing Technologist: Wilkie Aye RVT  Examination Guidelines: A complete evaluation includes B-mode imaging, spectral Doppler, color Doppler, and power Doppler as needed of all accessible portions of each vessel. Bilateral testing is considered an integral part of a complete examination. Limited examinations for reoccurring indications may be performed as noted.  +----------+--------+-----+---------------+----------+--------+ RIGHT     PSV cm/sRatioStenosis       Waveform  Comments +----------+--------+-----+---------------+----------+--------+ CFA Prox  72                          biphasic           +----------+--------+-----+---------------+----------+--------+ CFA Distal84                          biphasic           +----------+--------+-----+---------------+----------+--------+ DFA       95                          biphasic            +----------+--------+-----+---------------+----------+--------+ SFA Prox  105                         triphasic          +----------+--------+-----+---------------+----------+--------+ SFA Mid   302     4.1  75-99% stenosis                   +----------+--------+-----+---------------+----------+--------+ SFA Distal35                          monophasic         +----------+--------+-----+---------------+----------+--------+ POP Prox  504     15.8 75-99% stenosismonophasic         +----------+--------+-----+---------------+----------+--------+ POP Distal50                          monophasic         +----------+--------+-----+---------------+----------+--------+ TP Trunk  53                          monophasic         +----------+--------+-----+---------------+----------+--------+ ATA Prox  26                          monophasic         +----------+--------+-----+---------------+----------+--------+ ATA Mid   47                          monophasic         +----------+--------+-----+---------------+----------+--------+ PTA Prox                              absent             +----------+--------+-----+---------------+----------+--------+ PTA Mid   28  monophasic         +----------+--------+-----+---------------+----------+--------+ PERO Prox 68                          monophasic         +----------+--------+-----+---------------+----------+--------+ PERO Mid  32                          monophasic         +----------+--------+-----+---------------+----------+--------+ A focal velocity elevation of 302 cm/s was obtained at [facility] SFA with post stenotic turbulence with a VR of 4.1. Findings are characteristic of 75-99% stenosis. A 2nd focal velocity elevation was visualized, measuring 504 cm/s at AK pop with a VR of 15.8. Findings are characteristic of 75-99% stenosis.   +----------+--------+-----+---------------+----------+--------+ LEFT      PSV cm/sRatioStenosis       Waveform  Comments +----------+--------+-----+---------------+----------+--------+ CFA Prox  97                          biphasic           +----------+--------+-----+---------------+----------+--------+ CFA Distal115                         biphasic           +----------+--------+-----+---------------+----------+--------+ DFA       161                         biphasic           +----------+--------+-----+---------------+----------+--------+ SFA Prox  79                          biphasic           +----------+--------+-----+---------------+----------+--------+ SFA Mid   207     6.5  75-99% stenosisbiphasic           +----------+--------+-----+---------------+----------+--------+ SFA Distal             occluded                          +----------+--------+-----+---------------+----------+--------+ POP Prox  41                          monophasic         +----------+--------+-----+---------------+----------+--------+ POP Distal32                          monophasic         +----------+--------+-----+---------------+----------+--------+ TP Trunk  66                          monophasic         +----------+--------+-----+---------------+----------+--------+ ATA Prox  44                          monophasic         +----------+--------+-----+---------------+----------+--------+ PTA Prox                              absent             +----------+--------+-----+---------------+----------+--------+ PERO Prox  absent             +----------+--------+-----+---------------+----------+--------+ A focal velocity elevation of 207 cm/s was obtained at [facility] SFA with a VR of 6.5. Findings are characteristic of 75-99% stenosis.  Summary: Right: Atherosclerosis in the common femoral, femoral, and popliteal and tibial  arteries. 75-99% stenosis in the mid superficial femoral artery. 75-99% stenosis in the distal SFA/AK popliteal artery. The posterior tibial artery appears to be occluded proximally.  Left: Atherosclerosis in the common femoral, femoral, and popliteal and tibial arteries. 75-99% stenosis in the mid superficial femoral artery. High grade stenosis vs. short segment occlusion in the distal SFA/AK popliteal artery. 75-99% stenosis in the distal SFA/ak popliteal, distal to the short segment occlusion in the SFA. Probable occlusion of the posterior tibial, peroneal and mid anterior tibial arteries. Appointment with Dr. Fletcher Anon 10/08/19.  See table(s) above for measurements and observations. Vascular consult recommended. Electronically signed by Ena Dawley MD on 09/24/2019 at 8:27:09 PM.    Final    IR Radiologist Eval & Mgmt  Result Date: 09/27/2019 Please refer to notes tab for details about interventional procedure. (Op Note)   Labs:  CBC: Recent Labs    09/13/19 1154 09/14/19 0329 09/15/19 0357 09/16/19 0355  WBC 10.2 6.2 7.4 7.7  HGB 16.9 14.4 14.1 14.8  HCT 49.2 43.1 41.6 43.7  PLT 232 191 192 194    COAGS: No results for input(s): INR, APTT in the last 8760 hours.  BMP: Recent Labs    09/13/19 1154 09/14/19 0329 09/15/19 0357 09/16/19 0355  NA 141 140 136 133*  K 4.4 4.0 4.3 3.9  CL 103 102 101 98  CO2 27 29 26 27   GLUCOSE 142* 95 157* 123*  BUN 19 22 19 15   CALCIUM 9.2 8.7* 8.3* 8.4*  CREATININE 1.49* 1.42* 1.52* 1.39*  GFRNONAA 45* 48* 44* 49*  GFRAA 52* 55* 51* 57*    LIVER FUNCTION TESTS: Recent Labs    09/14/19 0329  BILITOT 0.5  AST 14*  ALT 15  ALKPHOS 58  PROT 6.9  ALBUMIN 3.4*    TUMOR MARKERS: No results for input(s): AFPTM, CEA, CA199, CHROMGRNA in the last 8760 hours.  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   He has known peripheral vascular disease, with compromise of his noninvasive exam which was performed 09/24/2019.   I had an extensive discussion with [Zachary [name] and his wife] regarding peripheral arterial disease, natural history, anatomy, physiology/ pathophysiology, and treatment options.  As a primary therapy, I emphasized continuing Medical Care with modifying risk factors as the foundation for treatment, including smoking cessation.   Regarding medical therapy, I I emphasized the American heart association goals of pressure and blood sugar control with target Hgb A1C <7, HMG coA reductase inhibitor for the beneficial pleotrophic effects and cardiovascular risk reduction, and antiplatelet medication.    Regarding procedural treatment, I discussed with him surgical options and endovascular options. I discussed with him specific risks of our endovascular procedures including bleeding, infection, kidney injury, contrast reaction, need for further procedure/ surgery, arterial injury/dissection, limb loss, cardiopulmonary collapse, death.  I discussed with him the goals of endovascular procedure which is restoring blood flow to [right lower extremity].    We also had a discussion regarding his current healing trajectory which he tells me that his podiatric team is satisfied with  his wound healing. He has a follow-up scheduled with his podiatry team next Thursday. I did let him know that should he continue to heal adequately, we with possibly be able to D for any intervention, and avoid the inherent risks for endovascular procedure. I did let him know that should he have any concerns for wound healing, or if is podiatric team has any concerns for wound healing, being aggressive with restoring blood flow is indicated, to avoid any further tissue loss/amputation.    At this point, we would plan on holding off of future intervention, in lieu of active surveillance of his  wound healing trajectory. He understands that if he has any regression or plateau, or if there is any concern for infection, intervention is indicated.    I have advised him to call us should he have any concerns with his foot, or call his podiatric team. I also encouraged him to follow up on schedule with his podiatric appointment next Thursday.    Plan  -active surveillance of wound healing, and we will defer intervention at this time, as he is satisfied with his wound healing.  -if there is any concern in the interval for plateau, regression, or concern for infection, lower extremity angiogram and possible intervention is indicated, as the patient does have multi segment vascular disease of the right lower extremity compromising his perfusion.  -Continue maximal medical management  -we will schedule him tentatively for a 3 month follow-up to make sure that he heals, and are happy to see him back at any time earlier to consider intervention      Thank you for this interesting consult.  I greatly enjoyed meeting Zachary [name] and look forward to participating in their care.  A copy of this report was sent to the requesting provider on this date.  Electronically Signed: Corrie Mckusick 09/27/2019, 2:08 PM   I spent a total of  60 Minutes   in remote  clinical consultation, greater than 50% of which was counseling/coordinating care for right lower extremity diabetic foot ulcer, possible angiogram and intervention.    Visit type: Audio only (telephone). Audio (no video) only due to patient's lack of internet/smartphone capability. Alternative for in-person consultation at [facility], [address] [place], [place], [place]. This visit type was conducted due to national recommendations for restrictions regarding the COVID-19 Pandemic (e.g. social distancing).  This format is felt to be most appropriate for this patient at this time.  All issues noted in this document were discussed and  addressed.

## 2019-10-02 ENCOUNTER — Ambulatory Visit (HOSPITAL_COMMUNITY): Admission: RE | Admit: 2019-10-02 | Payer: Medicare Other | Source: Ambulatory Visit

## 2019-10-03 ENCOUNTER — Encounter: Payer: Self-pay | Admitting: Podiatry

## 2019-10-03 ENCOUNTER — Ambulatory Visit (INDEPENDENT_AMBULATORY_CARE_PROVIDER_SITE_OTHER): Payer: Medicare Other | Admitting: Podiatry

## 2019-10-03 ENCOUNTER — Other Ambulatory Visit: Payer: Self-pay

## 2019-10-03 DIAGNOSIS — I739 Peripheral vascular disease, unspecified: Secondary | ICD-10-CM

## 2019-10-03 DIAGNOSIS — M79674 Pain in right toe(s): Secondary | ICD-10-CM

## 2019-10-03 DIAGNOSIS — Z89421 Acquired absence of other right toe(s): Secondary | ICD-10-CM

## 2019-10-03 DIAGNOSIS — E1142 Type 2 diabetes mellitus with diabetic polyneuropathy: Secondary | ICD-10-CM

## 2019-10-03 DIAGNOSIS — I96 Gangrene, not elsewhere classified: Secondary | ICD-10-CM

## 2019-10-03 NOTE — Progress Notes (Signed)
  Subjective:  Patient ID: Zachary [name], male    DOB: [birth year],  MRN: [id number]  Chief Complaint  Patient presents with  . Routine Post Op    1wk F/U POV DOS 09/14/2019- pt doing well, states he has had no issue- has not taken any pain medication- doign well overall    DOS: 09/14/19 Procedure: Right 2nd toe amputation   74 y.o. male presents with the above complaint. History confirmed with patient.  He saw [name] 1 week ago for same.  No changes since last week.  He had a telehealth visit with Dr. 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  Dr. [name] told him that there is a possibility that he could do angiography and/or stents but wants to see how his foot heals.  Objective:  Physical Exam: no tenderness at the surgical site, local edema noted and calf supple, nontender. Hallux and 3rd toe rubor without excessive warmth.  I was unable to palpate the dorsalis pedis pulse. Unable to palpate PT pulse. Fibrotic wound lateral hallux has now coalesced into an eschar. Incision: incision coapted with overlying eschar, sutures and staples left intact today.     Assessment:   1. Gangrene of toe of right foot ([place])   2. PAD (peripheral artery disease) ([facility])   3. Pain of toe of right foot   4. DM type 2 with diabetic peripheral neuropathy ([place])   5. History of amputation of lesser toe of right foot ([place])    Plan:  Patient was evaluated and treated and all questions answered.  Post-operative State -Dressing applied consisting of betadine, sterile gauze, kerlix and ACE bandage -WBAT in CAM boot  -Return in 1 week to follow-up with [name] for suture and staple removal  PAD -Non-invasive studies reviewed as well as the notes from Dr. [name].  I encouraged him to continue to follow-up with him.  We will monitor his progress closely.  The lateral hallux ulcer appears to be coalescing into a ischemic sharp.  We will  reevaluate his incision underlying the eschar of the second toe amputation after suture and staple removal.  He remains at high risk for limb loss.  Return in about 1 week (around 10/10/2019) for follow-up with Dr March Rummage for suture/staple removal and eval.

## 2019-10-04 ENCOUNTER — Encounter: Payer: Self-pay | Admitting: Podiatry

## 2019-10-08 ENCOUNTER — Ambulatory Visit: Payer: Medicare Other | Admitting: Cardiovascular Disease

## 2019-10-10 ENCOUNTER — Telehealth: Payer: Self-pay | Admitting: *Deleted

## 2019-10-10 ENCOUNTER — Other Ambulatory Visit: Payer: Self-pay

## 2019-10-10 ENCOUNTER — Ambulatory Visit (INDEPENDENT_AMBULATORY_CARE_PROVIDER_SITE_OTHER): Payer: Medicare Other | Admitting: Podiatry

## 2019-10-10 VITALS — Temp 97.2°F

## 2019-10-10 DIAGNOSIS — L97511 Non-pressure chronic ulcer of other part of right foot limited to breakdown of skin: Secondary | ICD-10-CM

## 2019-10-10 MED ORDER — SANTYL 250 UNIT/GM EX OINT
1.0000 | TOPICAL_OINTMENT | Freq: Every day | CUTANEOUS | 5 refills | Status: DC
Start: 2019-10-10 — End: 2021-01-15

## 2019-10-10 NOTE — Progress Notes (Signed)
  Subjective:  Patient ID: Zachary [name], male    DOB: [birth year],  MRN: [id number]  Chief Complaint  Patient presents with  . Wound Check    Follow-up - DOS 09/14/2019. Pt stated, "It looks very good. The dressing came off 2 nights ago, but the gauze between my toes is still there. No pain/fever/chills/N&V/foul odor/pus".   DOS: 09/14/19 Procedure: Right 2nd toe amputation   74 y.o. male presents with the above complaint. History confirmed with patient.   Objective:  Physical Exam: no tenderness at the surgical site, local edema noted and calf supple, nontender. Hallux and 3rd toe normal color but with hard eschar lateral hallux. Good dorsalis pedis pulse. Unable to palpate PT pulse.   Incision: incision mild fibrosis measuring 3x1   Assessment:   1. Ulcerated, foot, right, limited to breakdown of skin [facility])    Plan:  Patient was evaluated and treated and all questions answered.  Post-operative State -Sutures and staples removed today. -WTD Dressing applied today. Educated on performing WTD. Order Santyl to help debridement. -At this point the wound does appear much improved without acute vascular concern previously seen.  I agree with Dr. [name] that we can hold off intervention at this time.    Return in about 1 week (around 10/17/2019).

## 2019-10-10 NOTE — Telephone Encounter (Signed)
I informed pt of Dr. Eleanora Neighbor orders for [facility] and that it would come from [facility] that would contact him with coverage and delivery information. Unable to escribe Santyl called orders and pt information to [facility].

## 2019-10-10 NOTE — Telephone Encounter (Signed)
-----   Message from [name], [name] sent at 10/10/2019  2:58 PM EDT ----- Can we order Santyl? I did not discuss with patient today so can you call him and update him on this?

## 2019-10-18 ENCOUNTER — Encounter: Payer: Self-pay | Admitting: Podiatry

## 2019-10-18 ENCOUNTER — Ambulatory Visit (INDEPENDENT_AMBULATORY_CARE_PROVIDER_SITE_OTHER): Payer: Medicare Other | Admitting: Podiatry

## 2019-10-18 ENCOUNTER — Other Ambulatory Visit: Payer: Self-pay

## 2019-10-18 DIAGNOSIS — L97512 Non-pressure chronic ulcer of other part of right foot with fat layer exposed: Secondary | ICD-10-CM

## 2019-10-18 NOTE — Progress Notes (Signed)
°  Subjective:  Patient ID: Zachary [name], male    DOB: Aug 08, 1945,  MRN: [id number]  Chief Complaint  Patient presents with   Routine Post Op      1wk POV DOS 09/14/2019, right foot is doing good per patient, some draining noticed   DOS: 09/14/19 Procedure: Right 2nd toe amputation   74 y.o. male presents with the above complaint. History confirmed with patient. Received the Santyl but unsure how to use it.  Objective:  Physical Exam: no tenderness at the surgical site, local edema noted and calf supple, nontender. Hallux and 3rd toe normal color but with hard eschar lateral hallux. Good dorsalis pedis pulse. Unable to palpate PT pulse.   Incision: incision mild fibrosis measuring 3*1.5   Assessment:   1. Ulcerated, foot, right, with fat layer exposed ([place])    Plan:  Patient was evaluated and treated and all questions answered.  Post-operative State -Sutures and staples removed today. -Santyl WTD Dressing applied today. Educated on performing Santyl WTD.  -Wound continues to improve. Hold off vascular intervention.   Return in about 2 weeks (around 11/01/2019) for Wound Care.

## 2019-10-31 ENCOUNTER — Telehealth: Payer: Self-pay | Admitting: *Deleted

## 2019-10-31 NOTE — Telephone Encounter (Signed)
[name] gave orders for wound supplies today from 10/18/2019 for right 2nd toe wound L97.512 measuring 3.0 x 1.5 x 0.2cm with low exudate full thickness. Required form, clinical and demographics faxed to [facility].

## 2019-11-01 ENCOUNTER — Ambulatory Visit (INDEPENDENT_AMBULATORY_CARE_PROVIDER_SITE_OTHER): Payer: Medicare Other | Admitting: Podiatry

## 2019-11-01 ENCOUNTER — Other Ambulatory Visit: Payer: Self-pay

## 2019-11-01 DIAGNOSIS — L97512 Non-pressure chronic ulcer of other part of right foot with fat layer exposed: Secondary | ICD-10-CM | POA: Diagnosis not present

## 2019-11-01 DIAGNOSIS — L97511 Non-pressure chronic ulcer of other part of right foot limited to breakdown of skin: Secondary | ICD-10-CM

## 2019-11-01 NOTE — Progress Notes (Signed)
  Subjective:  Patient ID: Zachary [name], male    DOB: 08-May-1945,  MRN: [id number]  Chief Complaint  Patient presents with  . Wound Check    R foot. Pt stated, "Doing very well. I stopped using the Santyl 2 days ago because the toes turned red. I used saline instead, and the redness went away". No pain/fever/chills/N&V/foul odor/pus per pt.   DOS: 09/14/19 Procedure: Right 2nd toe amputation   74 y.o. male presents with the above complaint. History confirmed with patient. Objective:  Physical Exam: no tenderness at the surgical site, local edema noted and calf supple, nontender. Good dorsalis pedis pulse. Unable to palpate PT pulse.   Incision: dehiscence 2nd metatarsal area with fibronecrotic area measuring 3x1 post-debridement. Hallux eschar with wound measuring 2x1 post-debridement   Assessment:   1. Ulcerated, foot, right, with fat layer exposed ([place])   2. Ulcer of great toe, right, limited to breakdown of skin [facility])    Plan:  Patient was evaluated and treated and all questions answered.  Post-operative State -Wounds improving, albeit slowly -Switch to WTD, stop Santyl 2/2 irritation -Wounds debrided as below -F/u with Dr. Sherryle Lis in 2 weeks since I will not be in the office, and then 2 weeks therafter with me   Procedure: Excisional Debridement of Wound Indication: Removal of non-viable soft tissue from the wound to promote healing.  Anesthesia: none Pre-Debridement Wound Measurements: 3 cm x 0.8 cm x 0.1 cm, eschar 2x1 Post-Debridement Wound Measurements: 3 cm x 1 cm x 0.1 cm , 2x1 Type of Debridement: Sharp Excisional Tissue Removed: Non-viable soft tissue Instrumentation: 15 blade and tissue nipper Depth of Debridement: subcutaneous tissue. Technique: Sharp excisional debridement to bleeding, viable wound base.  Dressing: Dry, sterile, compression dressing. Disposition: Patient tolerated procedure well. Patient to return in 1 week for follow-up.   Return in about 2  weeks (around 11/15/2019).

## 2019-11-15 ENCOUNTER — Ambulatory Visit (INDEPENDENT_AMBULATORY_CARE_PROVIDER_SITE_OTHER): Payer: Medicare Other | Admitting: Podiatry

## 2019-11-15 ENCOUNTER — Other Ambulatory Visit: Payer: Self-pay

## 2019-11-15 DIAGNOSIS — L97512 Non-pressure chronic ulcer of other part of right foot with fat layer exposed: Secondary | ICD-10-CM | POA: Diagnosis not present

## 2019-11-15 DIAGNOSIS — Z89421 Acquired absence of other right toe(s): Secondary | ICD-10-CM

## 2019-11-15 DIAGNOSIS — I739 Peripheral vascular disease, unspecified: Secondary | ICD-10-CM

## 2019-11-16 NOTE — Progress Notes (Signed)
  Subjective:  Patient ID: Zachary [name], male    DOB: 10-25-1945,  MRN: [id number]    74 y.o. male returns for follow-up of right foot wound.  He says it is doing better, he denies fevers chills nausea vomiting and or odor.  He is responding to saline wet-to-dry dressings on.  Has not had much drainage.  Objective:  Physical Exam: Right foot ulceration of second amputation site.  Mild periwound erythema.  No purulence, no malodor.  No cellulitis.  Fibrotic base.  Stable hallux eschar   Assessment:   1. Ulcerated, foot, right, with fat layer exposed ([place])   2. PAD (peripheral artery disease) ([place])   3. History of amputation of lesser toe of right foot ([place])      Plan:  Patient was evaluated and treated and all questions answered.   -Dressing applied consisting of wet to dry sterile gauze dressings -Offload ulcer with surgical shoe -Wound cleansed and debrided  Procedure: Selective Debridement of Wound Rationale: Removal of devitalized tissue from the wound to promote healing.  Pre-Debridement Wound Measurements: 3.0  cm x 1.0  cm x 0.2 cm  Post-Debridement Wound Measurements: same as pre-debridement. Type of Debridement: sharp selective Tissue Removed: Devitalized soft-tissue Dressing: Dry, sterile, compression dressing. Disposition: Patient tolerated procedure well. Patient to return in 1 week for follow-up.   Return in about 1 week (around 11/22/2019).

## 2019-11-26 ENCOUNTER — Other Ambulatory Visit: Payer: Self-pay

## 2019-11-26 ENCOUNTER — Ambulatory Visit (INDEPENDENT_AMBULATORY_CARE_PROVIDER_SITE_OTHER): Payer: Medicare Other | Admitting: Podiatry

## 2019-11-26 DIAGNOSIS — L97512 Non-pressure chronic ulcer of other part of right foot with fat layer exposed: Secondary | ICD-10-CM | POA: Diagnosis not present

## 2019-11-26 NOTE — Progress Notes (Signed)
  Subjective:  Patient ID: Zachary [name], male    DOB: [birth year],  MRN: [id number]  Chief Complaint  Patient presents with  . Wound Check    Right foot. Pt stated, "It's doing very well. I hope I can wear a regular shoe today. No fever/chills/N&V/pus/redness/foul odor".    74 y.o. male presents for wound care. Hx confirmed with patient.  Objective:  Physical Exam: Wound Location: right 2nd metatarsal, left great toe Wound Measurement: 1x2, 1.5x1 Wound Base: Mixed Granular/Fibrotic Peri-wound: Normal Exudate: Scant/small amount Serosanguinous exudate wound without warmth, erythema, signs of acute infection Assessment:   1. Ulcerated, foot, right, with fat layer exposed ([place])      Plan:  Patient was evaluated and treated and all questions answered.  Ulcer Right 2nd Metatarsal, Toe -Offload ulcer with surgical shoe -Wound cleansed and debrided -WTD Dressing applied -Transition to normal shoe in 2 weeks.  Procedure: Selective Debridement of Wound Rationale: Removal of devitalized tissue from the wound to promote healing.  Pre-Debridement Wound Measurements: 1x2 cm, 1.5x1 cm superficial  Post-Debridement Wound Measurements: same as pre-debridement. Type of Debridement: sharp selective Tissue Removed: Devitalized soft-tissue Dressing: Dry, sterile, compression dressing. Disposition: Patient tolerated procedure well. Patient to return in 1 week for follow-up.   No follow-ups on file.

## 2019-11-26 NOTE — Patient Instructions (Signed)

## 2019-11-27 ENCOUNTER — Other Ambulatory Visit: Payer: Self-pay | Admitting: Interventional Radiology

## 2019-11-27 DIAGNOSIS — I96 Gangrene, not elsewhere classified: Secondary | ICD-10-CM

## 2019-11-27 DIAGNOSIS — I739 Peripheral vascular disease, unspecified: Secondary | ICD-10-CM

## 2019-11-27 DIAGNOSIS — E1142 Type 2 diabetes mellitus with diabetic polyneuropathy: Secondary | ICD-10-CM

## 2019-11-27 DIAGNOSIS — M79674 Pain in right toe(s): Secondary | ICD-10-CM

## 2019-11-27 DIAGNOSIS — L97519 Non-pressure chronic ulcer of other part of right foot with unspecified severity: Secondary | ICD-10-CM

## 2019-12-10 ENCOUNTER — Encounter: Payer: Self-pay | Admitting: Podiatry

## 2019-12-10 ENCOUNTER — Ambulatory Visit (INDEPENDENT_AMBULATORY_CARE_PROVIDER_SITE_OTHER): Payer: Medicare Other | Admitting: Podiatry

## 2019-12-10 ENCOUNTER — Other Ambulatory Visit: Payer: Self-pay

## 2019-12-10 DIAGNOSIS — L97512 Non-pressure chronic ulcer of other part of right foot with fat layer exposed: Secondary | ICD-10-CM | POA: Diagnosis not present

## 2019-12-10 NOTE — Progress Notes (Signed)
°  Subjective:  Patient ID: Zachary [name], male    DOB: 1945/09/13,  MRN: [id number]  Chief Complaint  Patient presents with   Foot Ulcer    2 wk POV DOS 09/14/2019,right foot is "doing fine,just ready to get out of this shoe"    74 y.o. male presents for wound care. Hx confirmed with patient.  Objective:  Physical Exam: Wound Location: right 2nd metatarsal, left great toe Wound Measurement: 2x0.5, 0.8x0.5  Wound Base: Mixed fibrotic Peri-wound: Normal Exudate: Scant/small amount Serosanguinous exudate wound without warmth, erythema, signs of acute infection Assessment:   1. Ulcerated, foot, right, with fat layer exposed ([place])      Plan:  Patient was evaluated and treated and all questions answered.  Ulcer Right 2nd Metatarsal, Toe -Transition to normal shoegear -Debrided and dressed with silvadene and DSD  Procedure: Selective Debridement of Wound Rationale: Removal of devitalized tissue from the wound to promote healing.  Pre-Debridement Wound Measurements: 2x0.5, 0.8x0.5   Post-Debridement Wound Measurements: same as pre-debridement. Type of Debridement: sharp selective Tissue Removed: Devitalized soft-tissue Dressing: Dry, sterile, compression dressing. Disposition: Patient tolerated procedure well. Patient to return in 1 week for follow-up.    Return in about 1 month (around 01/09/2020) for Wound Care.

## 2019-12-15 ENCOUNTER — Other Ambulatory Visit: Payer: Self-pay | Admitting: Orthopaedic Surgery

## 2019-12-18 ENCOUNTER — Encounter: Payer: Self-pay | Admitting: *Deleted

## 2019-12-18 ENCOUNTER — Other Ambulatory Visit: Payer: Self-pay

## 2019-12-18 ENCOUNTER — Ambulatory Visit
Admission: RE | Admit: 2019-12-18 | Discharge: 2019-12-18 | Disposition: A | Discharge status: Home / Self Care | Payer: Medicare Other | Source: Ambulatory Visit | Attending: Interventional Radiology | Admitting: Interventional Radiology

## 2019-12-18 DIAGNOSIS — L97519 Non-pressure chronic ulcer of other part of right foot with unspecified severity: Secondary | ICD-10-CM

## 2019-12-18 DIAGNOSIS — I96 Gangrene, not elsewhere classified: Secondary | ICD-10-CM

## 2019-12-18 DIAGNOSIS — E11621 Type 2 diabetes mellitus with foot ulcer: Secondary | ICD-10-CM | POA: Diagnosis not present

## 2019-12-18 DIAGNOSIS — I739 Peripheral vascular disease, unspecified: Secondary | ICD-10-CM

## 2019-12-18 DIAGNOSIS — M79674 Pain in right toe(s): Secondary | ICD-10-CM

## 2019-12-18 DIAGNOSIS — E1142 Type 2 diabetes mellitus with diabetic polyneuropathy: Secondary | ICD-10-CM

## 2019-12-18 HISTORY — PX: IR RADIOLOGIST EVAL & MGMT: IMG5224

## 2019-12-18 NOTE — Progress Notes (Signed)
Chief Complaint: Right foot diabetic foot ulcer    Referring Physician(s): Dr. Hardie Pulley  PCP: Dr. [name]  History of Present Illness:   Zachary [name] is a very pleasant 74 year old male, presenting today as a scheduled follow up to vascular and Interventional Radiology, kindly referred by Dr. Hardie Pulley of [facility], for a wound check of his right second toe amputation for diabetic foot wound.   He joins [place] today by way of telemedicine visit with his wife, given COVID. We confirmed his identity with 2 identifiers.  We met Zachary [name] in June, and decided that conservative management was best given his wound healing trajectory.   Since that time, he tells me that he has healed completely, and has no concerns.  He is returning to his podiatry office in 3 weeks to get fitted for diabetic shoes.   He denies any new concerns of his right or left foot.       Past Medical History:  Diagnosis Date  . Diabetes mellitus   . Hypercholesteremia   . Hypertension   . Patella fracture    left  . PVD (peripheral vascular disease) ([place])     Past Surgical History:  Procedure Laterality Date  . AMPUTATION    . 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 AMPUTATION TOE Right 09/14/2019   Procedure: AMPUTATION TOE RIGHT SECOND TOE;  Surgeon: [name], [name];  Location: [facility];  Service: Podiatry;  Laterality: Right;  . IR RADIOLOGIST EVAL & MGMT  09/27/2019  . ORIF PATELLA Left 01/10/2019   Procedure: OPEN REDUCTION INTERNAL (ORIF) FIXATION LEFT PATELLA FRACTURE;  Surgeon: [name], MD;  Location: [facility];  Service: Orthopedics;  Laterality: Left;    Allergies: Patient has no known allergies.  Medications: Prior to Admission medications   Medication Sig Start Date End Date Taking? Authorizing Provider    acetaminophen (TYLENOL) 500 MG tablet Take 1,000 mg by mouth every 6 (six) hours as needed.    [provider]  aspirin EC 81 MG tablet Take 81 mg by mouth daily.    [provider]  B-D ULTRAFINE III SHORT PEN 31G X 8 MM MISC Inject into the skin 5 (five) times daily. 11/26/19   [provider]  clopidogrel (PLAVIX) 75 MG tablet Take 75 mg by mouth daily.    [provider]  collagenase (SANTYL) ointment Apply 1 application topically daily. Wound measurement incision mild fibrosis measuring 3x1cm Amount may be determined by pharmacist discretion. 10/10/19   [name], [name]  diltiazem (DILACOR XR) 120 MG 24 hr capsule Take 120 mg by mouth daily.    [provider]  ezetimibe (ZETIA) 10 MG tablet Take 10 mg by mouth daily.    [provider]  Insulin Glargine-Lixisenatide (SOLIQUA) 100-33 UNT-MCG/ML SOPN Inject 33 Units into the skin daily.     [provider]     No family history on file.  Social History   Socioeconomic History  . Marital status: Married    Spouse name: Not on file  . Number of children: Not on file  . Years of education: Not on file  . Highest education level: Not on file  Occupational History  . Not on file  Tobacco Use  . Smoking status: Never Smoker  . Smokeless tobacco:  Never Used  Substance and Sexual Activity  . Alcohol use: No  . Drug use: No  . Sexual activity: Not on file  Other Topics Concern  . Not on file  Social History Narrative  . Not on file   Social Determinants of Health   Financial Resource Strain:   . Difficulty of Paying Living Expenses: Not on file  Food Insecurity:   . Worried About Charity fundraiser in the Last Year: Not on file  . Ran Out of Food in the Last Year: Not on file  Transportation Needs:   . Lack of Transportation (Medical): Not on file  . Lack of Transportation (Non-Medical): Not on file  Physical Activity:   . Days of Exercise per Week: Not on  file  . Minutes of Exercise per Session: Not on file  Stress:   . Feeling of Stress : Not on file  Social Connections:   . Frequency of Communication with Friends and Family: Not on file  . Frequency of Social Gatherings with Friends and Family: Not on file  . Attends Religious Services: Not on file  . Active Member of Clubs or Organizations: Not on file  . Attends Archivist Meetings: Not on file  . Marital Status: Not on file       Review of Systems  Review of Systems: A 12 point ROS discussed and pertinent positives are indicated in the HPI above.  All other systems are negative.  Physical Exam No direct physical exam was performed (except for noted visual exam findings with Video Visits).     Vital Signs: There were no vitals taken for this visit.  Imaging: No results found.  Labs:  CBC: Recent Labs    09/13/19 1154 09/14/19 0329 09/15/19 0357 09/16/19 0355  WBC 10.2 6.2 7.4 7.7  HGB 16.9 14.4 14.1 14.8  HCT 49.2 43.1 41.6 43.7  PLT 232 191 192 194    COAGS: No results for input(s): INR, APTT in the last 8760 hours.  BMP: Recent Labs    09/13/19 1154 09/14/19 0329 09/15/19 0357 09/16/19 0355  NA 141 140 136 133*  K 4.4 4.0 4.3 3.9  CL 103 102 101 98  CO2 _0 GLUCOSE 142* 95 157* 123*  BUN _1 CALCIUM 9.2 8.7* 8.3* 8.4*  CREATININE 1.49* 1.42* 1.52* 1.39*  GFRNONAA 45* 48* 44* 49*  GFRAA 52* 55* 51* 57*    LIVER FUNCTION TESTS: Recent Labs    09/14/19 0329  BILITOT 0.5  AST 14*  ALT 15  ALKPHOS 58  PROT 6.9  ALBUMIN 3.4*    TUMOR MARKERS: No results for input(s): AFPTM, CEA, CA199, CHROMGRNA in the last 8760 hours.     Assessment:  Zachary [name] is a 74 yo male with history of healed right diabetic foot wound.    Although he does have PAD, wound care was adequate for healing of his wound.   I let Zachary [name] know today that we will not schedule for further visits at this time, given he healed, and we are  available should he have any new problem.   I do not see the need for any routine annual ABI surveillance studies, as there is low yield given the degree of non-compression of his arteries.  I think adequate foot inspection and preventive care will be best.   Given the presence of diabetes, healthy foot care was also discussed, in accord with multi-disciplinary, Class 1  recommendations.1,3 Current recommendations advocate daily foot inspection, good nail care, avoiding barefoot walking, properly fitted footwear, seeking care with problems, and consideration of initiating routine podiatric care with at least annual inspection3.   He is currently getting this care with podiatry.    Regarding medical management, maximal medical therapy for reduction of risk factors is indicated as recommended by updated AHA guidelines1.  This includes anti-platelet medication, tight blood glucose control to a HbA1c < 7, tight blood pressure control, maximum-dose HMG-CoA reductase inhibitor, and smoking cessation.    Annual flu vaccination is also recommended, with Class 1 recommendation1.   Plan: - Given his complete healing, we will not schedule for any further follow up.  We are happy to see him back for any new concerns.  -Recommend continued maximal medical therapy  -Observe healthy foot care habits, given the presence of diabetes, with at least annual foot inspection performed in the setting of DM.   He is getting this care with his current podiatry team.   ___________________________________________________________________   1Morley Kos MD, et al. 2016 AHA/ACC Guideline on the Management of Patients With Lower Extremity Peripheral Artery Disease: Executive Summary: A Report of the American College of Cardiology/American Heart Association Task Force on Clinical Practice Guidelines. J Am Coll Cardiol. 2017 Mar 21;69(11):1465-1508. doi: 10.1016/j.jacc.2016.11.008.   2 - Norgren L, et al. TASC II Working  Group. Inter-society consensus for the management of peripheral arterial disease. Int Tressia Miners. 2007 Jun;26(2):81-157. Review. PubMed PMID: 70141030  3 - Hingorani A, et al. The management of diabetic foot: A clinical practice guideline by the Society for Vascular Surgery in collaboration with the [place] and the Society  for Vascular Medicine. J Vasc Surg. 2016 Feb;63(2 Suppl):3S-21S. doi: 10.1016/j.jvs.2015.10.003. PubMed PMID: 13143888.  4 - Corinna Gab, Saab FA, Luberta Mutter, Grant Ruts, [place]well Poe, Driver VR, [place], Lookstein R, van den Baldemar Lenis, Jaff Zachary, Guadalupe Dawn, Henao S, AlMahameed A, Katzen B. Digital Subtraction Angiography Prior to an Amputation for Critical Limb Ischemia (CLI): An Expert Recommendation Statement From the CLI Global Society to Optimize Limb Salvage. J Endovasc Ther. 2020 Aug;27(4):540-546. doi: 10.1177/1526602820928590. Epub 2020 May 29. PMID: 75797282.    Electronically Signed: Corrie Mckusick 12/18/2019, 8:33 AM   I spent a total of    15 Minutes in remote  clinical consultation, greater than 50% of which was counseling/coordinating care for healed right diabetic foot wound.    Visit type: Audio only (telephone). Audio (no video) only due to patient's lack of internet/smartphone capability. Alternative for in-person consultation at [facility], [address], [place], [place]. This visit type was conducted due to national recommendations for restrictions regarding the COVID-19 Pandemic (e.g. social distancing).  This format is felt to be most appropriate for this patient at this time.  All issues noted in this document were discussed and addressed.

## 2020-01-07 ENCOUNTER — Ambulatory Visit (INDEPENDENT_AMBULATORY_CARE_PROVIDER_SITE_OTHER): Payer: Medicare Other | Admitting: Podiatry

## 2020-01-07 ENCOUNTER — Ambulatory Visit: Payer: Medicare Other | Admitting: Orthotics

## 2020-01-07 ENCOUNTER — Other Ambulatory Visit: Payer: Self-pay

## 2020-01-07 ENCOUNTER — Encounter: Payer: Self-pay | Admitting: Podiatry

## 2020-01-07 DIAGNOSIS — I739 Peripheral vascular disease, unspecified: Secondary | ICD-10-CM

## 2020-01-07 DIAGNOSIS — Z89421 Acquired absence of other right toe(s): Secondary | ICD-10-CM

## 2020-01-07 DIAGNOSIS — L97512 Non-pressure chronic ulcer of other part of right foot with fat layer exposed: Secondary | ICD-10-CM | POA: Diagnosis not present

## 2020-01-07 NOTE — Progress Notes (Signed)
  Subjective:  Patient ID: Zachary [name], male    DOB: 1946/01/30,  MRN: [id number]  No chief complaint on file.  74 y.o. male presents for wound care. States the wounds are improving is soaking once or twice weekly, not currently putting anything on them Objective:  Physical Exam: Wound Location: right 2nd metatarsal, left great toe Wound Measurement: 1.2x0.6; 0.3x0.1 Wound Base: Mixed fibrotic Peri-wound: Normal Exudate: Scant/small amount Serosanguinous exudate wound without warmth, erythema, signs of acute infection Assessment:   1. Ulcerated, foot, right, with fat layer exposed ([place])    Plan:  Patient was evaluated and treated and all questions answered.  Ulcer Right 2nd Metatarsal, Toe -Transition to normal shoegear -Debrided and dressed with silvadene and DSD  Procedure: Selective Debridement of Wound Rationale: Removal of devitalized tissue from the wound to promote healing.  Pre-Debridement Wound Measurements: 1.2 cm x 0.6 cm x 0.2 cm  Post-Debridement Wound Measurements: same as pre-debridement. Type of Debridement: sharp selective Tissue Removed: Devitalized soft-tissue Dressing: Dry, sterile, compression dressing. Disposition: Patient tolerated procedure well. Patient to return in 1 week for follow-up.   Return in about 6 weeks (around 02/18/2020) for Wound Care.

## 2020-01-07 NOTE — Progress Notes (Signed)
Patient was measured for med necessity extra depth shoes (x2) w/ 3 pair R [id number] and 1 L5000 left due to 1/2 amputation.  custom f/o. Patient was measured w/ brannock to determine size and width.  Foam impression mold was achieved and deemed appropriate for fabrication of  cmfo.   See [name] chart notes for further documentation and dx codes for determination of medical necessity.  Appropriate forms will be sent to PCP to verify and sign off on medical necessity.

## 2020-01-07 NOTE — Patient Instructions (Signed)
Soak 1-2 times per week for 20 minutes in water and epsom salt.  Apply neosporin or other antibiotic ointment daily and cover with a band-aid. Apply to big toe and the 2nd toe area.

## 2020-02-18 ENCOUNTER — Encounter: Payer: Self-pay | Admitting: Podiatry

## 2020-02-18 ENCOUNTER — Ambulatory Visit (INDEPENDENT_AMBULATORY_CARE_PROVIDER_SITE_OTHER): Payer: Medicare Other | Admitting: Podiatry

## 2020-02-18 ENCOUNTER — Other Ambulatory Visit: Payer: Self-pay

## 2020-02-18 DIAGNOSIS — Z89421 Acquired absence of other right toe(s): Secondary | ICD-10-CM

## 2020-02-18 DIAGNOSIS — I739 Peripheral vascular disease, unspecified: Secondary | ICD-10-CM | POA: Diagnosis not present

## 2020-02-18 DIAGNOSIS — L97512 Non-pressure chronic ulcer of other part of right foot with fat layer exposed: Secondary | ICD-10-CM

## 2020-02-18 NOTE — Progress Notes (Signed)
°  Subjective:  Patient ID: KIERRE HINTZ, male    DOB: 22-May-1945,  MRN: [id number]  Chief Complaint  Patient presents with   Foot Ulcer    left foot,between great and 3rd toes wound f/u appt., doing fine, no drainage, bleeding   74 y.o. male presents for wound care. Thinks the wounds are doing better, awaiting DM shoes. Objective:  Physical Exam: Wound Location: right 2nd metatarsal, left great toe Wound Measurement: epithelialized  Wound Base: Mixed fibrotic Peri-wound: Normal Exudate: Scant/small amount Serosanguinous exudate wound without warmth, erythema, signs of acute infection Assessment:   1. Ulcerated, foot, right, with fat layer exposed ([place])   2. History of amputation of lesser toe of right foot ([place])   3. PAD (peripheral artery disease) ([place])    Plan:  Patient was evaluated and treated and all questions answered.  Ulcer Right 2nd Metatarsal, Toe -Continue normal shogear until DM shoes -Epithelialized wounds, debrided of HPK only -F/u for shoe pickup  No follow-ups on file.

## 2020-02-25 DIAGNOSIS — Z6829 Body mass index (BMI) 29.0-29.9, adult: Secondary | ICD-10-CM | POA: Diagnosis not present

## 2020-02-25 DIAGNOSIS — E1121 Type 2 diabetes mellitus with diabetic nephropathy: Secondary | ICD-10-CM | POA: Diagnosis not present

## 2020-02-25 DIAGNOSIS — E118 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: Secondary | ICD-10-CM | POA: Diagnosis not present

## 2020-02-25 DIAGNOSIS — N183 Chronic kidney disease, stage 3 unspecified: Secondary | ICD-10-CM | POA: Diagnosis not present

## 2020-02-25 DIAGNOSIS — I10 Essential (primary) hypertension: Secondary | ICD-10-CM | POA: Diagnosis not present

## 2020-02-25 DIAGNOSIS — Z23 Encounter for immunization: Secondary | ICD-10-CM | POA: Diagnosis not present

## 2020-02-25 DIAGNOSIS — Z794 Long term (current) use of insulin: Secondary | ICD-10-CM | POA: Diagnosis not present

## 2020-02-25 DIAGNOSIS — E785 Hyperlipidemia, unspecified: Secondary | ICD-10-CM | POA: Diagnosis not present

## 2020-03-09 DIAGNOSIS — Z23 Encounter for immunization: Secondary | ICD-10-CM | POA: Diagnosis not present

## 2020-03-24 ENCOUNTER — Other Ambulatory Visit: Payer: Self-pay

## 2020-03-24 ENCOUNTER — Ambulatory Visit (INDEPENDENT_AMBULATORY_CARE_PROVIDER_SITE_OTHER): Payer: Medicare Other | Admitting: Podiatry

## 2020-03-24 DIAGNOSIS — N400 Enlarged prostate without lower urinary tract symptoms: Secondary | ICD-10-CM | POA: Insufficient documentation

## 2020-03-24 DIAGNOSIS — R972 Elevated prostate specific antigen [PSA]: Secondary | ICD-10-CM | POA: Insufficient documentation

## 2020-03-24 DIAGNOSIS — G5600 Carpal tunnel syndrome, unspecified upper limb: Secondary | ICD-10-CM | POA: Insufficient documentation

## 2020-03-24 DIAGNOSIS — R519 Headache, unspecified: Secondary | ICD-10-CM | POA: Insufficient documentation

## 2020-03-24 DIAGNOSIS — K635 Polyp of colon: Secondary | ICD-10-CM | POA: Insufficient documentation

## 2020-03-24 DIAGNOSIS — B351 Tinea unguium: Secondary | ICD-10-CM

## 2020-03-24 DIAGNOSIS — E1169 Type 2 diabetes mellitus with other specified complication: Secondary | ICD-10-CM | POA: Diagnosis not present

## 2020-03-24 DIAGNOSIS — Z0000 Encounter for general adult medical examination without abnormal findings: Secondary | ICD-10-CM | POA: Insufficient documentation

## 2020-03-24 DIAGNOSIS — Z794 Long term (current) use of insulin: Secondary | ICD-10-CM | POA: Insufficient documentation

## 2020-03-24 DIAGNOSIS — I10 Essential (primary) hypertension: Secondary | ICD-10-CM | POA: Insufficient documentation

## 2020-03-24 DIAGNOSIS — E118 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: Secondary | ICD-10-CM | POA: Insufficient documentation

## 2020-03-24 DIAGNOSIS — E1151 Type 2 diabetes mellitus with diabetic peripheral angiopathy without gangrene: Secondary | ICD-10-CM

## 2020-03-24 DIAGNOSIS — L97512 Non-pressure chronic ulcer of other part of right foot with fat layer exposed: Secondary | ICD-10-CM

## 2020-03-24 DIAGNOSIS — N183 Chronic kidney disease, stage 3 unspecified: Secondary | ICD-10-CM | POA: Insufficient documentation

## 2020-03-24 DIAGNOSIS — Z6830 Body mass index (BMI) 30.0-30.9, adult: Secondary | ICD-10-CM | POA: Insufficient documentation

## 2020-03-24 DIAGNOSIS — E1121 Type 2 diabetes mellitus with diabetic nephropathy: Secondary | ICD-10-CM | POA: Insufficient documentation

## 2020-03-24 DIAGNOSIS — E7801 Familial hypercholesterolemia: Secondary | ICD-10-CM | POA: Insufficient documentation

## 2020-03-24 DIAGNOSIS — R079 Chest pain, unspecified: Secondary | ICD-10-CM | POA: Insufficient documentation

## 2020-03-24 DIAGNOSIS — E785 Hyperlipidemia, unspecified: Secondary | ICD-10-CM | POA: Insufficient documentation

## 2020-03-24 DIAGNOSIS — L97418 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: Secondary | ICD-10-CM | POA: Insufficient documentation

## 2020-03-24 NOTE — Progress Notes (Signed)
°  Subjective:  Patient ID: Zachary [name], male    DOB: 1946-04-02,  MRN: [id number]  Chief Complaint  Patient presents with   Wound Check    Pt states improvement, healing well, denies any concerns.   Diabetes    Diabetic shoe discussion   74 y.o. male presents for wound care. States the wound is doing well. Requests care of his nails today. Objective:  Physical Exam: Wound Location: right 2nd metatarsal, left great toe Wound Measurement: epithelialized  Wound Base: Mixed fibrotic Peri-wound: Normal Exudate: Scant/small amount Serosanguinous exudate wound without warmth, erythema, signs of acute infection  Onychomycosis of the toenails noted.  History of right second amputation, left first and second toe amputations Assessment:   1. Ulcerated, foot, right, with fat layer exposed ([place])   2. 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  Ulcer Right 2nd Metatarsal, Toe -Remains healed. -Awaiting DM shoes  Diabetes and PAD -Patient is diabetic with a qualifying condition for at risk foot care.  Procedure: Nail Debridement Type of Debridement: manual, sharp debridement. Instrumentation: Nail nipper, rotary burr. Number of Nails: 7     No follow-ups on file.

## 2020-04-17 ENCOUNTER — Telehealth: Payer: Self-pay | Admitting: Podiatry

## 2020-04-17 NOTE — Telephone Encounter (Addendum)
Pt left message checking on status of diabetics shoes.  Returned call and left message with male to have pt call me back. Shoes are on back order.

## 2020-04-20 NOTE — Telephone Encounter (Signed)
Spoke to pt and I told him the boots are back ordered until august but we did have some black ones that are all velcro that are simular and he said he would like to try thoes. I have ordered them,.

## 2020-06-08 ENCOUNTER — Telehealth: Payer: Self-pay | Admitting: Podiatry

## 2020-06-08 NOTE — Telephone Encounter (Signed)
Pt left message checking on shoes.   I returned call and explained that the paperwork had expired and he needed an appt with pcp. He is to call to schedule an appt with Dr Maudie Mercury.I am faxing paperwork with note that pt is making an appt.

## 2020-06-23 ENCOUNTER — Ambulatory Visit (INDEPENDENT_AMBULATORY_CARE_PROVIDER_SITE_OTHER): Payer: Medicare Other | Admitting: Podiatry

## 2020-06-23 ENCOUNTER — Other Ambulatory Visit: Payer: Self-pay

## 2020-06-23 DIAGNOSIS — Z89421 Acquired absence of other right toe(s): Secondary | ICD-10-CM

## 2020-06-23 DIAGNOSIS — Z872 Personal history of diseases of the skin and subcutaneous tissue: Secondary | ICD-10-CM

## 2020-06-23 DIAGNOSIS — M21961 Unspecified acquired deformity of right lower leg: Secondary | ICD-10-CM

## 2020-06-23 NOTE — Progress Notes (Signed)
  Subjective:  Patient ID: Zachary [name], male    DOB: [birth year],  MRN: [id number]  Chief Complaint  Patient presents with  . Wound Check    Right foot wound check. Pt. Is doing well.    75 y.o. male presents for wound care.  Doing well denies new complaints.  Still awaiting his diabetic shoes Objective:  Physical Exam: Wound Location: right 2nd metatarsal, left great toe Wound Measurement: epithelialized  Peri-wound: Normal wound without warmth, erythema, signs of acute infection  Onychomycosis of the toenails noted.  History of right second amputation, left first and second toe amputations Assessment:   1. Metatarsal deformity, right   2. History of foot ulcer   3. History of amputation of lesser toe of right foot ([place])    Plan:  Patient was evaluated and treated and all questions answered.  Ulcer Right 2nd Metatarsal, Toe -He is still waiting to DM shoes.  He has a small callus at the plantar aspect first metatarsal which was minimally debrided today.  No open lesions noted today.  We will follow-up in 3 months and once he has his diabetic shoes if his foot remains stable we can transition to only seeing him twice yearly for annual check.  I offered to come back every 3 months for at risk nail care he declined  Return in about 3 months (around 09/23/2020) for Wound check.

## 2020-06-24 DIAGNOSIS — Z794 Long term (current) use of insulin: Secondary | ICD-10-CM | POA: Diagnosis not present

## 2020-06-24 DIAGNOSIS — E1121 Type 2 diabetes mellitus with diabetic nephropathy: Secondary | ICD-10-CM | POA: Diagnosis not present

## 2020-06-24 DIAGNOSIS — E1141 Type 2 diabetes mellitus with diabetic mononeuropathy: Secondary | ICD-10-CM | POA: Diagnosis not present

## 2020-06-24 DIAGNOSIS — Z6829 Body mass index (BMI) 29.0-29.9, adult: Secondary | ICD-10-CM | POA: Diagnosis not present

## 2020-06-24 DIAGNOSIS — E785 Hyperlipidemia, unspecified: Secondary | ICD-10-CM | POA: Diagnosis not present

## 2020-06-24 DIAGNOSIS — E113293 Type 2 diabetes mellitus with mild nonproliferative diabetic retinopathy without macular edema, bilateral: Secondary | ICD-10-CM | POA: Diagnosis not present

## 2020-06-24 DIAGNOSIS — N183 Chronic kidney disease, stage 3 unspecified: Secondary | ICD-10-CM | POA: Diagnosis not present

## 2020-06-24 DIAGNOSIS — E118 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: Secondary | ICD-10-CM | POA: Diagnosis not present

## 2020-06-24 DIAGNOSIS — I10 Essential (primary) hypertension: Secondary | ICD-10-CM | POA: Diagnosis not present

## 2020-09-08 ENCOUNTER — Telehealth: Payer: Self-pay | Admitting: Podiatry

## 2020-09-08 NOTE — Telephone Encounter (Signed)
Pts wife left message on 5.26 asking about shoe status.  I returned call and apologized that I was out of the office and told pt that the inserts are in production and I would call when they come in to schedule him to pick them up.

## 2020-09-25 ENCOUNTER — Ambulatory Visit: Payer: Medicare Other | Admitting: Podiatry

## 2020-09-25 DIAGNOSIS — I10 Essential (primary) hypertension: Secondary | ICD-10-CM | POA: Diagnosis not present

## 2020-09-25 DIAGNOSIS — Z125 Encounter for screening for malignant neoplasm of prostate: Secondary | ICD-10-CM | POA: Diagnosis not present

## 2020-09-25 DIAGNOSIS — E118 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: Secondary | ICD-10-CM | POA: Diagnosis not present

## 2020-09-25 DIAGNOSIS — Z0000 Encounter for general adult medical examination without abnormal findings: Secondary | ICD-10-CM | POA: Diagnosis not present

## 2020-09-25 DIAGNOSIS — R972 Elevated prostate specific antigen [PSA]: Secondary | ICD-10-CM | POA: Diagnosis not present

## 2020-09-25 DIAGNOSIS — E7800 Pure hypercholesterolemia, unspecified: Secondary | ICD-10-CM | POA: Diagnosis not present

## 2020-09-25 DIAGNOSIS — R739 Hyperglycemia, unspecified: Secondary | ICD-10-CM | POA: Diagnosis not present

## 2020-10-02 DIAGNOSIS — I10 Essential (primary) hypertension: Secondary | ICD-10-CM | POA: Diagnosis not present

## 2020-10-02 DIAGNOSIS — N390 Urinary tract infection, site not specified: Secondary | ICD-10-CM | POA: Diagnosis not present

## 2020-10-02 DIAGNOSIS — E785 Hyperlipidemia, unspecified: Secondary | ICD-10-CM | POA: Diagnosis not present

## 2020-10-02 DIAGNOSIS — N183 Chronic kidney disease, stage 3 unspecified: Secondary | ICD-10-CM | POA: Diagnosis not present

## 2020-10-02 DIAGNOSIS — E118 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: Secondary | ICD-10-CM | POA: Diagnosis not present

## 2020-10-02 DIAGNOSIS — L570 Actinic keratosis: Secondary | ICD-10-CM | POA: Diagnosis not present

## 2020-10-02 DIAGNOSIS — I739 Peripheral vascular disease, unspecified: Secondary | ICD-10-CM | POA: Diagnosis not present

## 2020-10-02 DIAGNOSIS — Z0000 Encounter for general adult medical examination without abnormal findings: Secondary | ICD-10-CM | POA: Diagnosis not present

## 2020-10-03 ENCOUNTER — Other Ambulatory Visit: Payer: Self-pay

## 2020-10-03 ENCOUNTER — Ambulatory Visit (INDEPENDENT_AMBULATORY_CARE_PROVIDER_SITE_OTHER): Payer: Medicare Other | Admitting: Podiatry

## 2020-10-03 DIAGNOSIS — E1169 Type 2 diabetes mellitus with other specified complication: Secondary | ICD-10-CM

## 2020-10-03 DIAGNOSIS — B351 Tinea unguium: Secondary | ICD-10-CM

## 2020-10-03 DIAGNOSIS — E1151 Type 2 diabetes mellitus with diabetic peripheral angiopathy without gangrene: Secondary | ICD-10-CM

## 2020-10-03 NOTE — Progress Notes (Signed)
  Subjective:  Patient ID: VITALI SEIBERT, male    DOB: Mar 11, 1946,  MRN: [id number]  Chief Complaint  Patient presents with   Diabetes    At risk diabetic foot care   75 y.o. male presents for wound care.  Still awaiting shoes, denies other issues.  Objective:  Physical Exam: Wound Location: right 2nd metatarsal, left great toe Wound Measurement: epithelialized  Peri-wound: Normal wound without warmth, erythema, signs of acute infection  Onychomycosis of the toenails noted.  History of right second amputation, left first and second toe amputations Assessment:   1. 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  Ulcer Right 2nd Metatarsal, Toe -He is still waiting to DM shoes. Callus appears improved left. No ulcers right  Onychomycosis, hx of toe amputatio -Nails debrided x7  Procedure: Nail Debridement Type of Debridement: manual, sharp debridement. Instrumentation: Nail nipper, rotary burr. Number of Nails: 10    Return in about 3 months (around 01/03/2021).

## 2020-10-14 DIAGNOSIS — N390 Urinary tract infection, site not specified: Secondary | ICD-10-CM | POA: Diagnosis not present

## 2020-10-21 DIAGNOSIS — I10 Essential (primary) hypertension: Secondary | ICD-10-CM | POA: Diagnosis not present

## 2020-10-21 DIAGNOSIS — Z6829 Body mass index (BMI) 29.0-29.9, adult: Secondary | ICD-10-CM | POA: Diagnosis not present

## 2020-10-21 DIAGNOSIS — E1141 Type 2 diabetes mellitus with diabetic mononeuropathy: Secondary | ICD-10-CM | POA: Diagnosis not present

## 2020-10-21 DIAGNOSIS — E785 Hyperlipidemia, unspecified: Secondary | ICD-10-CM | POA: Diagnosis not present

## 2020-10-21 DIAGNOSIS — E113293 Type 2 diabetes mellitus with mild nonproliferative diabetic retinopathy without macular edema, bilateral: Secondary | ICD-10-CM | POA: Diagnosis not present

## 2020-10-21 DIAGNOSIS — Z794 Long term (current) use of insulin: Secondary | ICD-10-CM | POA: Diagnosis not present

## 2020-10-21 DIAGNOSIS — N183 Chronic kidney disease, stage 3 unspecified: Secondary | ICD-10-CM | POA: Diagnosis not present

## 2020-10-21 DIAGNOSIS — E1121 Type 2 diabetes mellitus with diabetic nephropathy: Secondary | ICD-10-CM | POA: Diagnosis not present

## 2020-11-03 ENCOUNTER — Ambulatory Visit (INDEPENDENT_AMBULATORY_CARE_PROVIDER_SITE_OTHER): Payer: Medicare Other | Admitting: Podiatry

## 2020-11-03 ENCOUNTER — Other Ambulatory Visit: Payer: Self-pay

## 2020-11-03 DIAGNOSIS — E11621 Type 2 diabetes mellitus with foot ulcer: Secondary | ICD-10-CM

## 2020-11-03 DIAGNOSIS — L97421 Non-pressure chronic ulcer of left heel and midfoot limited to breakdown of skin: Secondary | ICD-10-CM | POA: Diagnosis not present

## 2020-11-03 NOTE — Progress Notes (Signed)
  Subjective:  Patient ID: Zachary [name], male    DOB: 1945-04-25,  MRN: [id number]  Chief Complaint  Patient presents with   Callouses    Left bottom foot callus causing pain and discomfort when walking.    75 y.o. male presents for wound care.  Still awaiting shoes, denies other issues.  Objective:  Physical Exam: Wound Location: left submet 1 Wound Measurement: 0.2x0.2  Peri-wound: Normal wound without warmth, erythema, signs of acute infection  Onychomycosis of the toenails noted.  History of right second amputation, left first and second toe amputations Assessment:   1. Diabetic ulcer of left midfoot associated with type 2 diabetes mellitus, limited to breakdown of skin ([place])     Plan:  Patient was evaluated and treated and all questions answered.  Ulcer Right 2nd Metatarsal, Toe -He is still waiting to DM shoes.  -Ulcer debrided left 1st met - SSD and band-aid applied -Dispensed courtesy DM insoles to protect his feet until the new insoles come in. Should be here next wek.    Return in about 1 month (around 12/04/2020) for Wound Care.

## 2020-12-08 ENCOUNTER — Other Ambulatory Visit: Payer: Self-pay

## 2020-12-08 ENCOUNTER — Ambulatory Visit (INDEPENDENT_AMBULATORY_CARE_PROVIDER_SITE_OTHER): Payer: Medicare Other | Admitting: Podiatry

## 2020-12-08 DIAGNOSIS — Z89422 Acquired absence of other left toe(s): Secondary | ICD-10-CM

## 2020-12-08 DIAGNOSIS — I739 Peripheral vascular disease, unspecified: Secondary | ICD-10-CM | POA: Diagnosis not present

## 2020-12-08 DIAGNOSIS — Z89412 Acquired absence of left great toe: Secondary | ICD-10-CM

## 2020-12-08 DIAGNOSIS — E1140 Type 2 diabetes mellitus with diabetic neuropathy, unspecified: Secondary | ICD-10-CM

## 2020-12-08 DIAGNOSIS — Z89421 Acquired absence of other right toe(s): Secondary | ICD-10-CM

## 2020-12-08 NOTE — Progress Notes (Signed)
  Subjective:  Patient ID: Zachary [name], male    DOB: 1946/01/23,  MRN: [id number]  Chief Complaint  Patient presents with   Callouses    Left midfoot ulcer follow up. Pt states soreness when walking.     75 y.o. male presents for wound care. New complaint of pain to both legs while walking, maximal at right thigh.  Objective:  Physical Exam: Wound Location: left submet 1 Wound Measurement: epithelialized. Peri-wound: Normal wound without warmth, erythema, signs of acute infection Faintly palpable DP pulses. Absent PT pulses  Onychomycosis of the toenails noted.  History of right second amputation, left first and second toe amputations Assessment:   1. PAD (peripheral artery disease) ([place])    Plan:  Patient was evaluated and treated and all questions answered.  Ulcer Left 1st Metatarsal -Appears healed. Debrided excess skin today -Dispensed toe filler, DM shoes today inserts x3 for right foot. Written and verbal break-in instructions given.    PAD -Order new non-invasive vascular studies. Last performed 09/24/19  Return in about 1 month (around 01/08/2021) for PAD vascular study review, wound check.

## 2020-12-11 ENCOUNTER — Other Ambulatory Visit: Payer: Self-pay

## 2020-12-11 ENCOUNTER — Ambulatory Visit (HOSPITAL_COMMUNITY)
Admission: RE | Admit: 2020-12-11 | Discharge: 2020-12-11 | Disposition: A | Discharge status: Home / Self Care | Payer: Medicare Other | Source: Ambulatory Visit | Attending: Podiatry | Admitting: Podiatry

## 2020-12-11 DIAGNOSIS — I739 Peripheral vascular disease, unspecified: Secondary | ICD-10-CM

## 2021-01-05 ENCOUNTER — Ambulatory Visit: Payer: Medicare Other | Admitting: Podiatry

## 2021-01-08 ENCOUNTER — Ambulatory Visit (INDEPENDENT_AMBULATORY_CARE_PROVIDER_SITE_OTHER): Payer: Medicare Other | Admitting: Podiatry

## 2021-01-08 ENCOUNTER — Other Ambulatory Visit: Payer: Self-pay

## 2021-01-08 ENCOUNTER — Ambulatory Visit: Payer: Medicare Other | Admitting: Podiatry

## 2021-01-08 DIAGNOSIS — E11621 Type 2 diabetes mellitus with foot ulcer: Secondary | ICD-10-CM

## 2021-01-08 DIAGNOSIS — L97421 Non-pressure chronic ulcer of left heel and midfoot limited to breakdown of skin: Secondary | ICD-10-CM | POA: Diagnosis not present

## 2021-01-08 DIAGNOSIS — E1142 Type 2 diabetes mellitus with diabetic polyneuropathy: Secondary | ICD-10-CM | POA: Insufficient documentation

## 2021-01-08 DIAGNOSIS — I739 Peripheral vascular disease, unspecified: Secondary | ICD-10-CM | POA: Diagnosis not present

## 2021-01-15 ENCOUNTER — Other Ambulatory Visit: Payer: Self-pay

## 2021-01-15 ENCOUNTER — Ambulatory Visit (INDEPENDENT_AMBULATORY_CARE_PROVIDER_SITE_OTHER): Payer: Medicare Other | Admitting: Podiatry

## 2021-01-15 DIAGNOSIS — L97421 Non-pressure chronic ulcer of left heel and midfoot limited to breakdown of skin: Secondary | ICD-10-CM | POA: Diagnosis not present

## 2021-01-15 DIAGNOSIS — I739 Peripheral vascular disease, unspecified: Secondary | ICD-10-CM

## 2021-01-15 DIAGNOSIS — E11621 Type 2 diabetes mellitus with foot ulcer: Secondary | ICD-10-CM | POA: Diagnosis not present

## 2021-01-15 MED ORDER — SANTYL 250 UNIT/GM EX OINT: 1.0000 "application " | TOPICAL_OINTMENT | Freq: Every day | CUTANEOUS | 5 refills | Status: DC

## 2021-01-15 MED ORDER — DOXYCYCLINE HYCLATE 100 MG PO TABS: 100.0000 mg | ORAL_TABLET | Freq: Two times a day (BID) | ORAL | 0 refills | Status: DC

## 2021-01-15 NOTE — Progress Notes (Signed)
  Subjective:  Patient ID: Zachary [name], male    DOB: [birth year],  MRN: [id number]  Chief Complaint  Patient presents with   Wound Check    Recheck of right great toe ulcer. Pt states he has been doing well. Very little drainage smaller than a size of a dime.     75 y.o. male presents for wound care. Hx confirmed with patient. Wearing the shoe and insert that was made for him. Objective:  Physical Exam: Wound Location: left submet 1 Wound Measurement: 0.2 x 0.2 ulceration. Peri-wound: Hyperkeratotic wound without warmth, erythema, signs of acute infection Faintly palpable DP pulses. Absent PT pulses  History of right second amputation, left first and second toe amputations   Assessment:   1. Diabetic ulcer of left midfoot associated with type 2 diabetes mellitus, limited to breakdown of skin ([place])   2. PAD (peripheral artery disease) ([place])      Plan:  Patient was evaluated and treated and all questions answered.  Ulcer right great toe -Wound about the same as before. Remains without SOI. -Wound cleansed and debrided -Dressing applied consisting of silvadene, sterile gauze, and kerlix -Additionally padded insert to better offload wound  Procedure: Selective Debridement of Wound Rationale: Removal of devitalized tissue from the wound to promote healing.  Pre-Debridement Wound Measurements: 0.2 cm x 0.2 cm x 0.1 cm  Post-Debridement Wound Measurements: same as pre-debridement. Type of Debridement: sharp selective Instrumentation: dermal curette, tissue nipper Tissue Removed: Devitalized soft-tissue Dressing: Dry, sterile, compression dressing. Disposition: Patient tolerated procedure well.  Return in about 2 weeks (around 01/29/2021) for Wound Care.

## 2021-01-15 NOTE — Progress Notes (Signed)
Subjective:  Patient ID: Zachary [name], male    DOB: 1945-11-05,  MRN: [id number]  Chief Complaint  Patient presents with   Wound Check    Pt states callous is still painful. Vascular study result.   75 y.o. male presents for wound care.  Had his vascular status performed.  Thinks that the toe filler is doing well but he is having pain at the callus under the first metatarsal metatarsal  Objective:  Physical Exam: Wound Location: left submet 1 Wound Measurement: 0.2 x 0.2 ulceration. Peri-wound: Hyperkeratotic wound without warmth, erythema, signs of acute infection Faintly palpable DP pulses. Absent PT pulses  Onychomycosis of the toenails noted.  History of right second amputation, left first and second toe amputations    Study Result  12/11/20   LOWER EXTREMITY DOPPLER STUDY   Patient Name:  Zachary [name]  Date of Exam:   12/11/2020  Medical Rec #: [id number]        Accession #:    [id number]  Date of Birth: 21-Nov-1945        Patient Gender: M  Patient Age:   84 years  Exam Location:  [facility]  Procedure:      VAS [place] ABI WITH/WO TBI  Referring Phys: Kristyne Woodring    ---------------------------------------------------------------------------  -----     High Risk Factors: Diabetes.     Comparison Study: Prior ABIs performed 09/24/2019 showed non compressible                    vessels bilaterally.   Performing Technologist: Delorise Shiner RVT      Examination Guidelines: A complete evaluation includes at minimum, Doppler  waveform signals and systolic blood pressure reading at the level of  bilateral  brachial, anterior tibial, and posterior tibial arteries, when vessel  segments  are accessible. Bilateral testing is considered an integral part of a  complete  examination. Photoelectric Plethysmograph (PPG) waveforms and toe systolic  pressure readings are included as required and additional duplex testing  as  needed. Limited  examinations for reoccurring indications may be performed  as  noted.      ABI Findings:  +---------+------------------+-----+----------+--------+  Right    Rt Pressure (mmHg)IndexWaveform  Comment   +---------+------------------+-----+----------+--------+  Brachial 190                                        +---------+------------------+-----+----------+--------+  PTA      133               0.70 monophasic          +---------+------------------+-----+----------+--------+  DP       255               1.34 monophasic          +---------+------------------+-----+----------+--------+  Great Toe54                0.28                     +---------+------------------+-----+----------+--------+   +---------+------------------+-----+----------+----------+  Left     Lt Pressure (mmHg)IndexWaveform  Comment     +---------+------------------+-----+----------+----------+  Brachial 191                                          +---------+------------------+-----+----------+----------+  PTA      101               0.53 monophasic            +---------+------------------+-----+----------+----------+  DP       100               0.52 monophasic            +---------+------------------+-----+----------+----------+  Great Toe                                 Amputation  +---------+------------------+-----+----------+----------+   +-------+----------------+-----------+------------+------------+  ABI/TBIToday's ABI     Today's TBIPrevious ABIPrevious TBI  +-------+----------------+-----------+------------+------------+  Right  Non compressible0.28                                 +-------+----------------+-----------+------------+------------+  Left   0.52            amputation                           +-------+----------------+-----------+------------+------------+      Arterial wall calcification precludes accurate ankle  pressures and ABIs.     Summary:  Right: Resting right ankle-brachial index indicates noncompressible right  lower extremity arteries. The right toe-brachial index is abnormal.  Although ankle brachial indices are within normal limits (0.95-1.29),  arterial Doppler waveforms at the ankle suggest some component of arterial  occlusive disease.  Left: Resting left ankle-brachial index indicates moderate left lower  extremity arterial disease.     Assessment:   1. PAD (peripheral artery disease) ([place])   2. Diabetic ulcer of left midfoot associated with type 2 diabetes mellitus, limited to breakdown of skin ([place])     Plan:  Patient was evaluated and treated and all questions answered.  Ulcer Left 1st Metatarsal -Wound with slight recurrence.  I applied ischial padding to the toe filler to attempt to further offload the ulceration   PAD -Vascular studies reviewed with patient.  Improved compared to prior with moderate disease on the left side.  The ABI on the left is 0.52.  He has no ulcerations on the right.  At this point I think he has adequate healing but should this wound failed to heal we will discuss for him for vascular eval  Return in about 1 month (around 02/07/2021) for Wound check.

## 2021-01-18 ENCOUNTER — Telehealth: Payer: Self-pay | Admitting: *Deleted

## 2021-01-18 NOTE — Telephone Encounter (Signed)
Patient's wife is calling for saline for patient's wound, was supposed to be sent w/ the santyl, not at pharmacy. Please advise.

## 2021-01-19 NOTE — Telephone Encounter (Signed)
Spoke with pharmacy and they do not have to purchase otc.

## 2021-01-19 NOTE — Telephone Encounter (Signed)
Yes, will do

## 2021-01-19 NOTE — Telephone Encounter (Signed)
Called patient and explained that she may pickup at local pharmacy([facility]),called them and they have in stock. She said that she has called [facility] and they are going to Fed -x it to her by Thursday.

## 2021-01-21 ENCOUNTER — Telehealth: Payer: Self-pay | Admitting: *Deleted

## 2021-01-21 DIAGNOSIS — Z794 Long term (current) use of insulin: Secondary | ICD-10-CM | POA: Diagnosis not present

## 2021-01-21 DIAGNOSIS — Z6829 Body mass index (BMI) 29.0-29.9, adult: Secondary | ICD-10-CM | POA: Diagnosis not present

## 2021-01-21 DIAGNOSIS — E1141 Type 2 diabetes mellitus with diabetic mononeuropathy: Secondary | ICD-10-CM | POA: Diagnosis not present

## 2021-01-21 DIAGNOSIS — E785 Hyperlipidemia, unspecified: Secondary | ICD-10-CM | POA: Diagnosis not present

## 2021-01-21 DIAGNOSIS — E113293 Type 2 diabetes mellitus with mild nonproliferative diabetic retinopathy without macular edema, bilateral: Secondary | ICD-10-CM | POA: Diagnosis not present

## 2021-01-21 DIAGNOSIS — N183 Chronic kidney disease, stage 3 unspecified: Secondary | ICD-10-CM | POA: Diagnosis not present

## 2021-01-21 DIAGNOSIS — I10 Essential (primary) hypertension: Secondary | ICD-10-CM | POA: Diagnosis not present

## 2021-01-21 DIAGNOSIS — E1121 Type 2 diabetes mellitus with diabetic nephropathy: Secondary | ICD-10-CM | POA: Diagnosis not present

## 2021-01-21 NOTE — Telephone Encounter (Signed)
Patient's wife Zachary [name]) is wanting to let the doctor know that he cannot use the Santyl, causes a burning to the skin and  is still looking for the saline otc at drugstore. I told her to try the [facility], called and they said that they have it.

## 2021-01-29 ENCOUNTER — Encounter: Payer: Self-pay | Admitting: Podiatry

## 2021-01-29 ENCOUNTER — Other Ambulatory Visit: Payer: Self-pay

## 2021-01-29 ENCOUNTER — Ambulatory Visit (INDEPENDENT_AMBULATORY_CARE_PROVIDER_SITE_OTHER): Payer: Medicare Other | Admitting: Podiatry

## 2021-01-29 DIAGNOSIS — I739 Peripheral vascular disease, unspecified: Secondary | ICD-10-CM | POA: Diagnosis not present

## 2021-01-29 DIAGNOSIS — L97512 Non-pressure chronic ulcer of other part of right foot with fat layer exposed: Secondary | ICD-10-CM

## 2021-01-29 DIAGNOSIS — E11621 Type 2 diabetes mellitus with foot ulcer: Secondary | ICD-10-CM | POA: Diagnosis not present

## 2021-01-29 DIAGNOSIS — I999 Unspecified disorder of circulatory system: Secondary | ICD-10-CM | POA: Diagnosis not present

## 2021-02-03 NOTE — Progress Notes (Signed)
Subjective:  Patient ID: Zachary [name], male    DOB: 1945-05-30,  MRN: [id number]  Chief Complaint  Patient presents with   Wound Check    Left foot wound check     75 y.o. male presents for wound care.  Patient presents with a new complaint of right hallux wound.  Patient states that this has progressed to gotten worse.  It was closed up in the past.  Patient is known to [name] and had treated him last year.  He had seen Damita Dunnings for consultation however he did not undergo any further treatment for it.  He denies any other acute complaints.  He had ABIs PVRs completed 1 month ago which showed that he had compromised flow to both lower extremity.  He denies any other acute complaints.  He would like to discuss treatment options for this.  Review of Systems: Negative except as noted in the HPI. Denies N/V/F/Ch.  Past Medical History:  Diagnosis Date   Diabetes mellitus    Hypercholesteremia    Hypertension    Patella fracture    left   PVD (peripheral vascular disease) ([place])     Current Outpatient Medications:    acetaminophen (TYLENOL) 500 MG tablet, Take 1,000 mg by mouth every 6 (six) hours as needed., Disp: , Rfl:    aspirin EC 81 MG tablet, Take 81 mg by mouth daily., Disp: , Rfl:    B-D ULTRAFINE III SHORT PEN 31G X 8 MM MISC, Inject into the skin 5 (five) times daily., Disp: , Rfl:    clopidogrel (PLAVIX) 75 MG tablet, Take 75 mg by mouth daily., Disp: , Rfl:    collagenase (SANTYL) ointment, Apply 1 application topically daily., Disp: 30 g, Rfl: 5   diltiazem (CARDIZEM CD) 120 MG 24 hr capsule, , Disp: , Rfl:    diltiazem (DILACOR XR) 120 MG 24 hr capsule, Take 120 mg by mouth daily., Disp: , Rfl:    doxycycline (VIBRA-TABS) 100 MG tablet, Take 1 tablet (100 mg total) by mouth 2 (two) times daily., Disp: 14 tablet, Rfl: 0   ezetimibe (ZETIA) 10 MG tablet, Take 10 mg by mouth daily., Disp: , Rfl:    glucose blood (FREESTYLE LITE) test strip, TEST 3 TIMES A DAY E11.9,  Disp: , Rfl:    Insulin Glargine-Lixisenatide (SOLIQUA) 100-33 UNT-MCG/ML SOPN, Inject 33 Units into the skin daily. , Disp: , Rfl:    Lancets (FREESTYLE) lancets, AS DIRECTED THREE TIMES A DAY, Disp: , Rfl:   Social History   Tobacco Use  Smoking Status Never  Smokeless Tobacco Never    Allergies  Allergen Reactions   Metformin Hcl Other (See Comments)   Pravastatin Sodium Other (See Comments)   Objective:  There were no vitals filed for this visit. There is no height or weight on file to calculate BMI. Constitutional Well developed. Well nourished.  Vascular Dorsalis pedis pulses non palpable bilaterally. Posterior tibial pulses non palpable bilaterally. Capillary refill normal to all digits.  No cyanosis or clubbing noted. Pedal hair growth normal.  Neurologic Normal speech. Oriented to person, place, and time. Protective sensation absent  Dermatologic Wound Location: Right hallux hallux ulceration with fat layer exposed.  No malodor no probing down to bone. Wound Base: Mixed Granular/Fibrotic Peri-wound: Reddened Exudate: Scant/small amount Serosanguinous exudate Wound Measurements: -See below  Orthopedic: No pain to palpation either foot.   Radiographs: None Assessment:   1. PAD (peripheral artery disease) ([facility])   2. Vascular abnormality  3. 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  Ulcer right hallux ulceration with fat layer exposed with history of peripheral artery disease -Debridement as below. -Dressed with Betadine wet-to-dry, DSD. -Continue off-loading with surgical shoe. -Patient will benefit from a vascular intervention prior to any kind of intervention including grafting. -We will be an ideal candidate for grafting once his restoration has slowed. -His ABIs PVRs were reviewed from 2 months ago which shows  peripheral artery disease to both lower extremity.  I will have him follow-up with Dr. Damita Dunnings for further consultation and intervention.  Procedure: Excisional Debridement of Wound Tool: Sharp chisel blade/tissue nipper Rationale: Removal of non-viable soft tissue from the wound to promote healing.  Anesthesia: none Pre-Debridement Wound Measurements: 2 cm x 1.5 cm x 0.3 cm  Post-Debridement Wound Measurements: 2.2 cm x 1.7 cm x 0.3 cm  Type of Debridement: Sharp Excisional Tissue Removed: Non-viable soft tissue Blood loss: Minimal (<50cc) Depth of Debridement: subcutaneous tissue. Technique: Sharp excisional debridement to bleeding, viable wound base.  Wound Progress: This is my initial evaluation I will continue to monitor the progression of the wound. Site healing conversation 7 Dressing: Dry, sterile, compression dressing. Disposition: Patient tolerated procedure well. Patient to return in 1 week for follow-up.  Return in about 2 years (around 01/30/2023) for [employer]	[place] .

## 2021-02-08 ENCOUNTER — Other Ambulatory Visit: Payer: Self-pay | Admitting: Podiatry

## 2021-02-08 DIAGNOSIS — I739 Peripheral vascular disease, unspecified: Secondary | ICD-10-CM

## 2021-02-10 ENCOUNTER — Ambulatory Visit (INDEPENDENT_AMBULATORY_CARE_PROVIDER_SITE_OTHER): Payer: Medicare Other | Admitting: Podiatry

## 2021-02-10 ENCOUNTER — Other Ambulatory Visit: Payer: Self-pay

## 2021-02-10 DIAGNOSIS — L97512 Non-pressure chronic ulcer of other part of right foot with fat layer exposed: Secondary | ICD-10-CM

## 2021-02-10 DIAGNOSIS — E11621 Type 2 diabetes mellitus with foot ulcer: Secondary | ICD-10-CM | POA: Diagnosis not present

## 2021-02-10 DIAGNOSIS — I739 Peripheral vascular disease, unspecified: Secondary | ICD-10-CM

## 2021-02-11 ENCOUNTER — Encounter: Payer: Self-pay | Admitting: Podiatry

## 2021-02-11 ENCOUNTER — Ambulatory Visit
Admission: RE | Admit: 2021-02-11 | Discharge: 2021-02-11 | Disposition: A | Discharge status: Home / Self Care | Payer: Medicare Other | Source: Ambulatory Visit | Attending: Podiatry | Admitting: Podiatry

## 2021-02-11 DIAGNOSIS — E1151 Type 2 diabetes mellitus with diabetic peripheral angiopathy without gangrene: Secondary | ICD-10-CM | POA: Diagnosis not present

## 2021-02-11 DIAGNOSIS — L97519 Non-pressure chronic ulcer of other part of right foot with unspecified severity: Secondary | ICD-10-CM | POA: Diagnosis not present

## 2021-02-11 DIAGNOSIS — I739 Peripheral vascular disease, unspecified: Secondary | ICD-10-CM

## 2021-02-11 DIAGNOSIS — E11621 Type 2 diabetes mellitus with foot ulcer: Secondary | ICD-10-CM | POA: Diagnosis not present

## 2021-02-11 DIAGNOSIS — L97529 Non-pressure chronic ulcer of other part of left foot with unspecified severity: Secondary | ICD-10-CM | POA: Diagnosis not present

## 2021-02-11 HISTORY — PX: IR RADIOLOGIST EVAL & MGMT: IMG5224

## 2021-02-11 NOTE — Progress Notes (Signed)
Subjective:  Patient ID: Zachary [name], male    DOB: 09-29-1945,  MRN: [id number]  Chief Complaint  Patient presents with   Wound Check    Wound check     75 y.o. male presents for wound care.  Patient presents for follow-up of right hallux wound.  Patient states it looks about the same.  They are scheduled to see Dr. Damita Dunnings for peripheral vascular disease tomorrow.  He is here for regular wound check.  Review of Systems: Negative except as noted in the HPI. Denies N/V/F/Ch.  Past Medical History:  Diagnosis Date   Diabetes mellitus    Hypercholesteremia    Hypertension    Patella fracture    left   PVD (peripheral vascular disease) ([place])     Current Outpatient Medications:    acetaminophen (TYLENOL) 500 MG tablet, Take 1,000 mg by mouth every 6 (six) hours as needed., Disp: , Rfl:    aspirin EC 81 MG tablet, Take 81 mg by mouth daily., Disp: , Rfl:    B-D ULTRAFINE III SHORT PEN 31G X 8 MM MISC, Inject into the skin 5 (five) times daily., Disp: , Rfl:    clopidogrel (PLAVIX) 75 MG tablet, Take 75 mg by mouth daily., Disp: , Rfl:    collagenase (SANTYL) ointment, Apply 1 application topically daily., Disp: 30 g, Rfl: 5   diltiazem (CARDIZEM CD) 120 MG 24 hr capsule, , Disp: , Rfl:    diltiazem (DILACOR XR) 120 MG 24 hr capsule, Take 120 mg by mouth daily., Disp: , Rfl:    doxycycline (VIBRA-TABS) 100 MG tablet, Take 1 tablet (100 mg total) by mouth 2 (two) times daily., Disp: 14 tablet, Rfl: 0   ezetimibe (ZETIA) 10 MG tablet, Take 10 mg by mouth daily., Disp: , Rfl:    glucose blood (FREESTYLE LITE) test strip, TEST 3 TIMES A DAY E11.9, Disp: , Rfl:    Insulin Glargine-Lixisenatide (SOLIQUA) 100-33 UNT-MCG/ML SOPN, Inject 33 Units into the skin daily. , Disp: , Rfl:    Lancets (FREESTYLE) lancets, AS DIRECTED THREE TIMES A DAY, Disp: , Rfl:   Social History   Tobacco Use  Smoking Status Never  Smokeless Tobacco Never    Allergies  Allergen Reactions   Metformin  Hcl Other (See Comments)   Pravastatin Sodium Other (See Comments)   Objective:  There were no vitals filed for this visit. There is no height or weight on file to calculate BMI. Constitutional Well developed. Well nourished.  Vascular Dorsalis pedis pulses non palpable bilaterally. Posterior tibial pulses non palpable bilaterally. Capillary refill normal to all digits.  No cyanosis or clubbing noted. Pedal hair growth normal.  Neurologic Normal speech. Oriented to person, place, and time. Protective sensation absent  Dermatologic Wound Location: Right hallux hallux ulceration with fat layer exposed.  No malodor no probing down to bone. Wound Base: Mixed Granular/Fibrotic Peri-wound: Reddened Exudate: Scant/small amount Serosanguinous exudate Wound Measurements: -See below  Orthopedic: No pain to palpation either foot.   Radiographs: None Assessment:   1. PAD (peripheral artery disease) ([place])   2. Diabetic ulcer of toe of right foot associated with type 2 diabetes mellitus, with fat layer exposed ([place])     Plan:  Patient was evaluated and treated and all questions answered.  Ulcer right hallux ulceration with fat layer exposed with history of peripheral artery disease -Debridement as below. -Dressed with Betadine wet-to-dry, DSD. -Continue off-loading with surgical shoe. -Patient will benefit from a vascular intervention prior to any  kind of intervention including grafting. -We will be an ideal candidate for grafting once his restoration has slowed. -His ABIs PVRs were reviewed from 2 months ago which shows peripheral artery disease to both lower extremity.  He is scheduled see Dr. Damita Dunnings tomorrow -I will see him back again after and assess for synthetic graft application as patient has failed greater than 4 weeks of conservative treatment and local wound care.  Procedure: Excisional Debridement of Wound Tool: Sharp chisel blade/tissue nipper Rationale: Removal of  non-viable soft tissue from the wound to promote healing.  Anesthesia: none Pre-Debridement Wound Measurements: 2 cm x 1.5 cm x 0.3 cm  Post-Debridement Wound Measurements: 2.2 cm x 1.7 cm x 0.3 cm  Type of Debridement: Sharp Excisional Tissue Removed: Non-viable soft tissue Blood loss: Minimal (<50cc) Depth of Debridement: subcutaneous tissue. Technique: Sharp excisional debridement to bleeding, viable wound base.  Wound Progress: The wound is stagnant. Dressing: Dry, sterile, compression dressing. Disposition: Patient tolerated procedure well. Patient to return in 1 week for follow-up.  No follow-ups on file.

## 2021-02-11 NOTE — Progress Notes (Signed)
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 Boneta Lucks   PCP: Dr. [name]   History of Present Illness:   Zachary [name] is a very pleasant 75 year old male, presenting today as a scheduled follow up to vascular and Interventional Radiology, for another evaluation of a new wound on the right great toe.   He joins [place] today with his wife in the clinic.    We met Zachary [name] in June of 2021 with a right second toe surgical site wound, and decided that conservative management was best given his wound healing trajectory.  Today he tells me that the current great toe wound has been present for a "couple of months".  He denies any resting pain, but does have pain when he ambulates at the toe.  He is receiving wound care, last visit yesterday.     Additionally, he tells me today that he has a new wound of the fourth toe of the left foot, about 1 week now.     Non-invasive exam 12/11/20: Right ABI: 0.7, monophasic waveforms Left ABI: 0.53, monophasic waveforms  CTA from 09/25/19 shows bilateral femoral popliteal disease, no significant inflow disease.   Past Medical History:  Diagnosis Date   Diabetes mellitus    Hypercholesteremia    Hypertension    Patella fracture    left   PVD (peripheral vascular disease) ([place])     Past Surgical History:  Procedure Laterality Date   AMPUTATION     AMPUTATION  11/28/2011   Procedure: AMPUTATION RAY;  Surgeon: [name], MD;  Location: [facility];  Service: Orthopedics;  Laterality: Left;  left 2nd toe amputation   AMPUTATION TOE Right 09/14/2019   Procedure: AMPUTATION TOE RIGHT SECOND TOE;  Surgeon: [name], [name];  Location: [facility];  Service: Podiatry;  Laterality: Right;   IR RADIOLOGIST EVAL & MGMT  09/27/2019   IR RADIOLOGIST EVAL & MGMT  12/18/2019   ORIF PATELLA Left 01/10/2019   Procedure: OPEN REDUCTION INTERNAL (ORIF) FIXATION LEFT PATELLA FRACTURE;  Surgeon: [name],  MD;  Location: [facility];  Service: Orthopedics;  Laterality: Left;    Allergies: Metformin hcl and Pravastatin sodium  Medications: Prior to Admission medications   Medication Sig Start Date End Date Taking? Authorizing Provider  acetaminophen (TYLENOL) 500 MG tablet Take 1,000 mg by mouth every 6 (six) hours as needed.    [provider]  aspirin EC 81 MG tablet Take 81 mg by mouth daily.    [provider]  B-D ULTRAFINE III SHORT PEN 31G X 8 MM MISC Inject into the skin 5 (five) times daily. 11/26/19   [provider]  clopidogrel (PLAVIX) 75 MG tablet Take 75 mg by mouth daily.    [provider]  collagenase (SANTYL) ointment Apply 1 application topically daily. 01/15/21   [name], [name]  diltiazem (CARDIZEM CD) 120 MG 24 hr capsule  03/16/20   [provider]  diltiazem (DILACOR XR) 120 MG 24 hr capsule Take 120 mg by mouth daily.    [provider]  doxycycline (VIBRA-TABS) 100 MG tablet Take 1 tablet (100 mg total) by mouth 2 (two) times daily. 01/15/21   [name], [name]  ezetimibe (ZETIA) 10 MG tablet Take 10 mg by mouth daily.    [provider]  glucose blood (FREESTYLE LITE) test strip TEST 3 TIMES A DAY E11.9    [provider]  Insulin Glargine-Lixisenatide (SOLIQUA) 100-33 UNT-MCG/ML SOPN Inject 33 Units into the skin daily.     [provider]  Lancets (FREESTYLE) lancets AS DIRECTED THREE TIMES A DAY    [provider]     No family history on file.  Social History   Socioeconomic History   Marital status: Married    Spouse name: Not on file   Number of children: Not on file   Years of education: Not on file   Highest education level: Not on file  Occupational History   Not on file  Tobacco Use   Smoking status: Never   Smokeless tobacco: Never  Substance and Sexual Activity   Alcohol use: No   Drug use: No   Sexual activity: Not on file  Other  Topics Concern   Not on file  Social History Narrative   Not on file   Social Determinants of Health   Financial Resource Strain: Not on file  Food Insecurity: Not on file  Transportation Needs: Not on file  Physical Activity: Not on file  Stress: Not on file  Social Connections: Not on file      Review of Systems: A 12 point ROS discussed and pertinent positives are indicated in the HPI above.  All other systems are negative.  Review of Systems  Vital Signs: BP 137/64 (BP Location: Right Arm)   Pulse 91   SpO2 96%   Physical Exam General: 75 yo male appearing stated age.  Well-developed, well-nourished.  No distress. HEENT: Atraumatic, normocephalic.  Conjugate gaze, extra-ocular motor intact. No scleral icterus or scleral injection. No lesions on external ears,   Neck: Symmetric with no goiter enlargement.  Chest/Lungs:  Symmetric chest with inspiration/expiration.  No labored breathing.  Clear to auscultation with no wheezes, rhonchi, or rales.  Heart:  RRR, with no third heart sounds appreciated. No JVD appreciated.  Abdomen:  Soft, NT/ND, with + bowel sounds.   Genito-urinary: Deferred Neurologic: Alert & Oriented to person, place, and time.   Normal affect and insight.  Appropriate questions.  Moving all 4 extremities with gross sensory intact.  Pulse Exam:  Right bruit appreciated.  Palpable radial pulses.  Doppler + right and left DP/PT, with monophasic audible signal. 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  Hairless, with dry, scaling skin bilateral.  No significant erythema to suggest infection.  Dependent rubor.    Mallampati Score:     Imaging: No results found.  Labs:  CBC: No results for input(s): WBC, HGB, HCT, PLT in the last 8760 hours.  COAGS: No results for input(s): INR, APTT in the last 8760 hours.  BMP: No results for input(s): NA, K, CL, CO2, GLUCOSE, BUN, CALCIUM, CREATININE,  GFRNONAA, GFRAA in the last 8760 hours.  Invalid input(s): CMP  LIVER FUNCTION TESTS: No results for input(s): BILITOT, AST, ALT, ALKPHOS, PROT, ALBUMIN in the last 8760 hours.  TUMOR MARKERS: No results for input(s): AFPTM, CEA, CA199, CHROMGRNA in the last 8760 hours.  Assessment and Plan:  Assessment:  Zachary [name] is a very pleasant 75 yo gentleman presenting with new right great toe diabetic foot wound, [place] 5 CLI/CLTI, as well as a new left 4th toe wound.   Non-invasive lower extremity exam and imaging work-up shows evidence of bilateral femoral popliteal disease and tibial disease.   Today we refreshed our discussion regarding the natural history of CLI and PAD, as well as had abbreviated discussion regarding anatomy, pathology/pathophysiology.  The indications for treatment supported by updated guidelines1, 2 were discussed.  Regarding endovascular options, specific risks discussed include: bleeding, infection, contrast reaction, renal injury/nephropathy, arterial injury/dissection, need for additional procedure/surgery, worsening symptoms/tissue including limb loss, cardiopulmonary collapse, death.    Regarding medical management, maximal medical therapy for reduction of risk factors is indicated as recommended by updated AHA guidelines1.  This includes anti-platelet medication, tight blood glucose control to a HbA1c < 7, tight blood pressure control, maximum-dose HMG-CoA reductase inhibitor, and smoking cessation.     We did discuss the presence of bilateral foot wounds, with the right great toe existing longer, and this would be the first wound to treat.   After our discussion, he would like to proceed with treatment.  If the left side requires treatment after recovery from the right treatment, we will discuss.   Plan: -Plan is to proceed with aorto-peripheral angiogram and possible intervention, targeting the right lower extremity, from a left to right approach given the  multi-level fem-pop and tibial disease.  With Dr. [name] at [facility], first available.  -Continue maximal medical therapy for cardiovascular risk reduction, including anti-platelet therapy. He is currently taking both ASA and plavix - Continue wound care.  - We will discuss the need for possible carotid duplex at his next visit  ___________________________________________________________________   1Morley Kos MD, et al. 2016 AHA/ACC Guideline on the Management of Patients With Lower Extremity Peripheral Artery Disease: Executive Summary: A Report of the American College of Cardiology/American Heart Association Task Force on Clinical Practice Guidelines. J Am Coll Cardiol. 2017 Mar 21;69(11):1465-1508. doi: 10.1016/j.jacc.2016.11.008.   2 - Norgren L, et al. TASC II Working Group. Inter-society consensus for the management of peripheral arterial disease. Int Tressia Miners. 2007 Jun;26(2):81-157. Review. PubMed PMID: 38101751  3 - Hingorani A, et al. The management of diabetic foot: A clinical practice guideline by the Society for Vascular Surgery in collaboration with the [place] and the Society  for Vascular Medicine. J Vasc Surg. 2016 Feb;63(2 Suppl):3S-21S. doi: 10.1016/j.jvs.2015.10.003. PubMed PMID: 02585277.  4 - Corinna Gab, Saab FA, Luberta Mutter, Grant Ruts, Ewell Poe, Driver VR, [place], Lookstein R, van den Baldemar Lenis, Jaff Zachary, Guadalupe Dawn, Henao S, AlMahameed A, Katzen B. Digital Subtraction Angiography Prior to an Amputation for Critical Limb Ischemia (CLI): An Expert Recommendation Statement From the CLI Global Society to Optimize Limb Salvage. J Endovasc Ther. 2020 Aug;27(4):540-546. doi: 10.1177/1526602820928590. Epub 2020 May 29. PMID: 82423536.    Electronically Signed: Corrie [name] 02/11/2021, 3:44 PM   I spent a total of    40 Minutes in face to face in clinical consultation, greater than 50% of which was counseling/coordinating care for lower extremity PAD,  bilateral diabetic foot wounds/CLI, possible treatment of the right with angiogram and intervention

## 2021-02-12 ENCOUNTER — Ambulatory Visit: Payer: Medicare Other | Admitting: Podiatry

## 2021-02-15 ENCOUNTER — Other Ambulatory Visit: Payer: Self-pay | Admitting: Radiology

## 2021-02-23 ENCOUNTER — Other Ambulatory Visit (HOSPITAL_COMMUNITY): Payer: Self-pay | Admitting: Interventional Radiology

## 2021-02-23 ENCOUNTER — Telehealth (HOSPITAL_COMMUNITY): Payer: Self-pay

## 2021-02-23 DIAGNOSIS — I739 Peripheral vascular disease, unspecified: Secondary | ICD-10-CM

## 2021-02-23 NOTE — Telephone Encounter (Signed)
Called to schedule procedure with Dr. [name], no answer, left vm. AW

## 2021-02-24 ENCOUNTER — Ambulatory Visit (INDEPENDENT_AMBULATORY_CARE_PROVIDER_SITE_OTHER): Payer: Medicare Other | Admitting: Podiatry

## 2021-02-24 ENCOUNTER — Other Ambulatory Visit: Payer: Self-pay

## 2021-02-24 DIAGNOSIS — L97512 Non-pressure chronic ulcer of other part of right foot with fat layer exposed: Secondary | ICD-10-CM | POA: Diagnosis not present

## 2021-02-24 DIAGNOSIS — I739 Peripheral vascular disease, unspecified: Secondary | ICD-10-CM

## 2021-02-24 DIAGNOSIS — E11621 Type 2 diabetes mellitus with foot ulcer: Secondary | ICD-10-CM

## 2021-02-26 ENCOUNTER — Encounter: Payer: Self-pay | Admitting: Podiatry

## 2021-02-26 NOTE — Progress Notes (Signed)
Subjective:  Patient ID: Zachary [name], male    DOB: 02/18/1946,  MRN: [id number]  Chief Complaint  Patient presents with   Wound Check    Wound check     75 y.o. male presents for wound care.  Patient presents for follow-up of right hallux wound.  Patient states it looks about the same.  Patient scheduled to undergo surgery Wednesday.  We will follow-up after and begin graft application.  Review of Systems: Negative except as noted in the HPI. Denies N/V/F/Ch.  Past Medical History:  Diagnosis Date   Diabetes mellitus    Hypercholesteremia    Hypertension    Patella fracture    left   PVD (peripheral vascular disease) ([place])     Current Outpatient Medications:    acetaminophen (TYLENOL) 500 MG tablet, Take 1,000 mg by mouth daily., Disp: , Rfl:    aspirin EC 81 MG tablet, Take 81 mg by mouth daily., Disp: , Rfl:    B-D ULTRAFINE III SHORT PEN 31G X 8 MM MISC, Inject into the skin 5 (five) times daily., Disp: , Rfl:    calcium carbonate (TUMS - DOSED IN MG ELEMENTAL CALCIUM) 500 MG chewable tablet, Chew 1,000 mg by mouth daily as needed for indigestion or heartburn., Disp: , Rfl:    clopidogrel (PLAVIX) 75 MG tablet, Take 75 mg by mouth daily., Disp: , Rfl:    collagenase (SANTYL) ointment, Apply 1 application topically daily. (Patient not taking: Reported on 02/25/2021), Disp: 30 g, Rfl: 5   dapagliflozin propanediol (FARXIGA) 5 MG TABS tablet, Take 5 mg by mouth daily., Disp: , Rfl:    diltiazem (CARDIZEM CD) 120 MG 24 hr capsule, Take 120 mg by mouth daily., Disp: , Rfl:    doxycycline (VIBRA-TABS) 100 MG tablet, Take 1 tablet (100 mg total) by mouth 2 (two) times daily. (Patient not taking: Reported on 02/25/2021), Disp: 14 tablet, Rfl: 0   glucose blood (FREESTYLE LITE) test strip, TEST 3 TIMES A DAY E11.9, Disp: , Rfl:    Insulin Glargine-Lixisenatide (SOLIQUA) 100-33 UNT-MCG/ML SOPN, Inject 50 Units into the skin daily., Disp: , Rfl:    Lancets (FREESTYLE) lancets, AS  DIRECTED THREE TIMES A DAY, Disp: , Rfl:   Social History   Tobacco Use  Smoking Status Never  Smokeless Tobacco Never    Allergies  Allergen Reactions   Metformin Hcl Other (See Comments)    Unknown reaction   Pravastatin Sodium Other (See Comments)    Muscle pain    Objective:  There were no vitals filed for this visit. There is no height or weight on file to calculate BMI. Constitutional Well developed. Well nourished.  Vascular Dorsalis pedis pulses non palpable bilaterally. Posterior tibial pulses non palpable bilaterally. Capillary refill normal to all digits.  No cyanosis or clubbing noted. Pedal hair growth normal.  Neurologic Normal speech. Oriented to person, place, and time. Protective sensation absent  Dermatologic Wound Location: Right hallux hallux ulceration with fat layer exposed.  No malodor no probing down to bone. Wound Base: Mixed Granular/Fibrotic Peri-wound: Reddened Exudate: Scant/small amount Serosanguinous exudate Wound Measurements: -See below  Orthopedic: No pain to palpation either foot.   Radiographs: None Assessment:   1. PAD (peripheral artery disease) ([place])   2. Diabetic ulcer of toe of right foot associated with type 2 diabetes mellitus, with fat layer exposed ([place])      Plan:  Patient was evaluated and treated and all questions answered.  Ulcer right hallux ulceration with fat layer  exposed with history of peripheral artery disease -Debridement as below. -Dressed with Betadine wet-to-dry, DSD. -Continue off-loading with surgical shoe. -Patient will benefit from a vascular intervention prior to any kind of intervention including grafting. -We will be an ideal candidate for grafting once his restoration has slowed. -His ABIs PVRs were reviewed from 2 months ago which shows peripheral artery disease to both lower extremity.  He is scheduled see Dr. Damita Dunnings tomorrow -I will see him back again after and assess for synthetic  graft application as patient has failed greater than 4 weeks of conservative treatment and local wound care.  Procedure: Excisional Debridement of Wound Tool: Sharp chisel blade/tissue nipper Rationale: Removal of non-viable soft tissue from the wound to promote healing.  Anesthesia: none Pre-Debridement Wound Measurements: 2 cm x 1.5 cm x 0.3 cm  Post-Debridement Wound Measurements: 2.2 cm x 1.7 cm x 0.3 cm  Type of Debridement: Sharp Excisional Tissue Removed: Non-viable soft tissue Blood loss: Minimal (<50cc) Depth of Debridement: subcutaneous tissue. Technique: Sharp excisional debridement to bleeding, viable wound base.  Wound Progress: The wound is stagnant. Dressing: Dry, sterile, compression dressing. Disposition: Patient tolerated procedure well. Patient to return in 1 week for follow-up.  No follow-ups on file.

## 2021-03-02 ENCOUNTER — Other Ambulatory Visit: Payer: Self-pay | Admitting: Radiology

## 2021-03-03 ENCOUNTER — Ambulatory Visit (HOSPITAL_COMMUNITY)
Admission: RE | Admit: 2021-03-03 | Discharge: 2021-03-03 | Disposition: A | Discharge status: Home / Self Care | Payer: Medicare Other | Source: Ambulatory Visit | Attending: Interventional Radiology | Admitting: Interventional Radiology

## 2021-03-03 ENCOUNTER — Encounter (HOSPITAL_COMMUNITY): Payer: Self-pay

## 2021-03-03 ENCOUNTER — Other Ambulatory Visit (HOSPITAL_COMMUNITY): Payer: Self-pay | Admitting: Interventional Radiology

## 2021-03-03 ENCOUNTER — Other Ambulatory Visit: Payer: Self-pay

## 2021-03-03 DIAGNOSIS — L97519 Non-pressure chronic ulcer of other part of right foot with unspecified severity: Secondary | ICD-10-CM | POA: Diagnosis not present

## 2021-03-03 DIAGNOSIS — E11621 Type 2 diabetes mellitus with foot ulcer: Secondary | ICD-10-CM | POA: Insufficient documentation

## 2021-03-03 DIAGNOSIS — I739 Peripheral vascular disease, unspecified: Secondary | ICD-10-CM

## 2021-03-03 DIAGNOSIS — I70235 Atherosclerosis of native arteries of right leg with ulceration of other part of foot: Secondary | ICD-10-CM | POA: Insufficient documentation

## 2021-03-03 DIAGNOSIS — E1151 Type 2 diabetes mellitus with diabetic peripheral angiopathy without gangrene: Secondary | ICD-10-CM | POA: Insufficient documentation

## 2021-03-03 HISTORY — PX: IR INTRAVASCULAR ULTRASOUND NON CORONARY: IMG6085

## 2021-03-03 HISTORY — PX: IR ANGIOGRAM EXTREMITY RIGHT: IMG652

## 2021-03-03 HISTORY — PX: IR TIB-PERO ART ATHEREC INC PTA MOD SED: IMG2314

## 2021-03-03 HISTORY — PX: IR ANGIOGRAM EXTREMITY BILATERAL: IMG653

## 2021-03-03 HISTORY — PX: IR FEM POP ART PTA MOD SED: IMG2309

## 2021-03-03 HISTORY — PX: IR US GUIDE VASC ACCESS LEFT: IMG2389

## 2021-03-03 LAB — CBC
HCT: 45.9 % (ref 39.0–52.0)
Hemoglobin: 15.9 g/dL (ref 13.0–17.0)
MCH: 29.9 pg (ref 26.0–34.0)
MCHC: 34.6 g/dL (ref 30.0–36.0)
MCV: 86.4 fL (ref 80.0–100.0)
Platelets: 214 10*3/uL (ref 150–400)
RBC: 5.31 MIL/uL (ref 4.22–5.81)
RDW: 13.1 % (ref 11.5–15.5)
WBC: 9.9 10*3/uL (ref 4.0–10.5)
nRBC: 0 % (ref 0.0–0.2)

## 2021-03-03 LAB — BASIC METABOLIC PANEL
Anion gap: 11 (ref 5–15)
BUN: 25 mg/dL — ABNORMAL HIGH (ref 8–23)
CO2: 22 mmol/L (ref 22–32)
Calcium: 8.7 mg/dL — ABNORMAL LOW (ref 8.9–10.3)
Chloride: 103 mmol/L (ref 98–111)
Creatinine, Ser: 1.87 mg/dL — ABNORMAL HIGH (ref 0.61–1.24)
GFR, Estimated: 37 mL/min — ABNORMAL LOW (ref >60)
Glucose, Bld: 194 mg/dL — ABNORMAL HIGH (ref 70–99)
Potassium: 4.1 mmol/L (ref 3.5–5.1)
Sodium: 136 mmol/L (ref 135–145)

## 2021-03-03 LAB — PROTIME-INR
INR: 1 (ref 0.8–1.2)
Prothrombin Time: 13 seconds (ref 11.4–15.2)

## 2021-03-03 LAB — GLUCOSE, CAPILLARY
Glucose-Capillary: 186 mg/dL — ABNORMAL HIGH (ref 70–99)
Glucose-Capillary: 154 mg/dL — ABNORMAL HIGH (ref 70–99)

## 2021-03-03 MED ORDER — CLOPIDOGREL BISULFATE 300 MG PO TABS
ORAL_TABLET | ORAL | Status: AC
  Administered 2021-03-03: 300 mg via ORAL
  Filled 2021-03-03: qty 1

## 2021-03-03 MED ORDER — IODIXANOL 320 MG/ML IV SOLN
100.0000 mL | Freq: Once | INTRAVENOUS | Status: AC | PRN
  Administered 2021-03-03: 16 mL via INTRAVENOUS

## 2021-03-03 MED ORDER — IODIXANOL 320 MG/ML IV SOLN
100.0000 mL | Freq: Once | INTRAVENOUS | Status: AC | PRN
  Administered 2021-03-03: 80 mL via INTRAVENOUS

## 2021-03-03 MED ORDER — LIDOCAINE HCL (PF) 1 % IJ SOLN
INTRAMUSCULAR | Status: AC | PRN
  Administered 2021-03-03: 10 mL

## 2021-03-03 MED ORDER — CLOPIDOGREL BISULFATE 75 MG PO TABS: 300.0000 mg | ORAL_TABLET | Freq: Once | ORAL | Status: AC

## 2021-03-03 MED ORDER — MIDAZOLAM HCL 2 MG/2ML IJ SOLN
INTRAMUSCULAR | Status: AC
  Filled 2021-03-03: qty 4

## 2021-03-03 MED ORDER — SODIUM CHLORIDE 0.9 % IV SOLN: INTRAVENOUS | Status: DC

## 2021-03-03 MED ORDER — HEPARIN SODIUM (PORCINE) 1000 UNIT/ML IJ SOLN
INTRAMUSCULAR | Status: AC
  Filled 2021-03-03: qty 1

## 2021-03-03 MED ORDER — ASPIRIN 325 MG PO TABS: 650.0000 mg | ORAL_TABLET | Freq: Once | ORAL | Status: AC

## 2021-03-03 MED ORDER — FENTANYL CITRATE (PF) 100 MCG/2ML IJ SOLN
INTRAMUSCULAR | Status: AC | PRN
  Administered 2021-03-03: 25 ug via INTRAVENOUS
  Administered 2021-03-03: 50 ug via INTRAVENOUS
  Administered 2021-03-03 (×2): 25 ug via INTRAVENOUS

## 2021-03-03 MED ORDER — FENTANYL CITRATE (PF) 100 MCG/2ML IJ SOLN
INTRAMUSCULAR | Status: AC
  Filled 2021-03-03: qty 4

## 2021-03-03 MED ORDER — LIDOCAINE HCL 1 % IJ SOLN
INTRAMUSCULAR | Status: AC
  Filled 2021-03-03: qty 20

## 2021-03-03 MED ORDER — MIDAZOLAM HCL 2 MG/2ML IJ SOLN
INTRAMUSCULAR | Status: AC | PRN
  Administered 2021-03-03: 1 mg via INTRAVENOUS
  Administered 2021-03-03 (×2): .5 mg via INTRAVENOUS

## 2021-03-03 MED ORDER — HEPARIN SODIUM (PORCINE) 1000 UNIT/ML IJ SOLN
INTRAMUSCULAR | Status: AC | PRN
  Administered 2021-03-03: 8000 [IU] via INTRAVENOUS

## 2021-03-03 MED ORDER — ASPIRIN 325 MG PO TABS
ORAL_TABLET | ORAL | Status: AC
  Administered 2021-03-03: 650 mg via ORAL
  Filled 2021-03-03: qty 2

## 2021-03-03 NOTE — Sedation Documentation (Signed)
ACT 257 Doctor [name] notified.

## 2021-03-03 NOTE — H&P (Signed)
Chief Complaint: Patient was seen in consultation today for Ao-peripheral arteriogram with intervention at the request of Dr Keith Rake  Supervising Physician: Corrie Mckusick  Patient Status: [facility] - Out-pt  History of Present Illness: Zachary [name] is a 75 y.o. 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   Was seen in consultation with Dr [name] regarding revascularization  02/11/21 note:  Plan: -Plan is to proceed with aorto-peripheral angiogram and possible intervention, targeting the right lower extremity, from a left to right approach given the multi-level fem-pop and tibial disease.  With Dr. [name] at [facility], first available.  -Continue maximal medical therapy for cardiovascular risk reduction, including anti-platelet therapy. He is currently taking both ASA and plavix - Continue wound care.  - We will discuss the need for possible carotid duplex at his next visit   Past Medical History:  Diagnosis Date   Diabetes mellitus    Hypercholesteremia    Hypertension    Patella fracture    left   PVD (peripheral vascular disease) ([place])     Past Surgical History:  Procedure Laterality Date   AMPUTATION     AMPUTATION  11/28/2011   Procedure: AMPUTATION RAY;  Surgeon: [name], MD;  Location: [facility];  Service: Orthopedics;  Laterality: Left;  left 2nd toe amputation   AMPUTATION TOE Right 09/14/2019   Procedure: AMPUTATION TOE RIGHT SECOND TOE;  Surgeon: [name], [name];  Location: [facility];  Service: Podiatry;  Laterality: Right;    IR RADIOLOGIST EVAL & MGMT  09/27/2019   IR RADIOLOGIST EVAL & MGMT  12/18/2019   IR RADIOLOGIST EVAL & MGMT  02/11/2021   ORIF PATELLA Left 01/10/2019   Procedure: OPEN REDUCTION INTERNAL (ORIF) FIXATION LEFT PATELLA FRACTURE;  Surgeon: [name], MD;  Location: [facility];  Service: Orthopedics;  Laterality: Left;    Allergies: Metformin hcl and Pravastatin sodium  Medications: Prior to Admission medications   Medication Sig Start Date End Date Taking? Authorizing Provider  acetaminophen (TYLENOL) 500 MG tablet Take 1,000 mg by mouth daily.   Yes [provider]  aspirin EC 81 MG tablet Take 81 mg by mouth daily.   Yes [provider]  calcium carbonate (TUMS - DOSED IN MG ELEMENTAL CALCIUM) 500 MG chewable tablet Chew 1,000 mg by mouth daily as needed for indigestion or heartburn.   Yes [provider]  clopidogrel (PLAVIX) 75 MG tablet Take 75 mg by mouth daily.   Yes [provider]  dapagliflozin propanediol (FARXIGA) 5 MG TABS tablet Take 5 mg by mouth daily.   Yes [provider]  diltiazem (CARDIZEM CD) 120 MG 24 hr capsule Take 120 mg by mouth daily. 03/16/20  Yes [provider]  Insulin Glargine-Lixisenatide (SOLIQUA) 100-33 UNT-MCG/ML SOPN Inject 50 Units into the skin daily.   Yes [provider]  B-D ULTRAFINE III SHORT PEN 31G X 8 MM MISC Inject into the skin 5 (five) times daily. 11/26/19   [provider]  collagenase (SANTYL) ointment Apply 1 application topically daily. Patient not taking:  Reported on 02/25/2021 01/15/21   [name], [name]  doxycycline (VIBRA-TABS) 100 MG tablet Take 1 tablet (100 mg total) by mouth 2 (two) times daily. Patient not taking: Reported on 02/25/2021 01/15/21   [name], [name]  glucose blood (FREESTYLE LITE) test strip TEST 3 TIMES A DAY E11.9    [provider]  Lancets (FREESTYLE) lancets AS DIRECTED THREE TIMES A DAY    [provider]     History reviewed. No pertinent family history.  Social History   Socioeconomic History   Marital status: Married    Spouse name: Not on file   Number of children: Not on file   Years of education: Not on file   Highest education level: Not on file  Occupational History   Not on file  Tobacco Use   Smoking status: Never   Smokeless tobacco: Never  Substance and Sexual Activity   Alcohol use: No   Drug use: No   Sexual activity: Not on file  Other Topics Concern   Not on file  Social History Narrative   Not on file   Social Determinants of Health   Financial Resource Strain: Not on file  Food Insecurity: Not on file  Transportation Needs: Not on file  Physical Activity: Not on file  Stress: Not on file  Social Connections: Not on file     Review of Systems: A 12 point ROS discussed and pertinent positives are indicated in the HPI above.  All other systems are negative.  Review of Systems  Constitutional:  Negative for activity change, fatigue and fever.  Respiratory:  Negative for cough and shortness of breath.   Gastrointestinal:  Negative for abdominal pain.  Psychiatric/Behavioral:  Negative for behavioral problems and confusion.    Vital Signs: BP (!) 141/64   Pulse 87   Temp 97.7 F (36.5 C) (Oral)   Resp 18   Ht 5\' 8"  (1.727 m)   Wt 200 lb (90.7 kg)   SpO2 98%   BMI 30.41 kg/m   Physical Exam Vitals reviewed.  HENT:     Mouth/Throat:     Mouth: Mucous membranes are moist.  Cardiovascular:     Rate and Rhythm: Normal rate and regular rhythm.     Heart sounds: Murmur heard.  Pulmonary:     Effort: Pulmonary effort is normal.     Breath sounds: Normal breath sounds.  Abdominal:     Palpations: Abdomen is soft.  Musculoskeletal:        General: Normal range of motion.     Comments: Right foot pain  Skin:    General: Skin is warm.  Neurological:     Mental Status: He is alert and oriented to person, place, and time.   Psychiatric:        Behavior: Behavior normal.    Imaging: IR Radiologist Eval & Mgmt  Result Date: 02/11/2021 Please refer to notes tab for details about interventional procedure. (Op Note)   Labs:  CBC: Recent Labs    03/03/21 0711  WBC 9.9  HGB 15.9  HCT 45.9  PLT 214    COAGS: Recent Labs    03/03/21 0711  INR 1.0    BMP: Recent Labs    03/03/21 0711  NA 136  K 4.1  CL 103  CO2 22  GLUCOSE 194*  BUN 25*  CALCIUM 8.7*  CREATININE 1.87*  GFRNONAA 37*    LIVER FUNCTION TESTS: No results for input(s): BILITOT, AST, ALT,  ALKPHOS, PROT, ALBUMIN in the last 8760 hours.  TUMOR MARKERS: No results for input(s): AFPTM, CEA, CA199, CHROMGRNA in the last 8760 hours.  Assessment and Plan:  Non healing diabetic foot ulcer Rt great toe and Rt 4th toe Has been seen in consultation with Dr [name] Scheduled now for Aorto- peripheral arteriogram- targeting right lower extremity- with possible intervention Risks and benefits of Aorto- peripheral arteriogram- targeting right lower extremity- with possible intervention were discussed with the patient including, but not limited to bleeding, infection, vascular injury or contrast induced renal failure.  This interventional procedure involves the use of X-rays and because of the nature of the planned procedure, it is possible that we will have prolonged use of X-ray fluoroscopy.  Potential radiation risks to you include (but are not limited to) the following: - A slightly elevated risk for cancer  several years later in life. This risk is typically less than 0.5% percent. This risk is low in comparison to the normal incidence of human cancer, which is 33% for women and 50% for men according to the [place]. - Radiation induced injury can include skin redness, resembling a rash, tissue breakdown / ulcers and hair loss (which can be temporary or permanent).   The likelihood of either of these occurring  depends on the difficulty of the procedure and whether you are sensitive to radiation due to previous procedures, disease, or genetic conditions.   IF your procedure requires a prolonged use of radiation, you will be notified and given written instructions for further action.  It is your responsibility to monitor the irradiated area for the 2 weeks following the procedure and to notify your physician if you are concerned that you have suffered a radiation induced injury.    All of the patient's questions were answered, patient is agreeable to proceed.  Consent signed and in chart.   Thank you for this interesting consult.  I greatly enjoyed meeting Zachary [name] and look forward to participating in their care.  A copy of this report was sent to the requesting provider on this date.  Electronically Signed: Lavonia Drafts, PA-C 03/03/2021, 8:46 AM   I spent a total of    25 Minutes in face to face in clinical consultation, greater than 50% of which was counseling/coordinating care for RLE arteriogram and intervention

## 2021-03-03 NOTE — Discharge Instructions (Signed)
Femoral Site Care This sheet gives you information about how to care for yourself after your procedure. Your health care provider may also give you more specific instructions. If you have problems or questions, contact your health care provider. What can I expect after the procedure?  After the procedure, it is common to have: Bruising that usually fades within 1-2 weeks. Tenderness at the site. Follow these instructions at home: Wound care Follow instructions from your health care provider about how to take care of your insertion site. Make sure you: Wash your hands with soap and water before you change your bandage (dressing). If soap and water are not available, use hand sanitizer. Remove your dressing as told by your health care provider. In 24 hours Do not take baths, swim, or use a hot tub until your health care provider approves. You may shower 24-48 hours after the procedure or as told by your health care provider. Gently wash the site with plain soap and water. Pat the area dry with a clean towel. Do not rub the site. This may cause bleeding. Do not apply powder or lotion to the site. Keep the site clean and dry. Check your femoral site every day for signs of infection. Check for: Redness, swelling, or pain. Fluid or blood. Warmth. Pus or a bad smell. Activity For the first 2-3 days after your procedure, or as long as directed: Avoid climbing stairs as much as possible. Do not squat. Do not lift anything that is heavier than 10 lb (4.5 kg), or the limit that you are told, until your health care provider says that it is safe. For 5 days Rest as directed. Avoid sitting for a long time without moving. Get up to take short walks every 1-2 hours. Do not drive for 24 hours if you were given a medicine to help you relax (sedative). General instructions Take over-the-counter and prescription medicines only as told by your health care provider. Keep all follow-up visits as told by  your health care provider. This is important. Contact a health care provider if you have: A fever or chills. You have redness, swelling, or pain around your insertion site. Get help right away if: The catheter insertion area swells very fast. You pass out. You suddenly start to sweat or your skin gets clammy. The catheter insertion area is bleeding, and the bleeding does not stop when you hold steady pressure on the area. The area near or just beyond the catheter insertion site becomes pale, cool, tingly, or numb. These symptoms may represent a serious problem that is an emergency. Do not wait to see if the symptoms will go away. Get medical help right away. Call your local emergency services (911 in the [place] Do not drive yourself to the hospital. Summary After the procedure, it is common to have bruising that usually fades within 1-2 weeks. Check your femoral site every day for signs of infection. Do not lift anything that is heavier than 10 lb (4.5 kg), or the limit that you are told, until your health care provider says that it is safe. This information is not intended to replace advice given to you by your health care provider. Make sure you discuss any questions you have with your health care provider. Document Revised: 04/10/2017 Document Reviewed: 04/10/2017 Elsevier Patient Education  2020 [employer] 

## 2021-03-03 NOTE — Procedures (Addendum)
Interventional Radiology Procedure Note  Procedure:   US guided left CFA access.  Pelvic, RLE angiogram. IVUS Treatment of right SFA/pop mx focal disease with DAART therapy Angioseal for hemostasis .  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  Recommendations:  - 4 hours leg straight - continue maximal medical therapy - routine wound care - Advance diet - DC when goals met 4 hrs - Do not submerge for 7 days - hold metformin x 48 hrs - Follow up with Dr. [name] in ~ 4 weeks in clinic  Signed,  Dulcy Fanny. [name], DO

## 2021-03-08 LAB — POCT ACTIVATED CLOTTING TIME: Activated Clotting Time: 257 seconds

## 2021-03-12 ENCOUNTER — Other Ambulatory Visit: Payer: Self-pay

## 2021-03-12 ENCOUNTER — Ambulatory Visit (INDEPENDENT_AMBULATORY_CARE_PROVIDER_SITE_OTHER): Payer: Medicare Other | Admitting: Podiatry

## 2021-03-12 DIAGNOSIS — E11621 Type 2 diabetes mellitus with foot ulcer: Secondary | ICD-10-CM | POA: Diagnosis not present

## 2021-03-12 DIAGNOSIS — L97512 Non-pressure chronic ulcer of other part of right foot with fat layer exposed: Secondary | ICD-10-CM

## 2021-03-15 ENCOUNTER — Telehealth: Payer: Self-pay | Admitting: *Deleted

## 2021-03-17 ENCOUNTER — Ambulatory Visit (INDEPENDENT_AMBULATORY_CARE_PROVIDER_SITE_OTHER): Payer: Medicare Other | Admitting: Podiatry

## 2021-03-17 ENCOUNTER — Other Ambulatory Visit: Payer: Self-pay

## 2021-03-17 ENCOUNTER — Encounter: Payer: Self-pay | Admitting: Podiatry

## 2021-03-17 DIAGNOSIS — L97512 Non-pressure chronic ulcer of other part of right foot with fat layer exposed: Secondary | ICD-10-CM | POA: Diagnosis not present

## 2021-03-17 DIAGNOSIS — E11621 Type 2 diabetes mellitus with foot ulcer: Secondary | ICD-10-CM

## 2021-03-17 NOTE — Progress Notes (Signed)
Subjective:  Patient ID: Zachary [name], male    DOB: 08/20/1945,  MRN: [id number]  Chief Complaint  Patient presents with   Wound Check    Bilateral wound check     75 y.o. male presents for wound care.  Patient presents for follow-up of right hallux wound.  Patient states it looks about the same.  Patient has completed the angiogram with intervention.  Patient has a best flow to the right lower extremity as he can get.  At this time he is ready to start graft application  Review of Systems: Negative except as noted in the HPI. Denies N/V/F/Ch.  Past Medical History:  Diagnosis Date   Diabetes mellitus    Hypercholesteremia    Hypertension    Patella fracture    left   PVD (peripheral vascular disease) ([place])     Current Outpatient Medications:    acetaminophen (TYLENOL) 500 MG tablet, Take 1,000 mg by mouth daily., Disp: , Rfl:    aspirin EC 81 MG tablet, Take 81 mg by mouth daily., Disp: , Rfl:    B-D ULTRAFINE III SHORT PEN 31G X 8 MM MISC, Inject into the skin 5 (five) times daily., Disp: , Rfl:    calcium carbonate (TUMS - DOSED IN MG ELEMENTAL CALCIUM) 500 MG chewable tablet, Chew 1,000 mg by mouth daily as needed for indigestion or heartburn., Disp: , Rfl:    clopidogrel (PLAVIX) 75 MG tablet, Take 75 mg by mouth daily., Disp: , Rfl:    collagenase (SANTYL) ointment, Apply 1 application topically daily. (Patient not taking: Reported on 02/25/2021), Disp: 30 g, Rfl: 5   dapagliflozin propanediol (FARXIGA) 5 MG TABS tablet, Take 5 mg by mouth daily., Disp: , Rfl:    diltiazem (CARDIZEM CD) 120 MG 24 hr capsule, Take 120 mg by mouth daily., Disp: , Rfl:    doxycycline (VIBRA-TABS) 100 MG tablet, Take 1 tablet (100 mg total) by mouth 2 (two) times daily. (Patient not taking: Reported on 02/25/2021), Disp: 14 tablet, Rfl: 0   glucose blood (FREESTYLE LITE) test strip, TEST 3 TIMES A DAY E11.9, Disp: , Rfl:    Insulin Glargine-Lixisenatide (SOLIQUA) 100-33 UNT-MCG/ML SOPN, Inject  50 Units into the skin daily., Disp: , Rfl:    Lancets (FREESTYLE) lancets, AS DIRECTED THREE TIMES A DAY, Disp: , Rfl:   Social History   Tobacco Use  Smoking Status Never  Smokeless Tobacco Never    Allergies  Allergen Reactions   Metformin Hcl Other (See Comments)    Unknown reaction   Pravastatin Sodium Other (See Comments)    Muscle pain    Objective:  There were no vitals filed for this visit. There is no height or weight on file to calculate BMI. Constitutional Well developed. Well nourished.  Vascular Dorsalis pedis pulses non palpable bilaterally. Posterior tibial pulses non palpable bilaterally. Capillary refill normal to all digits.  No cyanosis or clubbing noted. Pedal hair growth normal.  Neurologic Normal speech. Oriented to person, place, and time. Protective sensation absent  Dermatologic Wound Location: Right hallux hallux ulceration with fat layer exposed.  No malodor no probing down to bone. Wound Base: Mixed Granular/Fibrotic Peri-wound: Reddened Exudate: Scant/small amount Serosanguinous exudate Wound Measurements: -See below  Orthopedic: No pain to palpation either foot.   Radiographs: None Assessment:   1. Diabetic ulcer of toe of right foot associated with type 2 diabetes mellitus, with fat layer exposed ([place])       Plan:  Patient was evaluated and treated  and all questions answered.  Ulcer right hallux ulceration with fat layer exposed with history of peripheral artery disease -Debridement as below. -Dressed with Betadine wet-to-dry, DSD. -Continue off-loading with surgical shoe. -Patient had undergone vascular intervention by Dr. [name] and has best blood flow to the right lower extremity to heal the wounds. -We will continue to do weekly graft application to resolve meant  -Application of synthetic skin graft/substitute  Name: [employer] Graft  Usage: 4 x 4 cm Graft was applied directly to the listed above wound site and secured  with Adaptic, kerlix, Ace bandage. Waste: No graft material was wasted and was applied in its entirety.   Procedure: Excisional Debridement of Wound Tool: Sharp chisel blade/tissue nipper Rationale: Removal of non-viable soft tissue from the wound to promote healing.  Anesthesia: none Pre-Debridement Wound Measurements: 2 cm x 1.5 cm x 0.3 cm  Post-Debridement Wound Measurements: 2.2 cm x 1.7 cm x 0.3 cm  Type of Debridement: Sharp Excisional Tissue Removed: Non-viable soft tissue Blood loss: Minimal (<50cc) Depth of Debridement: subcutaneous tissue. Technique: Sharp excisional debridement to bleeding, viable wound base.  Wound Progress: The wound is stagnant. Dressing: Dry, sterile, compression dressing. Disposition: Patient tolerated procedure well. Patient to return in 1 week for follow-up.  No follow-ups on file.

## 2021-03-22 NOTE — Telephone Encounter (Signed)
Error message

## 2021-03-23 ENCOUNTER — Encounter: Payer: Self-pay | Admitting: Podiatry

## 2021-03-23 NOTE — Progress Notes (Signed)
Subjective:  Patient ID: Zachary [name], male    DOB: 1946/02/22,  MRN: [id number]  Chief Complaint  Patient presents with   Wound Check    Ulcer of right foot     75 y.o. male presents for wound care.  Patient presents for follow-up of right hallux wound.  Patient states wound is improving.  He has been keeping it clean dry and intact.  He is here for his weekly graft application  Review of Systems: Negative except as noted in the HPI. Denies N/V/F/Ch.  Past Medical History:  Diagnosis Date   Diabetes mellitus    Hypercholesteremia    Hypertension    Patella fracture    left   PVD (peripheral vascular disease) ([place])     Current Outpatient Medications:    acetaminophen (TYLENOL) 500 MG tablet, Take 1,000 mg by mouth daily., Disp: , Rfl:    aspirin EC 81 MG tablet, Take 81 mg by mouth daily., Disp: , Rfl:    B-D ULTRAFINE III SHORT PEN 31G X 8 MM MISC, Inject into the skin 5 (five) times daily., Disp: , Rfl:    calcium carbonate (TUMS - DOSED IN MG ELEMENTAL CALCIUM) 500 MG chewable tablet, Chew 1,000 mg by mouth daily as needed for indigestion or heartburn., Disp: , Rfl:    clopidogrel (PLAVIX) 75 MG tablet, Take 75 mg by mouth daily., Disp: , Rfl:    collagenase (SANTYL) ointment, Apply 1 application topically daily. (Patient not taking: Reported on 02/25/2021), Disp: 30 g, Rfl: 5   dapagliflozin propanediol (FARXIGA) 5 MG TABS tablet, Take 5 mg by mouth daily., Disp: , Rfl:    diltiazem (CARDIZEM CD) 120 MG 24 hr capsule, Take 120 mg by mouth daily., Disp: , Rfl:    doxycycline (VIBRA-TABS) 100 MG tablet, Take 1 tablet (100 mg total) by mouth 2 (two) times daily. (Patient not taking: Reported on 02/25/2021), Disp: 14 tablet, Rfl: 0   glucose blood (FREESTYLE LITE) test strip, TEST 3 TIMES A DAY E11.9, Disp: , Rfl:    Insulin Glargine-Lixisenatide (SOLIQUA) 100-33 UNT-MCG/ML SOPN, Inject 50 Units into the skin daily., Disp: , Rfl:    Lancets (FREESTYLE) lancets, AS DIRECTED  THREE TIMES A DAY, Disp: , Rfl:   Social History   Tobacco Use  Smoking Status Never  Smokeless Tobacco Never    Allergies  Allergen Reactions   Metformin Hcl Other (See Comments)    Unknown reaction   Pravastatin Sodium Other (See Comments)    Muscle pain    Objective:  There were no vitals filed for this visit. There is no height or weight on file to calculate BMI. Constitutional Well developed. Well nourished.  Vascular Dorsalis pedis pulses non palpable bilaterally. Posterior tibial pulses non palpable bilaterally. Capillary refill normal to all digits.  No cyanosis or clubbing noted. Pedal hair growth normal.  Neurologic Normal speech. Oriented to person, place, and time. Protective sensation absent  Dermatologic Wound Location: Right hallux hallux ulceration with fat layer exposed.  No malodor no probing down to bone. Wound Base: Mixed Granular/Fibrotic Peri-wound: Reddened Exudate: Scant/small amount Serosanguinous exudate Wound Measurements: -See below  Orthopedic: No pain to palpation either foot.   Radiographs: None Assessment:   1. Diabetic ulcer of toe of right foot associated with type 2 diabetes mellitus, with fat layer exposed ([place])        Plan:  Patient was evaluated and treated and all questions answered.  Ulcer right hallux ulceration with fat layer exposed with history  of peripheral artery disease -Debridement as below. -Dressed with Betadine wet-to-dry, DSD. -Continue off-loading with surgical shoe. -Patient had undergone vascular intervention by Dr. [name] and has best blood flow to the right lower extremity to heal the wounds. -We will continue to do weekly graft application to resolve meant  -Application of synthetic skin graft/substitute  Name: [employer] Graft  Usage: 4 x 4 cm Graft was applied directly to the listed above wound site and secured with Adaptic, kerlix, Ace bandage. Waste: No graft material was wasted and was applied  in its entirety.   Procedure: Excisional Debridement of Wound Tool: Sharp chisel blade/tissue nipper Rationale: Removal of non-viable soft tissue from the wound to promote healing.  Anesthesia: none Pre-Debridement Wound Measurements: 1.8 cm x 1.3cm x 0.3 cm  Post-Debridement Wound Measurements: 2.0 cm x 1.5 cm x 0.3 cm  Type of Debridement: Sharp Excisional Tissue Removed: Non-viable soft tissue Blood loss: Minimal (<50cc) Depth of Debridement: subcutaneous tissue. Technique: Sharp excisional debridement to bleeding, viable wound base.  Wound Progress: The wound size is is decreasing. Dressing: Dry, sterile, compression dressing. Disposition: Patient tolerated procedure well. Patient to return in 1 week for follow-up.  No follow-ups on file.

## 2021-03-24 ENCOUNTER — Encounter: Payer: Self-pay | Admitting: Podiatry

## 2021-03-24 ENCOUNTER — Ambulatory Visit (INDEPENDENT_AMBULATORY_CARE_PROVIDER_SITE_OTHER): Payer: Medicare Other | Admitting: Podiatry

## 2021-03-24 ENCOUNTER — Other Ambulatory Visit: Payer: Self-pay

## 2021-03-24 DIAGNOSIS — E11621 Type 2 diabetes mellitus with foot ulcer: Secondary | ICD-10-CM

## 2021-03-24 DIAGNOSIS — L97512 Non-pressure chronic ulcer of other part of right foot with fat layer exposed: Secondary | ICD-10-CM

## 2021-03-24 NOTE — Progress Notes (Signed)
Subjective:  Patient ID: Zachary [name], male    DOB: 04-15-1945,  MRN: [id number]  Chief Complaint  Patient presents with   Diabetic Ulcer    Ulcer of right foot     75 y.o. male presents for wound care.  Patient presents for follow-up of right hallux wound.  Patient states wound is improving.  He has been keeping it clean dry and intact.  He is here for his weekly graft application  Review of Systems: Negative except as noted in the HPI. Denies N/V/F/Ch.  Past Medical History:  Diagnosis Date   Diabetes mellitus    Hypercholesteremia    Hypertension    Patella fracture    left   PVD (peripheral vascular disease) ([place])     Current Outpatient Medications:    acetaminophen (TYLENOL) 500 MG tablet, Take 1,000 mg by mouth daily., Disp: , Rfl:    aspirin EC 81 MG tablet, Take 81 mg by mouth daily., Disp: , Rfl:    B-D ULTRAFINE III SHORT PEN 31G X 8 MM MISC, Inject into the skin 5 (five) times daily., Disp: , Rfl:    calcium carbonate (TUMS - DOSED IN MG ELEMENTAL CALCIUM) 500 MG chewable tablet, Chew 1,000 mg by mouth daily as needed for indigestion or heartburn., Disp: , Rfl:    clopidogrel (PLAVIX) 75 MG tablet, Take 75 mg by mouth daily., Disp: , Rfl:    collagenase (SANTYL) ointment, Apply 1 application topically daily. (Patient not taking: Reported on 02/25/2021), Disp: 30 g, Rfl: 5   dapagliflozin propanediol (FARXIGA) 5 MG TABS tablet, Take 5 mg by mouth daily., Disp: , Rfl:    diltiazem (CARDIZEM CD) 120 MG 24 hr capsule, Take 120 mg by mouth daily., Disp: , Rfl:    doxycycline (VIBRA-TABS) 100 MG tablet, Take 1 tablet (100 mg total) by mouth 2 (two) times daily. (Patient not taking: Reported on 02/25/2021), Disp: 14 tablet, Rfl: 0   glucose blood (FREESTYLE LITE) test strip, TEST 3 TIMES A DAY E11.9, Disp: , Rfl:    Insulin Glargine-Lixisenatide (SOLIQUA) 100-33 UNT-MCG/ML SOPN, Inject 50 Units into the skin daily., Disp: , Rfl:    Lancets (FREESTYLE) lancets, AS DIRECTED  THREE TIMES A DAY, Disp: , Rfl:   Social History   Tobacco Use  Smoking Status Never  Smokeless Tobacco Never    Allergies  Allergen Reactions   Metformin Hcl Other (See Comments)    Unknown reaction   Pravastatin Sodium Other (See Comments)    Muscle pain    Objective:  There were no vitals filed for this visit. There is no height or weight on file to calculate BMI. Constitutional Well developed. Well nourished.  Vascular Dorsalis pedis pulses non palpable bilaterally. Posterior tibial pulses non palpable bilaterally. Capillary refill normal to all digits.  No cyanosis or clubbing noted. Pedal hair growth normal.  Neurologic Normal speech. Oriented to person, place, and time. Protective sensation absent  Dermatologic Wound Location: Right hallux hallux ulceration with fat layer exposed.  No malodor no probing down to bone. Wound Base: Mixed Granular/Fibrotic Peri-wound: Reddened Exudate: Scant/small amount Serosanguinous exudate Wound Measurements: -See below  Orthopedic: No pain to palpation either foot.   Radiographs: None Assessment:   1. Diabetic ulcer of toe of right foot associated with type 2 diabetes mellitus, with fat layer exposed ([place])         Plan:  Patient was evaluated and treated and all questions answered.  Ulcer right hallux ulceration with fat layer exposed with  history of peripheral artery disease -Debridement as below. -Dressed with Betadine wet-to-dry, DSD. -Continue off-loading with surgical shoe. -Patient had undergone vascular intervention by Dr. [name] and has best blood flow to the right lower extremity to heal the wounds. -We will continue to do weekly graft application to resolve meant  -Application of synthetic skin graft/substitute  Name: [employer] Graft  Usage: 2 x 2 cm Graft was applied directly to the listed above wound site and secured with Adaptic, kerlix, Ace bandage. Waste: No graft material was wasted and was  applied in its entirety.   Procedure: Excisional Debridement of Wound Tool: Sharp chisel blade/tissue nipper Rationale: Removal of non-viable soft tissue from the wound to promote healing.  Anesthesia: none Pre-Debridement Wound Measurements: 1.5 cm x 1.2cm x 0.3 cm  Post-Debridement Wound Measurements: 1.8 cm x 1.4 cm x 0.3 cm  Type of Debridement: Sharp Excisional Tissue Removed: Non-viable soft tissue Blood loss: Minimal (<50cc) Depth of Debridement: subcutaneous tissue. Technique: Sharp excisional debridement to bleeding, viable wound base.  Wound Progress: The wound size is is decreasing. Dressing: Dry, sterile, compression dressing. Disposition: Patient tolerated procedure well. Patient to return in 1 week for follow-up.  No follow-ups on file.

## 2021-03-26 ENCOUNTER — Other Ambulatory Visit: Payer: Self-pay | Admitting: Interventional Radiology

## 2021-03-26 DIAGNOSIS — S91331S Puncture wound without foreign body, right foot, sequela: Secondary | ICD-10-CM

## 2021-04-02 ENCOUNTER — Ambulatory Visit (INDEPENDENT_AMBULATORY_CARE_PROVIDER_SITE_OTHER): Payer: Medicare Other | Admitting: Podiatry

## 2021-04-02 ENCOUNTER — Encounter: Payer: Self-pay | Admitting: Podiatry

## 2021-04-02 ENCOUNTER — Other Ambulatory Visit: Payer: Self-pay

## 2021-04-02 DIAGNOSIS — I999 Unspecified disorder of circulatory system: Secondary | ICD-10-CM | POA: Diagnosis not present

## 2021-04-02 DIAGNOSIS — I96 Gangrene, not elsewhere classified: Secondary | ICD-10-CM

## 2021-04-02 DIAGNOSIS — E11621 Type 2 diabetes mellitus with foot ulcer: Secondary | ICD-10-CM | POA: Diagnosis not present

## 2021-04-02 DIAGNOSIS — L97512 Non-pressure chronic ulcer of other part of right foot with fat layer exposed: Secondary | ICD-10-CM | POA: Diagnosis not present

## 2021-04-02 NOTE — Progress Notes (Signed)
Subjective:  Patient ID: Zachary [name], male    DOB: 08/13/1945,  MRN: [id number]  Chief Complaint  Patient presents with   Diabetic Ulcer    Right hallux     75 y.o. male presents for wound care.  Patient presents for follow-up of right hallux wound.  Patient states wound is improving.  He has been keeping it clean dry and intact.  He is here for his weekly graft application.  He has a new complaint of left third digit it seems to be getting slightly worse.  There is distal tip necrosis to the third digit.  He did not have vascular intervention to the left side.  He would like to reach back out to Dr. [name]  Review of Systems: Negative except as noted in the HPI. Denies N/V/F/Ch.  Past Medical History:  Diagnosis Date   Diabetes mellitus    Hypercholesteremia    Hypertension    Patella fracture    left   PVD (peripheral vascular disease) ([place])     Current Outpatient Medications:    acetaminophen (TYLENOL) 500 MG tablet, Take 1,000 mg by mouth daily., Disp: , Rfl:    aspirin EC 81 MG tablet, Take 81 mg by mouth daily., Disp: , Rfl:    B-D ULTRAFINE III SHORT PEN 31G X 8 MM MISC, Inject into the skin 5 (five) times daily., Disp: , Rfl:    calcium carbonate (TUMS - DOSED IN MG ELEMENTAL CALCIUM) 500 MG chewable tablet, Chew 1,000 mg by mouth daily as needed for indigestion or heartburn., Disp: , Rfl:    clopidogrel (PLAVIX) 75 MG tablet, Take 75 mg by mouth daily., Disp: , Rfl:    collagenase (SANTYL) ointment, Apply 1 application topically daily. (Patient not taking: Reported on 02/25/2021), Disp: 30 g, Rfl: 5   dapagliflozin propanediol (FARXIGA) 5 MG TABS tablet, Take 5 mg by mouth daily., Disp: , Rfl:    diltiazem (CARDIZEM CD) 120 MG 24 hr capsule, Take 120 mg by mouth daily., Disp: , Rfl:    doxycycline (VIBRA-TABS) 100 MG tablet, Take 1 tablet (100 mg total) by mouth 2 (two) times daily. (Patient not taking: Reported on 02/25/2021), Disp: 14 tablet, Rfl: 0   glucose blood  (FREESTYLE LITE) test strip, TEST 3 TIMES A DAY E11.9, Disp: , Rfl:    Insulin Glargine-Lixisenatide (SOLIQUA) 100-33 UNT-MCG/ML SOPN, Inject 50 Units into the skin daily., Disp: , Rfl:    Lancets (FREESTYLE) lancets, AS DIRECTED THREE TIMES A DAY, Disp: , Rfl:   Social History   Tobacco Use  Smoking Status Never  Smokeless Tobacco Never    Allergies  Allergen Reactions   Metformin Hcl Other (See Comments)    Unknown reaction   Pravastatin Sodium Other (See Comments)    Muscle pain    Objective:  There were no vitals filed for this visit. There is no height or weight on file to calculate BMI. Constitutional Well developed. Well nourished.  Vascular Dorsalis pedis pulses non palpable bilaterally. Posterior tibial pulses non palpable bilaterally. Capillary refill normal to all digits.  No cyanosis or clubbing noted. Pedal hair growth normal.  Neurologic Normal speech. Oriented to person, place, and time. Protective sensation absent  Dermatologic Wound Location: Right hallux hallux ulceration with fat layer exposed.  No malodor no probing down to bone. Wound Base: Mixed Granular/Fibrotic Peri-wound: Reddened Exudate: Scant/small amount Serosanguinous exudate Wound Measurements: -See below  Left third digit distal tip necrosis superficial dry.  No clinical signs of infection noted.  Probes to deep tissue not down to bone.  No malodor present.  Orthopedic: No pain to palpation either foot.   Radiographs: None Assessment:   1. Diabetic ulcer of toe of right foot associated with type 2 diabetes mellitus, with fat layer exposed ([place])   2. Vascular abnormality   3. Gangrene of toe of left foot ([place])          Plan:  Patient was evaluated and treated and all questions answered.  Ulcer right hallux ulceration with fat layer exposed with history of peripheral artery disease -Debridement as below. -Dressed with Betadine wet-to-dry, DSD. -Continue off-loading with  surgical shoe. -Patient had undergone vascular intervention by Dr. [name] and has best blood flow to the right lower extremity to heal the wounds. -We will continue to do weekly graft application to resolve meant  Left third digit distal tip gangrene superficial/dry -All questions and concerns were discussed with the patient in extensive detail. -Patient will benefit from intervention to the left side as patient is having soft tissue loss to the left third digit. -I will reach out to Dr. [name] for vascular intervention to the left lower extremity. -Continue Betadine wet-to-dry dressing -Continue offloading with surgical shoe  -Application of synthetic skin graft/substitute  Name: [employer] Graft  Usage: 2 x 2 cm Graft was applied directly to the listed above wound site and secured with Adaptic, kerlix, Ace bandage. Waste: No graft material was wasted and was applied in its entirety.   Procedure: Excisional Debridement of Wound Tool: Sharp chisel blade/tissue nipper Rationale: Removal of non-viable soft tissue from the wound to promote healing.  Anesthesia: none Pre-Debridement Wound Measurements: 1.3 cm x 1.1cm x 0.3 cm  Post-Debridement Wound Measurements: 1.5 cm x 1.2 cm x 0.3 cm  Type of Debridement: Sharp Excisional Tissue Removed: Non-viable soft tissue Blood loss: Minimal (<50cc) Depth of Debridement: subcutaneous tissue. Technique: Sharp excisional debridement to bleeding, viable wound base.  Wound Progress: The wound size is is decreasing. Dressing: Dry, sterile, compression dressing. Disposition: Patient tolerated procedure well. Patient to return in 1 week for follow-up.  No follow-ups on file.

## 2021-04-07 DIAGNOSIS — E785 Hyperlipidemia, unspecified: Secondary | ICD-10-CM | POA: Diagnosis not present

## 2021-04-09 ENCOUNTER — Ambulatory Visit (INDEPENDENT_AMBULATORY_CARE_PROVIDER_SITE_OTHER): Payer: Medicare Other | Admitting: Podiatry

## 2021-04-09 ENCOUNTER — Encounter: Payer: Self-pay | Admitting: *Deleted

## 2021-04-09 ENCOUNTER — Other Ambulatory Visit: Payer: Self-pay | Admitting: Family Medicine

## 2021-04-09 ENCOUNTER — Other Ambulatory Visit (HOSPITAL_COMMUNITY): Payer: Self-pay | Admitting: Interventional Radiology

## 2021-04-09 ENCOUNTER — Ambulatory Visit
Admission: RE | Admit: 2021-04-09 | Discharge: 2021-04-09 | Disposition: A | Discharge status: Home / Self Care | Payer: Medicare Other | Source: Ambulatory Visit | Attending: Interventional Radiology | Admitting: Interventional Radiology

## 2021-04-09 ENCOUNTER — Other Ambulatory Visit: Payer: Self-pay

## 2021-04-09 DIAGNOSIS — I96 Gangrene, not elsewhere classified: Secondary | ICD-10-CM | POA: Diagnosis not present

## 2021-04-09 DIAGNOSIS — I999 Unspecified disorder of circulatory system: Secondary | ICD-10-CM | POA: Diagnosis not present

## 2021-04-09 DIAGNOSIS — L97512 Non-pressure chronic ulcer of other part of right foot with fat layer exposed: Secondary | ICD-10-CM

## 2021-04-09 DIAGNOSIS — S91331S Puncture wound without foreign body, right foot, sequela: Secondary | ICD-10-CM

## 2021-04-09 DIAGNOSIS — E11621 Type 2 diabetes mellitus with foot ulcer: Secondary | ICD-10-CM | POA: Diagnosis not present

## 2021-04-09 HISTORY — PX: IR RADIOLOGIST EVAL & MGMT: IMG5224

## 2021-04-09 NOTE — Progress Notes (Signed)
° ° °Chief Complaint: °Bilateral diabetic food wounds.  ° °Referring Physician(s): °Dr. Kevin Patel °  °PCP: Dr. James Kim °  °History of Present Illness: °  °Zachary [name] is a very pleasant 75-year-old male, presenting today as a scheduled follow up to vascular and Interventional Radiology, after treatment of diabetic foot wound.  °  °He joins us today with his son in the clinic.  ° °Hx: °We met [name] in June of 2021 with a right second toe surgical site wound, and decided that conservative management was best given his wound healing trajectory. ° °Non-invasive exam 12/11/20: °Right ABI: 0.7, monophasic waveforms °Left ABI: 0.53, monophasic waveforms ° °Interval:  °We treated his right lower extremity femoral popliteal disease 03/03/2021, with DAART, restoring excellent flow into the tibial arteries.  He was noted to have residual tibial disease which we did not treat at the time.  ° °Since our treatment, he tells me that he had some noticeable improvement of the right great toe wound, but recently within the last week or so there has been regression of healing, with gangrenous changes.   ° °Also, he has a new wound on the tip of the left 4th toe.  This was debrided last week by Dr. Patel, and has a static appearance over the last week.  IT continues to have good bleeding on exam.  ° °He denies any interval change in health, and denies any resting chest pain or CP with activity. .  ° °Past Medical History:  °Diagnosis Date  ° Diabetes mellitus   ° Hypercholesteremia   ° Hypertension   ° Patella fracture   ° left  ° PVD (peripheral vascular disease) ([facility])   ° ° °Past Surgical History:  °Procedure Laterality Date  ° AMPUTATION    ° AMPUTATION  11/28/2011  ° Procedure: AMPUTATION RAY;  Surgeon: Gregory Scott Dean, MD;  Location: [facility];  Service: Orthopedics;  Laterality: Left;  left 2nd toe amputation  ° AMPUTATION TOE Right 09/14/2019  ° Procedure: AMPUTATION TOE RIGHT SECOND TOE;  Surgeon: Price, Michael J, [name];   Location: [facility];  Service: Podiatry;  Laterality: Right;  ° IR ANGIOGRAM EXTREMITY BILATERAL  03/03/2021  ° IR ANGIOGRAM EXTREMITY RIGHT  03/03/2021  ° IR FEM POP ART PTA MOD SED  03/03/2021  ° IR INTRAVASCULAR ULTRASOUND NON CORONARY  03/03/2021  ° IR RADIOLOGIST EVAL & MGMT  09/27/2019  ° IR RADIOLOGIST EVAL & MGMT  12/18/2019  ° IR RADIOLOGIST EVAL & MGMT  02/11/2021  ° IR RADIOLOGIST EVAL & MGMT  04/09/2021  ° IR TIB-PERO ART ATHEREC INC PTA MOD SED  03/03/2021  ° IR US GUIDE VASC ACCESS LEFT  03/03/2021  ° ORIF PATELLA Left 01/10/2019  ° Procedure: OPEN REDUCTION INTERNAL (ORIF) FIXATION LEFT PATELLA FRACTURE;  Surgeon: Xu, Naiping M, MD;  Location: [facility];  Service: Orthopedics;  Laterality: Left;  ° ° °Allergies: °Metformin hcl and Pravastatin sodium ° °Medications: °Prior to Admission medications   °Medication Sig Start Date End Date Taking? Authorizing Provider  °acetaminophen (TYLENOL) 500 MG tablet Take 1,000 mg by mouth daily.    [provider]  °aspirin EC 81 MG tablet Take 81 mg by mouth daily.    [provider]  °B-D ULTRAFINE III SHORT PEN 31G X 8 MM MISC Inject into the skin 5 (five) times daily. 11/26/19   [provider]  °calcium carbonate (TUMS - DOSED IN MG ELEMENTAL CALCIUM) 500 MG chewable tablet Chew 1,000 mg   tablet Chew 1,000 mg by mouth daily as needed for indigestion or heartburn.    [provider]  clopidogrel (PLAVIX) 75 MG tablet Take 75 mg by mouth daily.    [provider]  collagenase (SANTYL) ointment Apply 1 application topically daily. Patient not taking: Reported on 02/25/2021 01/15/21   [name], [name]  dapagliflozin propanediol (FARXIGA) 5 MG TABS tablet Take 5 mg by mouth daily.    [provider]  diltiazem (CARDIZEM CD) 120 MG 24 hr capsule Take 120 mg by mouth daily. 03/16/20   [provider]  doxycycline (VIBRA-TABS) 100 MG tablet Take 1 tablet (100 mg total) by mouth 2 (two) times daily. Patient not  taking: Reported on 02/25/2021 01/15/21   [name], [name]  glucose blood (FREESTYLE LITE) test strip TEST 3 TIMES A DAY E11.9    [provider]  Insulin Glargine-Lixisenatide (SOLIQUA) 100-33 UNT-MCG/ML SOPN Inject 50 Units into the skin daily.    [provider]  Lancets (FREESTYLE) lancets AS DIRECTED THREE TIMES A DAY    [provider]     No family history on file.  Social History   Socioeconomic History   Marital status: Married    Spouse name: Not on file   Number of children: Not on file   Years of education: Not on file   Highest education level: Not on file  Occupational History   Not on file  Tobacco Use   Smoking status: Never   Smokeless tobacco: Never  Substance and Sexual Activity   Alcohol use: No   Drug use: No   Sexual activity: Not on file  Other Topics Concern   Not on file  Social History Narrative   Not on file   Social Determinants of Health   Financial Resource Strain: Not on file  Food Insecurity: Not on file  Transportation Needs: Not on file  Physical Activity: Not on file  Stress: Not on file  Social Connections: Not on file     Review of Systems: A 12 point ROS discussed and pertinent positives are indicated in the HPI above.  All other systems are negative.  Review of Systems  Vital Signs: There were no vitals taken for this visit.  Physical Exam General: 75 yo male appearing stated age.  Well-developed, well-nourished.  No distress. HEENT: Atraumatic, normocephalic.  No scleral icterus or scleral injection. No lesions on external ears, nose, lips, or gums.  Oral mucosa moist, pink.  Neck: Symmetric with no goiter enlargement.  Chest/Lungs:  Symmetric chest with inspiration/expiration.  No labored breathing.    Heart:   No JVD appreciated.  Abdomen:  Soft, NT/ND   Genito-urinary: Deferred Neurologic: Alert & Oriented to person, place, and time.   Normal affect and insight.  Appropriate questions.   Moving all 4 extremities with gross sensory intact.  Pulse Exam:  positive doppler of the right and left DP and PT.  On the right, there is multi-phasic doppler.  On the left, monophasic.  Extremities: Right great toe wound with gangrenous change, full thickness on the medial aspect.  Rubor at the base of the toe. Dry flaking skin Left 1st/2nd amputation site healed.  There is partial thickness wound on the tip of the left 4th toe, with active bleeding on exam.  Rubor of the toes. He also has a small wound on the medial mid-foot, with hypertrophic border and no erythema. .     Mallampati Score:     Imaging:  Mgmt ° °Result Date: 04/09/2021 °Please refer to notes tab for details about interventional procedure. (Op Note)  ° °Labs: ° °CBC: °Recent Labs  °  03/03/21 °0711  °WBC 9.9  °HGB 15.9  °HCT 45.9  °PLT 214  ° ° °COAGS: °Recent Labs  °  03/03/21 °0711  °INR 1.0  ° ° °BMP: °Recent Labs  °  03/03/21 °0711  °NA 136  °K 4.1  °CL 103  °CO2 22  °GLUCOSE 194*  °BUN 25*  °CALCIUM 8.7*  °CREATININE 1.87*  °GFRNONAA 37*  ° ° °LIVER FUNCTION TESTS: °No results for input(s): BILITOT, AST, ALT, ALKPHOS, PROT, ALBUMIN in the last 8760 hours. ° °TUMOR MARKERS: °No results for input(s): AFPTM, CEA, CA199, CHROMGRNA in the last 8760 hours. ° °Assessment and Plan: ° °Assessment:  °Mr Loncar returns today with now interval regression of the right great toe wound, as well as new wound of the left 4th toe.  ° °I think the new gangrenous change of the right great to indicates that either there is recurrent disease at the fem-pop site, or that he needs more aggressive approach to the residual disease.  I suspect the fem-pop segment is OK, given the multi-phasic doppler signal at the foot.  Thus, we will plan for a more aggressive revasc of the tibial segment.  ° °Regarding the left, he certainly needs an angiogram and possible intervention, as he has monophasic signal and the wound.  I think, because  there is bleeding at the wound, that we can perform this secondarily.   ° °I updated Mr Smith and his son regarding the advanced PAD, and my tentative treatment plan.  Informed consent regarding endovascular options was obtained.   Regarding endovascular options, specific risks discussed include: bleeding, infection, contrast reaction, renal injury/nephropathy, arterial injury/dissection, need for additional procedure/surgery, worsening symptoms/tissue including limb loss, cardiopulmonary collapse, death.   ° °After our discussion, he would like to proceed ASAP for attempt at tissue salvage.  ° °Continue current medication. .  ° °Plan: °- Plan for urgent lower extremity angiogram and possible intervention/revascularization of bilateral CLI/CLTI, first targeting the RIGHT (given the gangrenous change), and then targeting the LEFT fourth toe, for bilateral diabetic wounds, advanced PAD, and threatened limbs. .   °- We will attempt for next Tuesday or Wednesday for the RIGHT extremity, and I have asked him to not eat anything on Monday night.  °- We will then plan for an angiogram/intervention of the LEFT lower extremity the following week.  °- Continue current medical therapy. ° ° °___________________________________________________________________ ° ° °1- Gerhard-Herman MD, et al. 2016 AHA/ACC Guideline on the Management of Patients With Lower Extremity Peripheral Artery Disease: Executive Summary: A Report of the American College of Cardiology/American Heart Association Task Force on Clinical Practice Guidelines. J Am Coll Cardiol. 2017 Mar 21;69(11):1465-1508. doi: 10.1016/j.jacc.2016.11.008.  ° °2 - Norgren L, et al. TASC II Working Group. Inter-society consensus for the management of peripheral arterial disease. Int Angiol. 2007 Jun;26(2):81-157. Review. PubMed PMID: 17489079 ° °3 - Hingorani A, et al. The management of diabetic foot: A clinical practice guideline by the Society for Vascular Surgery in  collaboration with the American Podiatric Medical Association and the Society  for Vascular Medicine. J Vasc Surg. 2016 Feb;63(2 Suppl):3S-21S. doi: 10.1016/j.jvs.2015.10.003. PubMed PMID: 26804367. ° °4 - Mustapha JA, Saab FA, Martinsen BJ, Pena CS, Zeller T, Driver VR, Neville RF, Lookstein R, van den Berg JC, Jaff MR, Michael P, Henao S, AlMahameed A, Katzen B. Digital Subtraction Angiography Prior   Digital Subtraction Angiography Prior to an Amputation for Critical Limb Ischemia (CLI): An Expert Recommendation Statement From the CLI Global Society to Optimize Limb Salvage. J Endovasc Ther. 2020 Aug;27(4):540-546. doi: 10.1177/1526602820928590. Epub 2020 May 29. PMID: 28315176.      Electronically Signed: Corrie Mckusick 04/09/2021, 1:45 PM   I spent a total of    25 Minutes in face to face in clinical consultation, greater than 50% of which was counseling/coordinating care for bilateral lower extremity diabetic foot wound, threatened limb, CLTI/CLI.

## 2021-04-13 ENCOUNTER — Other Ambulatory Visit: Payer: Self-pay | Admitting: Radiology

## 2021-04-14 ENCOUNTER — Other Ambulatory Visit (HOSPITAL_COMMUNITY): Payer: Self-pay | Admitting: Interventional Radiology

## 2021-04-14 ENCOUNTER — Ambulatory Visit (HOSPITAL_COMMUNITY)
Admission: RE | Admit: 2021-04-14 | Discharge: 2021-04-14 | Disposition: A | Discharge status: Home / Self Care | Payer: Medicare PPO | Source: Ambulatory Visit | Attending: Interventional Radiology | Admitting: Interventional Radiology

## 2021-04-14 ENCOUNTER — Other Ambulatory Visit: Payer: Self-pay

## 2021-04-14 DIAGNOSIS — E11621 Type 2 diabetes mellitus with foot ulcer: Secondary | ICD-10-CM | POA: Diagnosis not present

## 2021-04-14 DIAGNOSIS — E785 Hyperlipidemia, unspecified: Secondary | ICD-10-CM | POA: Insufficient documentation

## 2021-04-14 DIAGNOSIS — L97519 Non-pressure chronic ulcer of other part of right foot with unspecified severity: Secondary | ICD-10-CM | POA: Diagnosis not present

## 2021-04-14 DIAGNOSIS — I96 Gangrene, not elsewhere classified: Secondary | ICD-10-CM

## 2021-04-14 DIAGNOSIS — I70261 Atherosclerosis of native arteries of extremities with gangrene, right leg: Secondary | ICD-10-CM | POA: Diagnosis not present

## 2021-04-14 DIAGNOSIS — E1152 Type 2 diabetes mellitus with diabetic peripheral angiopathy with gangrene: Secondary | ICD-10-CM | POA: Insufficient documentation

## 2021-04-14 DIAGNOSIS — I10 Essential (primary) hypertension: Secondary | ICD-10-CM | POA: Insufficient documentation

## 2021-04-14 HISTORY — PX: IR ANGIOGRAM EXTREMITY RIGHT: IMG652

## 2021-04-14 HISTORY — PX: IR US GUIDE VASC ACCESS RIGHT: IMG2390

## 2021-04-14 HISTORY — PX: IR TIB-PERO ART PTA MOD SED: IMG2313

## 2021-04-14 LAB — GLUCOSE, CAPILLARY: Glucose-Capillary: 251 mg/dL — ABNORMAL HIGH (ref 70–99)

## 2021-04-14 LAB — BASIC METABOLIC PANEL
Anion gap: 12 (ref 5–15)
BUN: 21 mg/dL (ref 8–23)
CO2: 22 mmol/L (ref 22–32)
Calcium: 8.8 mg/dL — ABNORMAL LOW (ref 8.9–10.3)
Chloride: 95 mmol/L — ABNORMAL LOW (ref 98–111)
Creatinine, Ser: 1.93 mg/dL — ABNORMAL HIGH (ref 0.61–1.24)
GFR, Estimated: 35 mL/min — ABNORMAL LOW (ref >60)
Glucose, Bld: 240 mg/dL — ABNORMAL HIGH (ref 70–99)
Potassium: 4.6 mmol/L (ref 3.5–5.1)
Sodium: 129 mmol/L — ABNORMAL LOW (ref 135–145)

## 2021-04-14 LAB — CBC
HCT: 45.9 % (ref 39.0–52.0)
Hemoglobin: 15.7 g/dL (ref 13.0–17.0)
MCH: 29.2 pg (ref 26.0–34.0)
MCHC: 34.2 g/dL (ref 30.0–36.0)
MCV: 85.3 fL (ref 80.0–100.0)
Platelets: 217 10*3/uL (ref 150–400)
RBC: 5.38 MIL/uL (ref 4.22–5.81)
RDW: 12.2 % (ref 11.5–15.5)
WBC: 14.3 10*3/uL — ABNORMAL HIGH (ref 4.0–10.5)
nRBC: 0 % (ref 0.0–0.2)

## 2021-04-14 LAB — PROTIME-INR
INR: 1 (ref 0.8–1.2)
Prothrombin Time: 13.6 seconds (ref 11.4–15.2)

## 2021-04-14 MED ORDER — MIDAZOLAM HCL 2 MG/2ML IJ SOLN
INTRAMUSCULAR | Status: AC
  Filled 2021-04-14: qty 4

## 2021-04-14 MED ORDER — SODIUM CHLORIDE 0.9 % IV SOLN: INTRAVENOUS | Status: AC

## 2021-04-14 MED ORDER — FENTANYL CITRATE (PF) 100 MCG/2ML IJ SOLN
INTRAMUSCULAR | Status: DC | PRN
  Administered 2021-04-14 (×4): 25 ug via INTRAVENOUS

## 2021-04-14 MED ORDER — HEPARIN SODIUM (PORCINE) 1000 UNIT/ML IJ SOLN
INTRAMUSCULAR | Status: DC | PRN
  Administered 2021-04-14: 6000 [IU] via INTRAVENOUS
  Administered 2021-04-14: 3000 [IU] via INTRAVENOUS

## 2021-04-14 MED ORDER — ASPIRIN 325 MG PO TABS: 650.0000 mg | ORAL_TABLET | Freq: Once | ORAL | Status: AC

## 2021-04-14 MED ORDER — FENTANYL CITRATE (PF) 100 MCG/2ML IJ SOLN
INTRAMUSCULAR | Status: AC
  Filled 2021-04-14: qty 2

## 2021-04-14 MED ORDER — MIDAZOLAM HCL 2 MG/2ML IJ SOLN
INTRAMUSCULAR | Status: DC | PRN
  Administered 2021-04-14: 1 mg via INTRAVENOUS
  Administered 2021-04-14 (×3): .5 mg via INTRAVENOUS

## 2021-04-14 MED ORDER — LIDOCAINE HCL 1 % IJ SOLN
INTRAMUSCULAR | Status: AC
  Administered 2021-04-14: 7 mL via SUBCUTANEOUS
  Filled 2021-04-14: qty 20

## 2021-04-14 MED ORDER — HEPARIN SODIUM (PORCINE) 1000 UNIT/ML IJ SOLN
INTRAMUSCULAR | Status: AC
  Filled 2021-04-14: qty 10

## 2021-04-14 MED ORDER — SODIUM CHLORIDE 0.9 % IV SOLN: INTRAVENOUS | Status: DC

## 2021-04-14 MED ORDER — IOHEXOL 300 MG/ML  SOLN
100.0000 mL | Freq: Once | INTRAMUSCULAR | Status: AC | PRN
  Administered 2021-04-14: 18 mL via INTRA_ARTERIAL

## 2021-04-14 MED ORDER — ASPIRIN 325 MG PO TABS
ORAL_TABLET | ORAL | Status: AC
  Administered 2021-04-14: 650 mg via ORAL
  Filled 2021-04-14: qty 2

## 2021-04-14 MED ORDER — ASPIRIN 325 MG PO TABS
ORAL_TABLET | ORAL | Status: AC
  Filled 2021-04-14: qty 1

## 2021-04-14 NOTE — Procedures (Signed)
Interventional Radiology Procedure Note  Procedure:   US guided access right CFA, antegrade. US guided access right PT artery. Standard PTA of occluded right PT Angioseal for hemostasis  Findings: Patent R CFA Patent fem-pop system In-line flow to the ankle via the peroneal artery. AT is occluded in the first third PT was occluded proximally on initial, In-line flow into the pedal arteries/forefoot on conclusion.   EBL: ~78ER Complications: None  Recommendations:  - 4 hrs right hip straight - NS 150cc per hour - advance diet - anticipate DC home in 4 hours - Continue dual anti-platelets - Do not submerge for 7 days - Routine wound care - plan for revasc of left lower extremity once patient recovers - continue wound care   Signed,  Dulcy Fanny. [name], DO

## 2021-04-14 NOTE — H&P (Signed)
Chief Complaint: Patient was seen in consultation today for bilateral diabetic foot wounds.  Referring Physician(s): Boneta Lucks, MD  Supervising Physician: Corrie Mckusick  Patient Status: Zachary [name] - Out-pt  History of Present Illness: Zachary [name] is a 76 y.o. male with a past medical history significant for HTN, HLD, DM and PVD s/p treatment of right SFA/pop mx focal disease with DAART therapy 03/03/21 with Dr. [name] who presents today for right lower extremity angiogram and possible intervention. [name] has been followed by IR since 2021, undergoing treatment of his right lower extremity femoral popliteal disease 03/03/21 with DAART. He was seen in clinic on 04/09/21 for follow up with Dr. [name], please see this consult note for full details. He reported initial improvement in right great toe wound healing however there had been some regression including new gangrenous changes as well as a new wound on the tip of left 4th toe which had been debrided recently by podiatry. Given the new gangrenous changes decision was made to proceed with revascularization of the right tibial segment for which he presents today.  [name] denies any complaints except for back pain due to the uncomfortable bed, he has not noticed any changes in his toe wounds since he was last seen in clinic. He denies any chest pain or dyspnea. He understands the procedure today and is agreeable to proceed as planned.  Past Medical History:  Diagnosis Date   Diabetes mellitus    Hypercholesteremia    Hypertension    Patella fracture    left   PVD (peripheral vascular disease) ([place])     Past Surgical History:  Procedure Laterality Date   AMPUTATION     AMPUTATION  11/28/2011   Procedure: AMPUTATION RAY;  Surgeon: [name], MD;  Location: [facility];  Service: Orthopedics;  Laterality: Left;  left 2nd toe amputation   AMPUTATION TOE Right 09/14/2019   Procedure: AMPUTATION TOE RIGHT SECOND TOE;  Surgeon:  [name], [name];  Location: [facility];  Service: Podiatry;  Laterality: Right;   IR ANGIOGRAM EXTREMITY BILATERAL  03/03/2021   IR ANGIOGRAM EXTREMITY RIGHT  03/03/2021   IR FEM POP ART PTA MOD SED  03/03/2021   IR INTRAVASCULAR ULTRASOUND NON CORONARY  03/03/2021   IR RADIOLOGIST EVAL & MGMT  09/27/2019   IR RADIOLOGIST EVAL & MGMT  12/18/2019   IR RADIOLOGIST EVAL & MGMT  02/11/2021   IR RADIOLOGIST EVAL & MGMT  04/09/2021   IR TIB-PERO ART ATHEREC INC PTA MOD SED  03/03/2021   IR US GUIDE VASC ACCESS LEFT  03/03/2021   ORIF PATELLA Left 01/10/2019   Procedure: OPEN REDUCTION INTERNAL (ORIF) FIXATION LEFT PATELLA FRACTURE;  Surgeon: [name], MD;  Location: [facility];  Service: Orthopedics;  Laterality: Left;    Allergies: Metformin hcl and Pravastatin sodium  Medications: Prior to Admission medications   Medication Sig Start Date End Date Taking? Authorizing Provider  acetaminophen (TYLENOL) 500 MG tablet Take 1,000 mg by mouth every 8 (eight) hours as needed for moderate pain.   Yes [provider]  aspirin EC 81 MG tablet Take 81 mg by mouth daily.   Yes [provider]  calcium carbonate (TUMS - DOSED IN MG ELEMENTAL CALCIUM) 500 MG chewable tablet Chew 1,000 mg by mouth daily as needed for indigestion or heartburn.   Yes [provider]  clopidogrel (PLAVIX) 75 MG tablet Take 75 mg by mouth daily.   Yes [provider]  diltiazem (CARDIZEM CD) 120 MG 24 hr capsule Take 120 mg by mouth daily. 03/16/20  Yes [provider]  Insulin Glargine-Lixisenatide (SOLIQUA) 100-33 UNT-MCG/ML SOPN Inject 50 Units into the skin daily.   Yes [provider]  B-D ULTRAFINE III SHORT PEN 31G X 8 MM MISC Inject into the skin 5 (five) times daily. 11/26/19   [provider]  collagenase (SANTYL) ointment Apply 1 application topically daily. Patient not taking: Reported on 02/25/2021 01/15/21   [name], [name]   doxycycline (VIBRA-TABS) 100 MG tablet Take 1 tablet (100 mg total) by mouth 2 (two) times daily. Patient not taking: Reported on 02/25/2021 01/15/21   [name], [name]  glucose blood (FREESTYLE LITE) test strip TEST 3 TIMES A DAY E11.9    [provider]  Lancets (FREESTYLE) lancets AS DIRECTED THREE TIMES A DAY    [provider]     No family history on file.  Social History   Socioeconomic History   Marital status: Married    Spouse name: Not on file   Number of children: Not on file   Years of education: Not on file   Highest education level: Not on file  Occupational History   Not on file  Tobacco Use   Smoking status: Never   Smokeless tobacco: Never  Substance and Sexual Activity   Alcohol use: No   Drug use: No   Sexual activity: Not on file  Other Topics Concern   Not on file  Social History Narrative   Not on file   Social Determinants of Health   Financial Resource Strain: Not on file  Food Insecurity: Not on file  Transportation Needs: Not on file  Physical Activity: Not on file  Stress: Not on file  Social Connections: Not on file     Review of Systems: A 12 point ROS discussed and pertinent positives are indicated in the HPI above.  All other systems are negative.  Review of Systems  Constitutional:  Negative for chills and fever.  Respiratory:  Negative for cough and shortness of breath.   Cardiovascular:  Negative for chest pain.  Gastrointestinal:  Negative for abdominal pain, nausea and vomiting.  Musculoskeletal:  Positive for back pain (from [name] bed).  Neurological:  Negative for dizziness and headaches.   Vital Signs: BP (!) 149/93    Pulse 95    Temp 98.2 F (36.8 C) (Oral)    Resp 18    Ht 5\' 8"  (1.727 m)    Wt 200 lb (90.7 kg)    SpO2 97%    BMI 30.41 kg/m   Physical Exam Vitals reviewed.  Constitutional:      General: He is not in acute distress. HENT:     Head: Normocephalic.     Mouth/Throat:      Mouth: Mucous membranes are moist.     Pharynx: Oropharynx is clear. No oropharyngeal exudate or posterior oropharyngeal erythema.  Cardiovascular:     Rate and Rhythm: Normal rate and regular rhythm.  Pulmonary:     Effort: Pulmonary effort is normal.     Breath sounds: Normal breath sounds.  Abdominal:     General: There is no distension.     Palpations: Abdomen is soft.     Tenderness: There is no abdominal tenderness.  Musculoskeletal:     Comments: (+) right great toe with gangrenous changes, non bleeding.   Skin:    General: Skin is warm and dry.  Neurological:  Mental Status: He is alert.     MD Evaluation Airway: WNL Heart: WNL Abdomen: WNL Chest/ Lungs: WNL ASA  Classification: 2 Mallampati/Airway Score: One   Imaging: IR Radiologist Eval & Mgmt  Result Date: 04/09/2021 Please refer to notes tab for details about interventional procedure. (Op Note)   Labs:  CBC: Recent Labs    03/03/21 0711 04/14/21 0724  WBC 9.9 14.3*  HGB 15.9 15.7  HCT 45.9 45.9  PLT 214 217    COAGS: Recent Labs    03/03/21 0711 04/14/21 0724  INR 1.0 1.0    BMP: Recent Labs    03/03/21 0711 04/14/21 0724  NA 136 129*  K 4.1 4.6  CL 103 95*  CO2 22 22  GLUCOSE 194* 240*  BUN 25* 21  CALCIUM 8.7* 8.8*  CREATININE 1.87* 1.93*  GFRNONAA 37* 35*    LIVER FUNCTION TESTS: No results for input(s): BILITOT, AST, ALT, ALKPHOS, PROT, ALBUMIN in the last 8760 hours.  TUMOR MARKERS: No results for input(s): AFPTM, CEA, CA199, CHROMGRNA in the last 8760 hours.  Assessment and Plan:  76 y/o M with history of PAD s/p treatment of right SFA/pop mx focal disease with DAART therapy 03/03/21 with Dr. [name] found to have new gangrenous changes of the right great toe as well as a new left 4th toe wound at follow 04/09/21 who presents today for RLE angiogram with possible intervention. He is planned to undergo LLE angiogram with possible intervention next week.  Risks and  benefits of right lower extremity arteriogram with intervention were discussed with the patient including, but not limited to bleeding, infection, vascular injury, contrast induced renal failure, stroke, reperfusion hemorrhage, or even death.  This interventional procedure involves the use of X-rays and because of the nature of the planned procedure, it is possible that we will have prolonged use of X-ray fluoroscopy. Potential radiation risks to you include (but are not limited to) the following: - A slightly elevated risk for cancer  several years later in life. This risk is typically less than 0.5% percent. This risk is low in comparison to the normal incidence of human cancer, which is 33% for women and 50% for men according to the [place]. - Radiation induced injury can include skin redness, resembling a rash, tissue breakdown / ulcers and hair loss (which can be temporary or permanent).  The likelihood of either of these occurring depends on the difficulty of the procedure and whether you are sensitive to radiation due to previous procedures, disease, or genetic conditions.  IF your procedure requires a prolonged use of radiation, you will be notified and given written instructions for further action.  It is your responsibility to monitor the irradiated area for the 2 weeks following the procedure and to notify your physician if you are concerned that you have suffered a radiation induced injury.    All of the patient's questions were answered, patient is agreeable to proceed.  Consent signed and in chart.  Thank you for this interesting consult.  I greatly enjoyed meeting TARRY FOUNTAIN and look forward to participating in their care.  A copy of this report was sent to the requesting provider on this date.  Electronically Signed: Joaquim Nam, PA-C 04/14/2021, 7:59 AM   I spent a total of  25 Minutes in face to face in clinical consultation, greater than 50% of which  was counseling/coordinating care for RLE angiogram with possible intervention.

## 2021-04-14 NOTE — Progress Notes (Signed)
Subjective:  Patient ID: Zachary [name], male    DOB: 01/22/1946,  MRN: [id number]  Chief Complaint  Patient presents with   Wound Check    Right hallux     76 y.o. male presents for wound care.  Patient presents for follow-up of right hallux wound.  Patient states the wound has gotten worse.  He has been doing a lot of walking and may have regressed during the holiday.  He would like to have there is anything else that could be done.  The wound does probe down to deeper tissue with exposure of bone.  The grafting has failed.  Review of Systems: Negative except as noted in the HPI. Denies N/V/F/Ch.  Past Medical History:  Diagnosis Date   Diabetes mellitus    Hypercholesteremia    Hypertension    Patella fracture    left   PVD (peripheral vascular disease) (HCC)     Current Outpatient Medications:    acetaminophen (TYLENOL) 500 MG tablet, Take 1,000 mg by mouth every 8 (eight) hours as needed for moderate pain., Disp: , Rfl:    aspirin EC 81 MG tablet, Take 81 mg by mouth daily., Disp: , Rfl:    B-D ULTRAFINE III SHORT PEN 31G X 8 MM MISC, Inject into the skin 5 (five) times daily., Disp: , Rfl:    calcium carbonate (TUMS - DOSED IN MG ELEMENTAL CALCIUM) 500 MG chewable tablet, Chew 1,000 mg by mouth daily as needed for indigestion or heartburn., Disp: , Rfl:    clopidogrel (PLAVIX) 75 MG tablet, Take 75 mg by mouth daily., Disp: , Rfl:    collagenase (SANTYL) ointment, Apply 1 application topically daily. (Patient not taking: Reported on 02/25/2021), Disp: 30 g, Rfl: 5   diltiazem (CARDIZEM CD) 120 MG 24 hr capsule, Take 120 mg by mouth daily., Disp: , Rfl:    doxycycline (VIBRA-TABS) 100 MG tablet, Take 1 tablet (100 mg total) by mouth 2 (two) times daily. (Patient not taking: Reported on 02/25/2021), Disp: 14 tablet, Rfl: 0   glucose blood (FREESTYLE LITE) test strip, TEST 3 TIMES A DAY E11.9, Disp: , Rfl:    Insulin Glargine-Lixisenatide (SOLIQUA) 100-33 UNT-MCG/ML SOPN, Inject  50 Units into the skin daily., Disp: , Rfl:    Lancets (FREESTYLE) lancets, AS DIRECTED THREE TIMES A DAY, Disp: , Rfl:  No current facility-administered medications for this visit.  Facility-Administered Medications Ordered in Other Visits:    0.9 %  sodium chloride infusion, , Intravenous, Continuous, Monia Sabal, PA-C   fentaNYL (SUBLIMAZE) injection, , Intravenous, PRN, Corrie Mckusick, DO, 25 mcg at 04/14/21 1036   heparin sodium (porcine) injection, , Intravenous, PRN, Corrie Mckusick, DO, 3,000 Units at 04/14/21 0953   iohexol (OMNIPAQUE) 300 MG/ML solution 100 mL, 100 mL, Intra-arterial, Once PRN, Corrie Mckusick, DO   midazolam (VERSED) injection, , Intravenous, PRN, Corrie Mckusick, DO, 0.5 mg at 04/14/21 1036  Social History   Tobacco Use  Smoking Status Never  Smokeless Tobacco Never    Allergies  Allergen Reactions   Metformin Hcl Other (See Comments)    Unknown reaction   Pravastatin Sodium Other (See Comments)    Muscle pain    Objective:  There were no vitals filed for this visit. There is no height or weight on file to calculate BMI. Constitutional Well developed. Well nourished.  Vascular Dorsalis pedis pulses non palpable bilaterally. Posterior tibial pulses non palpable bilaterally. Capillary refill normal to all digits.  No cyanosis or clubbing noted. Pedal hair  growth normal.  Neurologic Normal speech. Oriented to person, place, and time. Protective sensation absent  Dermatologic Wound Location: Right hallux hallux ulceration with fat layer exposed.  No malodor no probing down to bone. Wound Base: Mixed Granular/Fibrotic Peri-wound: Reddened Exudate: Scant/small amount Serosanguinous exudate Wound Measurements: -See below  Left third digit distal tip necrosis superficial dry.  No clinical signs of infection noted.  Probes to deep tissue not down to bone.  No malodor present.  Orthopedic: No pain to palpation either foot.   Radiographs:  None Assessment:   1. Diabetic ulcer of toe of right foot associated with type 2 diabetes mellitus, with fat layer exposed ([place])   2. Vascular abnormality   3. Gangrene of toe of left foot ([place])           Plan:  Patient was evaluated and treated and all questions answered.  Ulcer right hallux ulceration with fat layer exposed with history of peripheral artery disease -Debridement as below. -Dressed with Betadine wet-to-dry, DSD. -Continue off-loading with surgical shoe. -He will have a repeat angiogram with intervention to the right lower extremity per Dr. [name] -I will hold off on grafting for now until there is a resolving.  The wound has regressed considerably. -We will resume Betadine wet-to-dry dressing  Left third digit distal tip gangrene superficial/dry -All questions and concerns were discussed with the patient in extensive detail. -Patient will benefit from intervention to the left side as patient is having soft tissue loss to the left third digit. -Patient will be scheduled to undergo vascular intervention to the left lower extremity after the right lower extremity -Continue Betadine wet-to-dry dressing -Continue offloading with surgical shoe   Procedure: Excisional Debridement of Wound Tool: Sharp chisel blade/tissue nipper Rationale: Removal of non-viable soft tissue from the wound to promote healing.  Anesthesia: none Pre-Debridement Wound Measurements: 1.3 cm x 1.1cm x 0.3 cm  Post-Debridement Wound Measurements: 1.5 cm x 1.2 cm x 0.3 cm  Type of Debridement: Sharp Excisional Tissue Removed: Non-viable soft tissue Blood loss: Minimal (<50cc) Depth of Debridement: subcutaneous tissue. Technique: Sharp excisional debridement to bleeding, viable wound base.  Wound Progress: The wound size is is decreasing. Dressing: Dry, sterile, compression dressing. Disposition: Patient tolerated procedure well. Patient to return in 1 week for follow-up.  No follow-ups  on file.

## 2021-04-14 NOTE — Progress Notes (Signed)
Writer observed patients right groin had minimal oozing. Writer held pressure for 20 minutes and redressed area. Radiology PA notified. Will continue to monitor.

## 2021-04-15 ENCOUNTER — Other Ambulatory Visit: Payer: Medicare Other

## 2021-04-16 ENCOUNTER — Ambulatory Visit: Payer: Medicare Other | Admitting: Podiatry

## 2021-04-18 ENCOUNTER — Emergency Department (HOSPITAL_COMMUNITY): Payer: Medicare PPO

## 2021-04-18 ENCOUNTER — Inpatient Hospital Stay (HOSPITAL_COMMUNITY)
Admission: EM | Admit: 2021-04-18 | Discharge: 2021-04-29 | DRG: 853 | Disposition: A | Discharge status: Skilled Nursing Facility | Payer: Medicare PPO | Attending: Internal Medicine | Admitting: Internal Medicine

## 2021-04-18 ENCOUNTER — Observation Stay (HOSPITAL_COMMUNITY): Payer: Medicare PPO

## 2021-04-18 DIAGNOSIS — Z20822 Contact with and (suspected) exposure to covid-19: Secondary | ICD-10-CM | POA: Diagnosis present

## 2021-04-18 DIAGNOSIS — E118 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: Secondary | ICD-10-CM | POA: Diagnosis present

## 2021-04-18 DIAGNOSIS — A4101 Sepsis due to Methicillin susceptible Staphylococcus aureus: Principal | ICD-10-CM | POA: Diagnosis present

## 2021-04-18 DIAGNOSIS — N1832 Chronic kidney disease, stage 3b: Secondary | ICD-10-CM | POA: Diagnosis present

## 2021-04-18 DIAGNOSIS — J189 Pneumonia, unspecified organism: Secondary | ICD-10-CM | POA: Diagnosis present

## 2021-04-18 DIAGNOSIS — E872 Acidosis, unspecified: Secondary | ICD-10-CM | POA: Diagnosis present

## 2021-04-18 DIAGNOSIS — E785 Hyperlipidemia, unspecified: Secondary | ICD-10-CM | POA: Diagnosis present

## 2021-04-18 DIAGNOSIS — Z89421 Acquired absence of other right toe(s): Secondary | ICD-10-CM

## 2021-04-18 DIAGNOSIS — L97519 Non-pressure chronic ulcer of other part of right foot with unspecified severity: Secondary | ICD-10-CM | POA: Diagnosis present

## 2021-04-18 DIAGNOSIS — E222 Syndrome of inappropriate secretion of antidiuretic hormone: Secondary | ICD-10-CM | POA: Diagnosis present

## 2021-04-18 DIAGNOSIS — G061 Intraspinal abscess and granuloma: Secondary | ICD-10-CM | POA: Diagnosis present

## 2021-04-18 DIAGNOSIS — E7800 Pure hypercholesterolemia, unspecified: Secondary | ICD-10-CM | POA: Diagnosis present

## 2021-04-18 DIAGNOSIS — R109 Unspecified abdominal pain: Secondary | ICD-10-CM

## 2021-04-18 DIAGNOSIS — E1169 Type 2 diabetes mellitus with other specified complication: Secondary | ICD-10-CM | POA: Diagnosis present

## 2021-04-18 DIAGNOSIS — Z66 Do not resuscitate: Secondary | ICD-10-CM | POA: Diagnosis present

## 2021-04-18 DIAGNOSIS — B9561 Methicillin susceptible Staphylococcus aureus infection as the cause of diseases classified elsewhere: Secondary | ICD-10-CM

## 2021-04-18 DIAGNOSIS — R7881 Bacteremia: Secondary | ICD-10-CM | POA: Diagnosis present

## 2021-04-18 DIAGNOSIS — T148XXA Other injury of unspecified body region, initial encounter: Secondary | ICD-10-CM

## 2021-04-18 DIAGNOSIS — M86671 Other chronic osteomyelitis, right ankle and foot: Secondary | ICD-10-CM

## 2021-04-18 DIAGNOSIS — Z79899 Other long term (current) drug therapy: Secondary | ICD-10-CM

## 2021-04-18 DIAGNOSIS — Z7902 Long term (current) use of antithrombotics/antiplatelets: Secondary | ICD-10-CM

## 2021-04-18 DIAGNOSIS — E1122 Type 2 diabetes mellitus with diabetic chronic kidney disease: Secondary | ICD-10-CM | POA: Diagnosis present

## 2021-04-18 DIAGNOSIS — E871 Hypo-osmolality and hyponatremia: Secondary | ICD-10-CM | POA: Diagnosis present

## 2021-04-18 DIAGNOSIS — E1142 Type 2 diabetes mellitus with diabetic polyneuropathy: Secondary | ICD-10-CM | POA: Diagnosis present

## 2021-04-18 DIAGNOSIS — E1152 Type 2 diabetes mellitus with diabetic peripheral angiopathy with gangrene: Secondary | ICD-10-CM | POA: Diagnosis present

## 2021-04-18 DIAGNOSIS — I10 Essential (primary) hypertension: Secondary | ICD-10-CM | POA: Diagnosis present

## 2021-04-18 DIAGNOSIS — G253 Myoclonus: Secondary | ICD-10-CM | POA: Diagnosis present

## 2021-04-18 DIAGNOSIS — B9620 Unspecified Escherichia coli [E. coli] as the cause of diseases classified elsewhere: Secondary | ICD-10-CM | POA: Diagnosis present

## 2021-04-18 DIAGNOSIS — M869 Osteomyelitis, unspecified: Secondary | ICD-10-CM | POA: Diagnosis present

## 2021-04-18 DIAGNOSIS — Z9889 Other specified postprocedural states: Secondary | ICD-10-CM

## 2021-04-18 DIAGNOSIS — E11621 Type 2 diabetes mellitus with foot ulcer: Secondary | ICD-10-CM | POA: Diagnosis present

## 2021-04-18 DIAGNOSIS — K5900 Constipation, unspecified: Secondary | ICD-10-CM | POA: Diagnosis present

## 2021-04-18 DIAGNOSIS — N400 Enlarged prostate without lower urinary tract symptoms: Secondary | ICD-10-CM | POA: Diagnosis present

## 2021-04-18 DIAGNOSIS — G062 Extradural and subdural abscess, unspecified: Secondary | ICD-10-CM | POA: Diagnosis present

## 2021-04-18 DIAGNOSIS — I70261 Atherosclerosis of native arteries of extremities with gangrene, right leg: Secondary | ICD-10-CM | POA: Diagnosis present

## 2021-04-18 DIAGNOSIS — Z7982 Long term (current) use of aspirin: Secondary | ICD-10-CM

## 2021-04-18 DIAGNOSIS — L97529 Non-pressure chronic ulcer of other part of left foot with unspecified severity: Secondary | ICD-10-CM | POA: Diagnosis present

## 2021-04-18 DIAGNOSIS — R0600 Dyspnea, unspecified: Secondary | ICD-10-CM

## 2021-04-18 DIAGNOSIS — N179 Acute kidney failure, unspecified: Secondary | ICD-10-CM | POA: Diagnosis present

## 2021-04-18 DIAGNOSIS — E861 Hypovolemia: Secondary | ICD-10-CM | POA: Diagnosis present

## 2021-04-18 DIAGNOSIS — I129 Hypertensive chronic kidney disease with stage 1 through stage 4 chronic kidney disease, or unspecified chronic kidney disease: Secondary | ICD-10-CM | POA: Diagnosis present

## 2021-04-18 DIAGNOSIS — E1165 Type 2 diabetes mellitus with hyperglycemia: Secondary | ICD-10-CM | POA: Diagnosis present

## 2021-04-18 DIAGNOSIS — L97512 Non-pressure chronic ulcer of other part of right foot with fat layer exposed: Secondary | ICD-10-CM

## 2021-04-18 DIAGNOSIS — Z794 Long term (current) use of insulin: Secondary | ICD-10-CM

## 2021-04-18 DIAGNOSIS — I739 Peripheral vascular disease, unspecified: Secondary | ICD-10-CM | POA: Diagnosis present

## 2021-04-18 DIAGNOSIS — N390 Urinary tract infection, site not specified: Secondary | ICD-10-CM | POA: Diagnosis present

## 2021-04-18 DIAGNOSIS — N183 Chronic kidney disease, stage 3 unspecified: Secondary | ICD-10-CM | POA: Diagnosis present

## 2021-04-18 DIAGNOSIS — A419 Sepsis, unspecified organism: Secondary | ICD-10-CM

## 2021-04-18 LAB — COMPREHENSIVE METABOLIC PANEL
ALT: 39 U/L (ref 0–44)
AST: 38 U/L (ref 15–41)
Albumin: 2.5 g/dL — ABNORMAL LOW (ref 3.5–5.0)
Alkaline Phosphatase: 105 U/L (ref 38–126)
Anion gap: 15 (ref 5–15)
BUN: 32 mg/dL — ABNORMAL HIGH (ref 8–23)
CO2: 19 mmol/L — ABNORMAL LOW (ref 22–32)
Calcium: 8.3 mg/dL — ABNORMAL LOW (ref 8.9–10.3)
Chloride: 91 mmol/L — ABNORMAL LOW (ref 98–111)
Creatinine, Ser: 1.65 mg/dL — ABNORMAL HIGH (ref 0.61–1.24)
GFR, Estimated: 43 mL/min — ABNORMAL LOW (ref >60)
Glucose, Bld: 323 mg/dL — ABNORMAL HIGH (ref 70–99)
Potassium: 4 mmol/L (ref 3.5–5.1)
Sodium: 125 mmol/L — ABNORMAL LOW (ref 135–145)
Total Bilirubin: 1 mg/dL (ref 0.3–1.2)
Total Protein: 6.7 g/dL (ref 6.5–8.1)

## 2021-04-18 LAB — CBG MONITORING, ED
Glucose-Capillary: 325 mg/dL — ABNORMAL HIGH (ref 70–99)
Glucose-Capillary: 373 mg/dL — ABNORMAL HIGH (ref 70–99)

## 2021-04-18 LAB — CBC WITH DIFFERENTIAL/PLATELET
Abs Immature Granulocytes: 0.16 10*3/uL — ABNORMAL HIGH (ref 0.00–0.07)
Basophils Absolute: 0.1 10*3/uL (ref 0.0–0.1)
Basophils Relative: 0 %
Eosinophils Absolute: 0 10*3/uL (ref 0.0–0.5)
Eosinophils Relative: 0 %
HCT: 41.1 % (ref 39.0–52.0)
Hemoglobin: 14.7 g/dL (ref 13.0–17.0)
Immature Granulocytes: 1 %
Lymphocytes Relative: 2 %
Lymphs Abs: 0.4 10*3/uL — ABNORMAL LOW (ref 0.7–4.0)
MCH: 29.5 pg (ref 26.0–34.0)
MCHC: 35.8 g/dL (ref 30.0–36.0)
MCV: 82.5 fL (ref 80.0–100.0)
Monocytes Absolute: 1.1 10*3/uL — ABNORMAL HIGH (ref 0.1–1.0)
Monocytes Relative: 5 %
Neutro Abs: 21.9 10*3/uL — ABNORMAL HIGH (ref 1.7–7.7)
Neutrophils Relative %: 92 %
Platelets: 297 10*3/uL (ref 150–400)
RBC: 4.98 MIL/uL (ref 4.22–5.81)
RDW: 11.9 % (ref 11.5–15.5)
WBC: 23.6 10*3/uL — ABNORMAL HIGH (ref 4.0–10.5)
nRBC: 0 % (ref 0.0–0.2)

## 2021-04-18 LAB — RESPIRATORY PANEL BY PCR

## 2021-04-18 LAB — URINALYSIS, MICROSCOPIC (REFLEX)
RBC / HPF: >50 RBC/hpf (ref 0–5)
WBC, UA: >50 WBC/hpf (ref 0–5)

## 2021-04-18 LAB — SEDIMENTATION RATE: Sed Rate: 86 mm/hr — ABNORMAL HIGH (ref 0–16)

## 2021-04-18 LAB — URINALYSIS, ROUTINE W REFLEX MICROSCOPIC
Bilirubin Urine: NEGATIVE
Glucose, UA: >=500 mg/dL — AB
Ketones, ur: NEGATIVE mg/dL
Nitrite: NEGATIVE
Protein, ur: 100 mg/dL — AB
Specific Gravity, Urine: 1.025 (ref 1.005–1.030)
pH: 5.5 (ref 5.0–8.0)

## 2021-04-18 LAB — APTT: aPTT: 34 seconds (ref 24–36)

## 2021-04-18 LAB — STREP PNEUMONIAE URINARY ANTIGEN: Strep Pneumo Urinary Antigen: NEGATIVE

## 2021-04-18 LAB — RESP PANEL BY RT-PCR (FLU A&B, COVID) ARPGX2
Influenza A by PCR: NEGATIVE
Influenza B by PCR: NEGATIVE
SARS Coronavirus 2 by RT PCR: NEGATIVE

## 2021-04-18 LAB — LACTIC ACID, PLASMA: Lactic Acid, Venous: 1.3 mmol/L (ref 0.5–1.9)

## 2021-04-18 LAB — SODIUM, URINE, RANDOM: Sodium, Ur: 43 mmol/L

## 2021-04-18 LAB — PROTIME-INR
INR: 1.1 (ref 0.8–1.2)
Prothrombin Time: 14.2 seconds (ref 11.4–15.2)

## 2021-04-18 LAB — TSH: TSH: 1.263 u[IU]/mL (ref 0.350–4.500)

## 2021-04-18 LAB — C-REACTIVE PROTEIN: CRP: 22.8 mg/dL — ABNORMAL HIGH (ref <1.0)

## 2021-04-18 LAB — PROCALCITONIN: Procalcitonin: 0.83 ng/mL

## 2021-04-18 MED ORDER — INSULIN GLARGINE-YFGN 100 UNIT/ML ~~LOC~~ SOLN
20.0000 [IU] | Freq: Every day | SUBCUTANEOUS | Status: DC
  Administered 2021-04-18 – 2021-04-22 (×5): 20 [IU] via SUBCUTANEOUS
  Filled 2021-04-18 (×5): qty 0.2

## 2021-04-18 MED ORDER — HYDRALAZINE HCL 20 MG/ML IJ SOLN: 5.0000 mg | Freq: Three times a day (TID) | INTRAMUSCULAR | Status: DC | PRN

## 2021-04-18 MED ORDER — SODIUM CHLORIDE 0.9 % IV SOLN
2.0000 g | INTRAVENOUS | Status: DC
  Administered 2021-04-19: 2 g via INTRAVENOUS
  Filled 2021-04-18: qty 20

## 2021-04-18 MED ORDER — IPRATROPIUM-ALBUTEROL 0.5-2.5 (3) MG/3ML IN SOLN
3.0000 mL | Freq: Four times a day (QID) | RESPIRATORY_TRACT | Status: DC | PRN
  Administered 2021-04-19: 02:00:00 3 mL via RESPIRATORY_TRACT
  Filled 2021-04-18: qty 3

## 2021-04-18 MED ORDER — VANCOMYCIN HCL 1750 MG/350ML IV SOLN
1750.0000 mg | Freq: Once | INTRAVENOUS | Status: AC
  Administered 2021-04-18: 1750 mg via INTRAVENOUS
  Filled 2021-04-18: qty 350

## 2021-04-18 MED ORDER — LACTATED RINGERS IV BOLUS (SEPSIS): 1000.0000 mL | Freq: Once | INTRAVENOUS | Status: DC

## 2021-04-18 MED ORDER — GUAIFENESIN-DM 100-10 MG/5ML PO SYRP
5.0000 mL | ORAL_SOLUTION | ORAL | Status: DC | PRN
  Administered 2021-04-20: 5 mL via ORAL
  Filled 2021-04-18: qty 5

## 2021-04-18 MED ORDER — LACTATED RINGERS IV BOLUS (SEPSIS)
1000.0000 mL | Freq: Once | INTRAVENOUS | Status: AC
  Administered 2021-04-18: 1000 mL via INTRAVENOUS

## 2021-04-18 MED ORDER — SODIUM CHLORIDE 0.9 % IV SOLN
2.0000 g | Freq: Once | INTRAVENOUS | Status: AC
  Administered 2021-04-18: 2 g via INTRAVENOUS
  Filled 2021-04-18: qty 2

## 2021-04-18 MED ORDER — VANCOMYCIN HCL IN DEXTROSE 1-5 GM/200ML-% IV SOLN: 1000.0000 mg | Freq: Once | INTRAVENOUS | Status: DC

## 2021-04-18 MED ORDER — METRONIDAZOLE 500 MG/100ML IV SOLN
500.0000 mg | Freq: Once | INTRAVENOUS | Status: AC
  Administered 2021-04-18: 500 mg via INTRAVENOUS
  Filled 2021-04-18: qty 100

## 2021-04-18 MED ORDER — INSULIN ASPART 100 UNIT/ML IJ SOLN
0.0000 [IU] | Freq: Three times a day (TID) | INTRAMUSCULAR | Status: DC
  Administered 2021-04-19: 14:00:00 7 [IU] via SUBCUTANEOUS
  Administered 2021-04-19: 08:00:00 9 [IU] via SUBCUTANEOUS
  Administered 2021-04-20: 3 [IU] via SUBCUTANEOUS
  Administered 2021-04-20: 2 [IU] via SUBCUTANEOUS
  Administered 2021-04-20 – 2021-04-21 (×3): 3 [IU] via SUBCUTANEOUS
  Administered 2021-04-21: 5 [IU] via SUBCUTANEOUS
  Administered 2021-04-22: 2 [IU] via SUBCUTANEOUS
  Administered 2021-04-22: 3 [IU] via SUBCUTANEOUS

## 2021-04-18 MED ORDER — ACETAMINOPHEN 650 MG RE SUPP: 650.0000 mg | Freq: Four times a day (QID) | RECTAL | Status: DC | PRN

## 2021-04-18 MED ORDER — HYDROCODONE-ACETAMINOPHEN 5-325 MG PO TABS
1.0000 | ORAL_TABLET | ORAL | Status: DC | PRN
  Administered 2021-04-18 – 2021-04-19 (×2): 1 via ORAL
  Administered 2021-04-20 – 2021-04-23 (×9): 2 via ORAL
  Administered 2021-04-24: 1 via ORAL
  Administered 2021-04-24: 2 via ORAL
  Administered 2021-04-24: 1 via ORAL
  Administered 2021-04-25 – 2021-04-28 (×7): 2 via ORAL
  Filled 2021-04-18 (×12): qty 2
  Filled 2021-04-18: qty 1
  Filled 2021-04-18 (×4): qty 2
  Filled 2021-04-18 (×2): qty 1
  Filled 2021-04-18 (×2): qty 2
  Filled 2021-04-18: qty 1
  Filled 2021-04-18 (×4): qty 2

## 2021-04-18 MED ORDER — SODIUM CHLORIDE 0.9 % IV SOLN
500.0000 mg | INTRAVENOUS | Status: DC
  Administered 2021-04-18: 500 mg via INTRAVENOUS
  Filled 2021-04-18: qty 5

## 2021-04-18 MED ORDER — SODIUM CHLORIDE 0.9 % IV SOLN: 2.0000 g | Freq: Two times a day (BID) | INTRAVENOUS | Status: DC

## 2021-04-18 MED ORDER — VANCOMYCIN HCL 750 MG/150ML IV SOLN
750.0000 mg | INTRAVENOUS | Status: DC
  Administered 2021-04-19: 750 mg via INTRAVENOUS
  Filled 2021-04-18: qty 150

## 2021-04-18 MED ORDER — DILTIAZEM HCL ER COATED BEADS 120 MG PO CP24
120.0000 mg | ORAL_CAPSULE | Freq: Every day | ORAL | Status: DC
  Administered 2021-04-19 – 2021-04-29 (×11): 120 mg via ORAL
  Filled 2021-04-18 (×12): qty 1

## 2021-04-18 MED ORDER — LACTATED RINGERS IV SOLN: INTRAVENOUS | Status: AC

## 2021-04-18 MED ORDER — ACETAMINOPHEN 325 MG PO TABS
650.0000 mg | ORAL_TABLET | Freq: Four times a day (QID) | ORAL | Status: DC | PRN
  Administered 2021-04-19 – 2021-04-22 (×2): 650 mg via ORAL
  Filled 2021-04-18 (×2): qty 2

## 2021-04-18 NOTE — Assessment & Plan Note (Addendum)
-    hemoglobin A1c 8.5 on 1/10  -CBG stable, continue Semglee 24 units daily, moderate SSI, NovoLog 3 units 3 times daily AC   Recent Labs    04/26/21 1134 04/26/21 1421 04/26/21 1647 04/26/21 2239 04/27/21 0722 04/27/21 1121  GLUCAP 171* 129* 177* 108* 114* 123*

## 2021-04-18 NOTE — Sepsis Progress Note (Signed)
Elink following code sepsis °

## 2021-04-18 NOTE — Assessment & Plan Note (Addendum)
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

## 2021-04-18 NOTE — Assessment & Plan Note (Addendum)
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 -Also had anion gap metabolic acidosis likely due CKD, was placed on IV fluid hydration.   -Creatinine stable

## 2021-04-18 NOTE — Assessment & Plan Note (Addendum)
-  Declined statin

## 2021-04-18 NOTE — Assessment & Plan Note (Addendum)
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 Cannon Kettle on 1/13, recommended conservative management and wound care for now. Outpatient follow-up with vascular surgery.

## 2021-04-18 NOTE — Progress Notes (Signed)
Pharmacy Antibiotic Note  Zachary [name] is a 76 y.o. male admitted on 04/18/2021 presenting with cough, SOB, concern for sepsis.  Pharmacy has been consulted for vancomycin and cefepime dosing.  Plan: Vancomycin 1750 mg IV x 1, then 750 mg IV q 24h (eAUC 479, Goal AUC 400-550, SCr 1.65) Cefepime 2g IV q 12 hours Monitor renal function, Cx and clinical progression to narrow Vancomycin levels as needed     Temp (24hrs), Avg:99.7 F (37.6 C), Min:99.7 F (37.6 C), Max:99.7 F (37.6 C)  Recent Labs  Lab 04/14/21 0724 04/18/21 1635  WBC 14.3* 23.6*  CREATININE 1.93* 1.65*  LATICACIDVEN  --  1.3    Estimated Creatinine Clearance: 41 mL/min (A) (by C-G formula based on SCr of 1.65 mg/dL (H)).    Allergies  Allergen Reactions   Metformin Hcl Other (See Comments)    Unknown reaction   Pravastatin Sodium Other (See Comments)    Muscle pain    Bertis Ruddy, PharmD Clinical Pharmacist ED Pharmacist Phone # [phone] 04/18/2021 5:54 PM

## 2021-04-18 NOTE — Assessment & Plan Note (Addendum)
-   Recent angiogram to right leg on 04/14/21 by IR, no signs of infection at incision site. He had progressive pain in both legs, Dr Rogers Blocker had discussed with IR, Dr. Annamaria Boots.  - Does not appear to have any pending podiatry procedures, will resume aspirin and Plavix. - Refuses statin. Patient is scheduled to see IR and vascular sx outpatient.

## 2021-04-18 NOTE — ED Provider Notes (Signed)
[facility] EMERGENCY DEPARTMENT Provider Note   CSN: [id number] Arrival date & time: 04/18/21  1611     History  Chief Complaint  Patient presents with   Fever   Weakness   Shortness of Breath   Cough    Since 1/3 evening    Zachary [name] is a 76 y.o. male with a past medical history significant for hypertension, hyperlipidemia, diabetes, stage III CKD peripheral vascular disease who presents to the ED as a code sepsis.  Sepsis initiated by EMS prior to arrival.  Per EMS, patient's son noted patient to have a cough, shortness of breath, and fever for the past few days.  Patient denies any complaints during my initial evaluation.  Per EMS, patient son also concerned about possible confusion.  Patient had a right lower extremity angiogram on 1/4. Patient has wounds on his bilateral toes that have been present for the past few weeks per patient.  Patient is followed by Dr. Posey Pronto with podiatry. Patient denies chest pain. He also notes he has had hiccups for the past week.        Home Medications Prior to Admission medications   Medication Sig Start Date End Date Taking? Authorizing Provider  acetaminophen (TYLENOL) 500 MG tablet Take 1,000 mg by mouth every 8 (eight) hours as needed for moderate pain.   Yes [provider]  aspirin EC 81 MG tablet Take 81 mg by mouth daily.   Yes [provider]  calcium carbonate (TUMS - DOSED IN MG ELEMENTAL CALCIUM) 500 MG chewable tablet Chew 1,000 mg by mouth daily as needed for indigestion or heartburn.   Yes [provider]  clopidogrel (PLAVIX) 75 MG tablet Take 75 mg by mouth daily.   Yes [provider]  diltiazem (CARDIZEM CD) 120 MG 24 hr capsule Take 120 mg by mouth daily. 03/16/20  Yes [provider]  Insulin Glargine-Lixisenatide (SOLIQUA) 100-33 UNT-MCG/ML SOPN Inject 50 Units into the skin daily.   Yes [provider]  B-D ULTRAFINE III SHORT PEN 31G X 8 MM MISC  Inject into the skin 5 (five) times daily. 11/26/19   [provider]  collagenase (SANTYL) ointment Apply 1 application topically daily. Patient not taking: Reported on 02/25/2021 01/15/21   [name], [name]  doxycycline (VIBRA-TABS) 100 MG tablet Take 1 tablet (100 mg total) by mouth 2 (two) times daily. Patient not taking: Reported on 02/25/2021 01/15/21   [name], [name]  glucose blood (FREESTYLE LITE) test strip TEST 3 TIMES A DAY E11.9    [provider]  Lancets (FREESTYLE) lancets AS DIRECTED THREE TIMES A DAY    [provider]      Allergies    Metformin hcl and Pravastatin sodium    Review of Systems   Review of Systems  Constitutional:  Positive for chills and fever.  Respiratory:  Positive for cough and shortness of breath.   Cardiovascular:  Negative for chest pain.  Gastrointestinal:  Negative for abdominal pain, diarrhea, nausea and vomiting.  Genitourinary:  Negative for dysuria.  All other systems reviewed and are negative.  Physical Exam Updated Vital Signs BP (!) 160/75    Pulse 95    Temp 99.7 F (37.6 C) (Oral)    Resp (!) 25    SpO2 95%  Physical Exam Vitals and nursing note reviewed.  Constitutional:      General: He is not in acute distress.    Appearance: He is not ill-appearing.  HENT:     Head: Normocephalic.  Eyes:     Pupils: Pupils are equal, round, and reactive to light.  Cardiovascular:     Rate and Rhythm: Normal rate and regular rhythm.     Pulses: Normal pulses.     Heart sounds: Normal heart sounds. No murmur heard.   No friction rub. No gallop.  Pulmonary:     Effort: Pulmonary effort is normal.     Breath sounds: Normal breath sounds.  Abdominal:     General: Abdomen is flat. There is no distension.     Palpations: Abdomen is soft.     Tenderness: There is no abdominal tenderness. There is no guarding or rebound.  Musculoskeletal:        General: Normal range of motion.     Cervical back: Neck  supple.  Skin:    General: Skin is warm and dry.     Comments: Right great toe ulcer with surrounding erythema. Left toe ulcer. See photos below.   Neurological:     General: No focal deficit present.     Mental Status: He is alert.  Psychiatric:        Mood and Affect: Mood normal.        Behavior: Behavior normal.       ED Results / Procedures / Treatments   Labs (all labs ordered are listed, but only abnormal results are displayed) Labs Reviewed  COMPREHENSIVE METABOLIC PANEL - Abnormal; Notable for the following components:      Result Value   Sodium 125 (*)    Chloride 91 (*)    CO2 19 (*)    Glucose, Bld 323 (*)    BUN 32 (*)    Creatinine, Ser 1.65 (*)    Calcium 8.3 (*)    Albumin 2.5 (*)    GFR, Estimated 43 (*)    All other components within normal limits  CBC WITH DIFFERENTIAL/PLATELET - Abnormal; Notable for the following components:   WBC 23.6 (*)    Neutro Abs 21.9 (*)    Lymphs Abs 0.4 (*)    Monocytes Absolute 1.1 (*)    Abs Immature Granulocytes 0.16 (*)    All other components within normal limits  URINALYSIS, ROUTINE W REFLEX MICROSCOPIC - Abnormal; Notable for the following components:   APPearance CLOUDY (*)    Glucose, UA >=500 (*)    Hgb urine dipstick LARGE (*)    Protein, ur 100 (*)    Leukocytes,Ua SMALL (*)    All other components within normal limits  URINALYSIS, MICROSCOPIC (REFLEX) - Abnormal; Notable for the following components:   Bacteria, UA MANY (*)    All other components within normal limits  RESP PANEL BY RT-PCR (FLU A&B, COVID) ARPGX2  CULTURE, BLOOD (ROUTINE X 2)  CULTURE, BLOOD (ROUTINE X 2)  URINE CULTURE  LACTIC ACID, PLASMA  PROTIME-INR  APTT  C-REACTIVE PROTEIN  SEDIMENTATION RATE  PROCALCITONIN  PROCALCITONIN    EKG EKG Interpretation  Date/Time:  Sunday April 18 2021 16:50:19 EST Ventricular Rate:  92 PR Interval:  120 QRS Duration: 115 QT Interval:  359 QTC Calculation: 445 R Axis:   63 Text  Interpretation: Sinus rhythm Nonspecific intraventricular conduction delay Anterior infarct, old No significant change since last tracing Confirmed by Blanchie Dessert [phone]) on 04/18/2021 5:14:39 PM  Radiology DG Chest Port 1 View  Result Date: 04/18/2021 CLINICAL DATA:  Questionable sepsis - evaluate for abnormality Weakness, fever, shortness of breath, cough. EXAM: PORTABLE CHEST 1  VIEW COMPARISON:  Radiograph 11/27/2011 FINDINGS: Lung volumes are low. Mild cardiomegaly, likely accentuated by technique. Normal mediastinal contours with aortic atherosclerosis. Mild patchy opacity at the left lung base. No large pleural effusion. No pneumothorax. No pulmonary edema. No acute osseous abnormalities are seen. IMPRESSION: 1. Patchy opacity at the left lung base, may represent atelectasis or pneumonia. 2. Mild cardiomegaly, likely accentuated by technique. Electronically Signed   By: Keith Rake M.D.   On: 04/18/2021 17:17   DG Foot Complete Left  Result Date: 04/18/2021 CLINICAL DATA:  Wound. EXAM: LEFT FOOT - COMPLETE 3+ VIEW COMPARISON:  No prior foot exams. FINDINGS: Prior resection of the first and second toes. There is a fracture of the third toe proximal phalanx of unknown acuity. Soft tissue defect subjacent to the metatarsal phalangeal joint joints, seen on the lateral view, tentatively visualized overlying the 1st-2nd intertarsal space. Subcortical cystic change about the distal metatarsal head but no frank erosions. Chronic degenerative change of the midfoot. Large plantar calcaneal spur and Achilles tendon enthesophyte. IMPRESSION: 1. Soft tissue defect subjacent to the metatarsophalangeal joints, tentatively visualized overlying the 1st-2nd intertarsal space, likely representing site of wound. 2. Prior resection of the first and second toes. Subcortical cystic change of the first metatarsal head is of unknown acuity, favor chronic. No definite radiographic findings of osteomyelitis. 3. Fracture  of the third toe proximal phalanx of unknown acuity. Electronically Signed   By: Keith Rake M.D.   On: 04/18/2021 17:24   DG Toe Great Right  Result Date: 04/18/2021 CLINICAL DATA:  Wound. EXAM: RIGHT GREAT TOE COMPARISON:  Foot radiograph 09/14/2019 FINDINGS: Decreased density the medial aspect of the great toe distal phalanx, with overlying thinning of the soft tissues, level pathologic fracture. There are small erosions about the medial aspect of the first metatarsal phalangeal joint. Osteoarthritis of the first metatarsal phalangeal joint. Diffuse soft tissue edema about the proximal digit. Prior resection of the second toe. No radiopaque foreign body. IMPRESSION: 1. 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 2. Small erosions about the medial aspect first metatarsophalangeal joint. 3. Diffuse soft tissue edema about the proximal digit. Electronically Signed   By: Keith Rake M.D.   On: 04/18/2021 17:20    Procedures Procedures    Medications Ordered in ED Medications  lactated ringers infusion ( Intravenous New Bag/Given 04/18/21 1823)  ceFEPIme (MAXIPIME) 2 g in sodium chloride 0.9 % 100 mL IVPB (2 g Intravenous New Bag/Given 04/18/21 1822)  vancomycin (VANCOREADY) IVPB 1750 mg/350 mL (has no administration in time range)  vancomycin (VANCOREADY) IVPB 750 mg/150 mL (has no administration in time range)  lactated ringers bolus 1,000 mL (1,000 mLs Intravenous New Bag/Given 04/18/21 1711)    And  lactated ringers bolus 1,000 mL (1,000 mLs Intravenous New Bag/Given 04/18/21 1710)  metroNIDAZOLE (FLAGYL) IVPB 500 mg (0 mg Intravenous Stopped 04/18/21 1810)    ED Course/ Medical Decision Making/ A&P Clinical Course as of 04/18/21 1836  Sun Apr 18, 2021  1734 WBC(!): 23.6 [CA]    Clinical Course User Index [CA] Suzy Bouchard, PA-C                           Medical Decision Making Amount and/or Complexity of Data  Reviewed Independent Historian: caregiver, spouse and EMS External Data Reviewed: labs and notes. Labs: ordered. Decision-making details documented in ED Course. Radiology: ordered and independent interpretation performed. Decision-making details documented in  ED Course. ECG/medicine tests: ordered and independent interpretation performed. Decision-making details documented in ED Course.  Risk Decision regarding hospitalization.  Critical Care Total time providing critical care: 30-74 minutes  76 year old male presents the ED as a code sepsis.  Patient endorses a cough and shortness of breath for the past few days.  He also has chronic lower extremity wounds.  History of diabetes.  Patient is followed by Dr. Posey Pronto with podiatry.  He had a right lower extremity angiogram on 1/4.  He denies chest pain.  Per EMS, patient febrile and tachycardic. On exam, crackles in left lower lobe concerning for possible pneumonia. Bilateral toes wounds.  Sepsis labs ordered.  IV fluids and antibiotics started.  X-ray of bilateral feet to rule out signs of osteomyelitis given wounds. CXR to rule out pneumonia.  CXR independently reviewed and interpreted which demonstrates possible left lower lobe pneumonia.  Agree with radiology interpretation. Right toe x-ray concerning for osteomyelitis. CBC significant for leukocytosis at 23.6.  Normal hemoglobin.  UA significant for small leukocytes and many bacteria.  Large hematuria and proteinuria.  CMP significant for hyperglycemia 323.  No evidence of DKA.  Elevated creatinine at 1.65 and BUN at 32 which appears to be around patient's baseline.  Patient hyponatremic at 125.  Lactic acid normal.  Suspect sepsis secondary to pneumonia. Patient given IVFs and antibiotics after initial evaluation. Will consult hospitalist for admission. COVID pending.    Co morbidities that complicate the patient evaluation  DM, HTN, HL   Additional history obtained:  Additional history  obtained from EMS, son, and wife External records from outside source obtained and reviewed including outpatient podiatry notes  Cardiac Monitoring:  The patient was maintained on a cardiac monitor.  I personally viewed and interpreted the cardiac monitored which showed an underlying rhythm of: NSR with no signs of acute ischemia   Medicines ordered and prescription drug management:  I ordered medication including IVF and antibiotics for sepsis Reevaluation of the patient after these medicines showed that the patient stayed the same I have reviewed the patients home medicines and have made adjustments as needed  6:34 PM Discussed with Dr. Rogers Blocker with [facility] who agrees to admit patient for further treatment of sepsis.         Final Clinical Impression(s) / ED Diagnoses Final diagnoses:  Sepsis, due to unspecified organism, unspecified whether acute organ dysfunction present [facility])    Rx / DC Orders ED Discharge Orders     None         Karie Kirks 04/18/21 1836    Blanchie Dessert, MD 04/18/21 1935

## 2021-04-18 NOTE — H&P (Signed)
History and Physical    Zachary [name]:[id number] DOB: 02-03-1946 DOA: 04/18/2021  PCP: [name], FNP Consultants:  podiatry: Dr. Posey Pronto  Patient coming from:  Home - lives with his wife   Chief Complaint: bilateral leg pain   HPI: Zachary [name] is a 76 y.o. male with medical history significant of T2DM, HLD, HTN, PVD, BPH who presented to ED with complaints of bilateral leg pain. He had  aorto-peripheral angiogram on 04/14/21 by IR. After the procedure he started to have pain in his legs. Pain is over entire leg and rated as an 8/10. Described as stabbing. Pain is constant and tylenol does help. He denies any bleeding or drainage from procedure site. He also has had right lower back pain for the past few days as well.   He denies any fever/chills, headaches/vision changes, chest pain/palpitations, shortness of breath, stomach pain, N/V/D, dysuria, leg swelling. He does have a dry cough that started about 1.5 weeks ago. He has been drinking well, not eating as well. Deny any sick contacts.    ED Course: vitals: temperature: 99.7, bp: 145/64, HR: 91, RR: 24, oxygen: 96% on RA Pertinent labs: wbc: 23.6, sodium: 125, glucose: 323, BUN: 32, creatinine: 1.65,  CXR: patchy opacity at Left lung base, atelectasis vs. Pneumonia, mild cardiomegaly Right toe xray: suspicious for osteo with pathological fracture. Small erosions about the medial aspect of the first metatarsophalangeal joint. Soft tissue edema.  Right foot xray: soft tissue defect subjacent to the metatarsophalangeal joints, likely representing a wound site. Fracture of the third toe proximal phalanx of unknown acuity.  In ED: code sepsis called. Started on vanc/cefepime/flagyl, given IVF bolus and maintenance fluids. Cultures pending. TRH was asked to admit.   Review of Systems: As per HPI; otherwise review of systems reviewed and negative.   Ambulatory Status:  Ambulates without assistance, has been using walker since last  week after he had vascular procedures done with IR on 04/14/21.    Past Medical History:  Diagnosis Date   Diabetes mellitus    Hypercholesteremia    Hypertension    Patella fracture    left   PVD (peripheral vascular disease) ([place])     Past Surgical History:  Procedure Laterality Date   AMPUTATION     AMPUTATION  11/28/2011   Procedure: AMPUTATION RAY;  Surgeon: [name], MD;  Location: [facility];  Service: Orthopedics;  Laterality: Left;  left 2nd toe amputation   AMPUTATION TOE Right 09/14/2019   Procedure: AMPUTATION TOE RIGHT SECOND TOE;  Surgeon: [name], [name];  Location: [facility];  Service: Podiatry;  Laterality: Right;   IR ANGIOGRAM EXTREMITY BILATERAL  03/03/2021   IR ANGIOGRAM EXTREMITY RIGHT  03/03/2021   IR ANGIOGRAM EXTREMITY RIGHT  04/14/2021   IR FEM POP ART PTA MOD SED  03/03/2021   IR INTRAVASCULAR ULTRASOUND NON CORONARY  03/03/2021   IR RADIOLOGIST EVAL & MGMT  09/27/2019   IR RADIOLOGIST EVAL & MGMT  12/18/2019   IR RADIOLOGIST EVAL & MGMT  02/11/2021   IR RADIOLOGIST EVAL & MGMT  04/09/2021   IR TIB-PERO ART ATHEREC INC PTA MOD SED  03/03/2021   IR TIB-PERO ART PTA MOD SED  04/14/2021   IR US GUIDE VASC ACCESS LEFT  03/03/2021   IR US GUIDE VASC ACCESS RIGHT  04/14/2021   ORIF PATELLA Left 01/10/2019   Procedure: OPEN REDUCTION INTERNAL (ORIF) FIXATION LEFT PATELLA FRACTURE;  Surgeon: [name], MD;  Location: [facility]  [place];  Service: Orthopedics;  Laterality: Left;    Social History   Socioeconomic History   Marital status: Married    Spouse name: Not on file   Number of children: Not on file   Years of education: Not on file   Highest education level: Not on file  Occupational History   Not on file  Tobacco Use   Smoking status: Never   Smokeless tobacco: Never  Substance and Sexual Activity   Alcohol use: No   Drug use: No   Sexual activity: Not on file  Other Topics Concern   Not on file  Social History Narrative   Not on  file   Social Determinants of Health   Financial Resource Strain: Not on file  Food Insecurity: Not on file  Transportation Needs: Not on file  Physical Activity: Not on file  Stress: Not on file  Social Connections: Not on file  Intimate Partner Violence: Not on file    Allergies  Allergen Reactions   Metformin Hcl Other (See Comments)    Unknown reaction   Pravastatin Sodium Other (See Comments)    Muscle pain     No family history on file.  Prior to Admission medications   Medication Sig Start Date End Date Taking? Authorizing Provider  acetaminophen (TYLENOL) 500 MG tablet Take 1,000 mg by mouth every 8 (eight) hours as needed for moderate pain.   Yes [provider]  aspirin EC 81 MG tablet Take 81 mg by mouth daily.   Yes [provider]  calcium carbonate (TUMS - DOSED IN MG ELEMENTAL CALCIUM) 500 MG chewable tablet Chew 1,000 mg by mouth daily as needed for indigestion or heartburn.   Yes [provider]  clopidogrel (PLAVIX) 75 MG tablet Take 75 mg by mouth daily.   Yes [provider]  diltiazem (CARDIZEM CD) 120 MG 24 hr capsule Take 120 mg by mouth daily. 03/16/20  Yes [provider]  Insulin Glargine-Lixisenatide (SOLIQUA) 100-33 UNT-MCG/ML SOPN Inject 50 Units into the skin daily.   Yes [provider]  B-D ULTRAFINE III SHORT PEN 31G X 8 MM MISC Inject into the skin 5 (five) times daily. 11/26/19   [provider]  collagenase (SANTYL) ointment Apply 1 application topically daily. Patient not taking: Reported on 02/25/2021 01/15/21   [name], [name]  doxycycline (VIBRA-TABS) 100 MG tablet Take 1 tablet (100 mg total) by mouth 2 (two) times daily. Patient not taking: Reported on 02/25/2021 01/15/21   [name], [name]  glucose blood (FREESTYLE LITE) test strip TEST 3 TIMES A DAY E11.9    [provider]  Lancets (FREESTYLE) lancets AS DIRECTED THREE TIMES A DAY    [provider]     Physical Exam: Vitals:   04/18/21 1700 04/18/21 1715 04/18/21 1730 04/18/21 1745  BP: (!) 149/61 (!) 144/91 (!) 146/79 (!) 160/75  Pulse: 93 90 95 95  Resp: (!) 25 (!) 25 (!) 25 (!) 25  Temp:      TempSrc:      SpO2: 96% 96% 96% 95%     General:  Appears calm and comfortable and is in NAD Eyes:  PERRL, EOMI, normal lids, iris ENT:  grossly normal hearing, lips & tongue, mmm; no teeth  Neck:  no LAD, masses or thyromegaly; no carotid bruits Cardiovascular:  RRR, systolic murmur. No LE edema.  Respiratory:   decreased breath sounds in RLL, no wheezing/rales/rhonchi  Normal respiratory effort. Abdomen:  soft, NT, ND, NABS Back:   normal alignment, no CVAT Skin:  no rash or induration seen on limited exam. Large bruise at right groin. No erythema/drainage  Musculoskeletal:  grossly normal tone BUE/BLE, good ROM, no bony abnormality Lower extremity:  No LE edema.  Right great toe with large gangrenous ulcer. Foul smelling, purulent drainage, probe about 4-7mm.   2+ distal pulses on right, thready/absent pedal pulses left.    Psychiatric:  grossly normal mood and affect, speech fluent and appropriate, AOx3 Neurologic:  CN 2-12 grossly intact, moves all extremities in coordinated fashion, sensation intact    Radiological Exams on Admission: Independently reviewed - see discussion in A/P where applicable  DG Chest Port 1 View  Result Date: 04/18/2021 CLINICAL DATA:  Questionable sepsis - evaluate for abnormality Weakness, fever, shortness of breath, cough. EXAM: PORTABLE CHEST 1 VIEW COMPARISON:  Radiograph 11/27/2011 FINDINGS: Lung volumes are low. Mild cardiomegaly, likely accentuated by technique. Normal mediastinal contours with aortic atherosclerosis. Mild patchy opacity at the left lung base. No large pleural effusion. No pneumothorax. No pulmonary edema. No acute osseous abnormalities are seen. IMPRESSION: 1. Patchy opacity at the left lung base, may represent atelectasis or  pneumonia. 2. Mild cardiomegaly, likely accentuated by technique. Electronically Signed   By: Keith Rake M.D.   On: 04/18/2021 17:17   DG Foot Complete Left  Result Date: 04/18/2021 CLINICAL DATA:  Wound. EXAM: LEFT FOOT - COMPLETE 3+ VIEW COMPARISON:  No prior foot exams. FINDINGS: Prior resection of the first and second toes. There is a fracture of the third toe proximal phalanx of unknown acuity. Soft tissue defect subjacent to the metatarsal phalangeal joint joints, seen on the lateral view, tentatively visualized overlying the 1st-2nd intertarsal space. Subcortical cystic change about the distal metatarsal head but no frank erosions. Chronic degenerative change of the midfoot. Large plantar calcaneal spur and Achilles tendon enthesophyte. IMPRESSION: 1. Soft tissue defect subjacent to the metatarsophalangeal joints, tentatively visualized overlying the 1st-2nd intertarsal space, likely representing site of wound. 2. Prior resection of the first and second toes. Subcortical cystic change of the first metatarsal head is of unknown acuity, favor chronic. No definite radiographic findings of osteomyelitis. 3. Fracture of the third toe proximal phalanx of unknown acuity. Electronically Signed   By: Keith Rake M.D.   On: 04/18/2021 17:24   DG Toe Great Right  Result Date: 04/18/2021 CLINICAL DATA:  Wound. EXAM: RIGHT GREAT TOE COMPARISON:  Foot radiograph 09/14/2019 FINDINGS: Decreased density the medial aspect of the great toe distal phalanx, with overlying thinning of the soft tissues, level pathologic fracture. There are small erosions about the medial aspect of the first metatarsal phalangeal joint. Osteoarthritis of the first metatarsal phalangeal joint. Diffuse soft tissue edema about the proximal digit. Prior resection of the second toe. No radiopaque foreign body. IMPRESSION: 1. 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 2. Small erosions  about the medial aspect first metatarsophalangeal joint. 3. Diffuse soft tissue edema about the proximal digit. Electronically Signed   By: Keith Rake M.D.   On: 04/18/2021 17:20    EKG: Independently reviewed.  NSR with rate 92; nonspecific ST changes with no evidence of acute ischemia   Labs on Admission: I have personally reviewed the available labs and imaging studies at the time of the admission.  Pertinent labs:  wbc: 23.6,  sodium: 125, (corrected: 130) glucose: 323,  BUN: 32,  creatinine: 1.65 Lactic acid: 1.3   Assessment/Plan sepsis  secondary to pneumonia vs. osteomyelitis of rigth great toe - (present on admission) 76 year old male presenting with worsening bilateral leg pain and cough with known gangrenous right toe found to be septic with white count of 23, tachycardia and tachypnea source osteo vs. CAP -place on telemetry -initially given vanc/cefepime and flagyl>change this to vanc/rocephin and azithromycin to cover for osteo/CAP interestingly he has back pain on RLL and decreased breath sounds on RLL; however, opacity is seen on LLL.  -dry cough, no shortness of breath findings on CXR for possible pna. Does have hiccups -viral vs. Bacterial-check resp. Panel  -will check procalcitonin, abx as above -urinary antigen -sputum cx, cough is dry though -duonebs prn -gentle IVF  -robitussin prn -trend cbc   Osteomyelitis of great toe of right foot ([place])- (present on admission) -podiatry consulted, Dr. 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 Typically on 50 units, will start at 20 units as he has had poor PO intake and start him  on SSI Glucose high on admit 351>323. No AG, no ketones.  Routine accuchecks per protocol   Peripheral vascular disease ([place])- (present on admission) Recent angiogram to right leg on 04/14/21 by IR, no signs of infection at incision site Has had progressive pain in both legs, called IR, Dr. Annamaria Boots, and discussed and if no infection to incision site not contributing to sepsis.  Holding plavix/ASA as he may need surgery on his toe Refuses statin  Hyponatremia- (present on admission) Correction to 130 with hyperglycemia  Likely secondary to hypovolemia hyponatremia IVF resuscitation in ED with 2L bolus then maintenance IVF Continue gentle hydration and check urine sodium  Trend   Essential hypertension- (present on admission) Slightly above goal. Has had his cardizem today.  Continue home medication and will do PRN IV hydralazine.   Hyperlipidemia- (present on admission) Refuses to take statin  Stage 3 chronic kidney disease ([place])- (present on admission) Baseline ? (1.4-1.8) At baseline today, continue to monitor Watch I/O      There is no height or weight on file to calculate BMI.   Level of care: Telemetry Medical DVT prophylaxis:  SCDs Code Status:  DNR - confirmed with patient Family Communication: wife at bedside: Orville Govern  Disposition Plan:  The patient is from: home  Anticipated d/c is to: per day team   Patient placed in observation as anticipate less than 2 midnight stay. Requires hospitalization for sepsis from possible pneumonia vs. Gangrenous toe requiring IVF antibiotics, IVF, close monitoring and possible surgical interventions and MDM with specialists.    Patient is currently: stable  Consults called: podiatry: Dr. [name] Admission status:  observation    Orma Flaming MD [facility]   How to contact the [facility] Attending or Consulting provider [place] or covering provider during after hours [place], for this patient?  Check the care team in [facility]  and look for a) attending/consulting [facility] provider listed and b) the [facility] team listed Log into [email or web address] and use [place]'s universal password to access. If you do not have the password, please contact the hospital operator. Locate the [facility] provider you are looking for under [facility] and page to a number that  you can be directly reached. If you still have difficulty reaching the provider, please page the [facility] (Director on Call) for the Hospitalists listed on amion for assistance.   04/18/2021, 8:03 PM

## 2021-04-18 NOTE — ED Triage Notes (Signed)
Pt arrived via [facility] from home. Per EMS, pt's family called because pt has been c/o increased weakness, SOB, non productive cough, and excessive hiccups since coming home from his surgery last Wednesday night (04/12/21). Pt was febrile with EMS at 100.6 F and borderline tachycardic around 100 bpm, EMS also states rhonchi in R. Lower lung fields. Pt is caox4 on arrival to ED. EMS also advised pt is hyperglycemic and states he has not had his insulin in 2 days.   300 mL NS admin with EMS VS 156/70 BP 102 HR 30 RR 25 capnography  96% SpO2 RA  100.6 temp

## 2021-04-18 NOTE — ED Notes (Signed)
Patient transported to MRI 

## 2021-04-18 NOTE — Assessment & Plan Note (Addendum)
-  Met sepsis criteria at the time of admission with leukocytosis, tachycardia, tachypnea, source gangrenous right toe with osteomyelitis.  -  Underwent right great toe amputation on 1/10. -Continue antibiotics per ID recommendations, changed to IV cefazolin and ciprofloxacin on 1/16

## 2021-04-18 NOTE — Assessment & Plan Note (Addendum)
BP stable.

## 2021-04-19 ENCOUNTER — Telehealth: Payer: Self-pay | Admitting: *Deleted

## 2021-04-19 ENCOUNTER — Ambulatory Visit: Payer: Self-pay | Admitting: Podiatry

## 2021-04-19 ENCOUNTER — Observation Stay (HOSPITAL_COMMUNITY): Payer: Medicare PPO

## 2021-04-19 ENCOUNTER — Encounter (HOSPITAL_COMMUNITY): Payer: Medicare PPO

## 2021-04-19 ENCOUNTER — Inpatient Hospital Stay (HOSPITAL_COMMUNITY): Payer: Medicare PPO

## 2021-04-19 DIAGNOSIS — E1122 Type 2 diabetes mellitus with diabetic chronic kidney disease: Secondary | ICD-10-CM | POA: Diagnosis present

## 2021-04-19 DIAGNOSIS — N390 Urinary tract infection, site not specified: Secondary | ICD-10-CM | POA: Diagnosis present

## 2021-04-19 DIAGNOSIS — I70261 Atherosclerosis of native arteries of extremities with gangrene, right leg: Secondary | ICD-10-CM | POA: Diagnosis present

## 2021-04-19 DIAGNOSIS — L97529 Non-pressure chronic ulcer of other part of left foot with unspecified severity: Secondary | ICD-10-CM | POA: Diagnosis present

## 2021-04-19 DIAGNOSIS — N179 Acute kidney failure, unspecified: Secondary | ICD-10-CM | POA: Diagnosis present

## 2021-04-19 DIAGNOSIS — L97519 Non-pressure chronic ulcer of other part of right foot with unspecified severity: Secondary | ICD-10-CM | POA: Diagnosis present

## 2021-04-19 DIAGNOSIS — N400 Enlarged prostate without lower urinary tract symptoms: Secondary | ICD-10-CM | POA: Diagnosis not present

## 2021-04-19 DIAGNOSIS — B9561 Methicillin susceptible Staphylococcus aureus infection as the cause of diseases classified elsewhere: Secondary | ICD-10-CM

## 2021-04-19 DIAGNOSIS — R7881 Bacteremia: Secondary | ICD-10-CM

## 2021-04-19 DIAGNOSIS — Z7982 Long term (current) use of aspirin: Secondary | ICD-10-CM | POA: Diagnosis not present

## 2021-04-19 DIAGNOSIS — E1169 Type 2 diabetes mellitus with other specified complication: Secondary | ICD-10-CM | POA: Diagnosis present

## 2021-04-19 DIAGNOSIS — Z20822 Contact with and (suspected) exposure to covid-19: Secondary | ICD-10-CM | POA: Diagnosis present

## 2021-04-19 DIAGNOSIS — I342 Nonrheumatic mitral (valve) stenosis: Secondary | ICD-10-CM | POA: Diagnosis not present

## 2021-04-19 DIAGNOSIS — E222 Syndrome of inappropriate secretion of antidiuretic hormone: Secondary | ICD-10-CM | POA: Diagnosis present

## 2021-04-19 DIAGNOSIS — E11621 Type 2 diabetes mellitus with foot ulcer: Secondary | ICD-10-CM | POA: Diagnosis present

## 2021-04-19 DIAGNOSIS — N1832 Chronic kidney disease, stage 3b: Secondary | ICD-10-CM | POA: Diagnosis present

## 2021-04-19 DIAGNOSIS — M869 Osteomyelitis, unspecified: Secondary | ICD-10-CM | POA: Diagnosis present

## 2021-04-19 DIAGNOSIS — Z66 Do not resuscitate: Secondary | ICD-10-CM | POA: Diagnosis present

## 2021-04-19 DIAGNOSIS — J189 Pneumonia, unspecified organism: Secondary | ICD-10-CM | POA: Diagnosis not present

## 2021-04-19 DIAGNOSIS — K5900 Constipation, unspecified: Secondary | ICD-10-CM | POA: Diagnosis not present

## 2021-04-19 DIAGNOSIS — I129 Hypertensive chronic kidney disease with stage 1 through stage 4 chronic kidney disease, or unspecified chronic kidney disease: Secondary | ICD-10-CM | POA: Diagnosis present

## 2021-04-19 DIAGNOSIS — G061 Intraspinal abscess and granuloma: Secondary | ICD-10-CM | POA: Diagnosis present

## 2021-04-19 DIAGNOSIS — E861 Hypovolemia: Secondary | ICD-10-CM | POA: Diagnosis present

## 2021-04-19 DIAGNOSIS — R0600 Dyspnea, unspecified: Secondary | ICD-10-CM | POA: Diagnosis not present

## 2021-04-19 DIAGNOSIS — R1010 Upper abdominal pain, unspecified: Secondary | ICD-10-CM | POA: Diagnosis not present

## 2021-04-19 DIAGNOSIS — I739 Peripheral vascular disease, unspecified: Secondary | ICD-10-CM

## 2021-04-19 DIAGNOSIS — I350 Nonrheumatic aortic (valve) stenosis: Secondary | ICD-10-CM | POA: Diagnosis not present

## 2021-04-19 DIAGNOSIS — E1165 Type 2 diabetes mellitus with hyperglycemia: Secondary | ICD-10-CM | POA: Diagnosis present

## 2021-04-19 DIAGNOSIS — M86171 Other acute osteomyelitis, right ankle and foot: Secondary | ICD-10-CM | POA: Diagnosis not present

## 2021-04-19 DIAGNOSIS — A4101 Sepsis due to Methicillin susceptible Staphylococcus aureus: Secondary | ICD-10-CM | POA: Diagnosis present

## 2021-04-19 DIAGNOSIS — B9620 Unspecified Escherichia coli [E. coli] as the cause of diseases classified elsewhere: Secondary | ICD-10-CM | POA: Diagnosis present

## 2021-04-19 DIAGNOSIS — Z79899 Other long term (current) drug therapy: Secondary | ICD-10-CM | POA: Diagnosis not present

## 2021-04-19 DIAGNOSIS — E1152 Type 2 diabetes mellitus with diabetic peripheral angiopathy with gangrene: Secondary | ICD-10-CM | POA: Diagnosis present

## 2021-04-19 DIAGNOSIS — Z794 Long term (current) use of insulin: Secondary | ICD-10-CM | POA: Diagnosis not present

## 2021-04-19 DIAGNOSIS — E872 Acidosis, unspecified: Secondary | ICD-10-CM | POA: Diagnosis present

## 2021-04-19 LAB — CBG MONITORING, ED
Glucose-Capillary: 404 mg/dL — ABNORMAL HIGH (ref 70–99)
Glucose-Capillary: 450 mg/dL — ABNORMAL HIGH (ref 70–99)
Glucose-Capillary: 395 mg/dL — ABNORMAL HIGH (ref 70–99)
Glucose-Capillary: 346 mg/dL — ABNORMAL HIGH (ref 70–99)

## 2021-04-19 LAB — BLOOD CULTURE ID PANEL (REFLEXED) - BCID2

## 2021-04-19 LAB — BASIC METABOLIC PANEL
Anion gap: 15 (ref 5–15)
BUN: 35 mg/dL — ABNORMAL HIGH (ref 8–23)
CO2: 16 mmol/L — ABNORMAL LOW (ref 22–32)
Calcium: 7.9 mg/dL — ABNORMAL LOW (ref 8.9–10.3)
Chloride: 94 mmol/L — ABNORMAL LOW (ref 98–111)
Creatinine, Ser: 1.81 mg/dL — ABNORMAL HIGH (ref 0.61–1.24)
GFR, Estimated: 38 mL/min — ABNORMAL LOW (ref >60)
Glucose, Bld: 400 mg/dL — ABNORMAL HIGH (ref 70–99)
Potassium: 4.3 mmol/L (ref 3.5–5.1)
Sodium: 125 mmol/L — ABNORMAL LOW (ref 135–145)

## 2021-04-19 LAB — CBC
HCT: 39.7 % (ref 39.0–52.0)
Hemoglobin: 13.9 g/dL (ref 13.0–17.0)
MCH: 29.4 pg (ref 26.0–34.0)
MCHC: 35 g/dL (ref 30.0–36.0)
MCV: 83.9 fL (ref 80.0–100.0)
Platelets: 260 10*3/uL (ref 150–400)
RBC: 4.73 MIL/uL (ref 4.22–5.81)
RDW: 12.1 % (ref 11.5–15.5)
WBC: 25.3 10*3/uL — ABNORMAL HIGH (ref 4.0–10.5)
nRBC: 0 % (ref 0.0–0.2)

## 2021-04-19 LAB — GLUCOSE, CAPILLARY: Glucose-Capillary: 255 mg/dL — ABNORMAL HIGH (ref 70–99)

## 2021-04-19 LAB — PROCALCITONIN: Procalcitonin: 2.62 ng/mL

## 2021-04-19 LAB — MRSA NEXT GEN BY PCR, NASAL: MRSA by PCR Next Gen: NOT DETECTED

## 2021-04-19 MED ORDER — CEFAZOLIN SODIUM-DEXTROSE 2-4 GM/100ML-% IV SOLN
2.0000 g | Freq: Three times a day (TID) | INTRAVENOUS | Status: DC
  Administered 2021-04-19 – 2021-04-22 (×9): 2 g via INTRAVENOUS
  Filled 2021-04-19 (×10): qty 100

## 2021-04-19 MED ORDER — LACTATED RINGERS IV SOLN: INTRAVENOUS | Status: AC

## 2021-04-19 MED ORDER — INSULIN ASPART 100 UNIT/ML IJ SOLN
10.0000 [IU] | INTRAMUSCULAR | Status: AC
  Administered 2021-04-19: 10 [IU] via SUBCUTANEOUS

## 2021-04-19 NOTE — ED Notes (Signed)
Admitting paged d/t pt's increased lethargy, increase in HR and RR, slight decrease in SpO2 and audible wheezing with no improvement from 1 duo neb. Pt's temp increased but still only 100.7 despite pt feeling hot to the touch. Dr. Bridgett Larsson advised he will be ordering a chest xray stat. Rectal temp was obtained which showed 103.4, PO tylenol administered. Pt maintained on 2L O2 via [place] which maintained SpO2 in upper 90's/WNL.

## 2021-04-19 NOTE — Progress Notes (Signed)
Inpatient Diabetes Program Recommendations  AACE/ADA: New Consensus Statement on Inpatient Glycemic Control (2015)  Target Ranges:  Prepandial:   less than 140 mg/dL      Peak postprandial:   less than 180 mg/dL (1-2 hours)      Critically ill patients:  140 - 180 mg/dL   Lab Results  Component Value Date   GLUCAP 395 (H) 04/19/2021   HGBA1C 6.1 (H) 09/13/2019    Review of Glycemic Control  Latest Reference Range & Units 04/14/21 07:05 04/18/21 19:06 04/18/21 21:57 04/19/21 02:01 04/19/21 06:37 04/19/21 09:30  Glucose-Capillary 70 - 99 mg/dL 251 (H) 325 (H) 373 (H) 404 (H) 450 (H) 395 (H)   Diabetes history: DM 2 Outpatient Diabetes medications: Soliqua 50 units Daily Current orders for Inpatient glycemic control:  Semglee 20 units Novolog 0-9 units tid  Inpatient Diabetes Program Recommendations:    -   increase Semglee to 40 units (closer to home dose of 50 units) -  Obtain Hgb A1c  Thanks,  Tama Headings RN, MSN, BC-ADM Inpatient Diabetes Coordinator Team Pager [phone] (8a-5p)

## 2021-04-19 NOTE — Progress Notes (Signed)
MRI reviewed - osteomyelitis of both proximal and distal phalanx but without enhancing fluid abscess or necrotizing infection. Given history of PAD, will await vascular studies prior to surgical intervention. Will cancel NPO for today and plan for surgery tomorrow.  Will continue to follow. Please contact with questions or concerns.  [name], [name]

## 2021-04-19 NOTE — Progress Notes (Signed)
[name] 04/19/2021 1:57 PM

## 2021-04-19 NOTE — Consult Note (Signed)
Reason for Consult:Ulcer, Infection Referring Physician: Dr. Orma Flaming, MD  Zachary [name] is an 76 y.o. male.  HPI:  76 y.o. male with medical history significant of T2DM, HLD, HTN, PVD, BPH who presented to ED with complaints of bilateral leg pain. He had  aorto-peripheral angiogram on 04/14/21 by IR. After the procedure he started to have pain in his legs.  He has wounds to both of his feet.  He is going down to our practice and was seen by Dr. Posey Pronto on April 09, 2021 and found to have worsened at that time.  He underwent angio on 06/04/2021 with Dr. Damita Dunnings, DO.  Podiatry was consulted for the wounds on the feet, sepsis.  Past Medical History:  Diagnosis Date   Diabetes mellitus    Hypercholesteremia    Hypertension    Patella fracture    left   PVD (peripheral vascular disease) ([place])     Past Surgical History:  Procedure Laterality Date   AMPUTATION     AMPUTATION  11/28/2011   Procedure: AMPUTATION RAY;  Surgeon: [name], MD;  Location: [facility];  Service: Orthopedics;  Laterality: Left;  left 2nd toe amputation   AMPUTATION TOE Right 09/14/2019   Procedure: AMPUTATION TOE RIGHT SECOND TOE;  Surgeon: [name], [name];  Location: [facility];  Service: Podiatry;  Laterality: Right;   IR ANGIOGRAM EXTREMITY BILATERAL  03/03/2021   IR ANGIOGRAM EXTREMITY RIGHT  03/03/2021   IR ANGIOGRAM EXTREMITY RIGHT  04/14/2021   IR FEM POP ART PTA MOD SED  03/03/2021   IR INTRAVASCULAR ULTRASOUND NON CORONARY  03/03/2021   IR RADIOLOGIST EVAL & MGMT  09/27/2019   IR RADIOLOGIST EVAL & MGMT  12/18/2019   IR RADIOLOGIST EVAL & MGMT  02/11/2021   IR RADIOLOGIST EVAL & MGMT  04/09/2021   IR TIB-PERO ART ATHEREC INC PTA MOD SED  03/03/2021   IR TIB-PERO ART PTA MOD SED  04/14/2021   IR US GUIDE VASC ACCESS LEFT  03/03/2021   IR US GUIDE VASC ACCESS RIGHT  04/14/2021   ORIF PATELLA Left 01/10/2019   Procedure: OPEN REDUCTION INTERNAL (ORIF) FIXATION LEFT PATELLA FRACTURE;  Surgeon: [name], MD;  Location: [facility];  Service: Orthopedics;  Laterality: Left;    No family history on file.  Social History:  reports that he has never smoked. He has never used smokeless tobacco. He reports that he does not drink alcohol and does not use drugs.  Allergies:  Allergies  Allergen Reactions   Metformin Hcl Other (See Comments)    Unknown reaction   Pravastatin Sodium Other (See Comments)    Muscle pain     Medications: I have reviewed the patient's current medications.  Results for orders placed or performed during the hospital encounter of 04/18/21 (from the past 48 hour(s))  Resp Panel by RT-PCR (Flu A&B, Covid) Nasopharyngeal Swab     Status: None   Collection Time: 04/18/21  4:35 PM   Specimen: Nasopharyngeal Swab; Nasopharyngeal(NP) swabs in vial transport medium  Result Value Ref Range   SARS Coronavirus 2 by RT PCR NEGATIVE NEGATIVE    Comment: (NOTE) SARS-CoV-2 target nucleic acids are NOT DETECTED.  The SARS-CoV-2 RNA is generally detectable in upper respiratory specimens during the acute phase of infection. The lowest concentration of SARS-CoV-2 viral copies this assay can detect is 138 copies/mL. A negative result does not preclude SARS-Cov-2 infection and should not be used as the sole basis  for treatment or other patient management decisions. A negative result may occur with  improper specimen collection/handling, submission of specimen other than nasopharyngeal swab, presence of viral mutation(s) within the areas targeted by this assay, and inadequate number of viral copies(<138 copies/mL). A negative result must be combined with clinical observations, patient history, and epidemiological information. The expected result is Negative.  Fact Sheet for Patients:  EntrepreneurPulse.com.au  Fact Sheet for Healthcare Providers:  IncredibleEmployment.be  This test is no t yet approved or cleared by  the [place] FDA and  has been authorized for detection and/or diagnosis of SARS-CoV-2 by FDA under an Emergency Use Authorization (EUA). This EUA will remain  in effect (meaning this test can be used) for the duration of the COVID-19 declaration under Section 564(b)(1) of the Act, 21 U.S.C.section 360bbb-3(b)(1), unless the authorization is terminated  or revoked sooner.       Influenza A by PCR NEGATIVE NEGATIVE   Influenza B by PCR NEGATIVE NEGATIVE    Comment: (NOTE) The Xpert Xpress SARS-CoV-2/FLU/RSV plus assay is intended as an aid in the diagnosis of influenza from Nasopharyngeal swab specimens and should not be used as a sole basis for treatment. Nasal washings and aspirates are unacceptable for Xpert Xpress SARS-CoV-2/FLU/RSV testing.  Fact Sheet for Patients: EntrepreneurPulse.com.au  Fact Sheet for Healthcare Providers: IncredibleEmployment.be  This test is not yet approved or cleared by the [place] FDA and has been authorized for detection and/or diagnosis of SARS-CoV-2 by FDA under an Emergency Use Authorization (EUA). This EUA will remain in effect (meaning this test can be used) for the duration of the COVID-19 declaration under Section 564(b)(1) of the Act, 21 U.S.C. section 360bbb-3(b)(1), unless the authorization is terminated or revoked.  Performed at [facility], [place] [address], [place], [place] [address]   Lactic acid, plasma     Status: None   Collection Time: 04/18/21  4:35 PM  Result Value Ref Range   Lactic Acid, Venous 1.3 0.5 - 1.9 mmol/L    Comment: Performed at [facility] [address], [place], [place] [address]  Comprehensive metabolic panel     Status: Abnormal   Collection Time: 04/18/21  4:35 PM  Result Value Ref Range   Sodium 125 (L) 135 - 145 mmol/L   Potassium 4.0 3.5 - 5.1 mmol/L   Chloride 91 (L) 98 - 111 mmol/L   CO2 19 (L) 22 - 32 mmol/L   Glucose, Bld 323 (H) 70 -  99 mg/dL    Comment: Glucose reference range applies only to samples taken after fasting for at least 8 hours.   BUN 32 (H) 8 - 23 mg/dL   Creatinine, Ser 1.65 (H) 0.61 - 1.24 mg/dL   Calcium 8.3 (L) 8.9 - 10.3 mg/dL   Total Protein 6.7 6.5 - 8.1 g/dL   Albumin 2.5 (L) 3.5 - 5.0 g/dL   AST 38 15 - 41 U/L   ALT 39 0 - 44 U/L   Alkaline Phosphatase 105 38 - 126 U/L   Total Bilirubin 1.0 0.3 - 1.2 mg/dL   GFR, Estimated 43 (L) >60 mL/min    Comment: (NOTE) Calculated using the CKD-EPI Creatinine Equation (2021)    Anion gap 15 5 - 15    Comment: Performed at [facility] [address] [place],  [address]  CBC WITH DIFFERENTIAL     Status: Abnormal   Collection Time: 04/18/21  4:35 PM  Result Value Ref Range  WBC 23.6 (H) 4.0 - 10.5 K/uL   RBC 4.98 4.22 - 5.81 MIL/uL   Hemoglobin 14.7 13.0 - 17.0 g/dL   HCT 41.1 39.0 - 52.0 %   MCV 82.5 80.0 - 100.0 fL   MCH 29.5 26.0 - 34.0 pg   MCHC 35.8 30.0 - 36.0 g/dL   RDW 11.9 11.5 - 15.5 %   Platelets 297 150 - 400 K/uL   nRBC 0.0 0.0 - 0.2 %   Neutrophils Relative % 92 %   Neutro Abs 21.9 (H) 1.7 - 7.7 K/uL   Lymphocytes Relative 2 %   Lymphs Abs 0.4 (L) 0.7 - 4.0 K/uL   Monocytes Relative 5 %   Monocytes Absolute 1.1 (H) 0.1 - 1.0 K/uL   Eosinophils Relative 0 %   Eosinophils Absolute 0.0 0.0 - 0.5 K/uL   Basophils Relative 0 %   Basophils Absolute 0.1 0.0 - 0.1 K/uL   Immature Granulocytes 1 %   Abs Immature Granulocytes 0.16 (H) 0.00 - 0.07 K/uL    Comment: Performed at [facility] [address], [place], [place] [address]  Protime-INR     Status: None   Collection Time: 04/18/21  4:35 PM  Result Value Ref Range   Prothrombin Time 14.2 11.4 - 15.2 seconds   INR 1.1 0.8 - 1.2    Comment: (NOTE) INR goal varies based on device and disease states. Performed at [facility], [place] [address], [place], [place] [address]   APTT     Status: None   Collection Time: 04/18/21  4:35 PM  Result Value  Ref Range   aPTT 34 24 - 36 seconds    Comment: Performed at [facility] [address] [place], [place] [address]  Urinalysis, Routine w reflex microscopic Urine, Clean Catch     Status: Abnormal   Collection Time: 04/18/21  4:35 PM  Result Value Ref Range   Color, Urine YELLOW YELLOW   APPearance CLOUDY (A) CLEAR   Specific Gravity, Urine 1.025 1.005 - 1.030   pH 5.5 5.0 - 8.0   Glucose, UA >=500 (A) NEGATIVE mg/dL   Hgb urine dipstick LARGE (A) NEGATIVE   Bilirubin Urine NEGATIVE NEGATIVE   Ketones, ur NEGATIVE NEGATIVE mg/dL   Protein, ur 100 (A) NEGATIVE mg/dL   Nitrite NEGATIVE NEGATIVE   Leukocytes,Ua SMALL (A) NEGATIVE    Comment: Performed at [facility] [address], [place], [place] [address]  Urinalysis, Microscopic (reflex)     Status: Abnormal   Collection Time: 04/18/21  4:35 PM  Result Value Ref Range   RBC / HPF >50 0 - 5 RBC/hpf   WBC, UA >50 0 - 5 WBC/hpf   Bacteria, UA MANY (A) NONE SEEN   Squamous Epithelial / LPF 0-5 0 - 5   WBC Clumps PRESENT    Mucus PRESENT     Comment: Performed at [facility], [place] [address] [place], [place] [address]  C-reactive protein     Status: Abnormal   Collection Time: 04/18/21  7:05 PM  Result Value Ref Range   CRP 22.8 (H) <1.0 mg/dL    Comment: Performed at [facility], [place] [address], [place], [place] [address]  Sedimentation rate     Status: Abnormal   Collection Time: 04/18/21  7:05 PM  Result Value Ref Range   Sed Rate 86 (H) 0 - 16 mm/hr    Comment: Performed at [facility], [address] [place], [place] [address]  Procalcitonin - Baseline     Status: None   Collection Time: 04/18/21  7:05 PM  Result Value Ref Range   Procalcitonin 0.83 ng/mL    Comment:        Interpretation: PCT > 0.5 ng/mL and <= 2 ng/mL: Systemic infection (sepsis) is possible, but other conditions are known to elevate PCT as well. (NOTE)       Sepsis PCT Algorithm           Lower Respiratory  Tract                                      Infection PCT Algorithm    ----------------------------     ----------------------------         PCT < 0.25 ng/mL                PCT < 0.10 ng/mL          Strongly encourage             Strongly discourage   discontinuation of antibiotics    initiation of antibiotics    ----------------------------     -----------------------------       PCT 0.25 - 0.50 ng/mL            PCT 0.10 - 0.25 ng/mL               OR       >80% decrease in PCT            Discourage initiation of                                            antibiotics      Encourage discontinuation           of antibiotics    ----------------------------     -----------------------------         PCT >= 0.50 ng/mL              PCT 0.26 - 0.50 ng/mL                AND       <80% decrease in PCT             Encourage initiation of                                             antibiotics       Encourage continuation           of antibiotics    ----------------------------     -----------------------------        PCT >= 0.50 ng/mL                  PCT > 0.50 ng/mL               AND         increase in PCT                  Strongly encourage  initiation of antibiotics    Strongly encourage escalation           of antibiotics                                     -----------------------------                                           PCT <= 0.25 ng/mL                                                 OR                                        > 80% decrease in PCT                                      Discontinue / Do not initiate                                             antibiotics  Performed at [facility] [address] [place], [place] [address]   CBG monitoring, ED     Status: Abnormal   Collection Time: 04/18/21  7:06 PM  Result Value Ref Range   Glucose-Capillary 325 (H) 70 - 99 mg/dL    Comment: Glucose reference range  applies only to samples taken after fasting for at least 8 hours.  TSH     Status: None   Collection Time: 04/18/21  8:09 PM  Result Value Ref Range   TSH 1.263 0.350 - 4.500 uIU/mL    Comment: Performed by a 3rd Generation assay with a functional sensitivity of <=0.01 uIU/mL. Performed at [facility], [place] [address] [place], [place] [address]   Strep pneumoniae urinary antigen     Status: None   Collection Time: 04/18/21  8:09 PM  Result Value Ref Range   Strep Pneumo Urinary Antigen NEGATIVE NEGATIVE    Comment:        Infection due to S. pneumoniae cannot be absolutely ruled out since the antigen present may be below the detection limit of the test. Performed at [facility], [place] [address], [place],  [address]   Respiratory (~20 pathogens) panel by PCR     Status: None   Collection Time: 04/18/21  8:09 PM   Specimen: Nasopharyngeal Swab; Respiratory  Result Value Ref Range   Adenovirus NOT DETECTED NOT DETECTED   Coronavirus 229E NOT DETECTED NOT DETECTED    Comment: (NOTE) The Coronavirus on the Respiratory Panel, DOES NOT test for the novel  Coronavirus (2019 nCoV)    Coronavirus HKU1 NOT DETECTED NOT DETECTED   Coronavirus NL63 NOT DETECTED NOT DETECTED   Coronavirus OC43 NOT DETECTED NOT DETECTED   Metapneumovirus NOT DETECTED NOT DETECTED   Rhinovirus / Enterovirus NOT DETECTED NOT DETECTED  Influenza A NOT DETECTED NOT DETECTED   Influenza B NOT DETECTED NOT DETECTED   Parainfluenza Virus 1 NOT DETECTED NOT DETECTED   Parainfluenza Virus 2 NOT DETECTED NOT DETECTED   Parainfluenza Virus 3 NOT DETECTED NOT DETECTED   Parainfluenza Virus 4 NOT DETECTED NOT DETECTED   Respiratory Syncytial Virus NOT DETECTED NOT DETECTED   Bordetella pertussis NOT DETECTED NOT DETECTED   Bordetella Parapertussis NOT DETECTED NOT DETECTED   Chlamydophila pneumoniae NOT DETECTED NOT DETECTED   Mycoplasma pneumoniae NOT DETECTED NOT DETECTED    Comment:  Performed at [facility], [place] [address] [place], [place] [address]  Sodium, urine, random     Status: None   Collection Time: 04/18/21  8:09 PM  Result Value Ref Range   Sodium, Ur 43 mmol/L    Comment: Performed at [facility] [address], [place], [place] [address]  CBG monitoring, ED     Status: Abnormal   Collection Time: 04/18/21  9:57 PM  Result Value Ref Range   Glucose-Capillary 373 (H) 70 - 99 mg/dL    Comment: Glucose reference range applies only to samples taken after fasting for at least 8 hours.   Comment 1 Notify RN    Comment 2 Document in Chart   CBG monitoring, ED     Status: Abnormal   Collection Time: 04/19/21  2:01 AM  Result Value Ref Range   Glucose-Capillary 404 (H) 70 - 99 mg/dL    Comment: Glucose reference range applies only to samples taken after fasting for at least 8 hours.  Basic metabolic panel     Status: Abnormal   Collection Time: 04/19/21  3:47 AM  Result Value Ref Range   Sodium 125 (L) 135 - 145 mmol/L   Potassium 4.3 3.5 - 5.1 mmol/L   Chloride 94 (L) 98 - 111 mmol/L   CO2 16 (L) 22 - 32 mmol/L   Glucose, Bld 400 (H) 70 - 99 mg/dL    Comment: Glucose reference range applies only to samples taken after fasting for at least 8 hours.   BUN 35 (H) 8 - 23 mg/dL   Creatinine, Ser 1.81 (H) 0.61 - 1.24 mg/dL   Calcium 7.9 (L) 8.9 - 10.3 mg/dL   GFR, Estimated 38 (L) >60 mL/min    Comment: (NOTE) Calculated using the CKD-EPI Creatinine Equation (2021)    Anion gap 15 5 - 15    Comment: Performed at [facility] [address], [place], [place] [address]  CBC     Status: Abnormal   Collection Time: 04/19/21  3:47 AM  Result Value Ref Range   WBC 25.3 (H) 4.0 - 10.5 K/uL   RBC 4.73 4.22 - 5.81 MIL/uL   Hemoglobin 13.9 13.0 - 17.0 g/dL   HCT 39.7 39.0 - 52.0 %   MCV 83.9 80.0 - 100.0 fL   MCH 29.4 26.0 - 34.0 pg   MCHC 35.0 30.0 - 36.0 g/dL   RDW 12.1 11.5 - 15.5 %   Platelets 260 150 - 400 K/uL   nRBC 0.0 0.0 - 0.2  %    Comment: Performed at [facility], [place] [address], [place], [place] [address]  CBG monitoring, ED     Status: Abnormal   Collection Time: 04/19/21  6:37 AM  Result Value Ref Range   Glucose-Capillary 450 (H) 70 - 99 mg/dL    Comment: Glucose reference range applies only to samples taken after fasting for  at least 8 hours.    DG CHEST PORT 1 VIEW  Result Date: 04/19/2021 CLINICAL DATA:  Dyspnea EXAM: PORTABLE CHEST 1 VIEW COMPARISON:  04/18/2021 FINDINGS: The heart size and mediastinal contours are within normal limits. Both lungs are clear. The visualized skeletal structures are unremarkable. IMPRESSION: No active disease. Electronically Signed   By: Ulyses Jarred M.D.   On: 04/19/2021 02:30   DG Chest Port 1 View  Result Date: 04/18/2021 CLINICAL DATA:  Questionable sepsis - evaluate for abnormality Weakness, fever, shortness of breath, cough. EXAM: PORTABLE CHEST 1 VIEW COMPARISON:  Radiograph 11/27/2011 FINDINGS: Lung volumes are low. Mild cardiomegaly, likely accentuated by technique. Normal mediastinal contours with aortic atherosclerosis. Mild patchy opacity at the left lung base. No large pleural effusion. No pneumothorax. No pulmonary edema. No acute osseous abnormalities are seen. IMPRESSION: 1. Patchy opacity at the left lung base, may represent atelectasis or pneumonia. 2. Mild cardiomegaly, likely accentuated by technique. Electronically Signed   By: Keith Rake M.D.   On: 04/18/2021 17:17   DG Foot Complete Left  Result Date: 04/18/2021 CLINICAL DATA:  Wound. EXAM: LEFT FOOT - COMPLETE 3+ VIEW COMPARISON:  No prior foot exams. FINDINGS: Prior resection of the first and second toes. There is a fracture of the third toe proximal phalanx of unknown acuity. Soft tissue defect subjacent to the metatarsal phalangeal joint joints, seen on the lateral view, tentatively visualized overlying the 1st-2nd intertarsal space. Subcortical cystic change about the distal metatarsal head  but no frank erosions. Chronic degenerative change of the midfoot. Large plantar calcaneal spur and Achilles tendon enthesophyte. IMPRESSION: 1. Soft tissue defect subjacent to the metatarsophalangeal joints, tentatively visualized overlying the 1st-2nd intertarsal space, likely representing site of wound. 2. Prior resection of the first and second toes. Subcortical cystic change of the first metatarsal head is of unknown acuity, favor chronic. No definite radiographic findings of osteomyelitis. 3. Fracture of the third toe proximal phalanx of unknown acuity. Electronically Signed   By: Keith Rake M.D.   On: 04/18/2021 17:24   DG Toe Great Right  Result Date: 04/18/2021 CLINICAL DATA:  Wound. EXAM: RIGHT GREAT TOE COMPARISON:  Foot radiograph 09/14/2019 FINDINGS: Decreased density the medial aspect of the great toe distal phalanx, with overlying thinning of the soft tissues, level pathologic fracture. There are small erosions about the medial aspect of the first metatarsal phalangeal joint. Osteoarthritis of the first metatarsal phalangeal joint. Diffuse soft tissue edema about the proximal digit. Prior resection of the second toe. No radiopaque foreign body. IMPRESSION: 1. 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 2. Small erosions about the medial aspect first metatarsophalangeal joint. 3. Diffuse soft tissue edema about the proximal digit. Electronically Signed   By: Keith Rake M.D.   On: 04/18/2021 17:20    Review of Systems Blood pressure 123/78, pulse (!) 104, temperature (!) 102.2 F (39 C), temperature source Rectal, resp. rate (!) 30, SpO2 97 %. Physical Exam General: NAD-currently states that his pain is controlled and denies any fevers or chills.  Dermatological: Ulceration noted to the right medial hallux with an eschar noted.  There is no surrounding edema or erythema.  There is no drainage or pus.  No fluctuation or  crepitation.  On the left side gangrenous changes present distal aspect of the fourth toe which appears to be dry.  There is no drainage or pus.       Vascular: Decreased pulses bilaterally  Neruologic: Sensation  decreased  Musculoskeletal: Prior digital amputations left foot.  Gait: Unassisted, Nonantalgic.   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  I discussed with the patient likely need for amputation of the right hallux.  Patient understands this await radiology report.  We will keep him n.p.o. in case of surgery later on today versus surgery tomorrow.  Ordered updated arterial studies.  Continue IV antibiotics.  Podiatry will continue to follow. Recommend IR vs vascular consult.   I have updated [name] who is on-call for our practice today.  Trula Slade 04/19/2021, 7:29 AM

## 2021-04-19 NOTE — Progress Notes (Signed)
Went to see patient to discuss tomorrow's surgery. Reviewed MRI, imaging studies, and history including patient's vascular interventions.  03/03/21: SFA stenosis -> DAART right SFA without residual stenosis 04/14/21: Patent R CFA Patent fem-pop system In-line flow to the ankle via the peroneal artery. AT is occluded in the first third PT was occluded proximally on initial, In-line flow into the pedal arteries/forefoot on conclusion.   Given these interventions reasonable to proceed with right great toe amputation, especially for source control of sepsis. We discussed that given PAD this may be slow to heal.   Discussed procedure with patient. Advised NPO after midnight for procedure in AM. Given opportunity to ask questions.  Will proceed with procedure in AM and follow post-operatively.  [name], [name]

## 2021-04-19 NOTE — Progress Notes (Signed)
PHARMACY - PHYSICIAN COMMUNICATION CRITICAL VALUE ALERT - BLOOD CULTURE IDENTIFICATION (BCID)  Zachary [name] is an 76 y.o. male who presented to [facility] on 04/18/2021 with leg pain and right foot wound  Assessment:  4/4 bottles with MSSA, likely source is foot    Name of physician (or Provider) Contacted: Irene Pap, DO and infectious disease team   Current antibiotics: vancomycin 750 mg IV q24h ceftriaxone 2g IV q24h, azithromycin 500 mg IV q24h  Changes to prescribed antibiotics recommended:  Discontinue vancomycin, ceftriaxone, azithromycin Start cefazolin 2g IV Q8h  Recommendations accepted by provider  Results for orders placed or performed during the hospital encounter of 04/18/21  Blood Culture ID Panel (Reflexed) (Collected: 04/18/2021  5:10 PM)  Result Value Ref Range   Enterococcus faecalis NOT DETECTED NOT DETECTED   Enterococcus Faecium NOT DETECTED NOT DETECTED   Listeria monocytogenes NOT DETECTED NOT DETECTED   Staphylococcus species DETECTED (A) NOT DETECTED   Staphylococcus aureus (BCID) DETECTED (A) NOT DETECTED   Staphylococcus epidermidis NOT DETECTED NOT DETECTED   Staphylococcus lugdunensis NOT DETECTED NOT DETECTED   Streptococcus species NOT DETECTED NOT DETECTED   Streptococcus agalactiae NOT DETECTED NOT DETECTED   Streptococcus pneumoniae NOT DETECTED NOT DETECTED   Streptococcus pyogenes NOT DETECTED NOT DETECTED   A.calcoaceticus-baumannii NOT DETECTED NOT DETECTED   Bacteroides fragilis NOT DETECTED NOT DETECTED   Enterobacterales NOT DETECTED NOT DETECTED   Enterobacter cloacae complex NOT DETECTED NOT DETECTED   Escherichia coli NOT DETECTED NOT DETECTED   Klebsiella aerogenes NOT DETECTED NOT DETECTED   Klebsiella oxytoca NOT DETECTED NOT DETECTED   Klebsiella pneumoniae NOT DETECTED NOT DETECTED   Proteus species NOT DETECTED NOT DETECTED   Salmonella species NOT DETECTED NOT DETECTED   Serratia marcescens NOT DETECTED NOT DETECTED    Haemophilus influenzae NOT DETECTED NOT DETECTED   Neisseria meningitidis NOT DETECTED NOT DETECTED   Pseudomonas aeruginosa NOT DETECTED NOT DETECTED   Stenotrophomonas maltophilia NOT DETECTED NOT DETECTED   Candida albicans NOT DETECTED NOT DETECTED   Candida auris NOT DETECTED NOT DETECTED   Candida glabrata NOT DETECTED NOT DETECTED   Candida krusei NOT DETECTED NOT DETECTED   Candida parapsilosis NOT DETECTED NOT DETECTED   Candida tropicalis NOT DETECTED NOT DETECTED   Cryptococcus neoformans/gattii NOT DETECTED NOT DETECTED   Meth resistant mecA/C and MREJ NOT DETECTED NOT DETECTED    Elsie Amis, PharmD Student  04/19/2021  10:43 AM

## 2021-04-19 NOTE — Telephone Encounter (Signed)
"  I need additional information about the patient, smoking status, edema control or compressive therapy, current albumin or pre-albumin levels, and finally, a current A1c level.  My direct fax number is [phone]."

## 2021-04-19 NOTE — Progress Notes (Signed)
PROGRESS NOTE  Zachary [name] XKG:[id number] DOB: 1945/07/15 DOA: 04/18/2021 PCP: [name], FNP  HPI/Recap of past 24 hours: Zachary [name] is a 76 y.o. male with medical history significant of T2DM, HLD, HTN, PVD, BPH who presented to [facility] ED with complaints of bilateral leg pain. He had  aorto-peripheral angiogram on 04/14/21 by IR. After the procedure he reportedly started to have pain in his legs.  Described as stabbing. Pain is constant and tylenol does help. He denies any bleeding or drainage from procedure site. He also has had right lower back pain for the past few days as well.  Work-up revealed osteomyelitis of both proximal and distal phalanx of the great toe seen on MRI 04/18/2021.   Seen by podiatry plan for surgery tomorrow.  04/19/2021: Patient was seen and examined at his bedside in the ED.  At the time of discharge he denies having any pain.     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 osteomyelitis of rigth great toe    Hyponatremia   Osteomyelitis (HCC)  Sepsis secondary to osteomyelitis of rigth great toe - (present on admission) 77 year old male presenting with worsening bilateral leg pain and cough with known gangrenous right toe found to be septic with white count of 23 K, tachycardia and tachypnea source osteomyelitis. Initially given vanc/cefepime and flagyl>changed to IV vanc/rocephin and azithromycin to cover for osteo -urinary antigen -sputum cx, cough is dry though -duonebs prn -gentle IVF  -robitussin prn -trend cbc  Continue to follow blood cultures x2 peripherally.   Osteomyelitis of great toe of right foot ([place])- (present on admission) -podiatry consulted, Dr. [name] -checking  inflammatory markers Continue IV vancomycin and rocephin Seen by podiatry with plan for amputation tomorrow   Anion gap metabolic acidosis likely secondary to renal insufficiency. Anion gap 15, serum bicarb 16 Continue gentle IV fluid hydration if no improvement may consider p.o. 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 Typically on 50 units, will start at 20 units as he has had poor PO intake and start him on SSI Glucose high on admit 351>323. No AG, no ketones.  Routine accuchecks per protocol    Peripheral vascular disease ([name])- (present on admission) Recent angiogram to right leg on 04/14/21 by IR, no signs of infection at incision site Has had progressive pain in both legs, Dr. Rogers Blocker called IR, Dr. Annamaria Boots, and discussed and if no infection to incision site not contributing to sepsis.  Holding plavix/ASA as he may need surgery on his toe Refuses statin   Hyponatremia- (present on admission) Correction to 130 with hyperglycemia  Likely secondary to hypovolemia hyponatremia Continue gentle hydration and check urine sodium    Essential hypertension- (present on admission) Slightly above goal. Continue home oral antihypertensive Continue to closely monitor vital signs IV antihypertensive as needed with parameters.   Hyperlipidemia- (present on admission) Patient refuses to take statin   Stage 3 chronic kidney disease ([place])- (present on admission) Baseline ? (1.4-1.8) At baseline today, continue to monitor Watch I/O  DVT prophylaxis:  SCDs Code Status:  DNR - confirmed with patient Family Communication: None at bedside.  Disposition Plan:  The patient is from: home             Anticipated d/c is to: wen podiatry signs off.                  Patient is currently:  Pending surgery. Consults called: podiatry: Dr. [name] Admission status: Inpatient.  Inpatient status.  Patient requires at least 2 midnights for further evaluation and treatment of present condition.      Status is: Inpatient        Objective: Vitals:   04/19/21 0400 04/19/21 0600 04/19/21 0747 04/19/21 0800  BP: 116/66 123/78  132/83  Pulse: (!) 107 (!) 104  99  Resp: (!) 31 (!) 30  (!) 22  Temp:   98.6 F (37 C)   TempSrc:   Oral   SpO2: 97% 97%  100%    Intake/Output Summary (Last 24 hours) at 04/19/2021 1102 Last data filed at 04/19/2021 1045 Gross per 24 hour  Intake 3652.53 ml  Output --  Net 3652.53 ml   There were no vitals filed for this visit.  Exam:  General: 76 y.o. year-old male well developed well nourished in no acute distress.  Alert and oriented x3. Cardiovascular: Regular rate and rhythm with no rubs or gallops.  No thyromegaly or JVD noted.   Respiratory: Clear to auscultation with no wheezes or rales. Good inspiratory effort. Abdomen: Soft nontender nondistended with normal bowel sounds x4 quadrants. Musculoskeletal: No lower extremity edema.  Poor dorsalis pedis pulses.   Skin: Ulcerative lesion affecting right great toe. Psychiatry: Mood is appropriate for condition and setting   Data Reviewed: CBC: Recent Labs  Lab 04/14/21 0724 04/18/21 1635 04/19/21 0347  WBC 14.3* 23.6* 25.3*  NEUTROABS  --  21.9*  --   HGB 15.7 14.7 13.9  HCT 45.9 41.1 39.7  MCV 85.3 82.5 83.9  PLT 217 297 607   Basic Metabolic Panel: Recent Labs  Lab 04/14/21 0724 04/18/21 1635 04/19/21 0347  NA 129* 125* 125*  K 4.6 4.0 4.3  CL 95* 91* 94*  CO2 22 19* 16*  GLUCOSE 240* 323* 400*  BUN 21 32* 35*  CREATININE 1.93* 1.65* 1.81*  CALCIUM 8.8* 8.3* 7.9*   GFR: Estimated Creatinine Clearance: 37.4 mL/min (A) (by C-G formula based on SCr of 1.81 mg/dL (H)). Liver Function Tests: Recent Labs  Lab 04/18/21 1635  AST 38  ALT 39  ALKPHOS 105   BILITOT 1.0  PROT 6.7  ALBUMIN 2.5*   No results for input(s): LIPASE, AMYLASE in the last 168 hours. No results for input(s): AMMONIA in the last 168 hours. Coagulation Profile: Recent Labs  Lab 04/14/21 0724 04/18/21 1635  INR 1.0 1.1   Cardiac Enzymes: No results for input(s): CKTOTAL, CKMB, CKMBINDEX, TROPONINI in the last 168 hours. BNP (last 3 results) No results for input(s): PROBNP in the last 8760 hours. HbA1C: No results for input(s): HGBA1C in the last 72 hours. CBG: Recent Labs  Lab 04/18/21 1906 04/18/21 2157 04/19/21 0201 04/19/21 0637 04/19/21 0930  GLUCAP 325* 373* 404* 450* 395*   Lipid Profile: No results for input(s): CHOL, HDL, LDLCALC, TRIG, CHOLHDL, LDLDIRECT in the last 72 hours. Thyroid Function Tests: Recent Labs    04/18/21 2009  TSH 1.263   Anemia Panel: No results for input(s): VITAMINB12, FOLATE, FERRITIN, TIBC, IRON, RETICCTPCT in the last 72 hours. Urine analysis:  Component Value Date/Time   COLORURINE YELLOW 04/18/2021 1635   APPEARANCEUR CLOUDY (A) 04/18/2021 1635   LABSPEC 1.025 04/18/2021 1635   PHURINE 5.5 04/18/2021 1635   GLUCOSEU >=500 (A) 04/18/2021 1635   HGBUR LARGE (A) 04/18/2021 1635   BILIRUBINUR NEGATIVE 04/18/2021 1635   KETONESUR NEGATIVE 04/18/2021 1635   PROTEINUR 100 (A) 04/18/2021 1635   NITRITE NEGATIVE 04/18/2021 1635   LEUKOCYTESUR SMALL (A) 04/18/2021 1635   Sepsis Labs: @LABRCNTIP (procalcitonin:4,lacticidven:4)  ) Recent Results (from the past 240 hour(s))  Resp Panel by RT-PCR (Flu A&B, Covid) Nasopharyngeal Swab     Status: None   Collection Time: 04/18/21  4:35 PM   Specimen: Nasopharyngeal Swab; Nasopharyngeal(NP) swabs in vial transport medium  Result Value Ref Range Status   SARS Coronavirus 2 by RT PCR NEGATIVE NEGATIVE Final    Comment: (NOTE) SARS-CoV-2 target nucleic acids are NOT DETECTED.  The SARS-CoV-2 RNA is generally detectable in upper respiratory specimens during the  acute phase of infection. The lowest concentration of SARS-CoV-2 viral copies this assay can detect is 138 copies/mL. A negative result does not preclude SARS-Cov-2 infection and should not be used as the sole basis for treatment or other patient management decisions. A negative result may occur with  improper specimen collection/handling, submission of specimen other than nasopharyngeal swab, presence of viral mutation(s) within the areas targeted by this assay, and inadequate number of viral copies(<138 copies/mL). A negative result must be combined with clinical observations, patient history, and epidemiological information. The expected result is Negative.  Fact Sheet for Patients:  EntrepreneurPulse.com.au  Fact Sheet for Healthcare Providers:  IncredibleEmployment.be  This test is no t yet approved or cleared by the [place] FDA and  has been authorized for detection and/or diagnosis of SARS-CoV-2 by FDA under an Emergency Use Authorization (EUA). This EUA will remain  in effect (meaning this test can be used) for the duration of the COVID-19 declaration under Section 564(b)(1) of the Act, 21 U.S.C.section 360bbb-3(b)(1), unless the authorization is terminated  or revoked sooner.       Influenza A by PCR NEGATIVE NEGATIVE Final   Influenza B by PCR NEGATIVE NEGATIVE Final    Comment: (NOTE) The Xpert Xpress SARS-CoV-2/FLU/RSV plus assay is intended as an aid in the diagnosis of influenza from Nasopharyngeal swab specimens and should not be used as a sole basis for treatment. Nasal washings and aspirates are unacceptable for Xpert Xpress SARS-CoV-2/FLU/RSV testing.  Fact Sheet for Patients: EntrepreneurPulse.com.au  Fact Sheet for Healthcare Providers: IncredibleEmployment.be  This test is not yet approved or cleared by the [place] FDA and has been authorized for detection and/or  diagnosis of SARS-CoV-2 by FDA under an Emergency Use Authorization (EUA). This EUA will remain in effect (meaning this test can be used) for the duration of the COVID-19 declaration under Section 564(b)(1) of the Act, 21 U.S.C. section 360bbb-3(b)(1), unless the authorization is terminated or revoked.  Performed at [facility], [place] [address] [place], [place] [address]   Blood Culture (routine x 2)     Status: None (Preliminary result)   Collection Time: 04/18/21  4:40 PM   Specimen: BLOOD RIGHT FOREARM  Result Value Ref Range Status   Specimen Description BLOOD RIGHT FOREARM  Final   Special Requests   Final    BOTTLES DRAWN AEROBIC AND ANAEROBIC Blood Culture results may not be optimal due to an excessive volume of blood received in culture bottles   Culture  Setup Time   Final  GRAM POSITIVE COCCI IN CLUSTERS IN BOTH AEROBIC AND ANAEROBIC BOTTLES CRITICAL VALUE NOTED.  VALUE IS CONSISTENT WITH PREVIOUSLY REPORTED AND CALLED VALUE. Performed at [facility], [place] [address] [place], [place] [address]    Culture GRAM POSITIVE COCCI  Final   Report Status PENDING  Incomplete  Blood Culture (routine x 2)     Status: None (Preliminary result)   Collection Time: 04/18/21  5:10 PM   Specimen: BLOOD RIGHT HAND  Result Value Ref Range Status   Specimen Description BLOOD RIGHT HAND  Final   Special Requests   Final    BOTTLES DRAWN AEROBIC AND ANAEROBIC Blood Culture results may not be optimal due to an inadequate volume of blood received in culture bottles   Culture  Setup Time   Final    GRAM POSITIVE COCCI IN CLUSTERS IN BOTH AEROBIC AND ANAEROBIC BOTTLES CRITICAL RESULT CALLED TO, READ BACK BY AND VERIFIED WITH: Carla Drape [id number] AT 39 BY CM Performed at [facility], [place] [address], [place], [place] [address]    Culture GRAM POSITIVE COCCI  Final   Report Status PENDING  Incomplete  Blood Culture ID Panel (Reflexed)     Status: Abnormal    Collection Time: 04/18/21  5:10 PM  Result Value Ref Range Status   Enterococcus faecalis NOT DETECTED NOT DETECTED Final   Enterococcus Faecium NOT DETECTED NOT DETECTED Final   Listeria monocytogenes NOT DETECTED NOT DETECTED Final   Staphylococcus species DETECTED (A) NOT DETECTED Final    Comment: CRITICAL RESULT CALLED TO, READ BACK BY AND VERIFIED WITH: PHARMD C JACKSON [id number] AT 1037 BY CM    Staphylococcus aureus (BCID) DETECTED (A) NOT DETECTED Final    Comment: CRITICAL RESULT CALLED TO, READ BACK BY AND VERIFIED WITH: PHARMD C JACKSON [id number] AT 1037 BY CM    Staphylococcus epidermidis NOT DETECTED NOT DETECTED Final   Staphylococcus lugdunensis NOT DETECTED NOT DETECTED Final   Streptococcus species NOT DETECTED NOT DETECTED Final   Streptococcus agalactiae NOT DETECTED NOT DETECTED Final   Streptococcus pneumoniae NOT DETECTED NOT DETECTED Final   Streptococcus pyogenes NOT DETECTED NOT DETECTED Final   A.calcoaceticus-baumannii NOT DETECTED NOT DETECTED Final   Bacteroides fragilis NOT DETECTED NOT DETECTED Final   Enterobacterales NOT DETECTED NOT DETECTED Final   Enterobacter cloacae complex NOT DETECTED NOT DETECTED Final   Escherichia coli NOT DETECTED NOT DETECTED Final   Klebsiella aerogenes NOT DETECTED NOT DETECTED Final   Klebsiella oxytoca NOT DETECTED NOT DETECTED Final   Klebsiella pneumoniae NOT DETECTED NOT DETECTED Final   Proteus species NOT DETECTED NOT DETECTED Final   Salmonella species NOT DETECTED NOT DETECTED Final   Serratia marcescens NOT DETECTED NOT DETECTED Final   Haemophilus influenzae NOT DETECTED NOT DETECTED Final   Neisseria meningitidis NOT DETECTED NOT DETECTED Final   Pseudomonas aeruginosa NOT DETECTED NOT DETECTED Final   Stenotrophomonas maltophilia NOT DETECTED NOT DETECTED Final   Candida albicans NOT DETECTED NOT DETECTED Final   Candida auris NOT DETECTED NOT DETECTED Final   Candida glabrata NOT DETECTED NOT DETECTED Final    Candida krusei NOT DETECTED NOT DETECTED Final   Candida parapsilosis NOT DETECTED NOT DETECTED Final   Candida tropicalis NOT DETECTED NOT DETECTED Final   Cryptococcus neoformans/gattii NOT DETECTED NOT DETECTED Final   Meth resistant mecA/C and MREJ NOT DETECTED NOT DETECTED Final    Comment: Performed at [facility], [address], [place], [place] [address]  Respiratory (~20 pathogens) panel by PCR     Status: None   Collection Time: 04/18/21  8:09 PM   Specimen: Nasopharyngeal Swab; Respiratory  Result Value Ref Range Status   Adenovirus NOT DETECTED NOT DETECTED Final   Coronavirus 229E NOT DETECTED NOT DETECTED Final    Comment: (NOTE) The Coronavirus on the Respiratory Panel, DOES NOT test for the novel  Coronavirus (2019 nCoV)    Coronavirus HKU1 NOT DETECTED NOT DETECTED Final   Coronavirus NL63 NOT DETECTED NOT DETECTED Final   Coronavirus OC43 NOT DETECTED NOT DETECTED Final   Metapneumovirus NOT DETECTED NOT DETECTED Final   Rhinovirus / Enterovirus NOT DETECTED NOT DETECTED Final   Influenza A NOT DETECTED NOT DETECTED Final   Influenza B NOT DETECTED NOT DETECTED Final   Parainfluenza Virus 1 NOT DETECTED NOT DETECTED Final   Parainfluenza Virus 2 NOT DETECTED NOT DETECTED Final   Parainfluenza Virus 3 NOT DETECTED NOT DETECTED Final   Parainfluenza Virus 4 NOT DETECTED NOT DETECTED Final   Respiratory Syncytial Virus NOT DETECTED NOT DETECTED Final   Bordetella pertussis NOT DETECTED NOT DETECTED Final   Bordetella Parapertussis NOT DETECTED NOT DETECTED Final   Chlamydophila pneumoniae NOT DETECTED NOT DETECTED Final   Mycoplasma pneumoniae NOT DETECTED NOT DETECTED Final    Comment: Performed at [facility], [place]. [address] [place], [place] [address]      Studies: MR FOOT RIGHT WO CONTRAST  Result Date: 04/19/2021 CLINICAL DATA:  76 year old male with type 2 diabetes and peripheral vascular disease. Aorto-peripheral angiogram by  Interventional Radiology 04/14/2021 with subsequent pain in legs. Evaluate for osteomyelitis. EXAM: MRI OF THE RIGHT FOREFOOT WITHOUT CONTRAST TECHNIQUE: Multiplanar, multisequence MR imaging of the right forefoot was performed. No intravenous contrast was administered. COMPARISON:  Right toes radiographs 04/18/2021 FINDINGS: Bones/Joint/Cartilage Patient motion artifact mildly to moderately technical degrades this examination. Within this limitation, there is diffuse decreased T1 and increased T2 signal throughout the distal phalanx of the great toe and the distal greater than proximal aspect of the proximal phalanx of the great toe. There is thinning and ulceration of the medial great toe soft tissues, greatest at the interphalangeal joint and distal phalanx. There is diffuse but mild erosion of the medial cortex of the distal phalanx and the distal medial cortex of the proximal phalanx. Findings are highly concerning for acute osteomyelitis. Moderate degenerative changes of the great toe metatarsophalangeal joint. 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 Ligaments The visualized flexor and extensor tendons are intact. Muscles and Tendons Mild-to-moderate nonspecific myositis of the plantar and intrinsic musculature. Soft tissues No walled-off abscess is seen. IMPRESSION: Findings concerning for osteomyelitis of the distal phalanx greater than the proximal phalanx of the great toe, as described above. Electronically Signed   By: Yvonne Kendall   On: 04/19/2021 08:13   DG CHEST PORT 1 VIEW  Result Date: 04/19/2021 CLINICAL DATA:  Dyspnea EXAM: PORTABLE CHEST 1 VIEW COMPARISON:  04/18/2021 FINDINGS: The heart size and mediastinal contours are within normal limits. Both lungs are clear. The visualized skeletal structures are unremarkable. IMPRESSION: No active disease. Electronically Signed   By: Ulyses Jarred M.D.   On: 04/19/2021 02:30   DG  Chest Port 1 View  Result Date: 04/18/2021 CLINICAL DATA:  Questionable sepsis - evaluate for abnormality Weakness, fever, shortness of breath, cough. EXAM: PORTABLE CHEST 1 VIEW COMPARISON:  Radiograph 11/27/2011 FINDINGS: Lung volumes are low. Mild cardiomegaly, likely accentuated by technique. Normal  mediastinal contours with aortic atherosclerosis. Mild patchy opacity at the left lung base. No large pleural effusion. No pneumothorax. No pulmonary edema. No acute osseous abnormalities are seen. IMPRESSION: 1. Patchy opacity at the left lung base, may represent atelectasis or pneumonia. 2. Mild cardiomegaly, likely accentuated by technique. Electronically Signed   By: Keith Rake M.D.   On: 04/18/2021 17:17   DG Foot Complete Left  Result Date: 04/18/2021 CLINICAL DATA:  Wound. EXAM: LEFT FOOT - COMPLETE 3+ VIEW COMPARISON:  No prior foot exams. FINDINGS: Prior resection of the first and second toes. There is a fracture of the third toe proximal phalanx of unknown acuity. Soft tissue defect subjacent to the metatarsal phalangeal joint joints, seen on the lateral view, tentatively visualized overlying the 1st-2nd intertarsal space. Subcortical cystic change about the distal metatarsal head but no frank erosions. Chronic degenerative change of the midfoot. Large plantar calcaneal spur and Achilles tendon enthesophyte. IMPRESSION: 1. Soft tissue defect subjacent to the metatarsophalangeal joints, tentatively visualized overlying the 1st-2nd intertarsal space, likely representing site of wound. 2. Prior resection of the first and second toes. Subcortical cystic change of the first metatarsal head is of unknown acuity, favor chronic. No definite radiographic findings of osteomyelitis. 3. Fracture of the third toe proximal phalanx of unknown acuity. Electronically Signed   By: Keith Rake M.D.   On: 04/18/2021 17:24   DG Toe Great Right  Result Date: 04/18/2021 CLINICAL DATA:  Wound. EXAM: RIGHT GREAT  TOE COMPARISON:  Foot radiograph 09/14/2019 FINDINGS: Decreased density the medial aspect of the great toe distal phalanx, with overlying thinning of the soft tissues, level pathologic fracture. There are small erosions about the medial aspect of the first metatarsal phalangeal joint. Osteoarthritis of the first metatarsal phalangeal joint. Diffuse soft tissue edema about the proximal digit. Prior resection of the second toe. No radiopaque foreign body. IMPRESSION: 1. 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 2. Small erosions about the medial aspect first metatarsophalangeal joint. 3. Diffuse soft tissue edema about the proximal digit. Electronically Signed   By: Keith Rake M.D.   On: 04/18/2021 17:20    Scheduled Meds:  diltiazem  120 mg Oral Daily   insulin aspart  0-9 Units Subcutaneous TID WC   insulin glargine-yfgn  20 Units Subcutaneous Daily    Continuous Infusions:   ceFAZolin (ANCEF) IV     lactated ringers 100 mL/hr at 04/19/21 0941     LOS: 0 days     Kayleen Memos, MD [facility] Pager [phone]  If 7PM-7AM, please contact night-coverage [email or web address] Password [email or web address] 04/19/2021, 11:02 AM

## 2021-04-19 NOTE — Consult Note (Signed)
WOC Nurse Consult Note: Reason for Consult: Right great toe and left foot, 4th digit with necrosis, right great toe with exudate per EDP note.  Podiatry has been simultaneously consulted, will be seeing today. Patient sees Dr. Posey [place] in the community; last seen by that provider on 03/30/21, Wound type: neuropathic vs arterial insufficiency, infectious Pressure Injury POA:N/A  Currently there is no role for [facility] [place] in the [place] other than to provide Nursing with conservative care guidance.  I will provide guidance for cleansing and dressing with dry gauze until podiatric medicine sees.  [place] nursing team will not follow, but will remain available to this patient, the nursing and medical teams.  Please re-consult if needed. Thanks, Maudie Flakes, MSN, RN, [place], Arther Abbott  Pager# [phone]

## 2021-04-19 NOTE — H&P (View-Only) (Signed)
Reason for Consult:Ulcer, Infection Referring Physician: Dr. Orma Flaming, MD  Zachary [name] is an 76 y.o. male.  HPI:  76 y.o. male with medical history significant of T2DM, HLD, HTN, PVD, BPH who presented to ED with complaints of bilateral leg pain. He had  aorto-peripheral angiogram on 04/14/21 by IR. After the procedure he started to have pain in his legs.  He has wounds to both of his feet.  He is going down to our practice and was seen by Dr. Posey Pronto on April 09, 2021 and found to have worsened at that time.  He underwent angio on 06/04/2021 with Dr. Damita Dunnings, DO.  Podiatry was consulted for the wounds on the feet, sepsis.  Past Medical History:  Diagnosis Date   Diabetes mellitus    Hypercholesteremia    Hypertension    Patella fracture    left   PVD (peripheral vascular disease) ([place])     Past Surgical History:  Procedure Laterality Date   AMPUTATION     AMPUTATION  11/28/2011   Procedure: AMPUTATION RAY;  Surgeon: [name], MD;  Location: [facility];  Service: Orthopedics;  Laterality: Left;  left 2nd toe amputation   AMPUTATION TOE Right 09/14/2019   Procedure: AMPUTATION TOE RIGHT SECOND TOE;  Surgeon: [name], [name];  Location: [facility];  Service: Podiatry;  Laterality: Right;   IR ANGIOGRAM EXTREMITY BILATERAL  03/03/2021   IR ANGIOGRAM EXTREMITY RIGHT  03/03/2021   IR ANGIOGRAM EXTREMITY RIGHT  04/14/2021   IR FEM POP ART PTA MOD SED  03/03/2021   IR INTRAVASCULAR ULTRASOUND NON CORONARY  03/03/2021   IR RADIOLOGIST EVAL & MGMT  09/27/2019   IR RADIOLOGIST EVAL & MGMT  12/18/2019   IR RADIOLOGIST EVAL & MGMT  02/11/2021   IR RADIOLOGIST EVAL & MGMT  04/09/2021   IR TIB-PERO ART ATHEREC INC PTA MOD SED  03/03/2021   IR TIB-PERO ART PTA MOD SED  04/14/2021   IR US GUIDE VASC ACCESS LEFT  03/03/2021   IR US GUIDE VASC ACCESS RIGHT  04/14/2021   ORIF PATELLA Left 01/10/2019   Procedure: OPEN REDUCTION INTERNAL (ORIF) FIXATION LEFT PATELLA FRACTURE;  Surgeon: [name], MD;  Location: [facility];  Service: Orthopedics;  Laterality: Left;    No family history on file.  Social History:  reports that he has never smoked. He has never used smokeless tobacco. He reports that he does not drink alcohol and does not use drugs.  Allergies:  Allergies  Allergen Reactions   Metformin Hcl Other (See Comments)    Unknown reaction   Pravastatin Sodium Other (See Comments)    Muscle pain     Medications: I have reviewed the patient's current medications.  Results for orders placed or performed during the hospital encounter of 04/18/21 (from the past 48 hour(s))  Resp Panel by RT-PCR (Flu A&B, Covid) Nasopharyngeal Swab     Status: None   Collection Time: 04/18/21  4:35 PM   Specimen: Nasopharyngeal Swab; Nasopharyngeal(NP) swabs in vial transport medium  Result Value Ref Range   SARS Coronavirus 2 by RT PCR NEGATIVE NEGATIVE    Comment: (NOTE) SARS-CoV-2 target nucleic acids are NOT DETECTED.  The SARS-CoV-2 RNA is generally detectable in upper respiratory specimens during the acute phase of infection. The lowest concentration of SARS-CoV-2 viral copies this assay can detect is 138 copies/mL. A negative result does not preclude SARS-Cov-2 infection and should not be used as the sole basis  for treatment or other patient management decisions. A negative result may occur with  improper specimen collection/handling, submission of specimen other than nasopharyngeal swab, presence of viral mutation(s) within the areas targeted by this assay, and inadequate number of viral copies(<138 copies/mL). A negative result must be combined with clinical observations, patient history, and epidemiological information. The expected result is Negative.  Fact Sheet for Patients:  EntrepreneurPulse.com.au  Fact Sheet for Healthcare Providers:  IncredibleEmployment.be  This test is no t yet approved or cleared by  the [place] FDA and  has been authorized for detection and/or diagnosis of SARS-CoV-2 by FDA under an Emergency Use Authorization (EUA). This EUA will remain  in effect (meaning this test can be used) for the duration of the COVID-19 declaration under Section 564(b)(1) of the Act, 21 U.S.C.section 360bbb-3(b)(1), unless the authorization is terminated  or revoked sooner.       Influenza A by PCR NEGATIVE NEGATIVE   Influenza B by PCR NEGATIVE NEGATIVE    Comment: (NOTE) The Xpert Xpress SARS-CoV-2/FLU/RSV plus assay is intended as an aid in the diagnosis of influenza from Nasopharyngeal swab specimens and should not be used as a sole basis for treatment. Nasal washings and aspirates are unacceptable for Xpert Xpress SARS-CoV-2/FLU/RSV testing.  Fact Sheet for Patients: EntrepreneurPulse.com.au  Fact Sheet for Healthcare Providers: IncredibleEmployment.be  This test is not yet approved or cleared by the [place] FDA and has been authorized for detection and/or diagnosis of SARS-CoV-2 by FDA under an Emergency Use Authorization (EUA). This EUA will remain in effect (meaning this test can be used) for the duration of the COVID-19 declaration under Section 564(b)(1) of the Act, 21 U.S.C. section 360bbb-3(b)(1), unless the authorization is terminated or revoked.  Performed at [facility], [place] [address] [place], [place] [address]   Lactic acid, plasma     Status: None   Collection Time: 04/18/21  4:35 PM  Result Value Ref Range   Lactic Acid, Venous 1.3 0.5 - 1.9 mmol/L    Comment: Performed at [facility] [address] [place], [place] [address]  Comprehensive metabolic panel     Status: Abnormal   Collection Time: 04/18/21  4:35 PM  Result Value Ref Range   Sodium 125 (L) 135 - 145 mmol/L   Potassium 4.0 3.5 - 5.1 mmol/L   Chloride 91 (L) 98 - 111 mmol/L   CO2 19 (L) 22 - 32 mmol/L   Glucose, Bld 323 (H) 70 -  99 mg/dL    Comment: Glucose reference range applies only to samples taken after fasting for at least 8 hours.   BUN 32 (H) 8 - 23 mg/dL   Creatinine, Ser 1.65 (H) 0.61 - 1.24 mg/dL   Calcium 8.3 (L) 8.9 - 10.3 mg/dL   Total Protein 6.7 6.5 - 8.1 g/dL   Albumin 2.5 (L) 3.5 - 5.0 g/dL   AST 38 15 - 41 U/L   ALT 39 0 - 44 U/L   Alkaline Phosphatase 105 38 - 126 U/L   Total Bilirubin 1.0 0.3 - 1.2 mg/dL   GFR, Estimated 43 (L) >60 mL/min    Comment: (NOTE) Calculated using the CKD-EPI Creatinine Equation (2021)    Anion gap 15 5 - 15    Comment: Performed at [facility] [address], [place], [place] [address]  CBC WITH DIFFERENTIAL     Status: Abnormal   Collection Time: 04/18/21  4:35 PM  Result Value Ref Range  WBC 23.6 (H) 4.0 - 10.5 K/uL   RBC 4.98 4.22 - 5.81 MIL/uL   Hemoglobin 14.7 13.0 - 17.0 g/dL   HCT 41.1 39.0 - 52.0 %   MCV 82.5 80.0 - 100.0 fL   MCH 29.5 26.0 - 34.0 pg   MCHC 35.8 30.0 - 36.0 g/dL   RDW 11.9 11.5 - 15.5 %   Platelets 297 150 - 400 K/uL   nRBC 0.0 0.0 - 0.2 %   Neutrophils Relative % 92 %   Neutro Abs 21.9 (H) 1.7 - 7.7 K/uL   Lymphocytes Relative 2 %   Lymphs Abs 0.4 (L) 0.7 - 4.0 K/uL   Monocytes Relative 5 %   Monocytes Absolute 1.1 (H) 0.1 - 1.0 K/uL   Eosinophils Relative 0 %   Eosinophils Absolute 0.0 0.0 - 0.5 K/uL   Basophils Relative 0 %   Basophils Absolute 0.1 0.0 - 0.1 K/uL   Immature Granulocytes 1 %   Abs Immature Granulocytes 0.16 (H) 0.00 - 0.07 K/uL    Comment: Performed at [facility] [address], [place], [place] [address]  Protime-INR     Status: None   Collection Time: 04/18/21  4:35 PM  Result Value Ref Range   Prothrombin Time 14.2 11.4 - 15.2 seconds   INR 1.1 0.8 - 1.2    Comment: (NOTE) INR goal varies based on device and disease states. Performed at [facility], [place] [address], [place], [place] [address]   APTT     Status: None   Collection Time: 04/18/21  4:35 PM  Result Value  Ref Range   aPTT 34 24 - 36 seconds    Comment: Performed at [facility] [address] [place], [place] [address]  Urinalysis, Routine w reflex microscopic Urine, Clean Catch     Status: Abnormal   Collection Time: 04/18/21  4:35 PM  Result Value Ref Range   Color, Urine YELLOW YELLOW   APPearance CLOUDY (A) CLEAR   Specific Gravity, Urine 1.025 1.005 - 1.030   pH 5.5 5.0 - 8.0   Glucose, UA >=500 (A) NEGATIVE mg/dL   Hgb urine dipstick LARGE (A) NEGATIVE   Bilirubin Urine NEGATIVE NEGATIVE   Ketones, ur NEGATIVE NEGATIVE mg/dL   Protein, ur 100 (A) NEGATIVE mg/dL   Nitrite NEGATIVE NEGATIVE   Leukocytes,Ua SMALL (A) NEGATIVE    Comment: Performed at [facility] [address] [place], [place] [address]  Urinalysis, Microscopic (reflex)     Status: Abnormal   Collection Time: 04/18/21  4:35 PM  Result Value Ref Range   RBC / HPF >50 0 - 5 RBC/hpf   WBC, UA >50 0 - 5 WBC/hpf   Bacteria, UA MANY (A) NONE SEEN   Squamous Epithelial / LPF 0-5 0 - 5   WBC Clumps PRESENT    Mucus PRESENT     Comment: Performed at [facility], [place] [address], [place], [place] [address]  C-reactive protein     Status: Abnormal   Collection Time: 04/18/21  7:05 PM  Result Value Ref Range   CRP 22.8 (H) <1.0 mg/dL    Comment: Performed at [facility], [place] [address] [place], [place] [address]  Sedimentation rate     Status: Abnormal   Collection Time: 04/18/21  7:05 PM  Result Value Ref Range   Sed Rate 86 (H) 0 - 16 mm/hr    Comment: Performed at [facility], [address] [place], [place] [address]  Procalcitonin - Baseline     Status: None   Collection Time: 04/18/21  7:05 PM  Result Value Ref Range   Procalcitonin 0.83 ng/mL    Comment:        Interpretation: PCT > 0.5 ng/mL and <= 2 ng/mL: Systemic infection (sepsis) is possible, but other conditions are known to elevate PCT as well. (NOTE)       Sepsis PCT Algorithm           Lower Respiratory  Tract                                      Infection PCT Algorithm    ----------------------------     ----------------------------         PCT < 0.25 ng/mL                PCT < 0.10 ng/mL          Strongly encourage             Strongly discourage   discontinuation of antibiotics    initiation of antibiotics    ----------------------------     -----------------------------       PCT 0.25 - 0.50 ng/mL            PCT 0.10 - 0.25 ng/mL               OR       >80% decrease in PCT            Discourage initiation of                                            antibiotics      Encourage discontinuation           of antibiotics    ----------------------------     -----------------------------         PCT >= 0.50 ng/mL              PCT 0.26 - 0.50 ng/mL                AND       <80% decrease in PCT             Encourage initiation of                                             antibiotics       Encourage continuation           of antibiotics    ----------------------------     -----------------------------        PCT >= 0.50 ng/mL                  PCT > 0.50 ng/mL               AND         increase in PCT                  Strongly encourage  initiation of antibiotics    Strongly encourage escalation           of antibiotics                                     -----------------------------                                           PCT <= 0.25 ng/mL                                                 OR                                        > 80% decrease in PCT                                      Discontinue / Do not initiate                                             antibiotics  Performed at [facility] [address], [place], [place] [address]   CBG monitoring, ED     Status: Abnormal   Collection Time: 04/18/21  7:06 PM  Result Value Ref Range   Glucose-Capillary 325 (H) 70 - 99 mg/dL    Comment: Glucose reference range  applies only to samples taken after fasting for at least 8 hours.  TSH     Status: None   Collection Time: 04/18/21  8:09 PM  Result Value Ref Range   TSH 1.263 0.350 - 4.500 uIU/mL    Comment: Performed by a 3rd Generation assay with a functional sensitivity of <=0.01 uIU/mL. Performed at [facility], [place] [address], [place], [place] [address]   Strep pneumoniae urinary antigen     Status: None   Collection Time: 04/18/21  8:09 PM  Result Value Ref Range   Strep Pneumo Urinary Antigen NEGATIVE NEGATIVE    Comment:        Infection due to S. pneumoniae cannot be absolutely ruled out since the antigen present may be below the detection limit of the test. Performed at [facility], [place] [address], [place], [place] [address]   Respiratory (~20 pathogens) panel by PCR     Status: None   Collection Time: 04/18/21  8:09 PM   Specimen: Nasopharyngeal Swab; Respiratory  Result Value Ref Range   Adenovirus NOT DETECTED NOT DETECTED   Coronavirus 229E NOT DETECTED NOT DETECTED    Comment: (NOTE) The Coronavirus on the Respiratory Panel, DOES NOT test for the novel  Coronavirus (2019 nCoV)    Coronavirus HKU1 NOT DETECTED NOT DETECTED   Coronavirus NL63 NOT DETECTED NOT DETECTED   Coronavirus OC43 NOT DETECTED NOT DETECTED   Metapneumovirus NOT DETECTED NOT DETECTED   Rhinovirus / Enterovirus NOT DETECTED NOT DETECTED  Influenza A NOT DETECTED NOT DETECTED   Influenza B NOT DETECTED NOT DETECTED   Parainfluenza Virus 1 NOT DETECTED NOT DETECTED   Parainfluenza Virus 2 NOT DETECTED NOT DETECTED   Parainfluenza Virus 3 NOT DETECTED NOT DETECTED   Parainfluenza Virus 4 NOT DETECTED NOT DETECTED   Respiratory Syncytial Virus NOT DETECTED NOT DETECTED   Bordetella pertussis NOT DETECTED NOT DETECTED   Bordetella Parapertussis NOT DETECTED NOT DETECTED   Chlamydophila pneumoniae NOT DETECTED NOT DETECTED   Mycoplasma pneumoniae NOT DETECTED NOT DETECTED    Comment:  Performed at [facility], [place] [address], [place], [place] [address]  Sodium, urine, random     Status: None   Collection Time: 04/18/21  8:09 PM  Result Value Ref Range   Sodium, Ur 43 mmol/L    Comment: Performed at [facility] [address] [place], [place] [address]  CBG monitoring, ED     Status: Abnormal   Collection Time: 04/18/21  9:57 PM  Result Value Ref Range   Glucose-Capillary 373 (H) 70 - 99 mg/dL    Comment: Glucose reference range applies only to samples taken after fasting for at least 8 hours.   Comment 1 Notify RN    Comment 2 Document in Chart   CBG monitoring, ED     Status: Abnormal   Collection Time: 04/19/21  2:01 AM  Result Value Ref Range   Glucose-Capillary 404 (H) 70 - 99 mg/dL    Comment: Glucose reference range applies only to samples taken after fasting for at least 8 hours.  Basic metabolic panel     Status: Abnormal   Collection Time: 04/19/21  3:47 AM  Result Value Ref Range   Sodium 125 (L) 135 - 145 mmol/L   Potassium 4.3 3.5 - 5.1 mmol/L   Chloride 94 (L) 98 - 111 mmol/L   CO2 16 (L) 22 - 32 mmol/L   Glucose, Bld 400 (H) 70 - 99 mg/dL    Comment: Glucose reference range applies only to samples taken after fasting for at least 8 hours.   BUN 35 (H) 8 - 23 mg/dL   Creatinine, Ser 1.81 (H) 0.61 - 1.24 mg/dL   Calcium 7.9 (L) 8.9 - 10.3 mg/dL   GFR, Estimated 38 (L) >60 mL/min    Comment: (NOTE) Calculated using the CKD-EPI Creatinine Equation (2021)    Anion gap 15 5 - 15    Comment: Performed at [facility] [address] [place], [place] [address]  CBC     Status: Abnormal   Collection Time: 04/19/21  3:47 AM  Result Value Ref Range   WBC 25.3 (H) 4.0 - 10.5 K/uL   RBC 4.73 4.22 - 5.81 MIL/uL   Hemoglobin 13.9 13.0 - 17.0 g/dL   HCT 39.7 39.0 - 52.0 %   MCV 83.9 80.0 - 100.0 fL   MCH 29.4 26.0 - 34.0 pg   MCHC 35.0 30.0 - 36.0 g/dL   RDW 12.1 11.5 - 15.5 %   Platelets 260 150 - 400 K/uL   nRBC 0.0 0.0 - 0.2  %    Comment: Performed at [facility], [place] [address] [place], [place] [address]  CBG monitoring, ED     Status: Abnormal   Collection Time: 04/19/21  6:37 AM  Result Value Ref Range   Glucose-Capillary 450 (H) 70 - 99 mg/dL    Comment: Glucose reference range applies only to samples taken after fasting for  at least 8 hours.    DG CHEST PORT 1 VIEW  Result Date: 04/19/2021 CLINICAL DATA:  Dyspnea EXAM: PORTABLE CHEST 1 VIEW COMPARISON:  04/18/2021 FINDINGS: The heart size and mediastinal contours are within normal limits. Both lungs are clear. The visualized skeletal structures are unremarkable. IMPRESSION: No active disease. Electronically Signed   By: Ulyses Jarred M.D.   On: 04/19/2021 02:30   DG Chest Port 1 View  Result Date: 04/18/2021 CLINICAL DATA:  Questionable sepsis - evaluate for abnormality Weakness, fever, shortness of breath, cough. EXAM: PORTABLE CHEST 1 VIEW COMPARISON:  Radiograph 11/27/2011 FINDINGS: Lung volumes are low. Mild cardiomegaly, likely accentuated by technique. Normal mediastinal contours with aortic atherosclerosis. Mild patchy opacity at the left lung base. No large pleural effusion. No pneumothorax. No pulmonary edema. No acute osseous abnormalities are seen. IMPRESSION: 1. Patchy opacity at the left lung base, may represent atelectasis or pneumonia. 2. Mild cardiomegaly, likely accentuated by technique. Electronically Signed   By: Keith Rake M.D.   On: 04/18/2021 17:17   DG Foot Complete Left  Result Date: 04/18/2021 CLINICAL DATA:  Wound. EXAM: LEFT FOOT - COMPLETE 3+ VIEW COMPARISON:  No prior foot exams. FINDINGS: Prior resection of the first and second toes. There is a fracture of the third toe proximal phalanx of unknown acuity. Soft tissue defect subjacent to the metatarsal phalangeal joint joints, seen on the lateral view, tentatively visualized overlying the 1st-2nd intertarsal space. Subcortical cystic change about the distal metatarsal head  but no frank erosions. Chronic degenerative change of the midfoot. Large plantar calcaneal spur and Achilles tendon enthesophyte. IMPRESSION: 1. Soft tissue defect subjacent to the metatarsophalangeal joints, tentatively visualized overlying the 1st-2nd intertarsal space, likely representing site of wound. 2. Prior resection of the first and second toes. Subcortical cystic change of the first metatarsal head is of unknown acuity, favor chronic. No definite radiographic findings of osteomyelitis. 3. Fracture of the third toe proximal phalanx of unknown acuity. Electronically Signed   By: Keith Rake M.D.   On: 04/18/2021 17:24   DG Toe Great Right  Result Date: 04/18/2021 CLINICAL DATA:  Wound. EXAM: RIGHT GREAT TOE COMPARISON:  Foot radiograph 09/14/2019 FINDINGS: Decreased density the medial aspect of the great toe distal phalanx, with overlying thinning of the soft tissues, level pathologic fracture. There are small erosions about the medial aspect of the first metatarsal phalangeal joint. Osteoarthritis of the first metatarsal phalangeal joint. Diffuse soft tissue edema about the proximal digit. Prior resection of the second toe. No radiopaque foreign body. IMPRESSION: 1. 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 2. Small erosions about the medial aspect first metatarsophalangeal joint. 3. Diffuse soft tissue edema about the proximal digit. Electronically Signed   By: Keith Rake M.D.   On: 04/18/2021 17:20    Review of Systems Blood pressure 123/78, pulse (!) 104, temperature (!) 102.2 F (39 C), temperature source Rectal, resp. rate (!) 30, SpO2 97 %. Physical Exam General: NAD-currently states that his pain is controlled and denies any fevers or chills.  Dermatological: Ulceration noted to the right medial hallux with an eschar noted.  There is no surrounding edema or erythema.  There is no drainage or pus.  No fluctuation or  crepitation.  On the left side gangrenous changes present distal aspect of the fourth toe which appears to be dry.  There is no drainage or pus.       Vascular: Decreased pulses bilaterally  Neruologic: Sensation  decreased  Musculoskeletal: Prior digital amputations left foot.  Gait: Unassisted, Nonantalgic.   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  I discussed with the patient likely need for amputation of the right hallux.  Patient understands this await radiology report.  We will keep him n.p.o. in case of surgery later on today versus surgery tomorrow.  Ordered updated arterial studies.  Continue IV antibiotics.  Podiatry will continue to follow. Recommend IR vs vascular consult.   I have updated [name] who is on-call for our practice today.  Trula Slade 04/19/2021, 7:29 AM

## 2021-04-19 NOTE — Consult Note (Signed)
[name] for Infectious Disease    Date of Admission:  04/18/2021     Total days of antibiotics 2               Reason for Consult: MSSA Bacteremia   Referring Provider: Tivis Ringer / [employer]  Primary Care Provider: [name], FNP   ASSESSMENT:  Zachary [name] is a 76 y/o male with diabetes admitted with leg pain and fever POD #5 from angiogram and found to have MSSA bacteremia and osteomyelitis of the right hallux. Suspect the primary source of infection is surgical site at this point although cannot fully rule out right hallux. Antibiotics narrowed to Cefazolin. Recheck blood cultures to check for clearance of bacteremia. TTE to check for endocarditis.  Podiatry obtaining additional vascular studies prior to likely right hallux amputation. Will check [place] of right groin for abscess. Discussed that he remains at high risk for complicated healing and continued infection.  Continue current dose of Cefazolin. Remaining medical and supportive care per primary team.   PLAN:  Continue cefazolin Repeat blood cultures to check clearance of bacteremia. TTE to check for endocarditis.  [place] right groin to evaluate for hematoma/abscess Await vascular study results and likely surgical intervention by Podiatry. Opitimize diabetes control and nutrition Remaining medical and supportive care per primary team.    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 osteomyelitis of rigth great toe    Hyponatremia   Osteomyelitis (HCC)    diltiazem  120 mg Oral Daily   insulin aspart  0-9 Units Subcutaneous TID WC   insulin glargine-yfgn  20 Units Subcutaneous Daily     HPI: Zachary [name] is a 76 y.o. male  with previous medical history of diabetes, hypertension, CKD Stage III and  peripheral vascular disease admitted with concerns for shortness of breath, cough, fever and lower extremity pain.  Zachary [name] had a right lower extremity angiogram performed on 04/14/21 with findings of anterior tibial occlusion in the first third and posterior tibial occlusion proximally on initial then in-line flow into the pedal arteries/forefoot on conclusion with recommendation for revascularization on once he recovered. Has also been followed by Dr. Posey Pronto for diabetic foot ulcer with worsening noted on most recent visit on 04/09/21 with wound probing down to deeper tissue with exposure of bone and grafting failure. Also being followed for left third distal tip dry/superficial gangrene. Following the procedure he began having pain in his legs and right sided lower back pain.   Max temperature in the last 24 hours of 103.4 F and leukocytosis with WBC count of 25.3. Chest x-ray with atelectasis. Right great toe x-ray with findings suspicious for osteomyelitis of the great toe distal phalanx with pathologic fracture and small erosions about the medial aspect first metatarsophalangeal joint. X-ray left foot with no osteomyelitis. MRI right foot with findings concerning for osteomyelitis of the distal phalanx great than proximal phalanx of the great toe. Podiatry consulted and recommended amputation of right hallux.  Additional vascular studies are being obtained.  Zachary [name] blood cultures are now positive for MSSA. Initially started on broad spectrum coverage with cefepime, ceftriaxone, metronidazole, and vancomycin. Antibiotics have been narrowed to Cefazolin.   Review of  Systems: Review of Systems  Constitutional:  Positive for fever and malaise/fatigue. Negative for chills and weight loss.  Respiratory:  Negative for cough, shortness of breath and wheezing.   Cardiovascular:  Negative for chest pain and leg swelling.   Gastrointestinal:  Negative for abdominal pain, constipation, diarrhea, nausea and vomiting.  Musculoskeletal:        Positive for leg pain  Skin:  Negative for rash.    Past Medical History:  Diagnosis Date   Diabetes mellitus    Hypercholesteremia    Hypertension    Patella fracture    left   PVD (peripheral vascular disease) (HCC)     Social History   Tobacco Use   Smoking status: Never   Smokeless tobacco: Never  Substance Use Topics   Alcohol use: No   Drug use: No    No family history on file.  Allergies  Allergen Reactions   Metformin Hcl Other (See Comments)    Unknown reaction   Pravastatin Sodium Other (See Comments)    Muscle pain     OBJECTIVE: Blood pressure 122/75, pulse 87, temperature 98.6 F (37 C), temperature source Oral, resp. rate 18, SpO2 97 %.  Physical Exam Constitutional:      General: He is not in acute distress.    Appearance: He is well-developed.  Cardiovascular:     Rate and Rhythm: Normal rate and regular rhythm.     Heart sounds: Normal heart sounds.  Pulmonary:     Effort: Pulmonary effort is normal.     Breath sounds: Normal breath sounds.  Skin:    General: Skin is warm and dry.  Neurological:     Mental Status: He is alert and oriented to person, place, and time.  Psychiatric:        Behavior: Behavior normal.        Thought Content: Thought content normal.        Judgment: Judgment normal.           Right Foot   Left foot   Lab Results Lab Results  Component Value Date   WBC 25.3 (H) 04/19/2021   HGB 13.9 04/19/2021   HCT 39.7 04/19/2021   MCV 83.9 04/19/2021   PLT 260 04/19/2021    Lab Results  Component Value Date   CREATININE 1.81 (H) 04/19/2021   BUN 35 (H) 04/19/2021   NA 125 (L) 04/19/2021   K 4.3 04/19/2021   CL 94 (L) 04/19/2021   CO2 16 (L) 04/19/2021    Lab Results  Component Value Date   ALT 39 04/18/2021   AST 38 04/18/2021   ALKPHOS 105 04/18/2021   BILITOT 1.0 04/18/2021      Microbiology: Recent Results (from the past 240 hour(s))  Resp Panel by RT-PCR (Flu A&B, Covid) Nasopharyngeal Swab     Status: None   Collection Time: 04/18/21  4:35 PM   Specimen: Nasopharyngeal Swab; Nasopharyngeal(NP) swabs in vial transport medium  Result Value Ref Range Status   SARS Coronavirus 2 by RT PCR NEGATIVE NEGATIVE Final    Comment: (NOTE) SARS-CoV-2 target nucleic acids are NOT DETECTED.  The SARS-CoV-2 RNA is generally detectable in upper respiratory specimens during the acute phase of infection. The lowest concentration of SARS-CoV-2 viral copies this assay can detect is 138 copies/mL. A negative result does not preclude SARS-Cov-2 infection and should not be used as the sole basis for treatment or other patient management decisions. A negative result may occur with  improper specimen collection/handling,  submission of specimen other than nasopharyngeal swab, presence of viral mutation(s) within the areas targeted by this assay, and inadequate number of viral copies(<138 copies/mL). A negative result must be combined with clinical observations, patient history, and epidemiological information. The expected result is Negative.  Fact Sheet for Patients:  EntrepreneurPulse.com.au  Fact Sheet for Healthcare Providers:  IncredibleEmployment.be  This test is no t yet approved or cleared by the [place] FDA and  has been authorized for detection and/or diagnosis of SARS-CoV-2 by FDA under an Emergency Use Authorization (EUA). This EUA will remain  in effect (meaning this test can be used) for the duration of the COVID-19 declaration under Section 564(b)(1) of the Act, 21 U.S.C.section 360bbb-3(b)(1), unless the authorization is terminated  or revoked sooner.       Influenza A by PCR NEGATIVE NEGATIVE Final   Influenza B by PCR NEGATIVE NEGATIVE Final    Comment: (NOTE) The Xpert Xpress SARS-CoV-2/FLU/RSV plus assay is  intended as an aid in the diagnosis of influenza from Nasopharyngeal swab specimens and should not be used as a sole basis for treatment. Nasal washings and aspirates are unacceptable for Xpert Xpress SARS-CoV-2/FLU/RSV testing.  Fact Sheet for Patients: EntrepreneurPulse.com.au  Fact Sheet for Healthcare Providers: IncredibleEmployment.be  This test is not yet approved or cleared by the [place] FDA and has been authorized for detection and/or diagnosis of SARS-CoV-2 by FDA under an Emergency Use Authorization (EUA). This EUA will remain in effect (meaning this test can be used) for the duration of the COVID-19 declaration under Section 564(b)(1) of the Act, 21 U.S.C. section 360bbb-3(b)(1), unless the authorization is terminated or revoked.  Performed at [facility], [place] [address], [place], [place] [address]   Blood Culture (routine x 2)     Status: None (Preliminary result)   Collection Time: 04/18/21  4:40 PM   Specimen: BLOOD RIGHT FOREARM  Result Value Ref Range Status   Specimen Description BLOOD RIGHT FOREARM  Final   Special Requests   Final    BOTTLES DRAWN AEROBIC AND ANAEROBIC Blood Culture results may not be optimal due to an excessive volume of blood received in culture bottles   Culture  Setup Time   Final    GRAM POSITIVE COCCI IN CLUSTERS IN BOTH AEROBIC AND ANAEROBIC BOTTLES CRITICAL VALUE NOTED.  VALUE IS CONSISTENT WITH PREVIOUSLY REPORTED AND CALLED VALUE. Performed at [facility], [place] [address], [place], [place] [address]    Culture GRAM POSITIVE COCCI  Final   Report Status PENDING  Incomplete  Blood Culture (routine x 2)     Status: None (Preliminary result)   Collection Time: 04/18/21  5:10 PM   Specimen: BLOOD RIGHT HAND  Result Value Ref Range Status   Specimen Description BLOOD RIGHT HAND  Final   Special Requests   Final    BOTTLES DRAWN AEROBIC AND ANAEROBIC Blood Culture results may not  be optimal due to an inadequate volume of blood received in culture bottles   Culture  Setup Time   Final    GRAM POSITIVE COCCI IN CLUSTERS IN BOTH AEROBIC AND ANAEROBIC BOTTLES CRITICAL RESULT CALLED TO, READ BACK BY AND VERIFIED WITH: Carla Drape [id number] AT 67 BY CM Performed at [facility], [place] [address], [place], [place] [address]    Culture [facility] POSITIVE COCCI  Final   Report Status PENDING  Incomplete  Blood Culture ID Panel (Reflexed)     Status: Abnormal   Collection  Time: 04/18/21  5:10 PM  Result Value Ref Range Status   Enterococcus faecalis NOT DETECTED NOT DETECTED Final   Enterococcus Faecium NOT DETECTED NOT DETECTED Final   Listeria monocytogenes NOT DETECTED NOT DETECTED Final   Staphylococcus species DETECTED (A) NOT DETECTED Final    Comment: CRITICAL RESULT CALLED TO, READ BACK BY AND VERIFIED WITH: PHARMD C JACKSON [id number] AT 1037 BY CM    Staphylococcus aureus (BCID) DETECTED (A) NOT DETECTED Final    Comment: CRITICAL RESULT CALLED TO, READ BACK BY AND VERIFIED WITH: PHARMD C JACKSON [id number] AT 1037 BY CM    Staphylococcus epidermidis NOT DETECTED NOT DETECTED Final   Staphylococcus lugdunensis NOT DETECTED NOT DETECTED Final   Streptococcus species NOT DETECTED NOT DETECTED Final   Streptococcus agalactiae NOT DETECTED NOT DETECTED Final   Streptococcus pneumoniae NOT DETECTED NOT DETECTED Final   Streptococcus pyogenes NOT DETECTED NOT DETECTED Final   A.calcoaceticus-baumannii NOT DETECTED NOT DETECTED Final   Bacteroides fragilis NOT DETECTED NOT DETECTED Final   Enterobacterales NOT DETECTED NOT DETECTED Final   Enterobacter cloacae complex NOT DETECTED NOT DETECTED Final   Escherichia coli NOT DETECTED NOT DETECTED Final   Klebsiella aerogenes NOT DETECTED NOT DETECTED Final   Klebsiella oxytoca NOT DETECTED NOT DETECTED Final   Klebsiella pneumoniae NOT DETECTED NOT DETECTED Final   Proteus species NOT DETECTED NOT DETECTED Final    Salmonella species NOT DETECTED NOT DETECTED Final   Serratia marcescens NOT DETECTED NOT DETECTED Final   Haemophilus influenzae NOT DETECTED NOT DETECTED Final   Neisseria meningitidis NOT DETECTED NOT DETECTED Final   Pseudomonas aeruginosa NOT DETECTED NOT DETECTED Final   Stenotrophomonas maltophilia NOT DETECTED NOT DETECTED Final   Candida albicans NOT DETECTED NOT DETECTED Final   Candida auris NOT DETECTED NOT DETECTED Final   Candida glabrata NOT DETECTED NOT DETECTED Final   Candida krusei NOT DETECTED NOT DETECTED Final   Candida parapsilosis NOT DETECTED NOT DETECTED Final   Candida tropicalis NOT DETECTED NOT DETECTED Final   Cryptococcus neoformans/gattii NOT DETECTED NOT DETECTED Final   Meth resistant mecA/C and MREJ NOT DETECTED NOT DETECTED Final    Comment: Performed at [facility], [address] [place], [place] [address]  Respiratory (~20 pathogens) panel by PCR     Status: None   Collection Time: 04/18/21  8:09 PM   Specimen: Nasopharyngeal Swab; Respiratory  Result Value Ref Range Status   Adenovirus NOT DETECTED NOT DETECTED Final   Coronavirus 229E NOT DETECTED NOT DETECTED Final    Comment: (NOTE) The Coronavirus on the Respiratory Panel, DOES NOT test for the novel  Coronavirus (2019 nCoV)    Coronavirus HKU1 NOT DETECTED NOT DETECTED Final   Coronavirus NL63 NOT DETECTED NOT DETECTED Final   Coronavirus OC43 NOT DETECTED NOT DETECTED Final   Metapneumovirus NOT DETECTED NOT DETECTED Final   Rhinovirus / Enterovirus NOT DETECTED NOT DETECTED Final   Influenza A NOT DETECTED NOT DETECTED Final   Influenza B NOT DETECTED NOT DETECTED Final   Parainfluenza Virus 1 NOT DETECTED NOT DETECTED Final   Parainfluenza Virus 2 NOT DETECTED NOT DETECTED Final   Parainfluenza Virus 3 NOT DETECTED NOT DETECTED Final   Parainfluenza Virus 4 NOT DETECTED NOT DETECTED Final   Respiratory Syncytial Virus NOT DETECTED NOT DETECTED Final   Bordetella pertussis  NOT DETECTED NOT DETECTED Final   Bordetella Parapertussis NOT DETECTED NOT DETECTED Final   Chlamydophila pneumoniae NOT DETECTED NOT DETECTED Final   Mycoplasma  pneumoniae NOT DETECTED NOT DETECTED Final    Comment: Performed at [facility], [place] [address] [place], [place] [address]     Terri Piedra, [place] [facility]  04/19/2021  3:18 PM

## 2021-04-19 NOTE — ED Notes (Signed)
Pt transported to vascular.  °

## 2021-04-20 ENCOUNTER — Inpatient Hospital Stay (HOSPITAL_COMMUNITY): Payer: Medicare PPO | Admitting: Certified Registered Nurse Anesthetist

## 2021-04-20 ENCOUNTER — Encounter (HOSPITAL_COMMUNITY)
Admission: EM | Disposition: A | Discharge status: Skilled Nursing Facility | Payer: Self-pay | Source: Home / Self Care | Attending: Internal Medicine

## 2021-04-20 ENCOUNTER — Encounter (HOSPITAL_COMMUNITY): Payer: Self-pay | Admitting: Internal Medicine

## 2021-04-20 ENCOUNTER — Inpatient Hospital Stay (HOSPITAL_COMMUNITY): Payer: Medicare PPO

## 2021-04-20 ENCOUNTER — Other Ambulatory Visit: Payer: Self-pay

## 2021-04-20 DIAGNOSIS — R7881 Bacteremia: Secondary | ICD-10-CM

## 2021-04-20 HISTORY — PX: AMPUTATION TOE: SHX6595

## 2021-04-20 LAB — ECHOCARDIOGRAM COMPLETE
AR max vel: 0.79 cm2
AV Area VTI: 0.8 cm2
AV Area mean vel: 0.8 cm2
AV Mean grad: 28 mmHg
AV Peak grad: 58.4 mmHg
Ao pk vel: 3.82 m/s
Area-P 1/2: 1.29 cm2
Height: 68 in
S' Lateral: 2.7 cm
Weight: 3040 oz

## 2021-04-20 LAB — COMPREHENSIVE METABOLIC PANEL
ALT: 29 U/L (ref 0–44)
AST: 22 U/L (ref 15–41)
Albumin: 1.9 g/dL — ABNORMAL LOW (ref 3.5–5.0)
Alkaline Phosphatase: 78 U/L (ref 38–126)
Anion gap: 8 (ref 5–15)
BUN: 38 mg/dL — ABNORMAL HIGH (ref 8–23)
CO2: 23 mmol/L (ref 22–32)
Calcium: 7.5 mg/dL — ABNORMAL LOW (ref 8.9–10.3)
Chloride: 97 mmol/L — ABNORMAL LOW (ref 98–111)
Creatinine, Ser: 1.57 mg/dL — ABNORMAL HIGH (ref 0.61–1.24)
GFR, Estimated: 45 mL/min — ABNORMAL LOW (ref >60)
Glucose, Bld: 248 mg/dL — ABNORMAL HIGH (ref 70–99)
Potassium: 3.6 mmol/L (ref 3.5–5.1)
Sodium: 128 mmol/L — ABNORMAL LOW (ref 135–145)
Total Bilirubin: 0.4 mg/dL (ref 0.3–1.2)
Total Protein: 5.3 g/dL — ABNORMAL LOW (ref 6.5–8.1)

## 2021-04-20 LAB — PHOSPHORUS: Phosphorus: 2.9 mg/dL (ref 2.5–4.6)

## 2021-04-20 LAB — GLUCOSE, CAPILLARY
Glucose-Capillary: 237 mg/dL — ABNORMAL HIGH (ref 70–99)
Glucose-Capillary: 248 mg/dL — ABNORMAL HIGH (ref 70–99)
Glucose-Capillary: 217 mg/dL — ABNORMAL HIGH (ref 70–99)
Glucose-Capillary: 205 mg/dL — ABNORMAL HIGH (ref 70–99)
Glucose-Capillary: 194 mg/dL — ABNORMAL HIGH (ref 70–99)
Glucose-Capillary: 216 mg/dL — ABNORMAL HIGH (ref 70–99)

## 2021-04-20 LAB — MAGNESIUM: Magnesium: 2.2 mg/dL (ref 1.7–2.4)

## 2021-04-20 LAB — CBC
HCT: 36.6 % — ABNORMAL LOW (ref 39.0–52.0)
Hemoglobin: 12.8 g/dL — ABNORMAL LOW (ref 13.0–17.0)
MCH: 29.5 pg (ref 26.0–34.0)
MCHC: 35 g/dL (ref 30.0–36.0)
MCV: 84.3 fL (ref 80.0–100.0)
Platelets: 243 10*3/uL (ref 150–400)
RBC: 4.34 MIL/uL (ref 4.22–5.81)
RDW: 12.2 % (ref 11.5–15.5)
WBC: 15.4 10*3/uL — ABNORMAL HIGH (ref 4.0–10.5)
nRBC: 0 % (ref 0.0–0.2)

## 2021-04-20 LAB — SURGICAL PCR SCREEN
MRSA, PCR: NEGATIVE
Staphylococcus aureus: NEGATIVE

## 2021-04-20 LAB — PROCALCITONIN: Procalcitonin: 4.67 ng/mL

## 2021-04-20 LAB — PREALBUMIN: Prealbumin: 6.7 mg/dL — ABNORMAL LOW (ref 18–38)

## 2021-04-20 LAB — HEMOGLOBIN A1C
Hgb A1c MFr Bld: 8.5 % — ABNORMAL HIGH (ref 4.8–5.6)
Mean Plasma Glucose: 197.25 mg/dL

## 2021-04-20 SURGERY — AMPUTATION, TOE
Anesthesia: Monitor Anesthesia Care | Site: Toe | Laterality: Right

## 2021-04-20 MED ORDER — ONDANSETRON HCL 4 MG/2ML IJ SOLN
INTRAMUSCULAR | Status: AC
  Filled 2021-04-20: qty 2

## 2021-04-20 MED ORDER — INSULIN ASPART 100 UNIT/ML IJ SOLN
5.0000 [IU] | Freq: Once | INTRAMUSCULAR | Status: AC
  Administered 2021-04-20: 5 [IU] via SUBCUTANEOUS

## 2021-04-20 MED ORDER — PROPOFOL 10 MG/ML IV BOLUS
INTRAVENOUS | Status: DC | PRN
  Administered 2021-04-20: 50 mg via INTRAVENOUS

## 2021-04-20 MED ORDER — PROMETHAZINE HCL 25 MG/ML IJ SOLN: 6.2500 mg | INTRAMUSCULAR | Status: DC | PRN

## 2021-04-20 MED ORDER — OXYCODONE HCL 5 MG/5ML PO SOLN: 5.0000 mg | Freq: Once | ORAL | Status: DC | PRN

## 2021-04-20 MED ORDER — ACETAMINOPHEN 160 MG/5ML PO SOLN: 325.0000 mg | ORAL | Status: DC | PRN

## 2021-04-20 MED ORDER — PHENYLEPHRINE 40 MCG/ML (10ML) SYRINGE FOR IV PUSH (FOR BLOOD PRESSURE SUPPORT)
PREFILLED_SYRINGE | INTRAVENOUS | Status: AC
  Filled 2021-04-20: qty 10

## 2021-04-20 MED ORDER — INSULIN ASPART 100 UNIT/ML IJ SOLN
INTRAMUSCULAR | Status: AC
  Filled 2021-04-20: qty 1

## 2021-04-20 MED ORDER — 0.9 % SODIUM CHLORIDE (POUR BTL) OPTIME
TOPICAL | Status: DC | PRN
  Administered 2021-04-20: 1000 mL

## 2021-04-20 MED ORDER — CEFAZOLIN SODIUM-DEXTROSE 2-4 GM/100ML-% IV SOLN: 2.0000 g | INTRAVENOUS | Status: DC

## 2021-04-20 MED ORDER — ORAL CARE MOUTH RINSE: 15.0000 mL | Freq: Once | OROMUCOSAL | Status: AC

## 2021-04-20 MED ORDER — CHLORHEXIDINE GLUCONATE 0.12 % MT SOLN: 15.0000 mL | Freq: Once | OROMUCOSAL | Status: AC

## 2021-04-20 MED ORDER — ACETAMINOPHEN 10 MG/ML IV SOLN: 1000.0000 mg | Freq: Once | INTRAVENOUS | Status: DC | PRN

## 2021-04-20 MED ORDER — VANCOMYCIN HCL 1 G IV SOLR
INTRAVENOUS | Status: DC | PRN
  Administered 2021-04-20: 1000 mg via TOPICAL

## 2021-04-20 MED ORDER — LIDOCAINE HCL (PF) 2 % IJ SOLN
INTRAMUSCULAR | Status: DC | PRN
Start: 2021-04-20 — End: 2021-04-20
  Administered 2021-04-20: 40 mg via INTRADERMAL

## 2021-04-20 MED ORDER — ACETAMINOPHEN 325 MG PO TABS: 325.0000 mg | ORAL_TABLET | ORAL | Status: DC | PRN

## 2021-04-20 MED ORDER — PERFLUTREN LIPID MICROSPHERE
1.0000 mL | INTRAVENOUS | Status: AC | PRN
  Administered 2021-04-20: 2 mL via INTRAVENOUS
  Filled 2021-04-20: qty 10

## 2021-04-20 MED ORDER — ONDANSETRON HCL 4 MG/2ML IJ SOLN
INTRAMUSCULAR | Status: DC | PRN
  Administered 2021-04-20: 4 mg via INTRAVENOUS

## 2021-04-20 MED ORDER — BUPIVACAINE HCL (PF) 0.5 % IJ SOLN
INTRAMUSCULAR | Status: AC
  Filled 2021-04-20: qty 10

## 2021-04-20 MED ORDER — VANCOMYCIN HCL 1000 MG IV SOLR
INTRAVENOUS | Status: AC
  Filled 2021-04-20: qty 20

## 2021-04-20 MED ORDER — LACTATED RINGERS IV SOLN: INTRAVENOUS | Status: DC

## 2021-04-20 MED ORDER — OXYCODONE HCL 5 MG PO TABS: 5.0000 mg | ORAL_TABLET | Freq: Once | ORAL | Status: DC | PRN

## 2021-04-20 MED ORDER — CHLORHEXIDINE GLUCONATE 0.12 % MT SOLN
OROMUCOSAL | Status: AC
  Administered 2021-04-20: 15 mL via OROMUCOSAL
  Filled 2021-04-20: qty 15

## 2021-04-20 MED ORDER — SENNOSIDES-DOCUSATE SODIUM 8.6-50 MG PO TABS
2.0000 | ORAL_TABLET | Freq: Every day | ORAL | Status: DC
  Administered 2021-04-20 – 2021-04-24 (×5): 2 via ORAL
  Filled 2021-04-20 (×5): qty 2

## 2021-04-20 MED ORDER — PROPOFOL 500 MG/50ML IV EMUL
INTRAVENOUS | Status: DC | PRN
  Administered 2021-04-20: 50 ug/kg/min via INTRAVENOUS

## 2021-04-20 MED ORDER — PHENYLEPHRINE 40 MCG/ML (10ML) SYRINGE FOR IV PUSH (FOR BLOOD PRESSURE SUPPORT)
PREFILLED_SYRINGE | INTRAVENOUS | Status: DC | PRN
Start: 2021-04-20 — End: 2021-04-20
  Administered 2021-04-20: 40 ug via INTRAVENOUS

## 2021-04-20 MED ORDER — LIDOCAINE 2% (20 MG/ML) 5 ML SYRINGE
INTRAMUSCULAR | Status: AC
  Filled 2021-04-20: qty 5

## 2021-04-20 MED ORDER — FENTANYL CITRATE (PF) 100 MCG/2ML IJ SOLN: 25.0000 ug | INTRAMUSCULAR | Status: DC | PRN

## 2021-04-20 MED ORDER — BUPIVACAINE HCL 0.5 % IJ SOLN
INTRAMUSCULAR | Status: DC | PRN
  Administered 2021-04-20: 10 mL

## 2021-04-20 SURGICAL SUPPLY — 37 items
BAG COUNTER SPONGE SURGICOUNT (BAG) ×2 IMPLANT
BAG SPNG CNTER NS LX DISP (BAG) ×1
BLADE LONG MED 31X9 (MISCELLANEOUS) ×2 IMPLANT
BNDG ELASTIC 4X5.8 VLCR STR LF (GAUZE/BANDAGES/DRESSINGS) ×2 IMPLANT
BNDG GAUZE ELAST 4 BULKY (GAUZE/BANDAGES/DRESSINGS) ×2 IMPLANT
CNTNR URN SCR LID CUP LEK RST (MISCELLANEOUS) IMPLANT
CONT SPEC 4OZ STRL OR WHT (MISCELLANEOUS) ×4
CORD BIPOLAR FORCEPS 12FT (ELECTRODE) IMPLANT
COVER SURGICAL LIGHT HANDLE (MISCELLANEOUS) ×2 IMPLANT
DRSG EMULSION OIL 3X3 NADH (GAUZE/BANDAGES/DRESSINGS) ×2 IMPLANT
ELECT REM PT RETURN 9FT ADLT (ELECTROSURGICAL) ×2
ELECTRODE REM PT RTRN 9FT ADLT (ELECTROSURGICAL) ×1 IMPLANT
GAUZE SPONGE 4X4 12PLY STRL (GAUZE/BANDAGES/DRESSINGS) ×2 IMPLANT
GLOVE SRG 8 PF TXTR STRL LF DI (GLOVE) ×1 IMPLANT
GLOVE SURG ENC MOIS LTX SZ7.5 (GLOVE) ×2 IMPLANT
GLOVE SURG UNDER POLY LF SZ8 (GLOVE) ×2
GOWN STRL REUS W/ TWL LRG LVL3 (GOWN DISPOSABLE) ×1 IMPLANT
GOWN STRL REUS W/ TWL XL LVL3 (GOWN DISPOSABLE) ×1 IMPLANT
GOWN STRL REUS W/TWL LRG LVL3 (GOWN DISPOSABLE) ×2
GOWN STRL REUS W/TWL XL LVL3 (GOWN DISPOSABLE) ×2
KIT BASIN OR (CUSTOM PROCEDURE TRAY) ×2 IMPLANT
KIT TURNOVER KIT B (KITS) ×2 IMPLANT
MATRIX PURAPLY MZ 1000MG (Tissue) IMPLANT
NDL HYPO 25GX1X1/2 BEV (NEEDLE) ×1 IMPLANT
NEEDLE HYPO 25GX1X1/2 BEV (NEEDLE) ×2 IMPLANT
NS IRRIG 1000ML POUR BTL (IV SOLUTION) ×2 IMPLANT
PACK ORTHO EXTREMITY (CUSTOM PROCEDURE TRAY) ×2 IMPLANT
PAD ARMBOARD 7.5X6 YLW CONV (MISCELLANEOUS) ×2 IMPLANT
PURAPLY MZ 1000MG (Tissue) ×2 IMPLANT
SOL PREP POV-IOD 4OZ 10% (MISCELLANEOUS) ×2 IMPLANT
STAPLER VISISTAT 35W (STAPLE) ×1 IMPLANT
SUT ETHILON 3 0 PS 1 (SUTURE) ×2 IMPLANT
SUT VIC AB 3-0 FS2 27 (SUTURE) ×1 IMPLANT
SYR CONTROL 10ML LL (SYRINGE) ×2 IMPLANT
TOWEL GREEN STERILE (TOWEL DISPOSABLE) ×2 IMPLANT
TUBE CONNECTING 12X1/4 (SUCTIONS) ×2 IMPLANT
YANKAUER SUCT BULB TIP NO VENT (SUCTIONS) ×2 IMPLANT

## 2021-04-20 NOTE — Progress Notes (Signed)
PROGRESS NOTE  Zachary [name] CXK:[id number] DOB: 1945-05-06 DOA: 04/18/2021 PCP: [name], FNP  HPI/Recap of past 24 hours: Zachary [name] is a 76 y.o. male with medical history significant of T2DM, HLD, HTN, PVD, BPH who presented to [facility] ED with complaints of bilateral leg pain. He had  aorto-peripheral angiogram on 04/14/21 by IR. After the procedure he reportedly started to have pain in his legs.  Described as stabbing. Pain is constant and tylenol does help. He denies any bleeding or drainage from procedure site. He also has had right lower back pain for the past few days as well.  Work-up revealed osteomyelitis of both proximal and distal phalanx of the great toe seen on MRI 04/18/2021.   Seen by podiatry plan for surgery 04/20/2021.  Hospital course complicated by MSSA bacteremia, POA for which infectious disease was consulted, patient switched to cefazolin on 04/19/2021.  2D echo was ordered.  Repeated blood cultures x2 peripherally negative to date.  Also complicated by E. coli UTI, POA.  04/20/2021: Patient was seen and examined at bedside this morning.  He has no new complaints.  States he feels better, however legs are achy.  Updated his son via phone.     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 osteomyelitis of rigth great toe    Hyponatremia   Osteomyelitis (HCC)  Sepsis secondary to osteomyelitis of rigth great toe, MSSA bacteremia, E. coli UTI- (present on admission) 76 year old male presenting with worsening bilateral leg pain and cough with known gangrenous right toe found to be septic with white count of 23 K, tachycardia and tachypnea source osteomyelitis. Initially  given vanc/cefepime and flagyl>changed to IV vanc/rocephin and azithromycin to cover for osteo, then switched to cefazolin on 04/19/2021 with MSSA bacteremia. Repeated blood cultures x2 peripherally on 04/19/2021, negative to date. UA was positive on admission for pyuria.  Urine culture grew greater than 100,000 colonies of E. coli, sensitivities are pending. Continue to follow-up with repeated blood cultures.  Newly diagnosed MSSA bacteremia, POA ID following Switched to cefazolin on 04/19/2021 Follow-up TTE to rule out infective endocarditis. Follow-up repeated blood cultures taken peripherally on 04/20/2011.  Thus far negative to date.  E. coli UTI, POA UA positive for pyuria on admission Urine culture obtained on 04/18/2021 positive for greater than 100,000 colonies of E. coli Sensitivities are pending  Osteomyelitis of great toe of right foot ([place])- (present on admission) status post right great toe amputation by podiatry [name]. -Appreciate specialists assistance. Pain control with bowel regimen Gentle IV fluid hydration.  AKI on CKD 3B, suspect prerenal in the setting of dehydration Appears to be back to his baseline creatinine 1.5 with GFR 45. Presented with creatinine of 1.81 with GFR of 30 Continue to avoid nephrotoxic agents, dehydration and hypotension Continue gentle IV fluid hydration LR 50 cc/h x 2 days Monitor urine output with strict I's and O's  Anion gap metabolic acidosis likely secondary to renal insufficiency. Anion gap 15, serum bicarb 16 Continue gentle IV fluid hydration if no improvement may consider p.o. sodium bicarbonate Repeat renal function in the   Type 2 diabetes mellitus with hyperglycemia ([place])- (present on admission) Hemoglobin A1c 8.5 on  04/20/2021. Continue insulin sliding scale. Continue Semglee 20 units daily.   Peripheral vascular disease ([place])- (present on admission) Recent angiogram to right leg on 04/14/21 by IR, no signs of infection at  incision site Has had progressive pain in both legs, Dr. Rogers Blocker called IR, Dr. Annamaria Boots, and discussed and if no infection to incision site not contributing to sepsis.  Holding plavix/ASA until no longer needed surgery.   Improving hyponatremia- (present on admission) Continue gentle hydration and check urine sodium    Essential hypertension- (present on admission) BP is currently at goal Continue home oral antihypertensive Continue to closely monitor vital signs IV antihypertensive as needed with parameters.   Hyperlipidemia- (present on admission) Patient refuses to take statin   DVT prophylaxis:  SCDs/defer pharmacological DVT prophylaxis to podiatry. Code Status:  DNR - confirmed with patient Family Communication: Updated patient's son via phone on 04/20/2021.  Disposition Plan:  The patient is from: home             Anticipated d/c is to: wen podiatry signs off.                  Patient is currently: Pending further management by podiatry. Consults called: Podiatry. Admission status: Inpatient.  Inpatient status.  Patient requires at least 2 midnights for further evaluation and treatment of present condition.      Status is: Inpatient        Objective: Vitals:   04/20/21 1109 04/20/21 1124 04/20/21 1139 04/20/21 1159  BP: (!) 112/56 116/66 (!) 119/58 (!) 123/56  Pulse: 67 71 72 71  Resp: 17 17 12 18   Temp: 97.8 F (36.6 C)  98.2 F (36.8 C) 97.9 F (36.6 C)  TempSrc:      SpO2: 97% 98% 96% 96%  Weight:      Height:        Intake/Output Summary (Last 24 hours) at 04/20/2021 1505 Last data filed at 04/20/2021 1218 Gross per 24 hour  Intake 1773.68 ml  Output 810 ml  Net 963.68 ml   Filed Weights   04/20/21 0411 04/20/21 0944  Weight: 86.2 kg 86.2 kg    Exam:  General: 76 y.o. year-old male well-developed well-nourished in no acute distress.  He is alert and interactive. Cardiovascular: Regular rate and rhythm with no rubs or gallops. Respiratory:  Clear auscultation with no wheezes or rales.  Good respiratory effort.   Abdomen: Soft nontender bowel sounds present.   Musculoskeletal: No lower extremity edema bilaterally.   Skin: Right great toe in surgical dressing. Psychiatry: Mood is appropriate for condition and setting.   Data Reviewed: CBC: Recent Labs  Lab 04/14/21 0724 04/18/21 1635 04/19/21 0347 04/20/21 0640  WBC 14.3* 23.6* 25.3* 15.4*  NEUTROABS  --  21.9*  --   --   HGB 15.7 14.7 13.9 12.8*  HCT 45.9 41.1 39.7 36.6*  MCV 85.3 82.5 83.9 84.3  PLT 217 297 260 884   Basic Metabolic Panel: Recent Labs  Lab 04/14/21 0724 04/18/21 1635 04/19/21 0347 04/20/21 0640  NA 129* 125* 125* 128*  K 4.6 4.0 4.3 3.6  CL 95* 91* 94* 97*  CO2 22 19* 16* 23  GLUCOSE 240* 323* 400* 248*  BUN 21 32* 35* 38*  CREATININE 1.93* 1.65* 1.81* 1.57*  CALCIUM 8.8* 8.3* 7.9* 7.5*  MG  --   --   --  2.2  PHOS  --   --   --  2.9   GFR: Estimated Creatinine Clearance: 42.1 mL/min (  A) (by C-G formula based on SCr of 1.57 mg/dL (H)). Liver Function Tests: Recent Labs  Lab 04/18/21 1635 04/20/21 0640  AST 38 22  ALT 39 29  ALKPHOS 105 78  BILITOT 1.0 0.4  PROT 6.7 5.3*  ALBUMIN 2.5* 1.9*   No results for input(s): LIPASE, AMYLASE in the last 168 hours. No results for input(s): AMMONIA in the last 168 hours. Coagulation Profile: Recent Labs  Lab 04/14/21 0724 04/18/21 1635  INR 1.0 1.1   Cardiac Enzymes: No results for input(s): CKTOTAL, CKMB, CKMBINDEX, TROPONINI in the last 168 hours. BNP (last 3 results) No results for input(s): PROBNP in the last 8760 hours. HbA1C: Recent Labs    04/20/21 0640  HGBA1C 8.5*   CBG: Recent Labs  Lab 04/19/21 2033 04/20/21 0726 04/20/21 0947 04/20/21 1109 04/20/21 1221  GLUCAP 255* 237* 248* 217* 205*   Lipid Profile: No results for input(s): CHOL, HDL, LDLCALC, TRIG, CHOLHDL, LDLDIRECT in the last 72 hours. Thyroid Function Tests: Recent Labs    04/18/21 2009  TSH  1.263   Anemia Panel: No results for input(s): VITAMINB12, FOLATE, FERRITIN, TIBC, IRON, RETICCTPCT in the last 72 hours. Urine analysis:    Component Value Date/Time   COLORURINE YELLOW 04/18/2021 1635   APPEARANCEUR CLOUDY (A) 04/18/2021 1635   LABSPEC 1.025 04/18/2021 1635   PHURINE 5.5 04/18/2021 1635   GLUCOSEU >=500 (A) 04/18/2021 1635   HGBUR LARGE (A) 04/18/2021 1635   BILIRUBINUR NEGATIVE 04/18/2021 1635   KETONESUR NEGATIVE 04/18/2021 1635   PROTEINUR 100 (A) 04/18/2021 1635   NITRITE NEGATIVE 04/18/2021 1635   LEUKOCYTESUR SMALL (A) 04/18/2021 1635   Sepsis Labs: @LABRCNTIP (procalcitonin:4,lacticidven:4)  ) Recent Results (from the past 240 hour(s))  Resp Panel by RT-PCR (Flu A&B, Covid) Nasopharyngeal Swab     Status: None   Collection Time: 04/18/21  4:35 PM   Specimen: Nasopharyngeal Swab; Nasopharyngeal(NP) swabs in vial transport medium  Result Value Ref Range Status   SARS Coronavirus 2 by RT PCR NEGATIVE NEGATIVE Final    Comment: (NOTE) SARS-CoV-2 target nucleic acids are NOT DETECTED.  The SARS-CoV-2 RNA is generally detectable in upper respiratory specimens during the acute phase of infection. The lowest concentration of SARS-CoV-2 viral copies this assay can detect is 138 copies/mL. A negative result does not preclude SARS-Cov-2 infection and should not be used as the sole basis for treatment or other patient management decisions. A negative result may occur with  improper specimen collection/handling, submission of specimen other than nasopharyngeal swab, presence of viral mutation(s) within the areas targeted by this assay, and inadequate number of viral copies(<138 copies/mL). A negative result must be combined with clinical observations, patient history, and epidemiological information. The expected result is Negative.  Fact Sheet for Patients:  EntrepreneurPulse.com.au  Fact Sheet for Healthcare Providers:   IncredibleEmployment.be  This test is no t yet approved or cleared by the [place] FDA and  has been authorized for detection and/or diagnosis of SARS-CoV-2 by FDA under an Emergency Use Authorization (EUA). This EUA will remain  in effect (meaning this test can be used) for the duration of the COVID-19 declaration under Section 564(b)(1) of the Act, 21 U.S.C.section 360bbb-3(b)(1), unless the authorization is terminated  or revoked sooner.       Influenza A by PCR NEGATIVE NEGATIVE Final   Influenza B by PCR NEGATIVE NEGATIVE Final    Comment: (NOTE) The Xpert Xpress SARS-CoV-2/FLU/RSV plus assay is intended as an aid in the diagnosis of influenza from Nasopharyngeal  swab specimens and should not be used as a sole basis for treatment. Nasal washings and aspirates are unacceptable for Xpert Xpress SARS-CoV-2/FLU/RSV testing.  Fact Sheet for Patients: EntrepreneurPulse.com.au  Fact Sheet for Healthcare Providers: IncredibleEmployment.be  This test is not yet approved or cleared by the [place] FDA and has been authorized for detection and/or diagnosis of SARS-CoV-2 by FDA under an Emergency Use Authorization (EUA). This EUA will remain in effect (meaning this test can be used) for the duration of the COVID-19 declaration under Section 564(b)(1) of the Act, 21 U.S.C. section 360bbb-3(b)(1), unless the authorization is terminated or revoked.  Performed at [facility], [place] [address], [place], [place] [address]   Urine Culture     Status: Abnormal (Preliminary result)   Collection Time: 04/18/21  4:35 PM   Specimen: In/Out Cath Urine  Result Value Ref Range Status   Specimen Description IN/OUT CATH URINE  Final   Special Requests NONE  Final   Culture (A)  Final    >=100,000 COLONIES/mL ESCHERICHIA COLI IDENTIFICATION AND SUSCEPTIBILITIES TO FOLLOW Performed at [facility], [place] [address],  [place], [place] [address]    Report Status PENDING  Incomplete  Blood Culture (routine x 2)     Status: Abnormal (Preliminary result)   Collection Time: 04/18/21  4:40 PM   Specimen: BLOOD RIGHT FOREARM  Result Value Ref Range Status   Specimen Description BLOOD RIGHT FOREARM  Final   Special Requests   Final    BOTTLES DRAWN AEROBIC AND ANAEROBIC Blood Culture results may not be optimal due to an excessive volume of blood received in culture bottles   Culture  Setup Time   Final    GRAM POSITIVE COCCI IN CLUSTERS IN BOTH AEROBIC AND ANAEROBIC BOTTLES CRITICAL VALUE NOTED.  VALUE IS CONSISTENT WITH PREVIOUSLY REPORTED AND CALLED VALUE.    Culture (A)  Final    STAPHYLOCOCCUS AUREUS CULTURE REINCUBATED FOR BETTER GROWTH Performed at [facility], [place] [address], [place], [place] [address]    Report Status PENDING  Incomplete  Blood Culture (routine x 2)     Status: Abnormal (Preliminary result)   Collection Time: 04/18/21  5:10 PM   Specimen: BLOOD RIGHT HAND  Result Value Ref Range Status   Specimen Description BLOOD RIGHT HAND  Final   Special Requests   Final    BOTTLES DRAWN AEROBIC AND ANAEROBIC Blood Culture results may not be optimal due to an inadequate volume of blood received in culture bottles   Culture  Setup Time   Final    GRAM POSITIVE COCCI IN CLUSTERS IN BOTH AEROBIC AND ANAEROBIC BOTTLES CRITICAL RESULT CALLED TO, READ BACK BY AND VERIFIED WITH: PHARMD C JACKSON [id number] AT 22 BY CM    Culture (A)  Final    STAPHYLOCOCCUS AUREUS SUSCEPTIBILITIES TO FOLLOW Performed at [facility], [place] [address], [place], [place] [address]    Report Status PENDING  Incomplete  Blood Culture ID Panel (Reflexed)     Status: Abnormal   Collection Time: 04/18/21  5:10 PM  Result Value Ref Range Status   Enterococcus faecalis NOT DETECTED NOT DETECTED Final   Enterococcus Faecium NOT DETECTED NOT DETECTED Final   Listeria monocytogenes NOT DETECTED NOT DETECTED Final    Staphylococcus species DETECTED (A) NOT DETECTED Final    Comment: CRITICAL RESULT CALLED TO, READ BACK BY AND VERIFIED WITH: PHARMD C JACKSON [id number] AT 1037 BY CM    Staphylococcus aureus (  BCID) DETECTED (A) NOT DETECTED Final    Comment: CRITICAL RESULT CALLED TO, READ BACK BY AND VERIFIED WITH: PHARMD C JACKSON [id number] AT 1037 BY CM    Staphylococcus epidermidis NOT DETECTED NOT DETECTED Final   Staphylococcus lugdunensis NOT DETECTED NOT DETECTED Final   Streptococcus species NOT DETECTED NOT DETECTED Final   Streptococcus agalactiae NOT DETECTED NOT DETECTED Final   Streptococcus pneumoniae NOT DETECTED NOT DETECTED Final   Streptococcus pyogenes NOT DETECTED NOT DETECTED Final   A.calcoaceticus-baumannii NOT DETECTED NOT DETECTED Final   Bacteroides fragilis NOT DETECTED NOT DETECTED Final   Enterobacterales NOT DETECTED NOT DETECTED Final   Enterobacter cloacae complex NOT DETECTED NOT DETECTED Final   Escherichia coli NOT DETECTED NOT DETECTED Final   Klebsiella aerogenes NOT DETECTED NOT DETECTED Final   Klebsiella oxytoca NOT DETECTED NOT DETECTED Final   Klebsiella pneumoniae NOT DETECTED NOT DETECTED Final   Proteus species NOT DETECTED NOT DETECTED Final   Salmonella species NOT DETECTED NOT DETECTED Final   Serratia marcescens NOT DETECTED NOT DETECTED Final   Haemophilus influenzae NOT DETECTED NOT DETECTED Final   Neisseria meningitidis NOT DETECTED NOT DETECTED Final   Pseudomonas aeruginosa NOT DETECTED NOT DETECTED Final   Stenotrophomonas maltophilia NOT DETECTED NOT DETECTED Final   Candida albicans NOT DETECTED NOT DETECTED Final   Candida auris NOT DETECTED NOT DETECTED Final   Candida glabrata NOT DETECTED NOT DETECTED Final   Candida krusei NOT DETECTED NOT DETECTED Final   Candida parapsilosis NOT DETECTED NOT DETECTED Final   Candida tropicalis NOT DETECTED NOT DETECTED Final   Cryptococcus neoformans/gattii NOT DETECTED NOT DETECTED Final   Meth  resistant mecA/C and MREJ NOT DETECTED NOT DETECTED Final    Comment: Performed at [facility], [address], [place], [place] [address]  Respiratory (~20 pathogens) panel by PCR     Status: None   Collection Time: 04/18/21  8:09 PM   Specimen: Nasopharyngeal Swab; Respiratory  Result Value Ref Range Status   Adenovirus NOT DETECTED NOT DETECTED Final   Coronavirus 229E NOT DETECTED NOT DETECTED Final    Comment: (NOTE) The Coronavirus on the Respiratory Panel, DOES NOT test for the novel  Coronavirus (2019 nCoV)    Coronavirus HKU1 NOT DETECTED NOT DETECTED Final   Coronavirus NL63 NOT DETECTED NOT DETECTED Final   Coronavirus OC43 NOT DETECTED NOT DETECTED Final   Metapneumovirus NOT DETECTED NOT DETECTED Final   Rhinovirus / Enterovirus NOT DETECTED NOT DETECTED Final   Influenza A NOT DETECTED NOT DETECTED Final   Influenza B NOT DETECTED NOT DETECTED Final   Parainfluenza Virus 1 NOT DETECTED NOT DETECTED Final   Parainfluenza Virus 2 NOT DETECTED NOT DETECTED Final   Parainfluenza Virus 3 NOT DETECTED NOT DETECTED Final   Parainfluenza Virus 4 NOT DETECTED NOT DETECTED Final   Respiratory Syncytial Virus NOT DETECTED NOT DETECTED Final   Bordetella pertussis NOT DETECTED NOT DETECTED Final   Bordetella Parapertussis NOT DETECTED NOT DETECTED Final   Chlamydophila pneumoniae NOT DETECTED NOT DETECTED Final   Mycoplasma pneumoniae NOT DETECTED NOT DETECTED Final    Comment: Performed at [facility], [place]. [address] [place], [place] [address]  MRSA Next Gen by PCR, Nasal     Status: None   Collection Time: 04/19/21  7:00 AM   Specimen: Nasal Mucosa; Nasal Swab  Result Value Ref Range Status   MRSA by PCR Next Gen NOT DETECTED NOT DETECTED Final    Comment: (NOTE) The  GeneXpert MRSA Assay (FDA approved for NASAL specimens only), is one component of a comprehensive MRSA colonization surveillance program. It is not intended to diagnose MRSA infection nor to  guide or monitor treatment for MRSA infections. Test performance is not FDA approved in patients less than 73 years old. Performed at [facility], [place] [address], [place], [place] [address]   Culture, blood (routine x 2)     Status: None (Preliminary result)   Collection Time: 04/19/21  8:41 PM   Specimen: BLOOD RIGHT HAND  Result Value Ref Range Status   Specimen Description BLOOD RIGHT HAND  Final   Special Requests   Final    BOTTLES DRAWN AEROBIC AND ANAEROBIC Blood Culture adequate volume   Culture   Final    NO GROWTH < 12 HOURS Performed at [facility], [place] [address], [place], [place] [address]    Report Status PENDING  Incomplete  Culture, blood (routine x 2)     Status: None (Preliminary result)   Collection Time: 04/19/21  8:41 PM   Specimen: BLOOD  Result Value Ref Range Status   Specimen Description BLOOD LEFT ARM  Final   Special Requests   Final    BOTTLES DRAWN AEROBIC AND ANAEROBIC Blood Culture adequate volume   Culture   Final    NO GROWTH < 12 HOURS Performed at [facility], [place] [address], [place], [place] [address]    Report Status PENDING  Incomplete  Surgical pcr screen     Status: None   Collection Time: 04/20/21  5:42 AM   Specimen: Nasal Mucosa; Nasal Swab  Result Value Ref Range Status   MRSA, PCR NEGATIVE NEGATIVE Final   Staphylococcus aureus NEGATIVE NEGATIVE Final    Comment: (NOTE) The Xpert SA Assay (FDA approved for NASAL specimens in patients 73 years of age and older), is one component of a comprehensive surveillance program. It is not intended to diagnose infection nor to guide or monitor treatment. Performed at [facility], [place] [address] [place], [place] [address]   Aerobic/Anaerobic Culture w Gram Stain (surgical/deep wound)     Status: None (Preliminary result)   Collection Time: 04/20/21 10:44 AM   Specimen: Toe, Right; Amputation  Result Value Ref Range Status   Specimen Description TOE  Final    Special Requests RIGHT TOE  Final   Gram Stain   Final    RARE WBC PRESENT, PREDOMINANTLY MONONUCLEAR MODERATE GRAM POSITIVE COCCI RARE GRAM NEGATIVE RODS Performed at [facility], [place] [address], [place],  [address]    Culture PENDING  Incomplete   Report Status PENDING  Incomplete      Studies: DG Foot 2 Views Right  Result Date: 04/20/2021 CLINICAL DATA:  Postop right foot, amputation of the first digit. EXAM: RIGHT FOOT - 2 VIEW COMPARISON:  MRI examination dated April 18, 2021 FINDINGS: Postsurgical changes for recent first dated amputation through the metatarsophalangeal joint. Subcutaneous soft tissue edema and surgical clips in the surgical bed. Prior amputation of the second digit through the metatarsophalangeal joint. Prominent plantar calcaneal spurring. Talonavicular and tibiotalar joint arthropathy with deformity of the talus and navicular bone. 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 Achilles enthesopathy. IMPRESSION: 1. Postsurgical changes of amputation through the first metatarsophalangeal joint. 2.  Additional chronic findings as detailed above. Electronically Signed   By: Keane Police D.O.   On: 04/20/2021 11:42    Scheduled Meds:  diltiazem  120 mg  Oral Daily   insulin aspart       insulin aspart  0-9 Units Subcutaneous TID WC   insulin glargine-yfgn  20 Units Subcutaneous Daily    Continuous Infusions:   ceFAZolin (ANCEF) IV 2 g (04/20/21 0559)   lactated ringers 50 mL/hr at 04/20/21 0147     LOS: 1 day     Kayleen Memos, MD [facility] Pager [phone]  If 7PM-7AM, please contact night-coverage [email or web address] Password [email or web address] 04/20/2021, 3:05 PM

## 2021-04-20 NOTE — Interval H&P Note (Signed)
History and Physical Interval Note:  04/20/2021 10:16 AM  Zachary [name]  has presented today for surgery, with the diagnosis of Gangrene, osteomyelitis.  The various methods of treatment have been discussed with the patient and family. After consideration of risks, benefits and other options for treatment, the patient has consented to  Procedure(s): AMPUTATION TOE (Right) as a surgical intervention.  The patient's history has been reviewed, patient examined, no change in status, stable for surgery.  I have reviewed the patient's chart and labs.  Questions were answered to the patient's satisfaction.    Patient seen in pre-op and discussed surgical plan. Discussed proceeding for source control, but this may experience delayed healing. Patient aware and agrees to proceed. Will follow post-operatively.  [name]

## 2021-04-20 NOTE — Anesthesia Preprocedure Evaluation (Addendum)
Anesthesia Evaluation  Patient identified by MRN, date of birth, ID band Patient awake    Reviewed: Allergy & Precautions, NPO status , Patient's Chart, lab work & pertinent test results  Airway Mallampati: I  TM Distance: >3 FB Neck ROM: Full    Dental  (+) Dental Advisory Given, Edentulous Upper, Edentulous Lower   Pulmonary    breath sounds clear to auscultation       Cardiovascular hypertension, Pt. on medications + Peripheral Vascular Disease   Rhythm:Regular Rate:Normal     Neuro/Psych  Headaches, negative psych ROS   GI/Hepatic negative GI ROS, Neg liver ROS,   Endo/Other  diabetes, Type 2, Insulin Dependent  Renal/GU      Musculoskeletal negative musculoskeletal ROS (+)   Abdominal Normal abdominal exam  (+)   Peds  Hematology negative hematology ROS (+)   Anesthesia Other Findings   Reproductive/Obstetrics                            Anesthesia Physical Anesthesia Plan  ASA: 2  Anesthesia Plan: MAC   Post-op Pain Management:    Induction: Intravenous  PONV Risk Score and Plan: 2 and Ondansetron and Propofol infusion  Airway Management Planned: Natural Airway and Simple Face Mask  Additional Equipment: None  Intra-op Plan:   Post-operative Plan:   Informed Consent: I have reviewed the patients History and Physical, chart, labs and discussed the procedure including the risks, benefits and alternatives for the proposed anesthesia with the patient or authorized representative who has indicated his/her understanding and acceptance.   Patient has DNR.  Discussed DNR with patient and Continue DNR.     Plan Discussed with: CRNA  Anesthesia Plan Comments:        Anesthesia Quick Evaluation

## 2021-04-20 NOTE — Anesthesia Postprocedure Evaluation (Signed)
Anesthesia Post Note  Patient: Zachary [name]  Procedure(s) Performed: AMPUTATION GREAT TOE; APPLICATION OF SKIN GRAFT SUBSTITUTE (Right: Toe)     Patient location during evaluation: PACU Anesthesia Type: MAC Level of consciousness: awake and alert Pain management: pain level controlled Vital Signs Assessment: post-procedure vital signs reviewed and stable Respiratory status: spontaneous breathing, nonlabored ventilation, respiratory function stable and patient connected to nasal cannula oxygen Cardiovascular status: stable and blood pressure returned to baseline Postop Assessment: no apparent nausea or vomiting Anesthetic complications: no   No notable events documented.  Last Vitals:  Vitals:   04/20/21 1139 04/20/21 1159  BP: (!) 119/58 (!) 123/56  Pulse: 72 71  Resp: 12 18  Temp: 36.8 C 36.6 C  SpO2: 96% 96%    Last Pain:  Vitals:   04/20/21 1139  TempSrc:   PainSc: Asleep                 Effie Berkshire

## 2021-04-20 NOTE — Op Note (Signed)
°  Patient Name: Zachary [name] DOB: [birth year]  MRN: [id number]   Date of Surgery: 04/20/21  Surgeon: Dr. Hardie Pulley, [name] Assistants: none  Pre-operative Diagnosis:  Gangrene right great toe, osteomyelitis Post-operative Diagnosis:  same Procedures:  1) Amputation right great toe - MPJ level  2) Application of skin graft substitute Pathology/Specimens: ID Type Source Tests Collected by Time Destination  1 : right great toe Amputation Toe, Right SURGICAL PATHOLOGY [name], [name] 04/20/2021 1039   A : right great toe culture Amputation Toe, Right CULTURE, FUNGUS WITHOUT SMEAR, AEROBIC/ANAEROBIC CULTURE W GRAM STAIN (SURGICAL/DEEP WOUND) [name], [name] 04/20/2021 1040    Anesthesia: MAC/local Hemostasis: * No tourniquets in log * Estimated Blood Loss: 10 mL Materials: Puraply MZ powder Medications: 10 ccs 0.5% marcaine plain, 1g vancomycin Complications: none  Indications for Procedure:  This is a 76 y.o. male with a right great toe infection with ostoemyelitis and signs of gangrene. After discussion of options of surgery and risks, benefits, and alternatives he agrees to proceed with elective correction   Procedure in Detail: Patient was identified in pre-operative holding area. Formal consent was signed and the right lower extremity was marked. Patient was brought back to the operating room. Anesthesia was induced. The extremity was prepped and draped in the usual sterile fashion. Timeout was taken to confirm patient name, laterality, and procedure prior to incision.   Attention was then directed to the right great toe where there was a necrotic eschar with scant purulence. A racquet shaped incision was made with the arm of the racquet medially. Brisk bleeding was noted. Dissection was carried down to level of bone.  Dissection was continued to the metatarsophalangeal joint and all collateral ligaments were freed at the joint. The medial bone that was necrotic appearing  was biopsied with a rongeur for microbiology. The bone soft tissue attachments of the proximal phalanx were removed and passed for pathology.  The remaining metatarsal head appeared healthy and viable.  The area was copiously irrigated.    Vancomycin was mixed with Puraply MZ powder and a bit of saline to form a putty. This was placed over the wound to promote healing and prevent infection. The skin edges were undermined, and deep closure was performed with 3-0 vicryl. The skin was reapproximated with 4-0 nylon and skin staples. The remainder of the vancomycin/Puraply slurry was applied topically.   The foot was then dressed with xeroform, 4x4, kerlix, and ACE bandage. Patient tolerated the procedure well.   Disposition: Following a period of post-operative monitoring, patient will be transferred to the floor. He did have brisk bleeding during the procedure which is a good prognostic indicator for healing. We will monitor the wound - should this be slow to heal or not heal he would benefit from vascular intervention, but will hold off for now.

## 2021-04-20 NOTE — Transfer of Care (Signed)
Immediate Anesthesia Transfer of Care Note  Patient: Zachary [name]  Procedure(s) Performed: AMPUTATION GREAT TOE; APPLICATION OF SKIN GRAFT SUBSTITUTE (Right: Toe)  Patient Location: PACU  Anesthesia Type:MAC  Level of Consciousness: awake, alert  and oriented  Airway & Oxygen Therapy: Patient Spontanous Breathing  Post-op Assessment: Report given to RN, Post -op Vital signs reviewed and stable, Patient moving all extremities X 4 and Patient able to stick tongue midline  Post vital signs: stable    Last Vitals:  Vitals Value Taken Time  BP 112/56 04/20/21 1109  Temp 36.6 C 04/20/21 1109  Pulse 68 04/20/21 1109  Resp 13 04/20/21 1109  SpO2 98 % 04/20/21 1109  Vitals shown include unvalidated device data.  Last Pain:  Vitals:   04/20/21 0954  TempSrc:   PainSc: 0-No pain         Complications: No notable events documented.

## 2021-04-20 NOTE — Progress Notes (Signed)
Per Dr. Nevada Crane, [place] for pt to travel to OR without telemetry.

## 2021-04-21 ENCOUNTER — Encounter (HOSPITAL_COMMUNITY): Payer: Self-pay | Admitting: Podiatry

## 2021-04-21 DIAGNOSIS — L97512 Non-pressure chronic ulcer of other part of right foot with fat layer exposed: Secondary | ICD-10-CM

## 2021-04-21 DIAGNOSIS — N400 Enlarged prostate without lower urinary tract symptoms: Secondary | ICD-10-CM

## 2021-04-21 DIAGNOSIS — N390 Urinary tract infection, site not specified: Secondary | ICD-10-CM | POA: Diagnosis present

## 2021-04-21 DIAGNOSIS — B9620 Unspecified Escherichia coli [E. coli] as the cause of diseases classified elsewhere: Secondary | ICD-10-CM | POA: Diagnosis present

## 2021-04-21 DIAGNOSIS — B9561 Methicillin susceptible Staphylococcus aureus infection as the cause of diseases classified elsewhere: Secondary | ICD-10-CM | POA: Diagnosis not present

## 2021-04-21 DIAGNOSIS — R7881 Bacteremia: Secondary | ICD-10-CM | POA: Diagnosis not present

## 2021-04-21 DIAGNOSIS — E11621 Type 2 diabetes mellitus with foot ulcer: Secondary | ICD-10-CM

## 2021-04-21 LAB — CULTURE, BLOOD (ROUTINE X 2)

## 2021-04-21 LAB — OSMOLALITY, URINE: Osmolality, Ur: 621 mOsm/kg (ref 300–900)

## 2021-04-21 LAB — CBC
HCT: 36.8 % — ABNORMAL LOW (ref 39.0–52.0)
Hemoglobin: 12.7 g/dL — ABNORMAL LOW (ref 13.0–17.0)
MCH: 29.3 pg (ref 26.0–34.0)
MCHC: 34.5 g/dL (ref 30.0–36.0)
MCV: 85 fL (ref 80.0–100.0)
Platelets: 269 10*3/uL (ref 150–400)
RBC: 4.33 MIL/uL (ref 4.22–5.81)
RDW: 12.3 % (ref 11.5–15.5)
WBC: 12.8 10*3/uL — ABNORMAL HIGH (ref 4.0–10.5)
nRBC: 0 % (ref 0.0–0.2)

## 2021-04-21 LAB — URINE CULTURE: Culture: >=100000 — AB

## 2021-04-21 LAB — COMPREHENSIVE METABOLIC PANEL
ALT: 17 U/L (ref 0–44)
AST: 19 U/L (ref 15–41)
Albumin: 1.9 g/dL — ABNORMAL LOW (ref 3.5–5.0)
Alkaline Phosphatase: 70 U/L (ref 38–126)
Anion gap: 9 (ref 5–15)
BUN: 33 mg/dL — ABNORMAL HIGH (ref 8–23)
CO2: 21 mmol/L — ABNORMAL LOW (ref 22–32)
Calcium: 7.6 mg/dL — ABNORMAL LOW (ref 8.9–10.3)
Chloride: 96 mmol/L — ABNORMAL LOW (ref 98–111)
Creatinine, Ser: 1.53 mg/dL — ABNORMAL HIGH (ref 0.61–1.24)
GFR, Estimated: 47 mL/min — ABNORMAL LOW (ref >60)
Glucose, Bld: 203 mg/dL — ABNORMAL HIGH (ref 70–99)
Potassium: 3.9 mmol/L (ref 3.5–5.1)
Sodium: 126 mmol/L — ABNORMAL LOW (ref 135–145)
Total Bilirubin: 0.6 mg/dL (ref 0.3–1.2)
Total Protein: 5.3 g/dL — ABNORMAL LOW (ref 6.5–8.1)

## 2021-04-21 LAB — SODIUM, URINE, RANDOM: Sodium, Ur: 11 mmol/L

## 2021-04-21 LAB — GLUCOSE, CAPILLARY
Glucose-Capillary: 229 mg/dL — ABNORMAL HIGH (ref 70–99)
Glucose-Capillary: 261 mg/dL — ABNORMAL HIGH (ref 70–99)
Glucose-Capillary: 215 mg/dL — ABNORMAL HIGH (ref 70–99)
Glucose-Capillary: 215 mg/dL — ABNORMAL HIGH (ref 70–99)

## 2021-04-21 LAB — OSMOLALITY: Osmolality: 287 mOsm/kg (ref 275–295)

## 2021-04-21 MED ORDER — BISACODYL 10 MG RE SUPP
10.0000 mg | Freq: Once | RECTAL | Status: AC
  Administered 2021-04-21: 10 mg via RECTAL
  Filled 2021-04-21: qty 1

## 2021-04-21 MED ORDER — SODIUM CHLORIDE 0.9 % IV SOLN: INTRAVENOUS | Status: DC | PRN

## 2021-04-21 MED ORDER — POLYETHYLENE GLYCOL 3350 17 G PO PACK
17.0000 g | PACK | Freq: Every day | ORAL | Status: DC
  Administered 2021-04-21 – 2021-04-25 (×5): 17 g via ORAL
  Filled 2021-04-21 (×6): qty 1

## 2021-04-21 NOTE — Progress Notes (Signed)
Progress Note   Patient: Zachary [name] FAO:[id number] DOB: [birth year] DOA: 04/18/2021     2 DOS: the patient was seen and examined on 04/21/2021   Brief hospital course: Patient is a 76 year old male with a history of type 2 diabetes mellitus, HTN, HLD, PVD, BPH presented with bilateral leg pain.He had  aorto-peripheral angiogram on 04/14/21 by IR. After the procedure he reportedly started to have pain in his legs. No bleeding or drainage from procedure site. He also has had right lower back pain for the past few days as well.  Work-up revealed osteomyelitis of both proximal and distal phalanx of the great toe seen on MRI 04/18/2021.   Underwent right great toe amputation on 1/10, podiatry following  Hospital course complicated by MSSA bacteremia, E. coli UTI.  ID consulted  Continue IV cefazolin.  Assessment and Plan * MSSA bacteremia - Hospital course complicated by MSSA bacteremia, POA -ID consulted.  Switched to cefazolin on 04/19/2021, will follow recommendations for disposition. -2D echo showed EF 40 to 45%, no vegetation  Osteomyelitis of great toe of right foot ([place])- (present on admission) - Underwent amputation of the right great toe, application of skin graft substitute on 1/10, postop day #1 -Podiatry following, will change dressing tomorrow a.m. antiplatelet recs.  - Will follow infectious disease recommendation regarding antibiotics.  Peripheral vascular disease ([place])- (present on admission) -Recent angiogram to right leg on 04/14/21 by IR, no signs of infection at incision site -Has had progressive pain in both legs, Dr Rogers Blocker had discussed with IR, Dr. Annamaria Boots, -Holding plavix/ASA until cleared by podiatry to resume -Refuses statin.  Outpatient follow-up with interventional radiology.  E. coli UTI- (present on admission) UA positive for pyuria, urine culture on 1/8 showed more than 100,000 colonies of E. Coli. Currently on IV cefazolin, ID following.  Hyponatremia- (present on  admission) - Corrected sodium 128 with hyperglycemia  -Obtain serum osmolality, urine osmolality, UNa -I's and O's with 4.7 L positive, hold off on IV fluids  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 ([facility])- (present on admission) - Uncontrolled with CKD, hyperglycemia, hemoglobin A1c 8.5 on 1/10  -Continue sliding scale insulin, Semglee 20 units daily   Stage 3 chronic kidney disease ([place])- (present on admission) - CKD stage IIIb, baseline1.4-1.8. -Also had anion gap metabolic acidosis likely due CKD.  Patient was placed on IV gentle hydration.  Gap closed. -Creatinine currently within baseline range  Essential hypertension- (present on admission) - Continue hydralazine IV as needed with parameters, continue Cardizem  Hyperlipidemia- (present on admission) -Refuses to take statin  sepsis secondary to pneumonia vs. osteomyelitis of rigth great toe - (present on admission) - Met sepsis criteria at the time of admission with leukocytosis, tachycardia, tachypnea, source gangrenous right toe with osteomyelitis.  - -initially given vanc/cefepime and flagyl, changeds to vanc/rocephin and Zithromax to cover for osteo-, then transition to IV cefazolin on 04/19/2021 for MSSA bacteremia -Underwent right great toe amputation on 1/10, podiatry following -ID consulted for assistance with antibiotics.     Subjective: Complaining of constipation, pain in the right foot.  Otherwise no acute issues overnight.  No fevers or chills.  Objective Vital signs were reviewed and unremarkable.  BP 128/60 (BP Location: Left Arm)    Pulse 88    Temp (!) 97.5 F (36.4 C) (Oral)    Resp 18    Ht _0  (1.727 m)    Wt 86.2 kg    SpO2 96%    BMI 28.89 kg/m  Physical Exam General: Alert and oriented x 3, NAD Cardiovascular: S1 S2 clear, RRR.  Respiratory: CTAB, no wheezing, rales or rhonchi Gastrointestinal: Soft, nontender, nondistended, NBS Ext: right foot dressing intact Psych: Normal  affect and demeanor, alert and oriented x3    Data Reviewed:  My review of labs, imaging, notes and other tests shows no new significant findings.   Family Communication: Discussed with patient  Disposition: Status is: Inpatient  Remains inpatient appropriate because: Not yet medically ready for discharge per podiatry, awaiting ID recommendation regarding antibiotics for disposition     Time spent: 35 minutes  Author: Estill Cotta, MD 04/21/2021 1:51 PM  For on call review [email or web address].

## 2021-04-21 NOTE — Plan of Care (Signed)
  Problem: Education: Goal: Knowledge of General Education information will improve Description Including pain rating scale, medication(s)/side effects and non-pharmacologic comfort measures Outcome: Progressing   

## 2021-04-21 NOTE — Progress Notes (Signed)
[name]  Date of Admission:  04/18/2021     Total days of antibiotics 4         ASSESSMENT:  Mr. Traynham is POD #1 from right great toe amputation and application of skin graft.  TTE without evidence of endocarditis. Surgical specimen with gram stain showing gram positive cocci and gram negative rods and culture re-incubated for better growth. Blood cultures from 1/9 without growth to date. Anticipate will requires 6 weeks of antibiotics. No indication for TEE at this time. Post-surgical care per Podiatry and remaining medical and supportive care per primary team.   PLAN:  Continue Cefazolin Monitor blood cultures for clearance of bacteremia.  Hold PICC placement pending clearance of blood cultures.  Monitor surgical specimen for organism identification with changes of antibiotics as appropriate.  Post-surgical wound care per Podiatry Remaining medical and supportive care per primary team.   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 osteomyelitis of rigth great toe    Hyponatremia   E. coli UTI    diltiazem  120 mg Oral Daily   insulin aspart  0-9 Units Subcutaneous TID WC   insulin glargine-yfgn  20 Units Subcutaneous Daily   polyethylene glycol  17 g Oral Daily   senna-docusate  2 tablet Oral Daily    SUBJECTIVE:  Afebrile overnight with no acute events.  Allergies  Allergen Reactions   Metformin Hcl Other (See Comments)    Unknown reaction   Pravastatin Sodium Other (See Comments)    Muscle pain      Review of Systems: Review of Systems  Constitutional:  Negative for chills, fever and weight loss.  Respiratory:  Negative for cough, shortness of breath  and wheezing.   Cardiovascular:  Negative for chest pain and leg swelling.  Gastrointestinal:  Negative for abdominal pain, constipation, diarrhea, nausea and vomiting.  Skin:  Negative for rash.     OBJECTIVE: Vitals:   04/20/21 1655 04/20/21 2052 04/21/21 0447 04/21/21 0810  BP: 115/75 125/69 121/74 128/60  Pulse: 82 79 78 88  Resp: 17 19 17 18   Temp: 98.4 F (36.9 C) 97.7 F (36.5 C) 97.7 F (36.5 C) (!) 97.5 F (36.4 C)  TempSrc: Oral   Oral  SpO2: 94% 93% 92% 96%  Weight:      Height:       Body mass index is 28.89 kg/m.  Physical Exam Constitutional:      General: He is not in acute distress.    Appearance: He is well-developed.  Cardiovascular:     Rate and Rhythm: Normal rate and regular rhythm.     Heart sounds: Normal heart sounds.  Pulmonary:     Effort: Pulmonary effort is normal.     Breath sounds: Normal breath sounds.  Skin:    General: Skin is warm and dry.  Neurological:     Mental Status: He is alert and oriented to person, place, and time.  Psychiatric:        Behavior: Behavior normal.        Thought Content: Thought content normal.        Judgment: Judgment normal.    Lab Results Lab Results  Component Value Date  WBC 12.8 (H) 04/21/2021   HGB 12.7 (L) 04/21/2021   HCT 36.8 (L) 04/21/2021   MCV 85.0 04/21/2021   PLT 269 04/21/2021    Lab Results  Component Value Date   CREATININE 1.53 (H) 04/21/2021   BUN 33 (H) 04/21/2021   NA 126 (L) 04/21/2021   K 3.9 04/21/2021   CL 96 (L) 04/21/2021   CO2 21 (L) 04/21/2021    Lab Results  Component Value Date   ALT 17 04/21/2021   AST 19 04/21/2021   ALKPHOS 70 04/21/2021   BILITOT 0.6 04/21/2021     Microbiology: Recent Results (from the past 240 hour(s))  Resp Panel by RT-PCR (Flu A&B, Covid) Nasopharyngeal Swab     Status: None   Collection Time: 04/18/21  4:35 PM   Specimen: Nasopharyngeal Swab; Nasopharyngeal(NP) swabs in vial transport medium  Result Value Ref Range Status    SARS Coronavirus 2 by RT PCR NEGATIVE NEGATIVE Final    Comment: (NOTE) SARS-CoV-2 target nucleic acids are NOT DETECTED.  The SARS-CoV-2 RNA is generally detectable in upper respiratory specimens during the acute phase of infection. The lowest concentration of SARS-CoV-2 viral copies this assay can detect is 138 copies/mL. A negative result does not preclude SARS-Cov-2 infection and should not be used as the sole basis for treatment or other patient management decisions. A negative result may occur with  improper specimen collection/handling, submission of specimen other than nasopharyngeal swab, presence of viral mutation(s) within the areas targeted by this assay, and inadequate number of viral copies(<138 copies/mL). A negative result must be combined with clinical observations, patient history, and epidemiological information. The expected result is Negative.  Fact Sheet for Patients:  EntrepreneurPulse.com.au  Fact Sheet for Healthcare Providers:  IncredibleEmployment.be  This test is no t yet approved or cleared by the [place] FDA and  has been authorized for detection and/or diagnosis of SARS-CoV-2 by FDA under an Emergency Use Authorization (EUA). This EUA will remain  in effect (meaning this test can be used) for the duration of the COVID-19 declaration under Section 564(b)(1) of the Act, 21 U.S.C.section 360bbb-3(b)(1), unless the authorization is terminated  or revoked sooner.       Influenza A by PCR NEGATIVE NEGATIVE Final   Influenza B by PCR NEGATIVE NEGATIVE Final    Comment: (NOTE) The Xpert Xpress SARS-CoV-2/FLU/RSV plus assay is intended as an aid in the diagnosis of influenza from Nasopharyngeal swab specimens and should not be used as a sole basis for treatment. Nasal washings and aspirates are unacceptable for Xpert Xpress SARS-CoV-2/FLU/RSV testing.  Fact Sheet for  Patients: EntrepreneurPulse.com.au  Fact Sheet for Healthcare Providers: IncredibleEmployment.be  This test is not yet approved or cleared by the [place] FDA and has been authorized for detection and/or diagnosis of SARS-CoV-2 by FDA under an Emergency Use Authorization (EUA). This EUA will remain in effect (meaning this test can be used) for the duration of the COVID-19 declaration under Section 564(b)(1) of the Act, 21 U.S.C. section 360bbb-3(b)(1), unless the authorization is terminated or revoked.  Performed at [facility], [place] [address], [place], [place] [address]   Urine Culture     Status: Abnormal   Collection Time: 04/18/21  4:35 PM   Specimen: In/Out Cath Urine  Result Value Ref Range Status   Specimen Description IN/OUT CATH URINE  Final   Special Requests   Final    NONE Performed at [facility], [place] [address], [place], [place] [address]  Culture >=100,000 COLONIES/mL ESCHERICHIA COLI (A)  Final   Report Status 04/21/2021 FINAL  Final   Organism ID, Bacteria ESCHERICHIA COLI (A)  Final      Susceptibility   Escherichia coli - MIC*    AMPICILLIN >=32 RESISTANT Resistant     CEFAZOLIN >=64 RESISTANT Resistant     CEFEPIME <=0.12 SENSITIVE Sensitive     CEFTRIAXONE <=0.25 SENSITIVE Sensitive     CIPROFLOXACIN <=0.25 SENSITIVE Sensitive     GENTAMICIN <=1 SENSITIVE Sensitive     IMIPENEM <=0.25 SENSITIVE Sensitive     NITROFURANTOIN <=16 SENSITIVE Sensitive     TRIMETH/SULFA <=20 SENSITIVE Sensitive     AMPICILLIN/SULBACTAM >=32 RESISTANT Resistant     PIP/TAZO <=4 SENSITIVE Sensitive     * >=100,000 COLONIES/mL ESCHERICHIA COLI  Blood Culture (routine x 2)     Status: Abnormal   Collection Time: 04/18/21  4:40 PM   Specimen: BLOOD RIGHT FOREARM  Result Value Ref Range Status   Specimen Description BLOOD RIGHT FOREARM  Final   Special Requests   Final    BOTTLES DRAWN AEROBIC AND ANAEROBIC Blood  Culture results may not be optimal due to an excessive volume of blood received in culture bottles   Culture  Setup Time   Final    GRAM POSITIVE COCCI IN CLUSTERS IN BOTH AEROBIC AND ANAEROBIC BOTTLES CRITICAL VALUE NOTED.  VALUE IS CONSISTENT WITH PREVIOUSLY REPORTED AND CALLED VALUE.    Culture (A)  Final    STAPHYLOCOCCUS AUREUS SUSCEPTIBILITIES PERFORMED ON PREVIOUS CULTURE WITHIN THE LAST 5 DAYS. Performed at [facility], [place] [address] [place], [place] [address]    Report Status 04/21/2021 FINAL  Final  Blood Culture (routine x 2)     Status: Abnormal   Collection Time: 04/18/21  5:10 PM   Specimen: BLOOD RIGHT HAND  Result Value Ref Range Status   Specimen Description BLOOD RIGHT HAND  Final   Special Requests   Final    BOTTLES DRAWN AEROBIC AND ANAEROBIC Blood Culture results may not be optimal due to an inadequate volume of blood received in culture bottles   Culture  Setup Time   Final    GRAM POSITIVE COCCI IN CLUSTERS IN BOTH AEROBIC AND ANAEROBIC BOTTLES CRITICAL RESULT CALLED TO, READ BACK BY AND VERIFIED WITH: Carla Drape [id number] AT 74 BY CM Performed at [facility], [place] [address], [place], [place] [address]    Culture STAPHYLOCOCCUS AUREUS (A)  Final   Report Status 04/21/2021 FINAL  Final   Organism ID, Bacteria STAPHYLOCOCCUS AUREUS  Final      Susceptibility   Staphylococcus aureus - MIC*    CIPROFLOXACIN <=0.5 SENSITIVE Sensitive     ERYTHROMYCIN <=0.25 SENSITIVE Sensitive     GENTAMICIN <=0.5 SENSITIVE Sensitive     OXACILLIN <=0.25 SENSITIVE Sensitive     TETRACYCLINE <=1 SENSITIVE Sensitive     VANCOMYCIN <=0.5 SENSITIVE Sensitive     TRIMETH/SULFA <=10 SENSITIVE Sensitive     CLINDAMYCIN <=0.25 SENSITIVE Sensitive     RIFAMPIN <=0.5 SENSITIVE Sensitive     Inducible Clindamycin NEGATIVE Sensitive     * STAPHYLOCOCCUS AUREUS  Blood Culture ID Panel (Reflexed)     Status: Abnormal   Collection Time: 04/18/21  5:10 PM  Result  Value Ref Range Status   Enterococcus faecalis NOT DETECTED NOT DETECTED Final   Enterococcus Faecium NOT DETECTED NOT DETECTED Final   Listeria monocytogenes NOT DETECTED NOT DETECTED Final   Staphylococcus species DETECTED (  A) NOT DETECTED Final    Comment: CRITICAL RESULT CALLED TO, READ BACK BY AND VERIFIED WITH: PHARMD C JACKSON [id number] AT 1037 BY CM    Staphylococcus aureus (BCID) DETECTED (A) NOT DETECTED Final    Comment: CRITICAL RESULT CALLED TO, READ BACK BY AND VERIFIED WITH: PHARMD C JACKSON [id number] AT 1037 BY CM    Staphylococcus epidermidis NOT DETECTED NOT DETECTED Final   Staphylococcus lugdunensis NOT DETECTED NOT DETECTED Final   Streptococcus species NOT DETECTED NOT DETECTED Final   Streptococcus agalactiae NOT DETECTED NOT DETECTED Final   Streptococcus pneumoniae NOT DETECTED NOT DETECTED Final   Streptococcus pyogenes NOT DETECTED NOT DETECTED Final   A.calcoaceticus-baumannii NOT DETECTED NOT DETECTED Final   Bacteroides fragilis NOT DETECTED NOT DETECTED Final   Enterobacterales NOT DETECTED NOT DETECTED Final   Enterobacter cloacae complex NOT DETECTED NOT DETECTED Final   Escherichia coli NOT DETECTED NOT DETECTED Final   Klebsiella aerogenes NOT DETECTED NOT DETECTED Final   Klebsiella oxytoca NOT DETECTED NOT DETECTED Final   Klebsiella pneumoniae NOT DETECTED NOT DETECTED Final   Proteus species NOT DETECTED NOT DETECTED Final   Salmonella species NOT DETECTED NOT DETECTED Final   Serratia marcescens NOT DETECTED NOT DETECTED Final   Haemophilus influenzae NOT DETECTED NOT DETECTED Final   Neisseria meningitidis NOT DETECTED NOT DETECTED Final   Pseudomonas aeruginosa NOT DETECTED NOT DETECTED Final   Stenotrophomonas maltophilia NOT DETECTED NOT DETECTED Final   Candida albicans NOT DETECTED NOT DETECTED Final   Candida auris NOT DETECTED NOT DETECTED Final   Candida glabrata NOT DETECTED NOT DETECTED Final   Candida krusei NOT DETECTED NOT DETECTED  Final   Candida parapsilosis NOT DETECTED NOT DETECTED Final   Candida tropicalis NOT DETECTED NOT DETECTED Final   Cryptococcus neoformans/gattii NOT DETECTED NOT DETECTED Final   Meth resistant mecA/C and MREJ NOT DETECTED NOT DETECTED Final    Comment: Performed at [facility], [address], [place], [place] [address]  Respiratory (~20 pathogens) panel by PCR     Status: None   Collection Time: 04/18/21  8:09 PM   Specimen: Nasopharyngeal Swab; Respiratory  Result Value Ref Range Status   Adenovirus NOT DETECTED NOT DETECTED Final   Coronavirus 229E NOT DETECTED NOT DETECTED Final    Comment: (NOTE) The Coronavirus on the Respiratory Panel, DOES NOT test for the novel  Coronavirus (2019 nCoV)    Coronavirus HKU1 NOT DETECTED NOT DETECTED Final   Coronavirus NL63 NOT DETECTED NOT DETECTED Final   Coronavirus OC43 NOT DETECTED NOT DETECTED Final   Metapneumovirus NOT DETECTED NOT DETECTED Final   Rhinovirus / Enterovirus NOT DETECTED NOT DETECTED Final   Influenza A NOT DETECTED NOT DETECTED Final   Influenza B NOT DETECTED NOT DETECTED Final   Parainfluenza Virus 1 NOT DETECTED NOT DETECTED Final   Parainfluenza Virus 2 NOT DETECTED NOT DETECTED Final   Parainfluenza Virus 3 NOT DETECTED NOT DETECTED Final   Parainfluenza Virus 4 NOT DETECTED NOT DETECTED Final   Respiratory Syncytial Virus NOT DETECTED NOT DETECTED Final   Bordetella pertussis NOT DETECTED NOT DETECTED Final   Bordetella Parapertussis NOT DETECTED NOT DETECTED Final   Chlamydophila pneumoniae NOT DETECTED NOT DETECTED Final   Mycoplasma pneumoniae NOT DETECTED NOT DETECTED Final    Comment: Performed at [facility], [place]. [address] [place], [place] [address]  MRSA Next Gen by PCR, Nasal     Status: None   Collection Time: 04/19/21  7:00 AM  Specimen: Nasal Mucosa; Nasal Swab  Result Value Ref Range Status   MRSA by PCR Next Gen NOT DETECTED NOT DETECTED Final    Comment: (NOTE) The GeneXpert  MRSA Assay (FDA approved for NASAL specimens only), is one component of a comprehensive MRSA colonization surveillance program. It is not intended to diagnose MRSA infection nor to guide or monitor treatment for MRSA infections. Test performance is not FDA approved in patients less than 63 years old. Performed at [facility], [place] [address] [place], [place] [address]   Culture, blood (routine x 2)     Status: None (Preliminary result)   Collection Time: 04/19/21  8:41 PM   Specimen: BLOOD RIGHT HAND  Result Value Ref Range Status   Specimen Description BLOOD RIGHT HAND  Final   Special Requests   Final    BOTTLES DRAWN AEROBIC AND ANAEROBIC Blood Culture adequate volume   Culture   Final    NO GROWTH 2 DAYS Performed at [facility], [place] [address], [place], [place] [address]    Report Status PENDING  Incomplete  Culture, blood (routine x 2)     Status: None (Preliminary result)   Collection Time: 04/19/21  8:41 PM   Specimen: BLOOD  Result Value Ref Range Status   Specimen Description BLOOD LEFT ARM  Final   Special Requests   Final    BOTTLES DRAWN AEROBIC AND ANAEROBIC Blood Culture adequate volume   Culture   Final    NO GROWTH 2 DAYS Performed at [facility], [place] [address] [place], [place] [address]    Report Status PENDING  Incomplete  Surgical pcr screen     Status: None   Collection Time: 04/20/21  5:42 AM   Specimen: Nasal Mucosa; Nasal Swab  Result Value Ref Range Status   MRSA, PCR NEGATIVE NEGATIVE Final   Staphylococcus aureus NEGATIVE NEGATIVE Final    Comment: (NOTE) The Xpert SA Assay (FDA approved for NASAL specimens in patients 75 years of age and older), is one component of a comprehensive surveillance program. It is not intended to diagnose infection nor to guide or monitor treatment. Performed at [facility], [place] [address], [place], [place] [address]   Culture, fungus without smear     Status: None (Preliminary result)    Collection Time: 04/20/21 10:44 AM   Specimen: Toe, Right; Amputation  Result Value Ref Range Status   Specimen Description TOE  Final   Special Requests RIGHT TOE  Final   Culture   Final    NO FUNGUS ISOLATED AFTER 1 DAY Performed at [facility], [address] [place], [place] [address]    Report Status PENDING  Incomplete  Aerobic/Anaerobic Culture w Gram Stain (surgical/deep wound)     Status: None (Preliminary result)   Collection Time: 04/20/21 10:44 AM   Specimen: Toe, Right; Amputation  Result Value Ref Range Status   Specimen Description TOE  Final   Special Requests RIGHT TOE  Final   Gram Stain   Final    RARE WBC PRESENT, PREDOMINANTLY MONONUCLEAR MODERATE GRAM POSITIVE COCCI RARE GRAM NEGATIVE RODS    Culture   Final    CULTURE REINCUBATED FOR BETTER GROWTH Performed at [facility], [place] [address] [place], [place] [address]    Report Status PENDING  Incomplete     Terri Piedra, [place] [facility]  04/21/2021  3:53 PM

## 2021-04-21 NOTE — Evaluation (Signed)
Occupational Therapy Evaluation Patient Details Name: Zachary [name] MRN: [id number] DOB: March 27, 1946 Today's Date: 04/21/2021   History of Present Illness Pt is a 76 yo male admitted for osteomyelitis of the R great toe. Pt underwent amputation of great toe on 1/10 w skin graft. Pt with MSSA bacteremia. PMH:  DM2, HLD, HTN, PVD, and L great toe amputation.   Clinical Impression   Pt admitted with the above diagnosis and has the deficits outlined below. Pt would benefit from cont OT to increase independence with basic adls and adl transfers so he can dc home safely with his wife.  Son lives close and second son visiting through the weekend.  Unsure of weight bearing status at this time.  Nursing has reached out to MD so treated pt as NWB RLE for this session.  Will continue to see acutely focusing on more activity in standing.        Recommendations for follow up therapy are one component of a multi-disciplinary discharge planning process, led by the attending physician.  Recommendations may be updated based on patient status, additional functional criteria and insurance authorization.   Follow Up Recommendations  Home health OT    Assistance Recommended at Discharge Frequent or constant Supervision/Assistance  Patient can return home with the following A little help with bathing/dressing/bathroom;Assistance with cooking/housework;Help with stairs or ramp for entrance    Functional Status Assessment  Patient has had a recent decline in their functional status and demonstrates the ability to make significant improvements in function in a reasonable and predictable amount of time.  Equipment Recommendations  None recommended by OT    Recommendations for Other Services       Precautions / Restrictions Precautions Precautions: Other (comment) (unsure.  Spoke to nursing who paged MD.) Precaution Comments: Kept pt NWB until confirmed by MD Restrictions Weight Bearing Restrictions: Yes  (unsure. MD paged)      Mobility Bed Mobility Overal bed mobility: Needs Assistance Bed Mobility: Supine to Sit     Supine to sit: Min assist;HOB elevated     General bed mobility comments: cues given for pt to utilize bedrails and to do for himself instead of pulling on therapist to get up.    Transfers Overall transfer level: Needs assistance Equipment used: Rolling walker (2 wheels) Transfers: Bed to chair/wheelchair/BSC;Sit to/from Stand Sit to Stand: Max assist;From elevated surface   Squat pivot transfers: Mod assist       General transfer comment: Pt with diffilculty getting to full stand due to pain and weakness      Balance Overall balance assessment: Needs assistance Sitting-balance support: Feet supported Sitting balance-Leahy Scale: Good     Standing balance support: Reliant on assistive device for balance;Bilateral upper extremity supported Standing balance-Leahy Scale: Poor Standing balance comment: Pt heavily reliant on outside support                           ADL either performed or assessed with clinical judgement   ADL Overall ADL's : Needs assistance/impaired Eating/Feeding: Independent;Sitting   Grooming: Wash/dry hands;Wash/dry face;Oral care;Set up;Sitting   Upper Body Bathing: Set up;Sitting   Lower Body Bathing: Moderate assistance;Sit to/from stand;Cueing for compensatory techniques   Upper Body Dressing : Set up;Sitting   Lower Body Dressing: Sit to/from stand;Maximal assistance;Cueing for compensatory techniques Lower Body Dressing Details (indicate cue type and reason): Pt unable to let go of walker to manage clothing at this time. Toilet Transfer: Moderate assistance;Squat-pivot;BSC/3in1  Toileting- Clothing Manipulation and Hygiene: Maximal assistance;Sit to/from stand;Cueing for compensatory techniques       Functional mobility during ADLs: Moderate assistance;Rolling walker (2 wheels) General ADL Comments: Pt  limited due to pain and inability to stand fully.  Unsure of pt's weight bearing status at this time.  MD paged.  Until then, had pt NWB RLE.     Vision Baseline Vision/History: 1 Wears glasses Ability to See in Adequate Light: 0 Adequate Patient Visual Report: No change from baseline Vision Assessment?: No apparent visual deficits     Perception     Praxis      Pertinent Vitals/Pain Pain Assessment: 0-10 Pain Score: 6  Pain Location: R foot Pain Descriptors / Indicators: Aching;Discomfort;Grimacing;Operative site guarding Pain Intervention(s): Limited activity within patient's tolerance;Monitored during session;Patient requesting pain meds-RN notified;Repositioned     Hand Dominance Right   Extremity/Trunk Assessment Upper Extremity Assessment Upper Extremity Assessment: Overall WFL for tasks assessed   Lower Extremity Assessment Lower Extremity Assessment: Defer to PT evaluation   Cervical / Trunk Assessment Cervical / Trunk Assessment: Normal   Communication Communication Communication: No difficulties   Cognition Arousal/Alertness: Awake/alert Behavior During Therapy: WFL for tasks assessed/performed Overall Cognitive Status: Within Functional Limits for tasks assessed                                       General Comments  Pt most limited by pain and weight bearing status. Will continue to update as MD gives orders    Exercises     Shoulder Instructions      Home Living Family/patient expects to be discharged to:: Private residence Living Arrangements: Spouse/significant other Available Help at Discharge: Family;Available 24 hours/day Type of Home: House Home Access: Stairs to enter [employer] of Steps: 2 Entrance Stairs-Rails: Can reach both Home Layout: One level     Bathroom Shower/Tub: Walk-in shower;Door   [employer] Toilet: Standard     Home Equipment: International aid/development worker (2 wheels)   Additional  Comments: son in to assist for one week and other son lives close.      Prior Functioning/Environment Prior Level of Function : Independent/Modified Independent             Mobility Comments: Pt has walker but was not using it. Uses 3:1 commode to raise commode ADLs Comments: Was independent        OT Problem List: Decreased activity tolerance;Impaired balance (sitting and/or standing);Decreased knowledge of use of DME or AE;Decreased knowledge of precautions;Pain      OT Treatment/Interventions: Self-care/ADL training;DME and/or AE instruction;Therapeutic activities;Balance training    OT Goals(Current goals can be found in the care plan section) Acute Rehab OT Goals Patient Stated Goal: to get back on my feet OT Goal Formulation: With patient Time For Goal Achievement: 05/05/21 Potential to Achieve Goals: Good ADL Goals Pt Will Perform Lower Body Bathing: with supervision;sit to/from stand Pt Will Perform Lower Body Dressing: with supervision;sit to/from stand Pt Will Transfer to Toilet: with supervision;ambulating;bedside commode;regular height toilet  OT Frequency: Min 2X/week    Co-evaluation              AM-PAC OT "6 Clicks" Daily Activity     Outcome Measure Help from another person eating meals?: None Help from another person taking care of personal grooming?: None Help from another person toileting, which includes using toliet, bedpan, or urinal?: A Lot Help from another  person bathing (including washing, rinsing, drying)?: A Lot Help from another person to put on and taking off regular upper body clothing?: None Help from another person to put on and taking off regular lower body clothing?: A Lot 6 Click Score: 18   End of Session Equipment Utilized During Treatment: Gait belt;Rolling walker (2 wheels) Nurse Communication: Mobility status  Activity Tolerance: Patient limited by pain Patient left: in chair;with call bell/phone within reach  OT Visit  Diagnosis: Unsteadiness on feet (R26.81);Other abnormalities of gait and mobility (R26.89)                Time: 1610-9604 OT Time Calculation (min): 26 min Charges:  OT General Charges $OT Visit: 1 Visit OT Evaluation $OT Eval Moderate Complexity: 1 Mod OT Treatments $Self Care/Home Management : 8-22 mins  Glenford Peers 04/21/2021, 10:18 AM

## 2021-04-21 NOTE — Assessment & Plan Note (Addendum)
UA positive for pyuria, urine culture on 1/8 showed more than 100,000 colonies of E. Coli.

## 2021-04-21 NOTE — Progress Notes (Signed)
Orthopedic Tech Progress Note Patient Details:  Zachary [name] 1946-02-14 [id number]  Ortho Devices Type of Ortho Device: CAM walker Ortho Device/Splint Location: RLE Ortho Device/Splint Interventions: Ordered, Application, Adjustment   Post Interventions Patient Tolerated: Well Instructions Provided: Adjustment of device, Care of device  [place] 04/21/2021, 10:49 AM

## 2021-04-21 NOTE — Progress Notes (Signed)
Inpatient Diabetes Program Recommendations  AACE/ADA: New Consensus Statement on Inpatient Glycemic Control (2015)  Target Ranges:  Prepandial:   less than 140 mg/dL      Peak postprandial:   less than 180 mg/dL (1-2 hours)      Critically ill patients:  140 - 180 mg/dL    Latest Reference Range & Units 04/20/21 07:26 04/20/21 09:47 04/20/21 11:09 04/20/21 12:21 04/20/21 16:30 04/20/21 20:52  Glucose-Capillary 70 - 99 mg/dL 237 (H)  3 units Novolog @0918  248 (H)  5 units Novolog @1005  217 (H) 205 (H)  3 units Novolog  194 (H)  2 units Novolog  20 units Semglee 216 (H)    Latest Reference Range & Units 04/21/21 06:25  Glucose-Capillary 70 - 99 mg/dL 229 (H)  3 units Novolog @0811   20 units Semglee @0950        Home DM Meds: Soliqua 50 units Daily   Current Orders: Semglee 20 units daily      Novolog Sensitive Correction Scale/ SSI (0-9 units) TID AC     MD- Please consider:  1. Increase Semglee to 25 units daily  2. Start Novolog Meal Coverage: Novolog 3 units TID with meals Hold if pt eats <50% of meal, Hold if pt NPO      --Will follow patient during hospitalization--  Wyn Quaker RN, MSN, CDE Diabetes Coordinator Inpatient Glycemic Control Team Team Pager: [phone] (8a-5p)

## 2021-04-21 NOTE — Assessment & Plan Note (Addendum)
-   Hospital course complicated by MSSA bacteremia, POA -ID consulted.  Switched to cefazolin on 04/19/2021 -2D echo showed EF 40 to 45%, no vegetation. 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  Jerking movements much improved today.

## 2021-04-21 NOTE — Progress Notes (Signed)
°  Subjective:  Patient ID: Zachary [name], male    DOB: Oct 12, 1945,  MRN: [id number]  Seen bedside. Had some pain in the foot and in the back today.. Per patient did not work with PT today. Objective:   Vitals:   04/21/21 0810 04/21/21 1619  BP: 128/60 138/60  Pulse: 88 78  Resp: 18 18  Temp: (!) 97.5 F (36.4 C) 98.1 F (36.7 C)  SpO2: 96% 96%   General AA&O x3. Normal mood and affect.  Vascular Foot warm to touch  Neurologic Epicritic sensation grossly intact.  Dermatologic Wound well appearing. Warm and viable skin flaps. Dried blood along incision.     Orthopedic: +Motor to foot    Assessment & Plan:  Patient was evaluated and treated and all questions answered.  Gangrene, osteo right great toe s/p amputation -Well appearing, appears viable. Dressed with betadine and dry dressing. Can leave intact until outpatient f/u unless strikethrough noted. -Ok for d/c tomorrow from podiatry standpoint.  -Abx per ID for bacteremia -WBT in CAM Boot RLE. Surgical shoe left. -Please have patient f/u in the office with myself or Dr. Posey Pronto. No dressing changes needed. [phone]. Podiatry will sign off. Please contact with questions or concerns.  [name], [name]  Accessible via secure chat for questions or concerns.

## 2021-04-21 NOTE — Evaluation (Signed)
Physical Therapy Evaluation Patient Details Name: Zachary [name] MRN: [id number] DOB: Jul 15, 1945 Today's Date: 04/21/2021  History of Present Illness  Pt is a 76 yo male admitted for osteomyelitis of the R great toe. Pt underwent amputation of great toe on 1/10 w skin graft. Pt with MSSA bacteremia. PMH:  DM2, HLD, HTN, PVD, and L great toe amputation.  Clinical Impression  Podiatry cleared pt as RLE WBAT with CAM boot donned. Pt received up in the chair after working with OT. He overall presents with generalized weakness, decreased activity tolerance, decreased pain control, and impaired balance. Pt requiring heavy mod-max assist for multiple transfers out of chair and to bedside commode. He is unable to ambulate. Discussed with pt and pt family. Pt wife unable to provide this level of assist and pt son will not be available consistently. They may be interested in SNF, but would like to discuss as a family. If pt were to discharge home, would need below DME, PTAR transport home, and HHPT.      Recommendations for follow up therapy are one component of a multi-disciplinary discharge planning process, led by the attending physician.  Recommendations may be updated based on patient status, additional functional criteria and insurance authorization.  Follow Up Recommendations Skilled nursing-short term rehab (<3 hours/day)    Assistance Recommended at Discharge Frequent or constant Supervision/Assistance  Patient can return home with the following  A lot of help with walking and/or transfers;A lot of help with bathing/dressing/bathroom;Help with stairs or ramp for entrance;Assist for transportation    Equipment Recommendations BSC/3in1;Wheelchair (measurements PT);Wheelchair cushion (measurements PT)  Recommendations for Other Services       Functional Status Assessment Patient has had a recent decline in their functional status and demonstrates the ability to make significant improvements in  function in a reasonable and predictable amount of time.     Precautions / Restrictions Precautions Precautions: Fall Precaution Comments: Kept pt NWB until confirmed by MD Required Braces or Orthoses: Other Brace Other Brace: R CAM boot Restrictions Weight Bearing Restrictions: Yes RLE Weight Bearing: Weight bearing as tolerated Other Position/Activity Restrictions: WBAT with CAM boot      Mobility  Bed Mobility Overal bed mobility: Needs Assistance Bed Mobility: Supine to Sit     Supine to sit: Min assist;HOB elevated     General bed mobility comments: OOB in chair    Transfers Overall transfer level: Needs assistance Equipment used: Rolling walker (2 wheels) Transfers: Bed to chair/wheelchair/BSC;Sit to/from Stand Sit to Stand: Mod assist;Max assist Stand pivot transfers: Min assist   Squat pivot transfers: Mod assist     General transfer comment: Pt requiring maximal assist to stand on initial attempt, cues to scoot out to edge of chair, "nose over toes," glute/quad activation and transition of hands to walker. Pt with difficulty executing. Pt requiring heavy moderate assist on subsequent 2 attempts, pivoted over to [facility] on 3rd trial with minimal bilateral foot clearance and increased trunk flexion    Ambulation/Gait               General Gait Details: unable  Stairs            Wheelchair Mobility    Modified Rankin (Stroke Patients Only)       Balance Overall balance assessment: Needs assistance Sitting-balance support: Feet supported Sitting balance-Leahy Scale: Good     Standing balance support: Reliant on assistive device for balance;Bilateral upper extremity supported Standing balance-Leahy Scale: Poor Standing balance comment: Pt heavily reliant  on outside support                             Pertinent Vitals/Pain Pain Assessment: Faces Pain Score: 6  Faces Pain Scale: Hurts even more Pain Location: R foot, back Pain  Descriptors / Indicators: Aching;Discomfort;Grimacing;Operative site guarding Pain Intervention(s): Limited activity within patient's tolerance;Monitored during session;Patient requesting pain meds-RN notified    Home Living Family/patient expects to be discharged to:: Private residence Living Arrangements: Spouse/significant other Available Help at Discharge: Family;Available 24 hours/day Type of Home: House Home Access: Stairs to enter Entrance Stairs-Rails: Can reach both Entrance Stairs-Number of Steps: 2   Home Layout: One level Home Equipment: International aid/development worker (2 wheels) Additional Comments: son in to assist for one week and other son lives close.    Prior Function Prior Level of Function : Independent/Modified Independent             Mobility Comments: Pt has walker but was not using it. Uses 3:1 commode to raise commode ADLs Comments: Was independent     Hand Dominance   Dominant Hand: Right    Extremity/Trunk Assessment   Upper Extremity Assessment Upper Extremity Assessment: Defer to OT evaluation    Lower Extremity Assessment Lower Extremity Assessment: RLE deficits/detail;LLE deficits/detail RLE Deficits / Details: s/p R first toe amputation, grossly 3-/5 LLE Deficits / Details: Grossly 3/5    Cervical / Trunk Assessment Cervical / Trunk Assessment: Normal  Communication   Communication: No difficulties  Cognition Arousal/Alertness: Awake/alert Behavior During Therapy: WFL for tasks assessed/performed Overall Cognitive Status: Within Functional Limits for tasks assessed                                          General Comments General comments (skin integrity, edema, etc.): Pt most limited by pain and weight bearing status. Will continue to update as MD gives orders    Exercises General Exercises - Lower Extremity Long Arc Quad: Both;10 reps;Seated   Assessment/Plan    PT Assessment Patient needs continued  PT services  PT Problem List Decreased strength;Decreased balance;Decreased activity tolerance;Decreased mobility;Pain       PT Treatment Interventions DME instruction;Gait training;Stair training;Functional mobility training;Therapeutic activities;Therapeutic exercise;Balance training;Patient/family education    PT Goals (Current goals can be found in the Care Plan section)  Acute Rehab PT Goals Patient Stated Goal: go home PT Goal Formulation: With patient/family Time For Goal Achievement: 05/05/21 Potential to Achieve Goals: Good    Frequency Min 3X/week     Co-evaluation               AM-PAC PT "6 Clicks" Mobility  Outcome Measure Help needed turning from your back to your side while in a flat bed without using bedrails?: A Little Help needed moving from lying on your back to sitting on the side of a flat bed without using bedrails?: A Lot Help needed moving to and from a bed to a chair (including a wheelchair)?: A Lot Help needed standing up from a chair using your arms (e.g., wheelchair or bedside chair)?: A Lot Help needed to walk in hospital room?: Total Help needed climbing 3-5 steps with a railing? : Total 6 Click Score: 11    End of Session Equipment Utilized During Treatment: Gait belt;Other (comment) (CAM boot) Activity Tolerance: Patient limited by fatigue;Patient limited by pain Patient left:  in chair;with call bell/phone within reach;with family/visitor present Nurse Communication: Patient requests pain meds PT Visit Diagnosis: Unsteadiness on feet (R26.81);Muscle weakness (generalized) (M62.81);Difficulty in walking, not elsewhere classified (R26.2);Pain Pain - Right/Left: Right Pain - part of body: Ankle and joints of foot    Time: 5997-7414 PT Time Calculation (min) (ACUTE ONLY): 42 min   Charges:   PT Evaluation $PT Eval Moderate Complexity: 1 Mod PT Treatments $Therapeutic Activity: 23-37 mins        Wyona Almas, PT, DPT Acute  Rehabilitation Services Pager [phone] Office [phone]   Deno Etienne 04/21/2021, 1:07 PM

## 2021-04-21 NOTE — Progress Notes (Signed)
Went by to see patient - patient was working with PT at this time. Will defer changing dressing today and   Spoke with patient's son and wife in the waiting area and updated them on his status. From my perspective if the toe is well appearing tomorrow he will be ok for d/c. We can continue outpatient management for his LLE including vascular intervention.  Will change dressing tomorrow AM and provide updated reccs. Believed surgical cure of osteomyelitis. Defer to ID for Abx especially given MSSA bacteremia.  Please contact with questions or concerns.  [name], [name]

## 2021-04-21 NOTE — Hospital Course (Addendum)
Patient is a 76 year old male with a history of type 2 diabetes mellitus, HTN, HLD, PVD, BPH presented with bilateral leg pain.He had  aorto-peripheral angiogram on 04/14/21 by IR. After the procedure he reportedly started to have pain in his legs. No bleeding or drainage from procedure site. He also has had right lower back pain for the past few days as well.  Work-up revealed osteomyelitis of both proximal and distal phalanx of the great toe seen on MRI 04/18/2021.   Underwent right great toe amputation on 1/10, podiatry following  Hospital course complicated by MSSA bacteremia, E. coli UTI.  ID consulted Surgical cultures with E. 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  Neurosurgery consulted MRI left foot: Osteomyelitis of the left fourth distal phalanx, podiatry reconsulted 1/16: TEE completed: No endocarditis.  Patient noted to have right hand jerking movements.  Neurology consulted.  Cefepime discontinued, started on IV cefazolin and ciprofloxacin 1/17: Right hand jerking movements improving, complaining of severe constipation

## 2021-04-22 ENCOUNTER — Inpatient Hospital Stay: Payer: Self-pay

## 2021-04-22 ENCOUNTER — Encounter (HOSPITAL_COMMUNITY): Payer: Self-pay | Admitting: Podiatry

## 2021-04-22 ENCOUNTER — Inpatient Hospital Stay (HOSPITAL_COMMUNITY): Payer: Medicare PPO

## 2021-04-22 DIAGNOSIS — I10 Essential (primary) hypertension: Secondary | ICD-10-CM

## 2021-04-22 DIAGNOSIS — J189 Pneumonia, unspecified organism: Secondary | ICD-10-CM

## 2021-04-22 DIAGNOSIS — B9620 Unspecified Escherichia coli [E. coli] as the cause of diseases classified elsewhere: Secondary | ICD-10-CM

## 2021-04-22 DIAGNOSIS — N390 Urinary tract infection, site not specified: Secondary | ICD-10-CM

## 2021-04-22 DIAGNOSIS — R7881 Bacteremia: Secondary | ICD-10-CM | POA: Diagnosis not present

## 2021-04-22 DIAGNOSIS — B9561 Methicillin susceptible Staphylococcus aureus infection as the cause of diseases classified elsewhere: Secondary | ICD-10-CM | POA: Diagnosis not present

## 2021-04-22 LAB — COMPREHENSIVE METABOLIC PANEL
ALT: 9 U/L (ref 0–44)
AST: 19 U/L (ref 15–41)
Albumin: 1.9 g/dL — ABNORMAL LOW (ref 3.5–5.0)
Alkaline Phosphatase: 67 U/L (ref 38–126)
Anion gap: 9 (ref 5–15)
BUN: 24 mg/dL — ABNORMAL HIGH (ref 8–23)
CO2: 26 mmol/L (ref 22–32)
Calcium: 7.8 mg/dL — ABNORMAL LOW (ref 8.9–10.3)
Chloride: 97 mmol/L — ABNORMAL LOW (ref 98–111)
Creatinine, Ser: 1.41 mg/dL — ABNORMAL HIGH (ref 0.61–1.24)
GFR, Estimated: 51 mL/min — ABNORMAL LOW (ref >60)
Glucose, Bld: 235 mg/dL — ABNORMAL HIGH (ref 70–99)
Potassium: 3.6 mmol/L (ref 3.5–5.1)
Sodium: 132 mmol/L — ABNORMAL LOW (ref 135–145)
Total Bilirubin: 0.3 mg/dL (ref 0.3–1.2)
Total Protein: 5.3 g/dL — ABNORMAL LOW (ref 6.5–8.1)

## 2021-04-22 LAB — SURGICAL PATHOLOGY

## 2021-04-22 LAB — GLUCOSE, CAPILLARY
Glucose-Capillary: 127 mg/dL — ABNORMAL HIGH (ref 70–99)
Glucose-Capillary: 176 mg/dL — ABNORMAL HIGH (ref 70–99)
Glucose-Capillary: 207 mg/dL — ABNORMAL HIGH (ref 70–99)
Glucose-Capillary: 241 mg/dL — ABNORMAL HIGH (ref 70–99)
Glucose-Capillary: 246 mg/dL — ABNORMAL HIGH (ref 70–99)

## 2021-04-22 MED ORDER — CHLORHEXIDINE GLUCONATE CLOTH 2 % EX PADS
6.0000 | MEDICATED_PAD | Freq: Every day | CUTANEOUS | Status: DC
  Administered 2021-04-22 – 2021-04-29 (×8): 6 via TOPICAL

## 2021-04-22 MED ORDER — CIPROFLOXACIN HCL 500 MG PO TABS: 500.0000 mg | ORAL_TABLET | Freq: Two times a day (BID) | ORAL | Status: DC

## 2021-04-22 MED ORDER — INSULIN GLARGINE-YFGN 100 UNIT/ML ~~LOC~~ SOLN
24.0000 [IU] | Freq: Every day | SUBCUTANEOUS | Status: DC
  Administered 2021-04-23 – 2021-04-29 (×7): 24 [IU] via SUBCUTANEOUS
  Filled 2021-04-22 (×7): qty 0.24

## 2021-04-22 MED ORDER — SODIUM CHLORIDE 0.9% FLUSH
10.0000 mL | Freq: Two times a day (BID) | INTRAVENOUS | Status: DC
  Administered 2021-04-24 – 2021-04-27 (×4): 10 mL
  Administered 2021-04-28: 3 mL
  Administered 2021-04-28: 10 mL

## 2021-04-22 MED ORDER — SODIUM CHLORIDE 0.9 % IV SOLN
2.0000 g | Freq: Two times a day (BID) | INTRAVENOUS | Status: DC
  Administered 2021-04-22 – 2021-04-25 (×7): 2 g via INTRAVENOUS
  Filled 2021-04-22 (×7): qty 2

## 2021-04-22 MED ORDER — INSULIN ASPART 100 UNIT/ML IJ SOLN: 0.0000 [IU] | Freq: Every day | INTRAMUSCULAR | Status: DC

## 2021-04-22 MED ORDER — SODIUM CHLORIDE 0.9% FLUSH: 10.0000 mL | INTRAVENOUS | Status: DC | PRN

## 2021-04-22 MED ORDER — INSULIN ASPART 100 UNIT/ML IJ SOLN
0.0000 [IU] | Freq: Three times a day (TID) | INTRAMUSCULAR | Status: DC
  Administered 2021-04-22: 5 [IU] via SUBCUTANEOUS
  Administered 2021-04-23: 3 [IU] via SUBCUTANEOUS
  Administered 2021-04-23 (×2): 2 [IU] via SUBCUTANEOUS
  Administered 2021-04-24 – 2021-04-26 (×7): 3 [IU] via SUBCUTANEOUS
  Administered 2021-04-26: 2 [IU] via SUBCUTANEOUS
  Administered 2021-04-26: 3 [IU] via SUBCUTANEOUS
  Administered 2021-04-27: 2 [IU] via SUBCUTANEOUS
  Administered 2021-04-27: 3 [IU] via SUBCUTANEOUS
  Administered 2021-04-28: 2 [IU] via SUBCUTANEOUS
  Administered 2021-04-28 (×2): 3 [IU] via SUBCUTANEOUS
  Administered 2021-04-29: 2 [IU] via SUBCUTANEOUS

## 2021-04-22 NOTE — Progress Notes (Signed)
Occupational Therapy Treatment Patient Details Name: Zachary [name] MRN: [id number] DOB: [birth year] Today's Date: 04/22/2021   History of present illness Pt is a 76 yo male admitted for osteomyelitis of the R great toe. Pt underwent amputation of great toe on 1/10 w skin graft. Pt with MSSA bacteremia. PMH:  DM2, HLD, HTN, PVD, and L great toe amputation.   OT comments  OT treatment session with focus on self-care re-education, ADL transfers and short distance functional mobility in hospital room. Max A required to don footwear including L sock and R CAM boot. Patient then ambulated ~5-2ft with RW and Min A with cues for proximity to RW and for upright posture. Patient continues to be limited by deficits listed below and would benefit from continued acute OT services in prep for safe d/c to next level of care. Recommendation updated to SNF rehab given continued need for Mod A overall for ADLs and Min to Mod A overall for functional transfers with RW. OT will continue to follow acutely.    Recommendations for follow up therapy are one component of a multi-disciplinary discharge planning process, led by the attending physician.  Recommendations may be updated based on patient status, additional functional criteria and insurance authorization.    Follow Up Recommendations  Skilled nursing-short term rehab (<3 hours/day)    Assistance Recommended at Discharge Frequent or constant Supervision/Assistance  Patient can return home with the following  A little help with walking and/or transfers;A lot of help with bathing/dressing/bathroom   Equipment Recommendations  Other (comment) (Defer to next level of care.)    Recommendations for Other Services      Precautions / Restrictions Precautions Precautions: Fall Required Braces or Orthoses: Other Brace Other Brace: R CAM boot Restrictions Weight Bearing Restrictions: Yes RLE Weight Bearing: Weight bearing as tolerated Other Position/Activity  Restrictions: WBAT with CAM boot       Mobility Bed Mobility Overal bed mobility: Needs Assistance Bed Mobility: Supine to Sit     Supine to sit: Supervision;HOB elevated     General bed mobility comments: Supervision A with HOB elevated +rail. Requires increased time/effort.    Transfers Overall transfer level: Needs assistance Equipment used: Rolling walker (2 wheels) Transfers: Sit to/from Stand Sit to Stand: Mod assist;Min assist   Step pivot transfers: Min assist       General transfer comment: Mod A for sit to stand from EOB with cues for hand placement and inceased time/effort.     Balance Overall balance assessment: Needs assistance Sitting-balance support: No upper extremity supported;Feet supported Sitting balance-Leahy Scale: Good     Standing balance support: Bilateral upper extremity supported;Reliant on assistive device for balance Standing balance-Leahy Scale: Poor Standing balance comment: Reliant on BUE on RW.                           ADL either performed or assessed with clinical judgement   ADL Overall ADL's : Needs assistance/impaired                     Lower Body Dressing: Maximal assistance;Sit to/from stand Lower Body Dressing Details (indicate cue type and reason): Max A to doff/don L sock and to don R CAM boot. Unable to attain figure-4 position. Toilet Transfer: Minimal assistance;Stand-pivot;Rolling walker (2 wheels) Toilet Transfer Details (indicate cue type and reason): Simulated with stand-pivot to recliner on R with use of RW.  Extremity/Trunk Assessment              Vision       Perception     Praxis      Cognition Arousal/Alertness: Awake/alert Behavior During Therapy: WFL for tasks assessed/performed Overall Cognitive Status: Within Functional Limits for tasks assessed                                            Exercises     Shoulder Instructions        General Comments      Pertinent Vitals/ Pain       Pain Assessment: 0-10 Pain Score: 3  Pain Location: R foot Pain Descriptors / Indicators: Sore Pain Intervention(s): Limited activity within patient's tolerance;Monitored during session;Repositioned  Home Living                                          Prior Functioning/Environment              Frequency  Min 2X/week        Progress Toward Goals  OT Goals(current goals can now be found in the care plan section)  Progress towards OT goals: Progressing toward goals  Acute Rehab OT Goals Patient Stated Goal: To get better. OT Goal Formulation: With patient Time For Goal Achievement: 05/05/21 Potential to Achieve Goals: Good ADL Goals Pt Will Perform Lower Body Bathing: with supervision;sit to/from stand Pt Will Perform Lower Body Dressing: with supervision;sit to/from stand Pt Will Transfer to Toilet: with supervision;ambulating;bedside commode;regular height toilet  Plan Discharge plan needs to be updated;Frequency remains appropriate    Co-evaluation                 AM-PAC OT "6 Clicks" Daily Activity     Outcome Measure   Help from another person eating meals?: None Help from another person taking care of personal grooming?: None Help from another person toileting, which includes using toliet, bedpan, or urinal?: A Lot Help from another person bathing (including washing, rinsing, drying)?: A Lot Help from another person to put on and taking off regular upper body clothing?: None Help from another person to put on and taking off regular lower body clothing?: A Lot 6 Click Score: 18    End of Session Equipment Utilized During Treatment: Gait belt;Rolling walker (2 wheels)  OT Visit Diagnosis: Unsteadiness on feet (R26.81);Other abnormalities of gait and mobility (R26.89)   Activity Tolerance Patient limited by pain   Patient Left in chair;with call bell/phone within reach    Nurse Communication          Time: 1358-1410 OT Time Calculation (min): 12 min  Charges: OT General Charges $OT Visit: 1 Visit OT Treatments $Therapeutic Activity: 8-22 mins  Everlean Bucher H. OTR/L Supplemental OT, Department of rehab services [phone]  Ismael Karge R H. 04/22/2021, 2:17 PM

## 2021-04-22 NOTE — TOC Initial Note (Signed)
Transition of Care [facility]) - Initial/Assessment Note    Patient Details  Name: Zachary [name] MRN: [id number] Date of Birth: Aug 17, 1945  Transition of Care [facility] CM/SW Contact:    Milinda Antis, [place] Phone Number: 04/22/2021, 12:38 PM  Clinical Narrative:                 CSW received consult for possible SNF placement at time of discharge. CSW spoke with patient. Patient reported that patient's spouse is currently unable to care for patient at their home.  Patient expressed understanding of PT recommendation and is agreeable to SNF placement at time of discharge. Patient reports preference for a facility in [place]. CSW discussed insurance authorization process and will provide Medicare SNF ratings list. Patient has received 3 COVID vaccines. CSW will send out referrals for review.  No further questions reported at this time.   Skilled Nursing Rehab Facilities-   [email or web address]   Ratings out of 5 possible   Name Address  Phone # [place] Ins[place] Overall  [facility] [address], [place] 5 5 2 4   [facility]  [address] [place], [place] [phone] 2 5 5   [facility] [place], [place] 4 1 1 1   [facility] [place] 2 2 4 4   [facility] [address], [place] 2 1 1 1   [place] 3 1 4 3   [facility] [address] [place] 5 2 2 3   [facility] [address] [place] 4 1 2 1   [place] [facility] [place], [place] [phone] 5 1 2 2   [facility] [phone], [place] [phone] 4 1 1 1   [facility] [address] [place] [facility] [phone] 4 1 2 1   [place] [address] [place] [phone] 4 1 1 1       Expected  Discharge Plan: Skilled Nursing Facility Barriers to Discharge: Insurance Authorization, SNF Pending bed offer   Patient Goals and CMS Choice   CMS Medicare.gov Compare Post Acute Care list provided to:: Patient Choice offered to / list presented to : Patient  Expected Discharge Plan and Services Expected Discharge Plan: [place] arrangements for the past 2 months: Single Family Home                                      Prior Living Arrangements/Services Living arrangements for the past 2 months: Single Family Home Lives with:: Self, Spouse Patient language and need for interpreter reviewed:: Yes Do you feel safe going back to the place where you live?: Yes      Need for Family Participation in Patient Care: Yes (Comment) Care giver support system in place?: Yes (comment)   Criminal Activity/Legal Involvement Pertinent to Current Situation/Hospitalization: No - Comment as needed  Activities of Daily Living Home Assistive Devices/Equipment: None ADL Screening (condition at time of admission) Patient's cognitive ability adequate to safely complete daily activities?: Yes Is the patient deaf or have difficulty hearing?: No Does the patient have difficulty seeing, even when wearing glasses/contacts?: No Does the patient have difficulty concentrating, remembering, or making decisions?: No Patient able to express need for assistance with ADLs?: Yes Does the patient have difficulty dressing or  bathing?: No Independently performs ADLs?: Yes (appropriate for developmental age) Does the patient have difficulty walking or climbing stairs?: Yes Weakness of Legs: Both Weakness of Arms/Hands: None  Permission Sought/Granted   Permission granted to share information with : Yes, Verbal Permission Granted     Permission granted to share info w AGENCY: SNF        Emotional Assessment Appearance:: Appears older than stated  age Attitude/Demeanor/Rapport: Engaged Affect (typically observed): Pleasant, Adaptable Orientation: : Oriented to Self, Oriented to Place, Oriented to  Time, Oriented to Situation Alcohol / Substance Use: Not Applicable Psych Involvement: No (comment)  Admission diagnosis:  Bacteremia [R78.81] Dyspnea [R06.00] Osteomyelitis ([place]) [M86.9] Sepsis, due to unspecified organism, unspecified whether acute organ dysfunction present [facility]) [A41.9] Patient Active Problem List   Diagnosis Date Noted   E. coli UTI 04/21/2021   MSSA bacteremia 04/19/2021   Osteomyelitis of great toe of right foot ([place]) 04/18/2021   sepsis secondary to pneumonia vs. 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   Headache 03/24/2020   Hyperlipidemia, unspecified 03/24/2020   Hyperlipidemia 03/24/2020   Long term (current) use of insulin ([place]) 03/24/2020   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 [place] [address] Phone: [phone] Fax: [phone]  [place]  [phone] - [place], [place] [address] Phone: [phone] Fax: [phone]     Social Determinants of Health (SDOH) Interventions    Readmission Risk Interventions No flowsheet data found.

## 2021-04-22 NOTE — Progress Notes (Signed)
Peripherally Inserted Central Catheter Placement  The IV Nurse has discussed with the patient and/or persons authorized to consent for the patient, the purpose of this procedure and the potential benefits and risks involved with this procedure.  The benefits include less needle sticks, lab draws from the catheter, and the patient may be discharged home with the catheter. Risks include, but not limited to, infection, bleeding, blood clot (thrombus formation), and puncture of an artery; nerve damage and irregular heartbeat and possibility to perform a PICC exchange if needed/ordered by physician.  Alternatives to this procedure were also discussed.  Bard Power PICC patient education guide, fact sheet on infection prevention and patient information card has been provided to patient /or left at bedside.    PICC Placement Documentation  PICC Single Lumen 50/09/38 Right Basilic 40 cm 0 cm (Active)  Indication for Insertion or Continuance of Line Home intravenous therapies (PICC only) 04/22/21 1700  Exposed Catheter (cm) 0 cm 04/22/21 1700  Site Assessment Clean;Dry;Intact 04/22/21 1700  Line Status Flushed;[place]ine locked;Blood return noted 04/22/21 1700  Dressing Type Transparent;Securing device 04/22/21 1700  Dressing Status Clean;Dry;Intact 04/22/21 1700  Antimicrobial disc in place? Yes 04/22/21 1700  Safety Lock Not Applicable 18/29/93 7169  Dressing Change Due 04/29/21 04/22/21 [place] 04/22/2021, 5:15 PM

## 2021-04-22 NOTE — Progress Notes (Signed)
[name] for Infectious Disease  Date of Admission:  04/18/2021     Total days of antibiotics 5         ASSESSMENT:  Zachary [name] is POD #2 from right hallux amputation and blood cultures from 1/9 have remained without growth to date. Surgical specimen is polymicrobial with Staphylococcus aureus, Klebsiella oxytoca, and E. Coli. Will obtain MRI of the left foot and with ongoing back pain lumbar spine. Continue current dose of Cefazolin pending sensitivities of newly identified bacteria. No indications for TEE at the present time. Okay for PICC line as blood cultures have remained clear. Post-operative wound care per Podiatry. Remaining medical and supportive care per Primary Team.   PLAN:  Continue current dose of cefazolin. PICC line ordered.  Continue to monitor cultures for sensitivities and clearance of bacteremia.  MRI left foot and lumbar spine.  Post-surgical wound care per Podiatry.  Remaining medical and supportive care per primary team.   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 osteomyelitis of rigth great toe    Hyponatremia   E. coli UTI    diltiazem  120 mg Oral Daily   insulin aspart  0-9 Units Subcutaneous TID WC   insulin glargine-yfgn  20 Units Subcutaneous Daily   polyethylene glycol  17 g Oral Daily   senna-docusate  2 tablet Oral Daily    SUBJECTIVE:  Afebrile overnight with no acute events. Having foot pain. Been having back pain following a fall few weeks ago.   Allergies  Allergen Reactions   Metformin Hcl Other (See Comments)    Unknown reaction   Pravastatin Sodium Other (See Comments)    Muscle pain      Review of Systems: Review of Systems   Constitutional:  Negative for chills, fever and weight loss.  Respiratory:  Negative for cough, shortness of breath and wheezing.   Cardiovascular:  Negative for chest pain and leg swelling.  Gastrointestinal:  Negative for abdominal pain, constipation, diarrhea, nausea and vomiting.  Musculoskeletal:  Positive for back pain.       Right foot pain  Skin:  Negative for rash.     OBJECTIVE: Vitals:   04/22/21 0426 04/22/21 0539 04/22/21 0911 04/22/21 0914  BP: (!) 148/71  (!) 117/55 (!) 117/55  Pulse: 78   80  Resp: 18  18 18   Temp: 98.7 F (37.1 C)  97.7 F (36.5 C) 97.7 F (36.5 C)  TempSrc: Oral  Oral Oral  SpO2: 97%   98%  Weight:  88 kg    Height:       Body mass index is 29.5 kg/m.  Physical Exam Constitutional:      General: He is not in acute distress.    Appearance: He is well-developed.  Cardiovascular:     Rate and Rhythm: Normal rate and regular rhythm.     Heart sounds: Normal heart sounds.  Pulmonary:     Effort: Pulmonary effort is normal.     Breath sounds: Normal breath sounds.  Musculoskeletal:     Comments: Post surgical dressing clean and dry.   Skin:    General: Skin is warm and dry.  Neurological:     Mental Status: He is  alert and oriented to person, place, and time.  Psychiatric:        Behavior: Behavior normal.        Thought Content: Thought content normal.        Judgment: Judgment normal.    Lab Results Lab Results  Component Value Date   WBC 12.8 (H) 04/21/2021   HGB 12.7 (L) 04/21/2021   HCT 36.8 (L) 04/21/2021   MCV 85.0 04/21/2021   PLT 269 04/21/2021    Lab Results  Component Value Date   CREATININE 1.41 (H) 04/22/2021   BUN 24 (H) 04/22/2021   NA 132 (L) 04/22/2021   K 3.6 04/22/2021   CL 97 (L) 04/22/2021   CO2 26 04/22/2021    Lab Results  Component Value Date   ALT 9 04/22/2021   AST 19 04/22/2021   ALKPHOS 67 04/22/2021   BILITOT 0.3 04/22/2021     Microbiology: Recent Results (from the past 240  hour(s))  Resp Panel by RT-PCR (Flu A&B, Covid) Nasopharyngeal Swab     Status: None   Collection Time: 04/18/21  4:35 PM   Specimen: Nasopharyngeal Swab; Nasopharyngeal(NP) swabs in vial transport medium  Result Value Ref Range Status   SARS Coronavirus 2 by RT PCR NEGATIVE NEGATIVE Final    Comment: (NOTE) SARS-CoV-2 target nucleic acids are NOT DETECTED.  The SARS-CoV-2 RNA is generally detectable in upper respiratory specimens during the acute phase of infection. The lowest concentration of SARS-CoV-2 viral copies this assay can detect is 138 copies/mL. A negative result does not preclude SARS-Cov-2 infection and should not be used as the sole basis for treatment or other patient management decisions. A negative result may occur with  improper specimen collection/handling, submission of specimen other than nasopharyngeal swab, presence of viral mutation(s) within the areas targeted by this assay, and inadequate number of viral copies(<138 copies/mL). A negative result must be combined with clinical observations, patient history, and epidemiological information. The expected result is Negative.  Fact Sheet for Patients:  EntrepreneurPulse.com.au  Fact Sheet for Healthcare Providers:  IncredibleEmployment.be  This test is no t yet approved or cleared by the [place] FDA and  has been authorized for detection and/or diagnosis of SARS-CoV-2 by FDA under an Emergency Use Authorization (EUA). This EUA will remain  in effect (meaning this test can be used) for the duration of the COVID-19 declaration under Section 564(b)(1) of the Act, 21 U.S.C.section 360bbb-3(b)(1), unless the authorization is terminated  or revoked sooner.       Influenza A by PCR NEGATIVE NEGATIVE Final   Influenza B by PCR NEGATIVE NEGATIVE Final    Comment: (NOTE) The Xpert Xpress SARS-CoV-2/FLU/RSV plus assay is intended as an aid in the diagnosis of influenza from  Nasopharyngeal swab specimens and should not be used as a sole basis for treatment. Nasal washings and aspirates are unacceptable for Xpert Xpress SARS-CoV-2/FLU/RSV testing.  Fact Sheet for Patients: EntrepreneurPulse.com.au  Fact Sheet for Healthcare Providers: IncredibleEmployment.be  This test is not yet approved or cleared by the [place] FDA and has been authorized for detection and/or diagnosis of SARS-CoV-2 by FDA under an Emergency Use Authorization (EUA). This EUA will remain in effect (meaning this test can be used) for the duration of the COVID-19 declaration under Section 564(b)(1) of the Act, 21 U.S.C. section 360bbb-3(b)(1), unless the authorization is terminated or revoked.  Performed at [facility], [place] [address] [place], [place] [address]   Urine Culture     Status:  Abnormal   Collection Time: 04/18/21  4:35 PM   Specimen: In/Out Cath Urine  Result Value Ref Range Status   Specimen Description IN/OUT CATH URINE  Final   Special Requests   Final    NONE Performed at [facility], [address] [place], [place] [address]    Culture >=100,000 COLONIES/mL ESCHERICHIA COLI (A)  Final   Report Status 04/21/2021 FINAL  Final   Organism ID, Bacteria ESCHERICHIA COLI (A)  Final      Susceptibility   Escherichia coli - MIC*    AMPICILLIN >=32 RESISTANT Resistant     CEFAZOLIN >=64 RESISTANT Resistant     CEFEPIME <=0.12 SENSITIVE Sensitive     CEFTRIAXONE <=0.25 SENSITIVE Sensitive     CIPROFLOXACIN <=0.25 SENSITIVE Sensitive     GENTAMICIN <=1 SENSITIVE Sensitive     IMIPENEM <=0.25 SENSITIVE Sensitive     NITROFURANTOIN <=16 SENSITIVE Sensitive     TRIMETH/SULFA <=20 SENSITIVE Sensitive     AMPICILLIN/SULBACTAM >=32 RESISTANT Resistant     PIP/TAZO <=4 SENSITIVE Sensitive     * >=100,000 COLONIES/mL ESCHERICHIA COLI  Blood Culture (routine x 2)     Status: Abnormal   Collection Time: 04/18/21  4:40 PM    Specimen: BLOOD RIGHT FOREARM  Result Value Ref Range Status   Specimen Description BLOOD RIGHT FOREARM  Final   Special Requests   Final    BOTTLES DRAWN AEROBIC AND ANAEROBIC Blood Culture results may not be optimal due to an excessive volume of blood received in culture bottles   Culture  Setup Time   Final    GRAM POSITIVE COCCI IN CLUSTERS IN BOTH AEROBIC AND ANAEROBIC BOTTLES CRITICAL VALUE NOTED.  VALUE IS CONSISTENT WITH PREVIOUSLY REPORTED AND CALLED VALUE.    Culture (A)  Final    STAPHYLOCOCCUS AUREUS SUSCEPTIBILITIES PERFORMED ON PREVIOUS CULTURE WITHIN THE LAST 5 DAYS. Performed at [facility], [place] [address] [place], [place] [address]    Report Status 04/21/2021 FINAL  Final  Blood Culture (routine x 2)     Status: Abnormal   Collection Time: 04/18/21  5:10 PM   Specimen: BLOOD RIGHT HAND  Result Value Ref Range Status   Specimen Description BLOOD RIGHT HAND  Final   Special Requests   Final    BOTTLES DRAWN AEROBIC AND ANAEROBIC Blood Culture results may not be optimal due to an inadequate volume of blood received in culture bottles   Culture  Setup Time   Final    GRAM POSITIVE COCCI IN CLUSTERS IN BOTH AEROBIC AND ANAEROBIC BOTTLES CRITICAL RESULT CALLED TO, READ BACK BY AND VERIFIED WITH: Carla Drape [id number] AT 35 BY CM Performed at [facility], [place] [address] [place], [place] [address]    Culture STAPHYLOCOCCUS AUREUS (A)  Final   Report Status 04/21/2021 FINAL  Final   Organism ID, Bacteria STAPHYLOCOCCUS AUREUS  Final      Susceptibility   Staphylococcus aureus - MIC*    CIPROFLOXACIN <=0.5 SENSITIVE Sensitive     ERYTHROMYCIN <=0.25 SENSITIVE Sensitive     GENTAMICIN <=0.5 SENSITIVE Sensitive     OXACILLIN <=0.25 SENSITIVE Sensitive     TETRACYCLINE <=1 SENSITIVE Sensitive     VANCOMYCIN <=0.5 SENSITIVE Sensitive     TRIMETH/SULFA <=10 SENSITIVE Sensitive     CLINDAMYCIN <=0.25 SENSITIVE Sensitive     RIFAMPIN <=0.5 SENSITIVE  Sensitive     Inducible Clindamycin NEGATIVE Sensitive     * STAPHYLOCOCCUS AUREUS  Blood Culture ID Panel (Reflexed)     Status: Abnormal   Collection Time: 04/18/21  5:10 PM  Result Value Ref Range Status   Enterococcus faecalis NOT DETECTED NOT DETECTED Final   Enterococcus Faecium NOT DETECTED NOT DETECTED Final   Listeria monocytogenes NOT DETECTED NOT DETECTED Final   Staphylococcus species DETECTED (A) NOT DETECTED Final    Comment: CRITICAL RESULT CALLED TO, READ BACK BY AND VERIFIED WITH: PHARMD C JACKSON [id number] AT 1037 BY CM    Staphylococcus aureus (BCID) DETECTED (A) NOT DETECTED Final    Comment: CRITICAL RESULT CALLED TO, READ BACK BY AND VERIFIED WITH: PHARMD C JACKSON [id number] AT 1037 BY CM    Staphylococcus epidermidis NOT DETECTED NOT DETECTED Final   Staphylococcus lugdunensis NOT DETECTED NOT DETECTED Final   Streptococcus species NOT DETECTED NOT DETECTED Final   Streptococcus agalactiae NOT DETECTED NOT DETECTED Final   Streptococcus pneumoniae NOT DETECTED NOT DETECTED Final   Streptococcus pyogenes NOT DETECTED NOT DETECTED Final   A.calcoaceticus-baumannii NOT DETECTED NOT DETECTED Final   Bacteroides fragilis NOT DETECTED NOT DETECTED Final   Enterobacterales NOT DETECTED NOT DETECTED Final   Enterobacter cloacae complex NOT DETECTED NOT DETECTED Final   Escherichia coli NOT DETECTED NOT DETECTED Final   Klebsiella aerogenes NOT DETECTED NOT DETECTED Final   Klebsiella oxytoca NOT DETECTED NOT DETECTED Final   Klebsiella pneumoniae NOT DETECTED NOT DETECTED Final   Proteus species NOT DETECTED NOT DETECTED Final   Salmonella species NOT DETECTED NOT DETECTED Final   Serratia marcescens NOT DETECTED NOT DETECTED Final   Haemophilus influenzae NOT DETECTED NOT DETECTED Final   Neisseria meningitidis NOT DETECTED NOT DETECTED Final   Pseudomonas aeruginosa NOT DETECTED NOT DETECTED Final   Stenotrophomonas maltophilia NOT DETECTED NOT DETECTED Final    Candida albicans NOT DETECTED NOT DETECTED Final   Candida auris NOT DETECTED NOT DETECTED Final   Candida glabrata NOT DETECTED NOT DETECTED Final   Candida krusei NOT DETECTED NOT DETECTED Final   Candida parapsilosis NOT DETECTED NOT DETECTED Final   Candida tropicalis NOT DETECTED NOT DETECTED Final   Cryptococcus neoformans/gattii NOT DETECTED NOT DETECTED Final   Meth resistant mecA/C and MREJ NOT DETECTED NOT DETECTED Final    Comment: Performed at [facility], [address] [place], [place] [address]  Respiratory (~20 pathogens) panel by PCR     Status: None   Collection Time: 04/18/21  8:09 PM   Specimen: Nasopharyngeal Swab; Respiratory  Result Value Ref Range Status   Adenovirus NOT DETECTED NOT DETECTED Final   Coronavirus 229E NOT DETECTED NOT DETECTED Final    Comment: (NOTE) The Coronavirus on the Respiratory Panel, DOES NOT test for the novel  Coronavirus (2019 nCoV)    Coronavirus HKU1 NOT DETECTED NOT DETECTED Final   Coronavirus NL63 NOT DETECTED NOT DETECTED Final   Coronavirus OC43 NOT DETECTED NOT DETECTED Final   Metapneumovirus NOT DETECTED NOT DETECTED Final   Rhinovirus / Enterovirus NOT DETECTED NOT DETECTED Final   Influenza A NOT DETECTED NOT DETECTED Final   Influenza B NOT DETECTED NOT DETECTED Final   Parainfluenza Virus 1 NOT DETECTED NOT DETECTED Final   Parainfluenza Virus 2 NOT DETECTED NOT DETECTED Final   Parainfluenza Virus 3 NOT DETECTED NOT DETECTED Final   Parainfluenza Virus 4 NOT DETECTED NOT DETECTED Final   Respiratory Syncytial Virus NOT DETECTED NOT DETECTED Final   Bordetella pertussis NOT DETECTED NOT DETECTED Final   Bordetella Parapertussis NOT DETECTED NOT DETECTED  Final   Chlamydophila pneumoniae NOT DETECTED NOT DETECTED Final   Mycoplasma pneumoniae NOT DETECTED NOT DETECTED Final    Comment: Performed at [facility], [place] [address] [place], [place] [address]  MRSA Next Gen by PCR, Nasal     Status: None    Collection Time: 04/19/21  7:00 AM   Specimen: Nasal Mucosa; Nasal Swab  Result Value Ref Range Status   MRSA by PCR Next Gen NOT DETECTED NOT DETECTED Final    Comment: (NOTE) The GeneXpert MRSA Assay (FDA approved for NASAL specimens only), is one component of a comprehensive MRSA colonization surveillance program. It is not intended to diagnose MRSA infection nor to guide or monitor treatment for MRSA infections. Test performance is not FDA approved in patients less than 36 years old. Performed at [facility], [place] [address], [place], [place] [address]   Culture, blood (routine x 2)     Status: None (Preliminary result)   Collection Time: 04/19/21  8:41 PM   Specimen: BLOOD RIGHT HAND  Result Value Ref Range Status   Specimen Description BLOOD RIGHT HAND  Final   Special Requests   Final    BOTTLES DRAWN AEROBIC AND ANAEROBIC Blood Culture adequate volume   Culture   Final    NO GROWTH 3 DAYS Performed at [facility], [place] [address] [place], [place] [address]    Report Status PENDING  Incomplete  Culture, blood (routine x 2)     Status: None (Preliminary result)   Collection Time: 04/19/21  8:41 PM   Specimen: BLOOD  Result Value Ref Range Status   Specimen Description BLOOD LEFT ARM  Final   Special Requests   Final    BOTTLES DRAWN AEROBIC AND ANAEROBIC Blood Culture adequate volume   Culture   Final    NO GROWTH 3 DAYS Performed at [facility], [address], [place], [place] [address]    Report Status PENDING  Incomplete  Surgical pcr screen     Status: None   Collection Time: 04/20/21  5:42 AM   Specimen: Nasal Mucosa; Nasal Swab  Result Value Ref Range Status   MRSA, PCR NEGATIVE NEGATIVE Final   Staphylococcus aureus NEGATIVE NEGATIVE Final    Comment: (NOTE) The Xpert SA Assay (FDA approved for NASAL specimens in patients 11 years of age and older), is one component of a comprehensive surveillance program. It is not intended to diagnose  infection nor to guide or monitor treatment. Performed at [facility], [place] [address] [place], [place] [address]   Culture, fungus without smear     Status: None (Preliminary result)   Collection Time: 04/20/21 10:44 AM   Specimen: Toe, Right; Amputation  Result Value Ref Range Status   Specimen Description TOE  Final   Special Requests RIGHT TOE  Final   Culture   Final    NO FUNGUS ISOLATED AFTER 1 DAY Performed at [facility], [address], [place], [place] [address]    Report Status PENDING  Incomplete  Aerobic/Anaerobic Culture w Gram Stain (surgical/deep wound)     Status: None (Preliminary result)   Collection Time: 04/20/21 10:44 AM   Specimen: Toe, Right; Amputation  Result Value Ref Range Status   Specimen Description TOE  Final   Special Requests RIGHT TOE  Final   Gram Stain   Final    RARE WBC PRESENT, PREDOMINANTLY MONONUCLEAR MODERATE GRAM POSITIVE COCCI RARE GRAM NEGATIVE  RODS    Culture   Final    FEW STAPHYLOCOCCUS AUREUS MODERATE ESCHERICHIA COLI MODERATE KLEBSIELLA OXYTOCA NO ANAEROBES ISOLATED; CULTURE IN PROGRESS FOR 5 DAYS CULTURE REINCUBATED FOR BETTER GROWTH Performed at [facility], [place] [address], [place], [place] [address]    Report Status PENDING  Incomplete   Organism ID, Bacteria ESCHERICHIA COLI  Final   Organism ID, Bacteria KLEBSIELLA OXYTOCA  Final      Susceptibility   Escherichia coli - MIC*    AMPICILLIN >=32 RESISTANT Resistant     CEFAZOLIN >=64 RESISTANT Resistant     CEFEPIME <=0.12 SENSITIVE Sensitive     CEFTAZIDIME <=1 SENSITIVE Sensitive     CEFTRIAXONE 0.5 SENSITIVE Sensitive     CIPROFLOXACIN <=0.25 SENSITIVE Sensitive     GENTAMICIN <=1 SENSITIVE Sensitive     IMIPENEM <=0.25 SENSITIVE Sensitive     TRIMETH/SULFA <=20 SENSITIVE Sensitive     AMPICILLIN/SULBACTAM >=32 RESISTANT Resistant     PIP/TAZO 16 SENSITIVE Sensitive     * MODERATE ESCHERICHIA COLI   Klebsiella oxytoca - MIC*     AMPICILLIN >=32 RESISTANT Resistant     CEFAZOLIN 8 SENSITIVE Sensitive     CEFEPIME <=0.12 SENSITIVE Sensitive     CEFTAZIDIME <=1 SENSITIVE Sensitive     CEFTRIAXONE <=0.25 SENSITIVE Sensitive     CIPROFLOXACIN <=0.25 SENSITIVE Sensitive     GENTAMICIN <=1 SENSITIVE Sensitive     IMIPENEM <=0.25 SENSITIVE Sensitive     TRIMETH/SULFA <=20 SENSITIVE Sensitive     AMPICILLIN/SULBACTAM 8 SENSITIVE Sensitive     PIP/TAZO <=4 SENSITIVE Sensitive     * MODERATE KLEBSIELLA OXYTOCA     Terri Piedra, NP [facility]  04/22/2021  2:01 PM

## 2021-04-22 NOTE — Progress Notes (Signed)
Progress Note   Patient: Zachary [name] JEH:[id number] DOB: 1945-11-05 DOA: 04/18/2021     3 DOS: the patient was seen and examined on 04/22/2021   Brief hospital course: Patient is a 76 year old male with a history of type 2 diabetes mellitus, HTN, HLD, PVD, BPH presented with bilateral leg pain.He had  aorto-peripheral angiogram on 04/14/21 by IR. After the procedure he reportedly started to have pain in his legs. No bleeding or drainage from procedure site. He also has had right lower back pain for the past few days as well.  Work-up revealed osteomyelitis of both proximal and distal phalanx of the great toe seen on MRI 04/18/2021.   Underwent right great toe amputation on 1/10, podiatry following  Hospital course complicated by MSSA bacteremia, E. coli UTI.  ID consulted  Continue IV cefazolin.  Surgical cultures with E. coli and Klebsiella  1/12: Follow-up MRI left foot, MRI L spine  Assessment and Plan * MSSA bacteremia - Hospital course complicated by MSSA bacteremia, POA -ID consulted.  Switched to cefazolin on 04/19/2021 -2D echo showed EF 40 to 45%, no vegetation.  Discussed with ID, hold off on TEE for now.  Osteomyelitis of great toe of right foot ([place])- (present on admission) - Underwent amputation of the right great toe, application of skin graft substitute on 1/10, postop day #2 - stable from podiatry standpoint, recommended to leave dressing intact until follow-up outpatient  -ID following for antibiotics, continue cefazolin.  Blood cultures 1/9 NTD -Surgical cultures positive for E. coli, Klebsiella, few staff aureus -Discussed with ID, also checking MRI of the left foot.  Given patient is also complaining of low back pain for the last 2 weeks, will obtain MRI of the lumbar spine (did have a fall 2 months ago per wife).   Peripheral vascular disease ([place])- (present on admission) -Recent angiogram to right leg on 04/14/21 by IR, no signs of infection at incision site -Has had  progressive pain in both legs, Dr Rogers Blocker had discussed with IR, Dr. Annamaria Boots, -Holding plavix/ASA until cleared by podiatry to resume -Refuses statin.  Outpatient follow-up with IR  E. coli UTI- (present on admission) UA positive for pyuria, urine culture on 1/8 showed more than 100,000 colonies of E. Coli. -Currently on IV cefazolin.  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 -Also had anion gap metabolic acidosis likely due CKD.  Patient was placed on IV gentle hydration.  Gap closed. -Creatinine improving, at baseline  Essential hypertension- (present on admission) - Stable, continue hydralazine IV as needed with parameters, continue Cardizem  Hyperlipidemia- (present on admission) -Refuses to take statin  sepsis secondary to pneumonia vs. osteomyelitis of rigth great toe - (present on admission) - Met sepsis criteria at the time of admission with leukocytosis, tachycardia, tachypnea, source gangrenous right toe with osteomyelitis.  - -initially given vanc/cefepime and flagyl, changeds to vanc/rocephin and Zithromax to cover for osteo-, then transition to IV cefazolin on  04/19/2021 for MSSA bacteremia -Underwent right great toe amputation on 1/10, currently stable from podiatry standpoint.  Follow surgical cultures. -ID following     Subjective: Complaining of back pain 5/10, in the lower lumbar spine.  No radicular signs.   Family at the bedside.  Per wife had a fall 2 months ago however back pain has been worse in the last 2 weeks.  Objective BP (!) 117/55 (BP Location: Left Arm)    Pulse 80    Temp 97.7 F (36.5 C) (Oral)    Resp 18    Ht 5' 8" (1.727 m)    Wt 88 kg    SpO2 98%    BMI 29.50 kg/m   Physical Exam General: Alert and oriented x 3, NAD Cardiovascular: S1 S2 clear, RRR.  Respiratory: CTAB, no wheezing, rales or rhonchi Gastrointestinal: Soft, nontender, nondistended, NBS Ext: no pedal edema bilaterally Neuro: no new deficits Skin: Dressing intact right foot Psych: Normal affect and demeanor, alert and oriented x3    Data Reviewed:  My review of labs, imaging, notes and other tests shows no new significant findings.   CBC Latest Ref Rng & Units 04/21/2021 04/20/2021 04/19/2021  WBC 4.0 - 10.5 K/uL 12.8(H) 15.4(H) 25.3(H)  Hemoglobin 13.0 - 17.0 g/dL 12.7(L) 12.8(L) 13.9  Hematocrit 39.0 - 52.0 % 36.8(L) 36.6(L) 39.7  Platelets 150 - 400 K/uL 269 243 260    BMP Latest Ref Rng & Units 04/22/2021 04/21/2021 04/20/2021  Glucose 70 - 99 mg/dL 235(H) 203(H) 248(H)  BUN 8 - 23 mg/dL 24(H) 33(H) 38(H)  Creatinine 0.61 - 1.24 mg/dL 1.41(H) 1.53(H) 1.57(H)  Sodium 135 - 145 mmol/L 132(L) 126(L) 128(L)  Potassium 3.5 - 5.1 mmol/L 3.6 3.9 3.6  Chloride 98 - 111 mmol/L 97(L) 96(L) 97(L)  CO2 22 - 32 mmol/L 26 21(L) 23  Calcium 8.9 - 10.3 mg/dL 7.8(L) 7.6(L) 7.5(L)    Family Communication: Discussed with patient's son and wife at the bedside  Disposition: Status is: Inpatient  Remains inpatient appropriate because: Not medically ready, ongoing work-up   Time spent: 29mns  Author: REstill Cotta MD 04/22/2021 2:14 PM  For on call review [email or web address]

## 2021-04-22 NOTE — Progress Notes (Addendum)
Physical Therapy Treatment Patient Details Name: Zachary [name] MRN: [id number] DOB: [birth year] Today's Date: 04/22/2021   History of Present Illness Pt is a 76 yo male admitted for osteomyelitis of the R great toe. Pt underwent amputation of great toe on 1/10 w skin graft. Pt with MSSA bacteremia. PMH:  DM2, HLD, HTN, PVD, and L great toe amputation.    PT Comments    Pt with improved tolerance for activity, however remains limited compared to baseline. Pt continues to require physical assistance to ascend into standing. Pt will benefit from inpatient PT placement to aide in improving independence in transfers, as well as increasing ambulation tolerance. If the pt returns home rather than SNF, PT recommends assistance for all transfers, use of wheelchair for household and limited community mobility. Pt will need assistance to negotiate one step in wheelchair from family. Acute PT will continue to follow to aide in improving mobility quality.  Recommendations for follow up therapy are one component of a multi-disciplinary discharge planning process, led by the attending physician.  Recommendations may be updated based on patient status, additional functional criteria and insurance authorization.  Follow Up Recommendations  Skilled nursing-short term rehab (<3 hours/day)     Assistance Recommended at Discharge Intermittent Supervision/Assistance (assistance for all transfers and ambulation)  Patient can return home with the following A lot of help with walking and/or transfers;A little help with bathing/dressing/bathroom;Assistance with cooking/housework;Help with stairs or ramp for entrance   Equipment Recommendations  Wheelchair (measurements PT);Wheelchair cushion (measurements PT) (pt reports owning a BSC and a RW)    Recommendations for Other Services       Precautions / Restrictions Precautions Precautions: Fall Required Braces or Orthoses: Other Brace Other Brace: R CAM  boot Restrictions Weight Bearing Restrictions: Yes RLE Weight Bearing: Weight bearing as tolerated Other Position/Activity Restrictions: WBAT with CAM boot     Mobility  Bed Mobility Overal bed mobility: Needs Assistance Bed Mobility: Supine to Sit     Supine to sit: Supervision;HOB elevated          Transfers Overall transfer level: Needs assistance Equipment used: Rolling walker (2 wheels) Transfers: Sit to/from Stand Sit to Stand: Mod assist;Min assist     Step pivot transfers: Min assist     General transfer comment: pt requires modA for initial transfer, improves to minA with cues for hand placement, trunk flexion and forward lean.    Ambulation/Gait Ambulation/Gait assistance: Min assist Gait Distance (Feet): 12 Feet Assistive device: Rolling walker (2 wheels) Gait Pattern/deviations: Step-through pattern Gait velocity: reduced Gait velocity interpretation: <1.8 ft/sec, indicate of risk for recurrent falls   General Gait Details: pt with slowed step-through gait, minimal stride length, reduced stance time on RLE   Stairs             Wheelchair Mobility    Modified Rankin (Stroke Patients Only)       Balance Overall balance assessment: Needs assistance Sitting-balance support: No upper extremity supported;Feet supported Sitting balance-Leahy Scale: Good     Standing balance support: Bilateral upper extremity supported;Reliant on assistive device for balance Standing balance-Leahy Scale: Poor                              Cognition Arousal/Alertness: Awake/alert Behavior During Therapy: WFL for tasks assessed/performed Overall Cognitive Status: Within Functional Limits for tasks assessed  Exercises      General Comments General comments (skin integrity, edema, etc.): VSS on RA, pt is incontinent of urine during session      Pertinent Vitals/Pain Pain Assessment:  Faces Faces Pain Scale: Hurts little more Pain Location: LLE initially, later reports RLE Pain Descriptors / Indicators: Sore Pain Intervention(s): Monitored during session    Home Living                          Prior Function            PT Goals (current goals can now be found in the care plan section) Acute Rehab PT Goals Patient Stated Goal: go home Progress towards PT goals: Progressing toward goals    Frequency    Min 3X/week      PT Plan Discharge plan needs to be updated    Co-evaluation              AM-PAC PT "6 Clicks" Mobility   Outcome Measure  Help needed turning from your back to your side while in a flat bed without using bedrails?: A Little Help needed moving from lying on your back to sitting on the side of a flat bed without using bedrails?: A Little Help needed moving to and from a bed to a chair (including a wheelchair)?: A Little Help needed standing up from a chair using your arms (e.g., wheelchair or bedside chair)?: A Little Help needed to walk in hospital room?: Total Help needed climbing 3-5 steps with a railing? : Total 6 Click Score: 14    End of Session   Activity Tolerance: Patient tolerated treatment well Patient left: in chair;with call bell/phone within reach Nurse Communication: Mobility status PT Visit Diagnosis: Unsteadiness on feet (R26.81);Muscle weakness (generalized) (M62.81);Difficulty in walking, not elsewhere classified (R26.2);Pain Pain - Right/Left: Right Pain - part of body: Ankle and joints of foot     Time: 3202-3343 PT Time Calculation (min) (ACUTE ONLY): 28 min  Charges:  $Gait Training: 8-22 mins $Therapeutic Activity: 8-22 mins                     Zenaida Niece, PT, DPT Acute Rehabilitation Pager: [phone] Office [place] 04/22/2021, 12:17 PM

## 2021-04-22 NOTE — NC FL2 (Signed)
[place] LEVEL OF CARE SCREENING TOOL     IDENTIFICATION  Patient Name: Zachary [name] Birthdate: [birth year] Sex: male Admission Date (Current Location): 04/18/2021  [facility] and [place] Number:  Herbalist and Address:  [facility], [place] [address] [place], [place] [address]      Provider Number: [id number]  Attending Physician Name and Address:  Mendel Corning, MD  Relative Name and Phone Number:  Nathon, Stefanski (Spouse)   [phone]    Current Level of Care: Hospital Recommended Level of Care: [place] Prior Approval Number:    Date Approved/Denied:   PASRR Number: [id number]  Discharge Plan: SNF    Current Diagnoses: Patient Active Problem List   Diagnosis Date Noted   E. coli UTI 04/21/2021   MSSA bacteremia 04/19/2021   Osteomyelitis of great toe of right foot ([place]) 04/18/2021   sepsis secondary to pneumonia vs. 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   Headache 03/24/2020   Hyperlipidemia, unspecified 03/24/2020   Hyperlipidemia 03/24/2020   Long term (current) use of insulin ([place]) 03/24/2020   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 ([place]) 03/24/2020   Stage 3 chronic kidney disease ([place]) 03/24/2020   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 ([place]) 40/37/0964   Gangrenous toe ([place])    Acute osteomyelitis ([place]) 09/13/2019   Quadriceps tendon rupture, left, initial encounter 01/10/2019   Closed comminuted  fracture of left patella, initial encounter 01/08/2019    Orientation RESPIRATION BLADDER Height & Weight     Self, Time, Situation  Normal Incontinent Weight: 194 lb 0.1 oz (88 kg) Height:  5\' 8"  (172.7 cm)  BEHAVIORAL SYMPTOMS/MOOD NEUROLOGICAL BOWEL NUTRITION STATUS      Continent Diet (see d/c summary)  AMBULATORY STATUS COMMUNICATION OF NEEDS Skin   Limited Assist Verbally Surgical wounds                       Personal Care Assistance Level of Assistance  Bathing, Feeding, Dressing Bathing Assistance: Limited assistance Feeding assistance: Independent Dressing Assistance: Limited assistance     Functional Limitations Info  Sight, Hearing, Speech Sight Info: Adequate Hearing Info: Adequate Speech Info: Adequate    SPECIAL CARE FACTORS FREQUENCY  PT (By licensed PT), OT (By licensed OT)     PT Frequency: 5x/ week OT Frequency: 5x/ week            Contractures Contractures Info: Not present    Additional Factors Info  Allergies, Insulin Sliding Scale, Code Status Code Status Info: DNR Allergies Info: Metformin Hcl, Pravastatin Sodium   Insulin Sliding Scale Info: see d/c summary       Current Medications (04/22/2021):  This is the current hospital active medication list Current Facility-Administered Medications  Medication Dose Route Frequency Provider Last Rate Last Admin   0.9 %  sodium chloride infusion   Intravenous PRN Rai, Vernelle Emerald, MD   Stopped at 04/22/21 0740   acetaminophen (TYLENOL) tablet 650 mg  650 mg Oral Q6H PRN [name], [name]  650 mg at 04/19/21 0220   Or   acetaminophen (TYLENOL) suppository 650 mg  650 mg Rectal Q6H PRN [name], [name]       ceFAZolin (ANCEF) IVPB 2g/100 mL premix  2 g Intravenous Q8H [name], [name]   Stopped at 04/22/21 0740   diltiazem (CARDIZEM CD) 24 hr capsule 120 mg  120 mg Oral Daily [name], [name]   120 mg at 04/22/21 0908   guaiFENesin-dextromethorphan (ROBITUSSIN DM) 100-10  MG/5ML syrup 5 mL  5 mL Oral Q4H PRN [name], [name]   5 mL at 04/20/21 1523   hydrALAZINE (APRESOLINE) injection 5 mg  5 mg Intravenous Q8H PRN [name], [name]       HYDROcodone-acetaminophen (NORCO/VICODIN) 5-325 MG per tablet 1-2 tablet  1-2 tablet Oral Q4H PRN [name], [name]   2 tablet at 04/22/21 1139   insulin aspart (novoLOG) injection 0-9 Units  0-9 Units Subcutaneous TID WC [name], [name]   3 Units at 04/22/21 1222   insulin glargine-yfgn (SEMGLEE) injection 20 Units  20 Units Subcutaneous Daily [name], [name]   20 Units at 04/22/21 0908   ipratropium-albuterol (DUONEB) 0.5-2.5 (3) MG/3ML nebulizer solution 3 mL  3 mL Nebulization Q6H PRN [name], [name]   3 mL at 04/19/21 0153   polyethylene glycol (MIRALAX / GLYCOLAX) packet 17 g  17 g Oral Daily Rai, Ripudeep K, MD   17 g at 04/22/21 0908   senna-docusate (Senokot-S) tablet 2 tablet  2 tablet Oral Daily Kayleen Memos, DO   2 tablet at 04/22/21 0908     Discharge Medications: Please see discharge summary for a list of discharge medications.  Relevant Imaging Results:  Relevant Lab Results:   Additional Information SSN:  [id number];  COVID x3;  5'8"  194lbs;  Paulene Floor Handsome Anglin, LCSWA

## 2021-04-22 NOTE — Care Management Important Message (Signed)
Important Message  Patient Details  Name: Zachary [name] MRN: [id number] Date of Birth: 04/25/1945   Medicare Important Message Given:  Yes     Orbie Pyo 04/22/2021, 2:46 PM

## 2021-04-23 ENCOUNTER — Ambulatory Visit: Payer: Medicare Other | Admitting: Podiatry

## 2021-04-23 DIAGNOSIS — B9561 Methicillin susceptible Staphylococcus aureus infection as the cause of diseases classified elsewhere: Secondary | ICD-10-CM | POA: Diagnosis not present

## 2021-04-23 DIAGNOSIS — R7881 Bacteremia: Secondary | ICD-10-CM | POA: Diagnosis not present

## 2021-04-23 DIAGNOSIS — G062 Extradural and subdural abscess, unspecified: Secondary | ICD-10-CM

## 2021-04-23 LAB — GLUCOSE, CAPILLARY
Glucose-Capillary: 138 mg/dL — ABNORMAL HIGH (ref 70–99)
Glucose-Capillary: 142 mg/dL — ABNORMAL HIGH (ref 70–99)
Glucose-Capillary: 171 mg/dL — ABNORMAL HIGH (ref 70–99)
Glucose-Capillary: 184 mg/dL — ABNORMAL HIGH (ref 70–99)

## 2021-04-23 NOTE — Assessment & Plan Note (Addendum)
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 Kathyrn Sheriff recommended continue IV antibiotics and repeat MRI of the lumbar spine w/wo contrast in 2-3 weeks.  Currently no plan for surgical intervention. -Continue pain control

## 2021-04-23 NOTE — Progress Notes (Signed)
Progress Note   Patient: Zachary [name] HFW:[id number] DOB: 11-12-1945 DOA: 04/18/2021     4 DOS: the patient was seen and examined on 04/23/2021   Brief hospital course: Patient is a 76 year old male with a history of type 2 diabetes mellitus, HTN, HLD, PVD, BPH presented with bilateral leg pain.He had  aorto-peripheral angiogram on 04/14/21 by IR. After the procedure he reportedly started to have pain in his legs. No bleeding or drainage from procedure site. He also has had right lower back pain for the past few days as well.  Work-up revealed osteomyelitis of both proximal and distal phalanx of the great toe seen on MRI 04/18/2021.   Underwent right great toe amputation on 1/10, podiatry following  Hospital course complicated by MSSA bacteremia, E. coli UTI.  ID consulted Surgical cultures with E. 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  Neurosurgery consulted MRI left foot: Osteomyelitis of the left fourth distal phalanx, podiatry reconsulted Cardiology consulted for TEE, planned on Monday  Assessment and Plan * MSSA bacteremia - Hospital course complicated by MSSA bacteremia, POA -ID consulted.  Switched to cefazolin on 04/19/2021 -2D echo showed EF 40 to 45%, no vegetation. -Given new MRI findings of epidural abscess, osteomyelitis fourth toe left foot, will proceed with TEE.  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 Kathyrn Sheriff, recommended continue IV antibiotics and repeat MRI of the lumbar spine with and without contrast in 2 to 3 weeks, serial neurological exam.  Currently no plan for surgical intervention.  Osteomyelitis of great toe of right foot ([place])- (present on admission) - Underwent amputation of the right great toe, application of skin graft  substitute on 1/10 - stable from podiatry standpoint, recommended to leave dressing intact until follow-up outpatient  - Blood cultures 1/9 NTD -Surgical cultures positive for E. coli, Klebsiella, few staff aureus, antibiotics changed to IV cefepime -MRI of the left foot showed osteomyelitis of the 4th distal phalanx, podiatry updated, Dr. Cannon Kettle will follow today.  Peripheral vascular disease ([place])- (present on admission) -Recent angiogram to right leg on 04/14/21 by IR, no signs of infection at incision site -Has had progressive pain in both legs, Dr Rogers Blocker had discussed with IR, Dr. Annamaria Boots, -Holding plavix/ASA until cleared by podiatry to resume -Refuses statin.  Outpatient follow-up with IR  E. coli UTI- (present on admission) UA positive for pyuria, urine culture on 1/8 showed more than 100,000 colonies of E. Coli. -Currently on IV cefazolin.  ID following  Hyponatremia- (present on admission) - Sodium improving, urine sodium 621, likely SIADH, hold off on IV fluids   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 ([place])- (present on admission) -  hemoglobin A1c 8.5 on 1/10  -CBGs still elevated likely due to acute infection, increase Semglee to 28 units, added NovoLog meal coverage 3 units TID AC, continue moderate SSI  Recent Labs    04/21/21 0625 04/21/21 1140 04/21/21 1620 04/21/21 2056 04/22/21 0623 04/22/21 1148  GLUCAP 229* 261* 215* 215* 176* 241*     Stage 3 chronic kidney disease ([place])- (present on admission) - CKD stage IIIb, baseline1.4-1.8. -Also had anion gap metabolic acidosis likely due CKD.  Patient was placed on IV gentle hydration.  Gap closed. -Creatinine improving, continue to monitor  Essential hypertension- (present on admission) - Stable, continue hydralazine  IV as needed with parameters, continue Cardizem  Hyperlipidemia- (present on admission) -Refuses to take statin  sepsis secondary to pneumonia vs. osteomyelitis of rigth great toe -  (present on admission) - Met sepsis criteria at the time of admission with leukocytosis, tachycardia, tachypnea, source gangrenous right toe with osteomyelitis.  - -initially given vanc/cefepime and flagyl, changeds to vanc/rocephin and Zithromax to cover for osteo-, then transition to IV cefazolin on 04/19/2021 for MSSA bacteremia -Underwent right great toe amputation on 1/10, currently stable from podiatry standpoint.  Follow surgical cultures. -ID following     Subjective: States back pain is somewhat better today, still 5/10.  No focal neurological deficits.  No headaches, chest pain or shortness of breath.  Patient son and the wife at the bedside  Objective Temp:  [97.7 F (36.5 C)-98.2 F (36.8 C)] 98 F (36.7 C) (01/13 0812) Pulse Rate:  [74-81] 81 (01/13 0812) Resp:  [18-20] 20 (01/13 0812) BP: (118-154)/(60-91) 154/91 (01/13 0812) SpO2:  [95 %-99 %] 96 % (01/13 0812) Weight:  [84.9 kg] 84.9 kg (01/13 0536)    Physical Exam General: Alert and oriented x 3, NAD Cardiovascular: S1 S2 clear, RRR. No pedal edema b/l Respiratory: CTAB, no wheezing, rales or rhonchi Gastrointestinal: Soft, nontender, nondistended, NBS Ext: right foot dressing intact, left fourth toe ulcer Neuro: no new deficits Psych: Normal affect and demeanor, alert and oriented x3    Data Reviewed: MRI lumbar spine, MRI left foot imaging reviewed, documented in the progress note  CBC Latest Ref Rng & Units 04/21/2021 04/20/2021 04/19/2021  WBC 4.0 - 10.5 K/uL 12.8(H) 15.4(H) 25.3(H)  Hemoglobin 13.0 - 17.0 g/dL 12.7(L) 12.8(L) 13.9  Hematocrit 39.0 - 52.0 % 36.8(L) 36.6(L) 39.7  Platelets 150 - 400 K/uL 269 243 260     Family Communication: Discussed with patient's son Marya Amsler on the phone and Mr. Mont Jagoda (son) and wife at the bedside  Disposition: Status is: Inpatient  Remains inpatient appropriate because: Ongoing work-up, not safe for discharge   Time spent: 50 minutes, in review of imaging,  d/w consultants and recommendations, examination and updating/discussion with family, patient  Author: Estill Cotta, MD 04/23/2021 3:19 PM  For on call review [email or web address].

## 2021-04-23 NOTE — Progress Notes (Signed)
Subjective: Zachary [name] is a 76 y.o. male patient seen at bedside, resting comfortably in no acute distress s/p day #3 Right great toe amputation.  Podiatry was reconsulted to see patient because of new problem of left fourth toe osteomyelitis. Patient denies pain at surgical site or any constitutional symptoms at this time.  No other issues noted.   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 coli UTI 04/21/2021   MSSA bacteremia 04/19/2021   Osteomyelitis of great toe of right foot ([place]) 04/18/2021   sepsis secondary to pneumonia vs. 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   Headache 03/24/2020   Hyperlipidemia, unspecified 03/24/2020   Hyperlipidemia 03/24/2020   Long term (current) use of insulin ([place]) 03/24/2020   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 ([place]) 03/24/2020   Stage 3 chronic kidney disease ([place]) 03/24/2020   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 ([place]) 44/06/4740   Gangrenous toe ([place])    Acute osteomyelitis ([place]) 09/13/2019   Quadriceps tendon rupture, left, initial encounter 01/10/2019   Closed comminuted fracture of left patella, initial encounter 01/08/2019     Current Facility-Administered Medications:    0.9 %  sodium chloride infusion, , Intravenous, PRN, Rai, Ripudeep K, MD, Last Rate: 10 mL/hr at 04/23/21 1937, New Bag at 04/23/21 1937   acetaminophen (TYLENOL) tablet 650 mg, 650 mg, Oral, Q6H PRN, 650 mg at 04/22/21 1507  **OR** acetaminophen (TYLENOL) suppository 650 mg, 650 mg, Rectal, Q6H PRN, [name], [name]   ceFEPIme (MAXIPIME) 2 g in sodium chloride 0.9 % 100 mL IVPB, 2 g, Intravenous, Q12H, Candiss Norse, [place], MD, Stopping Infusion hung by another clincian at 04/23/21 1938   Chlorhexidine Gluconate Cloth 2 % PADS 6 each, 6 each, Topical, Daily, Rai, Ripudeep K, MD, 6 each at 04/23/21 0759   diltiazem (CARDIZEM CD) 24 hr capsule 120 mg, 120 mg, Oral, Daily, [name], [name], 120 mg at 04/23/21 0813   guaiFENesin-dextromethorphan (ROBITUSSIN DM) 100-10 MG/5ML syrup 5 mL, 5 mL, Oral, Q4H PRN, [name], [name], 5 mL at 04/20/21 1523   hydrALAZINE (APRESOLINE) injection 5 mg, 5 mg, Intravenous, Q8H PRN, [name], [name]   HYDROcodone-acetaminophen (NORCO/VICODIN) 5-325 MG per tablet 1-2 tablet, 1-2 tablet, Oral, Q4H PRN, [name], [name], 2 tablet at 04/23/21 1504   insulin aspart (novoLOG) injection 0-15 Units, 0-15 Units, Subcutaneous, TID WC, Rai, Ripudeep K, MD, 3 Units at 04/23/21 1805   insulin aspart (novoLOG) injection 0-5 Units, 0-5 Units, Subcutaneous, QHS, Rai, Ripudeep K, MD   insulin glargine-yfgn (SEMGLEE) injection 24 Units, 24 Units, Subcutaneous, Daily, Rai, Ripudeep K, MD, 24 Units at 04/23/21 0817   ipratropium-albuterol (DUONEB) 0.5-2.5 (3) MG/3ML nebulizer solution 3 mL, 3 mL, Nebulization, Q6H PRN, [name], [name], 3 mL at 04/19/21 0153   polyethylene glycol (MIRALAX / GLYCOLAX) packet 17 g, 17 g, Oral, Daily, Rai, Ripudeep K, MD, 17 g at 04/23/21 0814   senna-docusate (Senokot-S) tablet 2 tablet, 2 tablet, Oral, Daily, Hall, Carole N, DO, 2  tablet at 04/23/21 0813   sodium chloride flush (NS) 0.9 % injection 10-40 mL, 10-40 mL, Intracatheter, Q12H, Rai, Ripudeep K, MD   sodium chloride flush (NS) 0.9 % injection 10-40 mL, 10-40 mL, Intracatheter, PRN, Rai, Ripudeep K, MD  Allergies  Allergen Reactions   Metformin Hcl Other (See Comments)    Unknown reaction    Pravastatin Sodium Other (See Comments)    Muscle pain      Objective: Today's Vitals   04/23/21 1505 04/23/21 1549 04/23/21 1700 04/23/21 1939  BP:   (!) 149/88   Pulse:   78   Resp:   18   Temp:   98.2 F (36.8 C)   TempSrc:      SpO2:   97%   Weight:      Height:      PainSc: 10-Worst pain ever 1   0-No pain    General: No acute distress  Right Lower extremity: Dressing to Right foot clean, dry, intact. No strikethrough noted, Upon removal of dressings sutures intact with no dehiscence. Decreased erythema, decreased edema, mild bloody drainage and dried blood to the surgical incision. No other acute signs of infection. No calf pain.   Left lower extremity: Localized necrotic wound to the left fourth toe that appears to be dry and stable with no significant redness warmth swelling or active drainage.  Patient has no pain to palpation to left fourth toe and is status post left first and second digital amputations.  MRI left foot IMPRESSION: 1. Soft tissue ulceration at the tip of the fourth toe extending to bone. Underlying osteomyelitis of the fourth distal phalanx. No abscess.    Assessment and Plan:  Problem List Items Addressed This Visit   None Visit Diagnoses     Sepsis, due to unspecified organism, unspecified whether acute organ dysfunction present ([place])    -  Primary   Dyspnea       Relevant Orders   DG CHEST PORT 1 VIEW (Completed)   Bacteremia       Relevant Orders   [place] RT LOWER EXTREM LTD SOFT TISSUE NON VASCULAR (Completed)   Diabetic ulcer of toe of right foot associated with type 2 diabetes mellitus, with fat layer exposed ([place])       Relevant Medications   insulin aspart (novoLOG) injection 10 Units (Completed)   insulin aspart (novoLOG) injection 5 Units (Completed)   insulin glargine-yfgn (SEMGLEE) injection 24 Units   insulin aspart (novoLOG) injection 0-5 Units   insulin aspart (novoLOG) injection 0-15 Units   Post-operative state        Relevant Orders   DG Foot 2 Views Right (Completed)   Hematoma           -Patient seen and evaluated at bedside -Chart reviewed -Dressing change preformed; applied dry dressing to right foot and left foot open to air and recommend Betadine application to the left fourth toe -Advised patient to make sure to keep dressing clean, dry, and intact to Right foot allowing nursing to change dressings as ordered on the right and left -Recommend continue with antibiotics per infectious disease and wound-surgery result -Weightbearing as tolerated with cam boot or surgical shoe -Continue with rest and elevation to assist with pain and edema control  -Recommend for the left fourth toe osteomyelitis to continue with close monitoring patient needs revascularization before any attempt to surgery can be made I have discussed this with patient's wife who also agrees and does not want has been to  have surgery until he is seen by vascular, Dr. [name] -At this time recommend continue with antibiotics and local wound care for the left fourth toe and may reconsider surgical options in the future or if the toe fails to continue to improve after seen by vascular -Podiatry will continue to follow closely   Landis Martins, [name] [facility] [phone] office [phone] cell Available via secure chat

## 2021-04-23 NOTE — Consult Note (Signed)
Chief Complaint   Chief Complaint  Patient presents with   Fever   Weakness   Shortness of Breath   Cough    Since 1/3 evening    History of Present Illness  Zachary [name] is a 76 y.o. male admitted to the hospital with fever and a chronic toe infection.  Patient has also been complaining of moderate back pain over the last several days.  Initially, he underwent amputation of the toe however was found to have MSSA bacteremia.  With his back pain MRI of the lumbar spine was ordered demonstrating presence of thoracolumbar epidural abscess.  Neurosurgical consultation was therefore requested.  Upon questioning, the patient does report moderate back pain although he says his toe pain is much more significant.  He does report some mild paresthesias in the left foot over the last day but otherwise no new weakness.  Past Medical History   Past Medical History:  Diagnosis Date   Diabetes mellitus    Hypercholesteremia    Hypertension    Patella fracture    left   PVD (peripheral vascular disease) ([place])     Past Surgical History   Past Surgical History:  Procedure Laterality Date   AMPUTATION     AMPUTATION  11/28/2011   Procedure: AMPUTATION RAY;  Surgeon: [name], MD;  Location: [facility];  Service: Orthopedics;  Laterality: Left;  left 2nd toe amputation   AMPUTATION TOE Right 09/14/2019   Procedure: AMPUTATION TOE RIGHT SECOND TOE;  Surgeon: [name], [name];  Location: [facility];  Service: Podiatry;  Laterality: Right;   AMPUTATION TOE Right 04/20/2021   Procedure: AMPUTATION GREAT TOE; APPLICATION OF SKIN GRAFT SUBSTITUTE;  Surgeon: [name], [name];  Location: [place];  Service: Podiatry;  Laterality: Right;   IR ANGIOGRAM EXTREMITY BILATERAL  03/03/2021   IR ANGIOGRAM EXTREMITY RIGHT  03/03/2021   IR ANGIOGRAM EXTREMITY RIGHT  04/14/2021   IR FEM POP ART PTA MOD SED  03/03/2021   IR INTRAVASCULAR ULTRASOUND NON CORONARY  03/03/2021   IR RADIOLOGIST EVAL & MGMT   09/27/2019   IR RADIOLOGIST EVAL & MGMT  12/18/2019   IR RADIOLOGIST EVAL & MGMT  02/11/2021   IR RADIOLOGIST EVAL & MGMT  04/09/2021   IR TIB-PERO ART ATHEREC INC PTA MOD SED  03/03/2021   IR TIB-PERO ART PTA MOD SED  04/14/2021   IR US GUIDE VASC ACCESS LEFT  03/03/2021   IR US GUIDE VASC ACCESS RIGHT  04/14/2021   ORIF PATELLA Left 01/10/2019   Procedure: OPEN REDUCTION INTERNAL (ORIF) FIXATION LEFT PATELLA FRACTURE;  Surgeon: [name], MD;  Location: [facility];  Service: Orthopedics;  Laterality: Left;    Social History   Social History   Tobacco Use   Smoking status: Never   Smokeless tobacco: Never  Substance Use Topics   Alcohol use: No   Drug use: No    Medications   Prior to Admission medications   Medication Sig Start Date End Date Taking? Authorizing Provider  acetaminophen (TYLENOL) 500 MG tablet Take 1,000 mg by mouth every 8 (eight) hours as needed for moderate pain.   Yes [provider]  aspirin EC 81 MG tablet Take 81 mg by mouth daily.   Yes [provider]  calcium carbonate (TUMS - DOSED IN MG ELEMENTAL CALCIUM) 500 MG chewable tablet Chew 1,000 mg by mouth daily as needed for indigestion or heartburn.   Yes [provider]  clopidogrel (  PLAVIX) 75 MG tablet Take 75 mg by mouth daily.   Yes [provider]  diltiazem (CARDIZEM CD) 120 MG 24 hr capsule Take 120 mg by mouth daily. 03/16/20  Yes [provider]  Insulin Glargine-Lixisenatide (SOLIQUA) 100-33 UNT-MCG/ML SOPN Inject 50 Units into the skin daily.   Yes [provider]  B-D ULTRAFINE III SHORT PEN 31G X 8 MM MISC Inject into the skin 5 (five) times daily. 11/26/19   [provider]  collagenase (SANTYL) ointment Apply 1 application topically daily. Patient not taking: Reported on 02/25/2021 01/15/21   [name], [name]  doxycycline (VIBRA-TABS) 100 MG tablet Take 1 tablet (100 mg total) by mouth 2 (two) times daily. Patient  not taking: Reported on 02/25/2021 01/15/21   [name], [name]  glucose blood (FREESTYLE LITE) test strip TEST 3 TIMES A DAY E11.9    [provider]  Lancets (FREESTYLE) lancets AS DIRECTED THREE TIMES A DAY    [provider]    Allergies   Allergies  Allergen Reactions   Metformin Hcl Other (See Comments)    Unknown reaction   Pravastatin Sodium Other (See Comments)    Muscle pain     Review of Systems  ROS  Neurologic Exam  Awake, alert, oriented Memory and concentration grossly intact Speech fluent, appropriate CN grossly intact Motor exam: Upper Extremities Deltoid Bicep Tricep Grip  Right 5/5 5/5 5/5 5/5  Left 5/5 5/5 5/5 5/5   Lower Extremities IP Quad PF DF EHL  Right 5/5 5/5 5/5 5/5 5/5  Left 5/5 5/5 5/5 5/5 5/5   Sensation grossly intact to LT  Imaging  MRI of the lumbar spine was personally reviewed.  This study is significantly motion degraded however there does appear to be a fluid collection dorsal to the thecal sac extending from approximately T12 down to L4-5.  There is mild to moderate stenosis in this region with moderate to severe stenosis in the lower lumbar spine.  Impression  - 76 y.o. 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  Thankfully, the patient remains neurologically intact.  I therefore would not recommend surgical decompression at this point but rather continued medical treatment of his infection.  Plan  -Continue IV antibiotics per infectious disease -Would recommend repeat MRI of the lumbar spine with and without contrast in 2 to 3 weeks -Continue to monitor neurologic exam  I did review the plan above with the patient.  While I did speak to him about the possibility of surgery, given the fact that he is neurologically intact I recommended continued medical treatment.  Patient agrees to this plan.  All his questions today were answered.   Consuella Lose, MD [facility]

## 2021-04-23 NOTE — TOC Progression Note (Signed)
Transition of Care [facility]) - Initial/Assessment Note    Patient Details  Name: Zachary [name] MRN: [id number] Date of Birth: 1945/04/29  [facility]) CM/SW Contact:    Milinda Antis, LCSWA Phone Number: 04/23/2021, 12:18 PM  Clinical Narrative:                 CSW presented the family with bed offers.  The family is reviewing.  Expected Discharge Plan: Skilled Nursing Facility Barriers to Discharge: Insurance Authorization, SNF Pending bed offer   Patient Goals and CMS Choice   CMS Medicare.gov Compare Post Acute Care list provided to:: Patient Choice offered to / list presented to : Patient  Expected Discharge Plan and Services Expected Discharge Plan: [place]       Living arrangements for the past 2 months: Single Family Home                                      Prior Living Arrangements/Services Living arrangements for the past 2 months: Single Family Home Lives with:: Self, Spouse Patient language and need for interpreter reviewed:: Yes Do you feel safe going back to the place where you live?: Yes      Need for Family Participation in Patient Care: Yes (Comment) Care giver support system in place?: Yes (comment)   Criminal Activity/Legal Involvement Pertinent to Current Situation/Hospitalization: No - Comment as needed  Activities of Daily Living Home Assistive Devices/Equipment: None ADL Screening (condition at time of admission) Patient's cognitive ability adequate to safely complete daily activities?: Yes Is the patient deaf or have difficulty hearing?: No Does the patient have difficulty seeing, even when wearing glasses/contacts?: No Does the patient have difficulty concentrating, remembering, or making decisions?: No Patient able to express need for assistance with ADLs?: Yes Does the patient have difficulty dressing or bathing?: No Independently performs ADLs?: Yes (appropriate for developmental age) Does the patient  have difficulty walking or climbing stairs?: Yes Weakness of Legs: Both Weakness of Arms/Hands: None  Permission Sought/Granted   Permission granted to share information with : Yes, Verbal Permission Granted     Permission granted to share info w AGENCY: SNF        Emotional Assessment Appearance:: Appears older than stated age Attitude/Demeanor/Rapport: Engaged Affect (typically observed): Pleasant, Adaptable Orientation: : Oriented to Self, Oriented to Place, Oriented to  Time, Oriented to Situation Alcohol / Substance Use: Not Applicable Psych Involvement: No (comment)  Admission diagnosis:  Bacteremia [R78.81] Dyspnea [R06.00] Osteomyelitis ([place]) [M86.9] Sepsis, due to unspecified organism, unspecified whether acute organ dysfunction present [facility]) [A41.9] Patient Active Problem List   Diagnosis Date Noted   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 coli UTI 04/21/2021   MSSA bacteremia 04/19/2021   Osteomyelitis of great toe of right foot ([place]) 04/18/2021   sepsis secondary to pneumonia vs. 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   Colonic polyp 03/24/2020   Diabetic renal disease ([place]) 03/24/2020   Elevated PSA 03/24/2020   Essential hypertension 03/24/2020   Familial hypercholesterolemia 03/24/2020   Headache 03/24/2020   Hyperlipidemia, unspecified 03/24/2020   Hyperlipidemia 03/24/2020   Long term (current) use of insulin ([place]) 03/24/2020   Non-pressure  chronic ulcer of right heel and midfoot with other specified severity ([place]) 03/24/2020   Stage 3 chronic kidney disease ([place]) 03/24/2020   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 ([place]) 02/72/5366   Gangrenous toe ([place])    Acute osteomyelitis ([place])  09/13/2019   Quadriceps tendon rupture, left, initial encounter 01/10/2019   Closed comminuted fracture of left patella, initial encounter 01/08/2019   PCP:  [name], FNP Pharmacy:   [id number] - [place], [place] [address] [place] [address] Phone: [phone] Fax: [place] [phone] - [place], [place] [address] Phone: [phone] Fax: [phone]     Social Determinants of Health (SDOH) Interventions    Readmission Risk Interventions No flowsheet data found.

## 2021-04-23 NOTE — Progress Notes (Signed)
[place] for Infectious Disease  Date of Admission:  04/18/2021     Total days of antibiotics 6         ASSESSMENT:  Mr. 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 Scheduled for TEE given new infectious findings increasing concern for endocarditis. Transitioned to cefepime with polymicrobial surgical findings and E. Coli resistant to Cefazolin. Neurosurgery recommending treatment with antibiotics and re-imaging. Awaiting further evaluation by Podiatry for potential surgical interventions for left foot. Blood cultures remain without growth to date. Continue current dose of cefepime. Remaining medical and supportive care per primary team.    PLAN:  Continue Cefepime. Monitor cultures for clearance of bacteremia. Await evaluation of left foot for surgical intervention by Podiatry.  TEE scheduled.  Remaining medical and supportive care per primary team.   Dr. Candiss Norse is available over the weekend for any ID related questions or concerns.   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 osteomyelitis of rigth great toe    Hyponatremia   E. coli UTI   Epidural abscess    Chlorhexidine Gluconate Cloth  6 each Topical Daily   diltiazem  120 mg Oral Daily   insulin aspart  0-15 Units Subcutaneous TID WC   insulin aspart  0-5 Units Subcutaneous QHS   insulin glargine-yfgn  24 Units Subcutaneous Daily   polyethylene glycol  17 g Oral Daily   senna-docusate  2 tablet Oral Daily   sodium chloride flush  10-40 mL Intracatheter  Q12H    SUBJECTIVE:  Afebrile overnight. Imaging with underlying osteomyelitis of the fourth distal phalanx and epidural fluid collection from T12-L4/L5 concerning for abscess. Feeling okay today with minimal back pain.   Allergies  Allergen Reactions   Metformin Hcl Other (See Comments)    Unknown reaction   Pravastatin Sodium Other (See Comments)    Muscle pain      Review of Systems: Review of Systems  Constitutional:  Negative for chills, fever and weight loss.  Respiratory:  Negative for cough, shortness of breath and wheezing.   Cardiovascular:  Negative for chest pain and leg swelling.  Gastrointestinal:  Negative for abdominal pain, constipation, diarrhea, nausea and vomiting.  Skin:  Negative for rash.     OBJECTIVE: Vitals:   04/22/21 2356 04/23/21 0502 04/23/21 0536 04/23/21 0812  BP: (!) 141/75 (!) 145/66  (!) 154/91  Pulse: 74 76  81  Resp: 19 19  20   Temp: 97.7 F (36.5 C) 98.2 F (36.8 C)  98 F (36.7 C)  TempSrc:  Oral  Oral  SpO2: 98% 95%  96%  Weight:   84.9 kg   Height:       Body mass index is 28.46 kg/m.  Physical Exam Constitutional:      General: He is not in acute distress.    Appearance: He is well-developed.  Cardiovascular:     Rate and Rhythm: Normal rate and regular rhythm.     Heart sounds: Normal heart sounds.  Pulmonary:     Effort: Pulmonary effort is normal.     Breath  sounds: Normal breath sounds.  Musculoskeletal:     Comments: Surgical dressing in place.   Skin:    General: Skin is warm and dry.  Neurological:     Mental Status: He is alert and oriented to person, place, and time.  Psychiatric:        Behavior: Behavior normal.        Thought Content: Thought content normal.        Judgment: Judgment normal.    Lab Results Lab Results  Component Value Date   WBC 12.8 (H) 04/21/2021   HGB 12.7 (L) 04/21/2021   HCT 36.8 (L) 04/21/2021   MCV 85.0 04/21/2021   PLT 269 04/21/2021    Lab Results  Component  Value Date   CREATININE 1.41 (H) 04/22/2021   BUN 24 (H) 04/22/2021   NA 132 (L) 04/22/2021   K 3.6 04/22/2021   CL 97 (L) 04/22/2021   CO2 26 04/22/2021    Lab Results  Component Value Date   ALT 9 04/22/2021   AST 19 04/22/2021   ALKPHOS 67 04/22/2021   BILITOT 0.3 04/22/2021     Microbiology: Recent Results (from the past 240 hour(s))  Resp Panel by RT-PCR (Flu A&B, Covid) Nasopharyngeal Swab     Status: None   Collection Time: 04/18/21  4:35 PM   Specimen: Nasopharyngeal Swab; Nasopharyngeal(NP) swabs in vial transport medium  Result Value Ref Range Status   SARS Coronavirus 2 by RT PCR NEGATIVE NEGATIVE Final    Comment: (NOTE) SARS-CoV-2 target nucleic acids are NOT DETECTED.  The SARS-CoV-2 RNA is generally detectable in upper respiratory specimens during the acute phase of infection. The lowest concentration of SARS-CoV-2 viral copies this assay can detect is 138 copies/mL. A negative result does not preclude SARS-Cov-2 infection and should not be used as the sole basis for treatment or other patient management decisions. A negative result may occur with  improper specimen collection/handling, submission of specimen other than nasopharyngeal swab, presence of viral mutation(s) within the areas targeted by this assay, and inadequate number of viral copies(<138 copies/mL). A negative result must be combined with clinical observations, patient history, and epidemiological information. The expected result is Negative.  Fact Sheet for Patients:  EntrepreneurPulse.com.au  Fact Sheet for Healthcare Providers:  IncredibleEmployment.be  This test is no t yet approved or cleared by the [place] FDA and  has been authorized for detection and/or diagnosis of SARS-CoV-2 by FDA under an Emergency Use Authorization (EUA). This EUA will remain  in effect (meaning this test can be used) for the duration of the COVID-19 declaration under  Section 564(b)(1) of the Act, 21 U.S.C.section 360bbb-3(b)(1), unless the authorization is terminated  or revoked sooner.       Influenza A by PCR NEGATIVE NEGATIVE Final   Influenza B by PCR NEGATIVE NEGATIVE Final    Comment: (NOTE) The Xpert Xpress SARS-CoV-2/FLU/RSV plus assay is intended as an aid in the diagnosis of influenza from Nasopharyngeal swab specimens and should not be used as a sole basis for treatment. Nasal washings and aspirates are unacceptable for Xpert Xpress SARS-CoV-2/FLU/RSV testing.  Fact Sheet for Patients: EntrepreneurPulse.com.au  Fact Sheet for Healthcare Providers: IncredibleEmployment.be  This test is not yet approved or cleared by the [place] FDA and has been authorized for detection and/or diagnosis of SARS-CoV-2 by FDA under an Emergency Use Authorization (EUA). This EUA will remain in effect (meaning this test can be used) for the duration of the COVID-19 declaration under Section 564(b)(1)  of the Act, 21 U.S.C. section 360bbb-3(b)(1), unless the authorization is terminated or revoked.  Performed at [facility], [place] [address], [place], [place] [address]   Urine Culture     Status: Abnormal   Collection Time: 04/18/21  4:35 PM   Specimen: In/Out Cath Urine  Result Value Ref Range Status   Specimen Description IN/OUT CATH URINE  Final   Special Requests   Final    NONE Performed at [facility], [place] [address] [place], [place] [address]    Culture >=100,000 COLONIES/mL ESCHERICHIA COLI (A)  Final   Report Status 04/21/2021 FINAL  Final   Organism ID, Bacteria ESCHERICHIA COLI (A)  Final      Susceptibility   Escherichia coli - MIC*    AMPICILLIN >=32 RESISTANT Resistant     CEFAZOLIN >=64 RESISTANT Resistant     CEFEPIME <=0.12 SENSITIVE Sensitive     CEFTRIAXONE <=0.25 SENSITIVE Sensitive     CIPROFLOXACIN <=0.25 SENSITIVE Sensitive     GENTAMICIN <=1 SENSITIVE Sensitive      IMIPENEM <=0.25 SENSITIVE Sensitive     NITROFURANTOIN <=16 SENSITIVE Sensitive     TRIMETH/SULFA <=20 SENSITIVE Sensitive     AMPICILLIN/SULBACTAM >=32 RESISTANT Resistant     PIP/TAZO <=4 SENSITIVE Sensitive     * >=100,000 COLONIES/mL ESCHERICHIA COLI  Blood Culture (routine x 2)     Status: Abnormal   Collection Time: 04/18/21  4:40 PM   Specimen: BLOOD RIGHT FOREARM  Result Value Ref Range Status   Specimen Description BLOOD RIGHT FOREARM  Final   Special Requests   Final    BOTTLES DRAWN AEROBIC AND ANAEROBIC Blood Culture results may not be optimal due to an excessive volume of blood received in culture bottles   Culture  Setup Time   Final    GRAM POSITIVE COCCI IN CLUSTERS IN BOTH AEROBIC AND ANAEROBIC BOTTLES CRITICAL VALUE NOTED.  VALUE IS CONSISTENT WITH PREVIOUSLY REPORTED AND CALLED VALUE.    Culture (A)  Final    STAPHYLOCOCCUS AUREUS SUSCEPTIBILITIES PERFORMED ON PREVIOUS CULTURE WITHIN THE LAST 5 DAYS. Performed at [facility], [place] [address], [place], [place] [address]    Report Status 04/21/2021 FINAL  Final  Blood Culture (routine x 2)     Status: Abnormal   Collection Time: 04/18/21  5:10 PM   Specimen: BLOOD RIGHT HAND  Result Value Ref Range Status   Specimen Description BLOOD RIGHT HAND  Final   Special Requests   Final    BOTTLES DRAWN AEROBIC AND ANAEROBIC Blood Culture results may not be optimal due to an inadequate volume of blood received in culture bottles   Culture  Setup Time   Final    GRAM POSITIVE COCCI IN CLUSTERS IN BOTH AEROBIC AND ANAEROBIC BOTTLES CRITICAL RESULT CALLED TO, READ BACK BY AND VERIFIED WITH: Carla Drape [id number] AT 36 BY CM Performed at [facility], [place] [address], [place], [place] [address]    Culture STAPHYLOCOCCUS AUREUS (A)  Final   Report Status 04/21/2021 FINAL  Final   Organism ID, Bacteria STAPHYLOCOCCUS AUREUS  Final      Susceptibility   Staphylococcus aureus - MIC*    CIPROFLOXACIN <=0.5  SENSITIVE Sensitive     ERYTHROMYCIN <=0.25 SENSITIVE Sensitive     GENTAMICIN <=0.5 SENSITIVE Sensitive     OXACILLIN <=0.25 SENSITIVE Sensitive     TETRACYCLINE <=1 SENSITIVE Sensitive     VANCOMYCIN <=0.5 SENSITIVE Sensitive  TRIMETH/SULFA <=10 SENSITIVE Sensitive     CLINDAMYCIN <=0.25 SENSITIVE Sensitive     RIFAMPIN <=0.5 SENSITIVE Sensitive     Inducible Clindamycin NEGATIVE Sensitive     * STAPHYLOCOCCUS AUREUS  Blood Culture ID Panel (Reflexed)     Status: Abnormal   Collection Time: 04/18/21  5:10 PM  Result Value Ref Range Status   Enterococcus faecalis NOT DETECTED NOT DETECTED Final   Enterococcus Faecium NOT DETECTED NOT DETECTED Final   Listeria monocytogenes NOT DETECTED NOT DETECTED Final   Staphylococcus species DETECTED (A) NOT DETECTED Final    Comment: CRITICAL RESULT CALLED TO, READ BACK BY AND VERIFIED WITH: PHARMD C JACKSON [id number] AT 1037 BY CM    Staphylococcus aureus (BCID) DETECTED (A) NOT DETECTED Final    Comment: CRITICAL RESULT CALLED TO, READ BACK BY AND VERIFIED WITH: PHARMD C JACKSON [id number] AT 1037 BY CM    Staphylococcus epidermidis NOT DETECTED NOT DETECTED Final   Staphylococcus lugdunensis NOT DETECTED NOT DETECTED Final   Streptococcus species NOT DETECTED NOT DETECTED Final   Streptococcus agalactiae NOT DETECTED NOT DETECTED Final   Streptococcus pneumoniae NOT DETECTED NOT DETECTED Final   Streptococcus pyogenes NOT DETECTED NOT DETECTED Final   A.calcoaceticus-baumannii NOT DETECTED NOT DETECTED Final   Bacteroides fragilis NOT DETECTED NOT DETECTED Final   Enterobacterales NOT DETECTED NOT DETECTED Final   Enterobacter cloacae complex NOT DETECTED NOT DETECTED Final   Escherichia coli NOT DETECTED NOT DETECTED Final   Klebsiella aerogenes NOT DETECTED NOT DETECTED Final   Klebsiella oxytoca NOT DETECTED NOT DETECTED Final   Klebsiella pneumoniae NOT DETECTED NOT DETECTED Final   Proteus species NOT DETECTED NOT DETECTED Final    Salmonella species NOT DETECTED NOT DETECTED Final   Serratia marcescens NOT DETECTED NOT DETECTED Final   Haemophilus influenzae NOT DETECTED NOT DETECTED Final   Neisseria meningitidis NOT DETECTED NOT DETECTED Final   Pseudomonas aeruginosa NOT DETECTED NOT DETECTED Final   Stenotrophomonas maltophilia NOT DETECTED NOT DETECTED Final   Candida albicans NOT DETECTED NOT DETECTED Final   Candida auris NOT DETECTED NOT DETECTED Final   Candida glabrata NOT DETECTED NOT DETECTED Final   Candida krusei NOT DETECTED NOT DETECTED Final   Candida parapsilosis NOT DETECTED NOT DETECTED Final   Candida tropicalis NOT DETECTED NOT DETECTED Final   Cryptococcus neoformans/gattii NOT DETECTED NOT DETECTED Final   Meth resistant mecA/C and MREJ NOT DETECTED NOT DETECTED Final    Comment: Performed at [facility], [address] [place], [place] [address]  Respiratory (~20 pathogens) panel by PCR     Status: None   Collection Time: 04/18/21  8:09 PM   Specimen: Nasopharyngeal Swab; Respiratory  Result Value Ref Range Status   Adenovirus NOT DETECTED NOT DETECTED Final   Coronavirus 229E NOT DETECTED NOT DETECTED Final    Comment: (NOTE) The Coronavirus on the Respiratory Panel, DOES NOT test for the novel  Coronavirus (2019 nCoV)    Coronavirus HKU1 NOT DETECTED NOT DETECTED Final   Coronavirus NL63 NOT DETECTED NOT DETECTED Final   Coronavirus OC43 NOT DETECTED NOT DETECTED Final   Metapneumovirus NOT DETECTED NOT DETECTED Final   Rhinovirus / Enterovirus NOT DETECTED NOT DETECTED Final   Influenza A NOT DETECTED NOT DETECTED Final   Influenza B NOT DETECTED NOT DETECTED Final   Parainfluenza Virus 1 NOT DETECTED NOT DETECTED Final   Parainfluenza Virus 2 NOT DETECTED NOT DETECTED Final   Parainfluenza Virus 3 NOT DETECTED NOT DETECTED Final  Parainfluenza Virus 4 NOT DETECTED NOT DETECTED Final   Respiratory Syncytial Virus NOT DETECTED NOT DETECTED Final   Bordetella pertussis  NOT DETECTED NOT DETECTED Final   Bordetella Parapertussis NOT DETECTED NOT DETECTED Final   Chlamydophila pneumoniae NOT DETECTED NOT DETECTED Final   Mycoplasma pneumoniae NOT DETECTED NOT DETECTED Final    Comment: Performed at [facility], [place] [address] [place], [place] [address]  MRSA Next Gen by PCR, Nasal     Status: None   Collection Time: 04/19/21  7:00 AM   Specimen: Nasal Mucosa; Nasal Swab  Result Value Ref Range Status   MRSA by PCR Next Gen NOT DETECTED NOT DETECTED Final    Comment: (NOTE) The GeneXpert MRSA Assay (FDA approved for NASAL specimens only), is one component of a comprehensive MRSA colonization surveillance program. It is not intended to diagnose MRSA infection nor to guide or monitor treatment for MRSA infections. Test performance is not FDA approved in patients less than 20 years old. Performed at [facility], [place] [address] [place], [place] [address]   Culture, blood (routine x 2)     Status: None (Preliminary result)   Collection Time: 04/19/21  8:41 PM   Specimen: BLOOD RIGHT HAND  Result Value Ref Range Status   Specimen Description BLOOD RIGHT HAND  Final   Special Requests   Final    BOTTLES DRAWN AEROBIC AND ANAEROBIC Blood Culture adequate volume   Culture   Final    NO GROWTH 4 DAYS Performed at [facility], [place] [address], [place], [place] [address]    Report Status PENDING  Incomplete  Culture, blood (routine x 2)     Status: None (Preliminary result)   Collection Time: 04/19/21  8:41 PM   Specimen: BLOOD  Result Value Ref Range Status   Specimen Description BLOOD LEFT ARM  Final   Special Requests   Final    BOTTLES DRAWN AEROBIC AND ANAEROBIC Blood Culture adequate volume   Culture   Final    NO GROWTH 4 DAYS Performed at [facility], [place] [address], [place], [place] [address]    Report Status PENDING  Incomplete  Surgical pcr screen     Status: None   Collection Time: 04/20/21  5:42 AM   Specimen:  Nasal Mucosa; Nasal Swab  Result Value Ref Range Status   MRSA, PCR NEGATIVE NEGATIVE Final   Staphylococcus aureus NEGATIVE NEGATIVE Final    Comment: (NOTE) The Xpert SA Assay (FDA approved for NASAL specimens in patients 66 years of age and older), is one component of a comprehensive surveillance program. It is not intended to diagnose infection nor to guide or monitor treatment. Performed at [facility], [place] [address], [place], [place] [address]   Culture, fungus without smear     Status: None (Preliminary result)   Collection Time: 04/20/21 10:44 AM   Specimen: Toe, Right; Amputation  Result Value Ref Range Status   Specimen Description TOE  Final   Special Requests RIGHT TOE  Final   Culture   Final    NO FUNGUS ISOLATED AFTER 2 DAYS Performed at [facility], [address], [place], [place] [address]    Report Status PENDING  Incomplete  Aerobic/Anaerobic Culture w Gram Stain (surgical/deep wound)     Status: None (Preliminary result)   Collection Time: 04/20/21 10:44 AM   Specimen: Toe, Right; Amputation  Result Value Ref Range Status  Specimen Description TOE  Final   Special Requests RIGHT TOE  Final   Gram Stain   Final    RARE WBC PRESENT, PREDOMINANTLY MONONUCLEAR MODERATE GRAM POSITIVE COCCI RARE GRAM NEGATIVE RODS    Culture   Final    FEW STAPHYLOCOCCUS AUREUS MODERATE ESCHERICHIA COLI MODERATE KLEBSIELLA OXYTOCA NO ANAEROBES ISOLATED; CULTURE IN PROGRESS FOR 5 DAYS SUSCEPTIBILITIES TO FOLLOW Performed at [facility], [place] [address], [place], [place] [address]    Report Status PENDING  Incomplete   Organism ID, Bacteria ESCHERICHIA COLI  Final   Organism ID, Bacteria KLEBSIELLA OXYTOCA  Final      Susceptibility   Escherichia coli - MIC*    AMPICILLIN >=32 RESISTANT Resistant     CEFAZOLIN >=64 RESISTANT Resistant     CEFEPIME <=0.12 SENSITIVE Sensitive     CEFTAZIDIME <=1 SENSITIVE Sensitive     CEFTRIAXONE 0.5 SENSITIVE  Sensitive     CIPROFLOXACIN <=0.25 SENSITIVE Sensitive     GENTAMICIN <=1 SENSITIVE Sensitive     IMIPENEM <=0.25 SENSITIVE Sensitive     TRIMETH/SULFA <=20 SENSITIVE Sensitive     AMPICILLIN/SULBACTAM >=32 RESISTANT Resistant     PIP/TAZO 16 SENSITIVE Sensitive     * MODERATE ESCHERICHIA COLI   Klebsiella oxytoca - MIC*    AMPICILLIN >=32 RESISTANT Resistant     CEFAZOLIN 8 SENSITIVE Sensitive     CEFEPIME <=0.12 SENSITIVE Sensitive     CEFTAZIDIME <=1 SENSITIVE Sensitive     CEFTRIAXONE <=0.25 SENSITIVE Sensitive     CIPROFLOXACIN <=0.25 SENSITIVE Sensitive     GENTAMICIN <=1 SENSITIVE Sensitive     IMIPENEM <=0.25 SENSITIVE Sensitive     TRIMETH/SULFA <=20 SENSITIVE Sensitive     AMPICILLIN/SULBACTAM 8 SENSITIVE Sensitive     PIP/TAZO <=4 SENSITIVE Sensitive     * MODERATE KLEBSIELLA OXYTOCA     Terri Piedra, NP [facility]  04/23/2021  11:44 AM

## 2021-04-24 DIAGNOSIS — R0600 Dyspnea, unspecified: Secondary | ICD-10-CM

## 2021-04-24 LAB — CULTURE, BLOOD (ROUTINE X 2)
Culture: NO GROWTH
Culture: NO GROWTH
Special Requests: ADEQUATE
Special Requests: ADEQUATE

## 2021-04-24 LAB — GLUCOSE, CAPILLARY
Glucose-Capillary: 157 mg/dL — ABNORMAL HIGH (ref 70–99)
Glucose-Capillary: 163 mg/dL — ABNORMAL HIGH (ref 70–99)
Glucose-Capillary: 170 mg/dL — ABNORMAL HIGH (ref 70–99)
Glucose-Capillary: 190 mg/dL — ABNORMAL HIGH (ref 70–99)

## 2021-04-24 MED ORDER — SENNOSIDES-DOCUSATE SODIUM 8.6-50 MG PO TABS
2.0000 | ORAL_TABLET | Freq: Two times a day (BID) | ORAL | Status: DC
  Administered 2021-04-24 – 2021-04-29 (×10): 2 via ORAL
  Filled 2021-04-24 (×11): qty 2

## 2021-04-24 MED ORDER — BISACODYL 5 MG PO TBEC
5.0000 mg | DELAYED_RELEASE_TABLET | Freq: Once | ORAL | Status: AC
  Administered 2021-04-24: 5 mg via ORAL
  Filled 2021-04-24: qty 1

## 2021-04-24 NOTE — Progress Notes (Signed)
Subjective: Zachary [name] is a 76 y.o. male patient seen at bedside, resting comfortably in no acute distress s/p day #4 Right great toe amputation.  Patient is also being seen for follow up evaluation of left fourth toe osteomyelitis. Patient denies pain or constitutional symptoms at this time.  No other issues noted.   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 coli UTI 04/21/2021   MSSA bacteremia 04/19/2021   Osteomyelitis of great toe of right foot ([place]) 04/18/2021   sepsis secondary to pneumonia vs. 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   Headache 03/24/2020   Hyperlipidemia, unspecified 03/24/2020   Hyperlipidemia 03/24/2020   Long term (current) use of insulin ([place]) 03/24/2020   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 ([place]) 03/24/2020   Stage 3 chronic kidney disease ([place]) 03/24/2020   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 ([place]) 24/23/5361   Gangrenous toe ([place])    Acute osteomyelitis ([place]) 09/13/2019   Quadriceps tendon rupture, left, initial encounter 01/10/2019   Closed comminuted fracture of left patella, initial encounter 01/08/2019     Current Facility-Administered Medications:    0.9 %  sodium chloride infusion, , Intravenous, PRN, Rai, Ripudeep K, MD, Last Rate: 10 mL/hr at 04/23/21 1937, New Bag at 04/23/21 1937   acetaminophen (TYLENOL) tablet 650 mg, 650 mg, Oral, Q6H PRN, 650 mg at 04/22/21 1507 **OR** acetaminophen (TYLENOL)  suppository 650 mg, 650 mg, Rectal, Q6H PRN, [name], [name]   ceFEPIme (MAXIPIME) 2 g in sodium chloride 0.9 % 100 mL IVPB, 2 g, Intravenous, Q12H, Laurice Record, MD, Last Rate: 200 mL/hr at 04/24/21 0835, 2 g at 04/24/21 0835   Chlorhexidine Gluconate Cloth 2 % PADS 6 each, 6 each, Topical, Daily, Rai, Ripudeep K, MD, 6 each at 04/23/21 0759   diltiazem (CARDIZEM CD) 24 hr capsule 120 mg, 120 mg, Oral, Daily, [name], [name], 120 mg at 04/24/21 0825   guaiFENesin-dextromethorphan (ROBITUSSIN DM) 100-10 MG/5ML syrup 5 mL, 5 mL, Oral, Q4H PRN, [name], [name], 5 mL at 04/20/21 1523   hydrALAZINE (APRESOLINE) injection 5 mg, 5 mg, Intravenous, Q8H PRN, [name], [name]   HYDROcodone-acetaminophen (NORCO/VICODIN) 5-325 MG per tablet 1-2 tablet, 1-2 tablet, Oral, Q4H PRN, [name], [name], 2 tablet at 04/24/21 1600   insulin aspart (novoLOG) injection 0-15 Units, 0-15 Units, Subcutaneous, TID WC, Rai, Ripudeep K, MD, 3 Units at 04/24/21 1148   insulin aspart (novoLOG) injection 0-5 Units, 0-5 Units, Subcutaneous, QHS, Rai, Ripudeep K, MD   insulin glargine-yfgn (SEMGLEE) injection 24 Units, 24 Units, Subcutaneous, Daily, Rai, Ripudeep K, MD, 24 Units at 04/24/21 0826   ipratropium-albuterol (DUONEB) 0.5-2.5 (3) MG/3ML nebulizer solution 3 mL, 3 mL, Nebulization, Q6H PRN, [name], [name], 3 mL at 04/19/21 0153   polyethylene glycol (MIRALAX / GLYCOLAX) packet 17 g, 17 g, Oral, Daily, Rai, Ripudeep K, MD, 17 g at 04/24/21 0825   senna-docusate (Senokot-S) tablet 2 tablet, 2 tablet, Oral, BID, Rai, Ripudeep K, MD   sodium  chloride flush (NS) 0.9 % injection 10-40 mL, 10-40 mL, Intracatheter, Q12H, Rai, Ripudeep K, MD, 10 mL at 04/24/21 0835   sodium chloride flush (NS) 0.9 % injection 10-40 mL, 10-40 mL, Intracatheter, PRN, Rai, Ripudeep K, MD  Allergies  Allergen Reactions   Metformin Hcl Other (See Comments)    Unknown reaction   Pravastatin Sodium Other (See  Comments)    Muscle pain      Objective: Today's Vitals   04/24/21 1048 04/24/21 1133 04/24/21 1600 04/24/21 1752  BP:    133/74  Pulse:    82  Resp:    16  Temp:    98.2 F (36.8 C)  TempSrc:      SpO2:    94%  Weight:      Height:      PainSc: 6  1  10-Worst pain ever     General: No acute distress  Right Lower extremity: Dressing to Right foot clean, dry, intact. No strikethrough noted.  Left lower extremity: Unchanged, Localized necrotic wound to the left fourth toe that appears to be dry and stable with no significant redness warmth swelling or active drainage. No acute changes since yesterday's exam.     Assessment and Plan:  Problem List Items Addressed This Visit   None Visit Diagnoses     Sepsis, due to unspecified organism, unspecified whether acute organ dysfunction present ([place])    -  Primary   Dyspnea       Relevant Orders   DG CHEST PORT 1 VIEW (Completed)   Bacteremia       Relevant Orders   [place] RT LOWER EXTREM LTD SOFT TISSUE NON VASCULAR (Completed)   Diabetic ulcer of toe of right foot associated with type 2 diabetes mellitus, with fat layer exposed ([place])       Relevant Medications   insulin aspart (novoLOG) injection 10 Units (Completed)   insulin aspart (novoLOG) injection 5 Units (Completed)   insulin glargine-yfgn (SEMGLEE) injection 24 Units   insulin aspart (novoLOG) injection 0-5 Units   insulin aspart (novoLOG) injection 0-15 Units   Post-operative state       Relevant Orders   DG Foot 2 Views Right (Completed)   Hematoma           -Patient seen and evaluated at bedside -Chart reviewed -Dressing clean, dry and intact to the right -Clean sock is present on the left -Recommend continue with antibiotics per infectious disease  -Weightbearing as tolerated with cam boot or surgical shoe of which he has at home -Continue with rest and elevation to assist with pain and edema control  -I discussed case with Interventional vascular, Dr.  [name] -At this time recommend continue with antibiotics and local wound care for the left fourth toe and may reconsider surgical options in the future or if the toe fails to continue to improve after seen by vascular; Dr [name]  -Podiatry will continue to follow closely and as scheduled in office after discharge   Landis Martins, [name] [facility] [phone] office [phone] cell Available via secure chat

## 2021-04-24 NOTE — Progress Notes (Signed)
Progress Note   Patient: Zachary [name] WGY:[id number] DOB: 20-Aug-1945 DOA: 04/18/2021     5 DOS: the patient was seen and examined on 04/24/2021   Brief hospital course: Patient is a 76 year old male with a history of type 2 diabetes mellitus, HTN, HLD, PVD, BPH presented with bilateral leg pain.He had  aorto-peripheral angiogram on 04/14/21 by IR. After the procedure he reportedly started to have pain in his legs. No bleeding or drainage from procedure site. He also has had right lower back pain for the past few days as well.  Work-up revealed osteomyelitis of both proximal and distal phalanx of the great toe seen on MRI 04/18/2021.   Underwent right great toe amputation on 1/10, podiatry following  Hospital course complicated by MSSA bacteremia, E. coli UTI.  ID consulted Surgical cultures with E. 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  Neurosurgery consulted MRI left foot: Osteomyelitis of the left fourth distal phalanx, podiatry reconsulted Cardiology consulted for TEE, planned on Monday  Assessment and Plan * MSSA bacteremia - Hospital course complicated by MSSA bacteremia, POA -ID consulted.  Switched to cefazolin on 04/19/2021 -2D echo showed EF 40 to 45%, no vegetation. -Given new MRI findings of epidural abscess, osteomyelitis fourth toe left foot, will proceed with TEE.  -Consulted cardiology, TEE planned on Monday, 1/16   Epidural abscess- (present on admission) - MRI of the lumbar spine showed epidural abscess extending from T12-L4 L5, in the setting of MSSA bacteremia - Neurosurgery, Dr. Kathyrn Sheriff recommended continue IV antibiotics and repeat MRI of the lumbar spine w/wo contrast in 2-3 weeks.  Currently no plan for surgical intervention. -Continue pain control, no new FND's  Osteomyelitis of great toe of right foot ([place])- (present on admission) - Underwent amputation of the right great toe, application of skin graft substitute on  1/10 - stable from podiatry standpoint, recommended to leave dressing intact until follow-up outpatient  - Blood cultures 1/9 NTD -Surgical cultures positive for E. 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 Cannon Kettle on 1/13, recommended conservative management and wound care for now  Peripheral vascular disease ([place])- (present on admission) -Recent angiogram to right leg on 04/14/21 by IR, no signs of infection at incision site -Has had progressive pain in both legs, Dr Rogers Blocker had discussed with IR, Dr. Annamaria Boots, -Holding plavix/ASA until cleared by podiatry to resume -Refuses statin.  Outpatient follow-up with IR  E. coli UTI- (present on admission) UA positive for pyuria, urine culture on 1/8 showed more than 100,000 colonies of E. Coli. -Currently on IV cefazolin.  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 -Also had anion gap metabolic acidosis likely due CKD.  Patient was placed on IV gentle hydration.  Gap closed. -Creatinine stable at baseline  Essential hypertension- (present on admission) - Stable, continue hydralazine IV as needed with  continue Cardizem ° °Hyperlipidemia- (present on admission) °-Declined statin ° °sepsis secondary to pneumonia vs. osteomyelitis of rigth great toe - (present on admission) °- Met  sepsis criteria at the time of admission with leukocytosis, tachycardia, tachypnea, source gangrenous right toe with osteomyelitis.  °- -initially given vanc/cefepime and flagyl, changed to vanc/rocephin and Zithromax to cover for osteo °- Transitioned to IV cefazolin on 04/19/2021 for MSSA bacteremia °-Underwent right great toe amputation on 1/10. ° ° °Subjective: Pain controlled, no acute complaints at this time, still has constipation.  No fevers or chills. ° °Objective °Vitals:  ° 04/24/21 0632 04/24/21 0824  °BP: (!) 166/78 118/70  °Pulse: 86 82  °Resp: 16 18  °Temp: 98.1 °F (36.7 °C) 98 °F (36.7 °C)  °SpO2: 94% 97%  °  °Physical Exam °General: Alert and oriented x 3, NAD °Cardiovascular: S1 S2 clear, RRR. No pedal edema b/l °Respiratory: CTAB, no wheezing °Gastrointestinal: Soft, nontender, nondistended, NBS °Ext: right foot surgical dressing in place °Neuro: no new deficits ° ° ° °Data Reviewed: ° °My review of labs, imaging, notes and other tests shows no new significant findings.  ° °Family Communication: Discussed with patient's family at the bedside on 1/13 ° °Disposition: °Status is: Inpatient ° °Remains inpatient appropriate because: TEE planned on 1/16 ° ° ° ° °Time spent: 35 minutes ° °Author: ° , MD °04/24/2021 2:04 PM ° °For on call review [email or web address].  ° °

## 2021-04-24 NOTE — TOC Progression Note (Signed)
Transition of Care [facility]) - Progression Note    Patient Details  Name: ARISTOTLE LIEB MRN: [id number] Date of Birth: 08/31/1945  [facility]) CM/SW Contact  Reece Agar, [place] Phone Number: 04/24/2021, 11:49 AM  Clinical Narrative:    CSW spoke with family in room to follow up on SNF choice. Pt son asked to pursue [facility]. CSW reached out to [facility] to confirm bed offer and contacted MD for estimated DC date. [facility] will [facility] a bed on Monday and MD is expecting to DC either Tuesday or Wednesday. CSW will start auth on Monday.    Expected Discharge Plan: Skilled Nursing Facility Barriers to Discharge: Ship broker, SNF Pending bed offer  Expected Discharge Plan and Services Expected Discharge Plan: [place] arrangements for the past 2 months: Single Family Home                                       Social Determinants of Health (SDOH) Interventions    Readmission Risk Interventions No flowsheet data found.

## 2021-04-25 DIAGNOSIS — R0602 Shortness of breath: Secondary | ICD-10-CM

## 2021-04-25 DIAGNOSIS — K5900 Constipation, unspecified: Secondary | ICD-10-CM | POA: Diagnosis present

## 2021-04-25 LAB — AEROBIC/ANAEROBIC CULTURE W GRAM STAIN (SURGICAL/DEEP WOUND)

## 2021-04-25 LAB — GLUCOSE, CAPILLARY
Glucose-Capillary: 197 mg/dL — ABNORMAL HIGH (ref 70–99)
Glucose-Capillary: 199 mg/dL — ABNORMAL HIGH (ref 70–99)
Glucose-Capillary: 156 mg/dL — ABNORMAL HIGH (ref 70–99)
Glucose-Capillary: 122 mg/dL — ABNORMAL HIGH (ref 70–99)

## 2021-04-25 MED ORDER — SORBITOL 70 % SOLN
960.0000 mL | TOPICAL_OIL | Freq: Once | ORAL | Status: AC
  Administered 2021-04-25: 960 mL via RECTAL
  Filled 2021-04-25: qty 473

## 2021-04-25 MED ORDER — CLOPIDOGREL BISULFATE 75 MG PO TABS
75.0000 mg | ORAL_TABLET | Freq: Every day | ORAL | Status: DC
  Administered 2021-04-25 – 2021-04-29 (×5): 75 mg via ORAL
  Filled 2021-04-25 (×5): qty 1

## 2021-04-25 MED ORDER — ENOXAPARIN SODIUM 40 MG/0.4ML IJ SOSY
40.0000 mg | PREFILLED_SYRINGE | INTRAMUSCULAR | Status: DC
  Administered 2021-04-25 – 2021-04-29 (×5): 40 mg via SUBCUTANEOUS
  Filled 2021-04-25 (×5): qty 0.4

## 2021-04-25 MED ORDER — SODIUM CHLORIDE 0.9 % IV SOLN: INTRAVENOUS | Status: DC

## 2021-04-25 MED ORDER — ASPIRIN EC 81 MG PO TBEC
81.0000 mg | DELAYED_RELEASE_TABLET | Freq: Every day | ORAL | Status: DC
  Administered 2021-04-25 – 2021-04-29 (×5): 81 mg via ORAL
  Filled 2021-04-25 (×5): qty 1

## 2021-04-25 NOTE — Progress Notes (Addendum)
° ° °  [facility] has been requested to perform a transesophageal echocardiogram on this patient for bacteremia.  After careful review of history and examination, the risks and benefits of transesophageal echocardiogram have been explained including risks of esophageal damage, perforation (1:10,000 risk), bleeding, pharyngeal hematoma as well as other potential complications associated with anesthesia including aspiration, arrhythmia, respiratory failure and death. Alternatives to treatment were discussed, questions were answered. Patient is willing to proceed. Scheduled tomorrow at 1pm with Dr. Audie Box, TEE orders written. Since he will be NPO after midnight I sent message to Dr. Tana Coast to review/adjust his insulin regimen if felt necessary; she will address.  Charlie Pitter, PA-C 04/25/2021 8:42 AM

## 2021-04-25 NOTE — Progress Notes (Signed)
Progress Note   Patient: Zachary [name] [id number]:[id number] DOB: 06-Dec-1945 DOA: 04/18/2021     6 DOS: the patient was seen and examined on 04/25/2021   Brief hospital course: Patient is a 76 year old male with a history of type 2 diabetes mellitus, HTN, HLD, PVD, BPH presented with bilateral leg pain.He had  aorto-peripheral angiogram on 04/14/21 by IR. After the procedure he reportedly started to have pain in his legs. No bleeding or drainage from procedure site. He also has had right lower back pain for the past few days as well.  Work-up revealed osteomyelitis of both proximal and distal phalanx of the great toe seen on MRI 04/18/2021.   Underwent right great toe amputation on 1/10, podiatry following  Hospital course complicated by MSSA bacteremia, E. coli UTI.  ID consulted Surgical cultures with E. 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  Neurosurgery consulted MRI left foot: Osteomyelitis of the left fourth distal phalanx, podiatry reconsulted Cardiology consulted for TEE, planned on Monday  Assessment and Plan * MSSA bacteremia - Hospital course complicated by MSSA bacteremia, POA -ID consulted.  Switched to cefazolin on 04/19/2021 -2D echo showed EF 40 to 45%, no vegetation. -Given new MRI findings of epidural abscess, osteomyelitis fourth toe left foot, will proceed with TEE.  -Consulted cardiology, TEE planned on Monday, 1/16, n.p.o. 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 Kathyrn Sheriff recommended continue IV antibiotics and repeat MRI of the lumbar spine w/wo contrast in 2-3 weeks.  Currently no plan for surgical intervention. -Continue pain control  Osteomyelitis of great toe of right foot ([place])- (present on admission) - Underwent amputation of the right great toe, application of skin graft substitute  on 1/10 - stable from podiatry standpoint, recommended to leave dressing intact until follow-up outpatient  - Blood cultures 1/9 NTD -Surgical cultures positive for E. 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 Cannon Kettle on 1/13, recommended conservative management and wound care for now  Peripheral vascular disease ([place])- (present on admission) -Recent angiogram to right leg on 04/14/21 by IR, no signs of infection at incision site -Has had progressive pain in both legs, Dr Rogers Blocker had discussed with IR, Dr. Annamaria Boots -- Does not appear to have any pending podiatry procedures, will resume aspirin and Plavix. -Refuses statin.  Outpatient follow-up with IR  E. coli UTI- (present on admission) UA positive for pyuria, urine culture on 1/8 showed more than 100,000 colonies of E. Coli. -Currently on IV cefepime  Hyponatremia- (present on admission) - Sodium improving, urine sodium 621, likely SIADH, hold off on IV fluids   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 ([name])- (present on admission) -  hemoglobin A1c 8.5 on 1/10  -On Semglee 24 units daily (has already received it this morning), sliding scale insulin moderate  -Follow CBGs, will be n.p.o. after midnight.  If any hypoglycemia, will place on D5 in am  Recent Labs    04/23/21 2137 04/24/21 0636 04/24/21 1122 04/24/21 1637 04/24/21 2053 04/25/21 0629  GLUCAP 184* 157* 163* 170* 190* 197*    Stage 3 chronic kidney disease ([name])- (present on admission) - CKD stage IIIb, baseline1.4-1.8. -Also had anion gap metabolic acidosis likely due CKD.  Patient was placed on IV gentle hydration.  Gap closed. -Obtain BMET in a.m., creatinine resolving and has been at baseline  Essential hypertension- (present on admission) - BP stable   Constipation- (present on admission) - Still feels constipated, no BM in  the last 3 to 4 days despite stool softeners, will give smog enema x1  Hyperlipidemia- (present on admission) -Declined statin  sepsis secondary to pneumonia vs. osteomyelitis of rigth great toe - (present on admission) - Met sepsis criteria at the time of admission with leukocytosis, tachycardia, tachypnea, source gangrenous right toe with osteomyelitis.  - -initially given vanc/cefepime and flagyl, changed to vanc/rocephin and Zithromax to cover for osteo - then transitioned to IV cefazolin on 1/9 for MSSA bacteremia, currently on IV cefepime to cover Klebsiella, E. coli in the surgical cultures - Underwent right great toe amputation on 1/10.     Subjective: Feels constipated, no BM in the last 3 to 4 days.  Back pain is controlled  Objective Vitals:   04/25/21 0502 04/25/21 0851  BP: (!) 165/76 119/79  Pulse: 89 88  Resp: 17 18  Temp: 98 F (36.7 C) 98.4 F (36.9 C)  SpO2: 95% 96%     Physical Exam General: Alert and oriented x 3, NAD Cardiovascular: S1 S2 clear, RRR. No pedal edema b/l Respiratory: CTAB, no wheezing, rales or rhonchi Gastrointestinal: Soft, nontender, nondistended, NBS Ext: right foot dressing intact Neuro: no new deficits   Data Reviewed:  There are no new results to review at this time.  Family Communication: updated patient's son, Zachary [name] on phone  Disposition: Status is: Inpatient  Remains inpatient appropriate because: TEE planned tomorrow, work-up in progress   Time spent: 37 minutes  Author: Estill Cotta, MD 04/25/2021 12:08 PM  For on call review [email or web address].

## 2021-04-25 NOTE — Progress Notes (Signed)
Ok to add Lovenox 40mg  SQ qday for DVT prophylaxis per Dr Clemencia Course, PharmD, BCIDP, AAHIVP, CPP Infectious Disease Pharmacist 04/25/2021 8:30 AM

## 2021-04-25 NOTE — Assessment & Plan Note (Addendum)
-   Severe constipation, received enema on 1/15, continue Senokot-S, MiraLAX 17 g twice daily, lactulose 20 g twice daily as needed for constipation -KUB with no SBO, now placed on Movantik 25 mg twice daily

## 2021-04-25 NOTE — H&P (View-Only) (Signed)
Progress Note   Patient: Zachary [name] MBE:[id number] DOB: 06-Dec-1945 DOA: 04/18/2021     6 DOS: the patient was seen and examined on 04/25/2021   Brief hospital course: Patient is a 76 year old male with a history of type 2 diabetes mellitus, HTN, HLD, PVD, BPH presented with bilateral leg pain.He had  aorto-peripheral angiogram on 04/14/21 by IR. After the procedure he reportedly started to have pain in his legs. No bleeding or drainage from procedure site. He also has had right lower back pain for the past few days as well.  Work-up revealed osteomyelitis of both proximal and distal phalanx of the great toe seen on MRI 04/18/2021.   Underwent right great toe amputation on 1/10, podiatry following  Hospital course complicated by MSSA bacteremia, E. coli UTI.  ID consulted Surgical cultures with E. 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  Neurosurgery consulted MRI left foot: Osteomyelitis of the left fourth distal phalanx, podiatry reconsulted Cardiology consulted for TEE, planned on Monday  Assessment and Plan * MSSA bacteremia - Hospital course complicated by MSSA bacteremia, POA -ID consulted.  Switched to cefazolin on 04/19/2021 -2D echo showed EF 40 to 45%, no vegetation. -Given new MRI findings of epidural abscess, osteomyelitis fourth toe left foot, will proceed with TEE.  -Consulted cardiology, TEE planned on Monday, 1/16, n.p.o. 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 Kathyrn Sheriff recommended continue IV antibiotics and repeat MRI of the lumbar spine w/wo contrast in 2-3 weeks.  Currently no plan for surgical intervention. -Continue pain control  Osteomyelitis of great toe of right foot ([place])- (present on admission) - Underwent amputation of the right great toe, application of skin graft substitute  on 1/10 - stable from podiatry standpoint, recommended to leave dressing intact until follow-up outpatient  - Blood cultures 1/9 NTD -Surgical cultures positive for E. 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 Cannon Kettle on 1/13, recommended conservative management and wound care for now  Peripheral vascular disease ([place])- (present on admission) -Recent angiogram to right leg on 04/14/21 by IR, no signs of infection at incision site -Has had progressive pain in both legs, Dr Rogers Blocker had discussed with IR, Dr. Annamaria Boots -- Does not appear to have any pending podiatry procedures, will resume aspirin and Plavix. -Refuses statin.  Outpatient follow-up with IR  E. coli UTI- (present on admission) UA positive for pyuria, urine culture on 1/8 showed more than 100,000 colonies of E. Coli. -Currently on IV cefepime  Hyponatremia- (present on admission) - Sodium improving, urine sodium 621, likely SIADH, hold off on IV fluids   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 ([name])- (present on admission) -  hemoglobin A1c 8.5 on 1/10  -On Semglee 24 units daily (has already received it this morning), sliding scale insulin moderate  -Follow CBGs, will be n.p.o. after midnight.  If any hypoglycemia, will place on D5 in am  Recent Labs    04/23/21 2137 04/24/21 0636 04/24/21 1122 04/24/21 1637 04/24/21 2053 04/25/21 0629  GLUCAP 184* 157* 163* 170* 190* 197*    Stage 3 chronic kidney disease ([name])- (present on admission) - CKD stage IIIb, baseline1.4-1.8. -Also had anion gap metabolic acidosis likely due CKD.  Patient was placed on IV gentle hydration.  Gap closed. -Obtain BMET in a.m., creatinine resolving and has been at baseline  Essential hypertension- (present on admission) - BP stable   Constipation- (present on admission) - Still feels constipated, no BM in  the last 3 to 4 days despite stool softeners, will give smog enema x1  Hyperlipidemia- (present on admission) -Declined statin  sepsis secondary to pneumonia vs. osteomyelitis of rigth great toe - (present on admission) - Met sepsis criteria at the time of admission with leukocytosis, tachycardia, tachypnea, source gangrenous right toe with osteomyelitis.  - -initially given vanc/cefepime and flagyl, changed to vanc/rocephin and Zithromax to cover for osteo - then transitioned to IV cefazolin on 1/9 for MSSA bacteremia, currently on IV cefepime to cover Klebsiella, E. coli in the surgical cultures - Underwent right great toe amputation on 1/10.     Subjective: Feels constipated, no BM in the last 3 to 4 days.  Back pain is controlled  Objective Vitals:   04/25/21 0502 04/25/21 0851  BP: (!) 165/76 119/79  Pulse: 89 88  Resp: 17 18  Temp: 98 F (36.7 C) 98.4 F (36.9 C)  SpO2: 95% 96%     Physical Exam General: Alert and oriented x 3, NAD Cardiovascular: S1 S2 clear, RRR. No pedal edema b/l Respiratory: CTAB, no wheezing, rales or rhonchi Gastrointestinal: Soft, nontender, nondistended, NBS Ext: right foot dressing intact Neuro: no new deficits   Data Reviewed:  There are no new results to review at this time.  Family Communication: updated patient's son, Zachary [name] on phone  Disposition: Status is: Inpatient  Remains inpatient appropriate because: TEE planned tomorrow, work-up in progress   Time spent: 37 minutes  Author: Estill Cotta, MD 04/25/2021 12:08 PM  For on call review [email or web address].

## 2021-04-26 ENCOUNTER — Encounter (HOSPITAL_COMMUNITY): Payer: Self-pay | Admitting: Internal Medicine

## 2021-04-26 ENCOUNTER — Inpatient Hospital Stay (HOSPITAL_COMMUNITY): Payer: Medicare PPO

## 2021-04-26 ENCOUNTER — Inpatient Hospital Stay (HOSPITAL_COMMUNITY): Payer: Medicare PPO | Admitting: Anesthesiology

## 2021-04-26 ENCOUNTER — Encounter (HOSPITAL_COMMUNITY)
Admission: EM | Disposition: A | Discharge status: Skilled Nursing Facility | Payer: Self-pay | Source: Home / Self Care | Attending: Internal Medicine

## 2021-04-26 DIAGNOSIS — I342 Nonrheumatic mitral (valve) stenosis: Secondary | ICD-10-CM

## 2021-04-26 DIAGNOSIS — I350 Nonrheumatic aortic (valve) stenosis: Secondary | ICD-10-CM

## 2021-04-26 DIAGNOSIS — B9561 Methicillin susceptible Staphylococcus aureus infection as the cause of diseases classified elsewhere: Secondary | ICD-10-CM | POA: Diagnosis not present

## 2021-04-26 DIAGNOSIS — G253 Myoclonus: Secondary | ICD-10-CM

## 2021-04-26 DIAGNOSIS — R7881 Bacteremia: Secondary | ICD-10-CM | POA: Diagnosis not present

## 2021-04-26 HISTORY — PX: TEE WITHOUT CARDIOVERSION: SHX5443

## 2021-04-26 HISTORY — PX: BUBBLE STUDY: SHX6837

## 2021-04-26 LAB — CBC
HCT: 40.2 % (ref 39.0–52.0)
Hemoglobin: 13.4 g/dL (ref 13.0–17.0)
MCH: 29.2 pg (ref 26.0–34.0)
MCHC: 33.3 g/dL (ref 30.0–36.0)
MCV: 87.6 fL (ref 80.0–100.0)
Platelets: 334 10*3/uL (ref 150–400)
RBC: 4.59 MIL/uL (ref 4.22–5.81)
RDW: 12.4 % (ref 11.5–15.5)
WBC: 9.1 10*3/uL (ref 4.0–10.5)
nRBC: 0 % (ref 0.0–0.2)

## 2021-04-26 LAB — BASIC METABOLIC PANEL
Anion gap: 7 (ref 5–15)
BUN: 16 mg/dL (ref 8–23)
CO2: 30 mmol/L (ref 22–32)
Calcium: 8.3 mg/dL — ABNORMAL LOW (ref 8.9–10.3)
Chloride: 99 mmol/L (ref 98–111)
Creatinine, Ser: 1.38 mg/dL — ABNORMAL HIGH (ref 0.61–1.24)
GFR, Estimated: 53 mL/min — ABNORMAL LOW (ref >60)
Glucose, Bld: 153 mg/dL — ABNORMAL HIGH (ref 70–99)
Potassium: 4.2 mmol/L (ref 3.5–5.1)
Sodium: 136 mmol/L (ref 135–145)

## 2021-04-26 LAB — ECHO TEE
AR max vel: 1.1 cm2
AV Area VTI: 1.24 cm2
AV Area mean vel: 1.13 cm2
AV Mean grad: 14 mmHg
AV Peak grad: 24.4 mmHg
Ao pk vel: 2.47 m/s
MV VTI: 1.28 cm2
P 1/2 time: 600 msec

## 2021-04-26 LAB — AMMONIA: Ammonia: <10 umol/L (ref 9–35)

## 2021-04-26 LAB — GLUCOSE, CAPILLARY
Glucose-Capillary: 161 mg/dL — ABNORMAL HIGH (ref 70–99)
Glucose-Capillary: 171 mg/dL — ABNORMAL HIGH (ref 70–99)
Glucose-Capillary: 129 mg/dL — ABNORMAL HIGH (ref 70–99)
Glucose-Capillary: 177 mg/dL — ABNORMAL HIGH (ref 70–99)
Glucose-Capillary: 108 mg/dL — ABNORMAL HIGH (ref 70–99)

## 2021-04-26 SURGERY — ECHOCARDIOGRAM, TRANSESOPHAGEAL
Anesthesia: Monitor Anesthesia Care

## 2021-04-26 MED ORDER — POLYETHYLENE GLYCOL 3350 17 G PO PACK
17.0000 g | PACK | Freq: Two times a day (BID) | ORAL | Status: DC
  Administered 2021-04-26 – 2021-04-29 (×7): 17 g via ORAL
  Filled 2021-04-26 (×6): qty 1

## 2021-04-26 MED ORDER — CIPROFLOXACIN HCL 500 MG PO TABS
500.0000 mg | ORAL_TABLET | Freq: Two times a day (BID) | ORAL | Status: DC
  Administered 2021-04-26 – 2021-04-29 (×7): 500 mg via ORAL
  Filled 2021-04-26 (×7): qty 1

## 2021-04-26 MED ORDER — NALOXEGOL OXALATE 25 MG PO TABS
25.0000 mg | ORAL_TABLET | Freq: Every day | ORAL | Status: AC
  Administered 2021-04-26 – 2021-04-27 (×2): 25 mg via ORAL
  Filled 2021-04-26 (×2): qty 1

## 2021-04-26 MED ORDER — CEFAZOLIN SODIUM-DEXTROSE 2-4 GM/100ML-% IV SOLN
2.0000 g | Freq: Three times a day (TID) | INTRAVENOUS | Status: DC
  Administered 2021-04-26 – 2021-04-29 (×8): 2 g via INTRAVENOUS
  Filled 2021-04-26 (×11): qty 100

## 2021-04-26 MED ORDER — PHENYLEPHRINE 40 MCG/ML (10ML) SYRINGE FOR IV PUSH (FOR BLOOD PRESSURE SUPPORT)
PREFILLED_SYRINGE | INTRAVENOUS | Status: DC | PRN
  Administered 2021-04-26 (×3): 80 ug via INTRAVENOUS
  Administered 2021-04-26: 120 ug via INTRAVENOUS

## 2021-04-26 MED ORDER — PROPOFOL 10 MG/ML IV BOLUS
INTRAVENOUS | Status: DC | PRN
  Administered 2021-04-26: 20 mg via INTRAVENOUS

## 2021-04-26 MED ORDER — PROPOFOL 500 MG/50ML IV EMUL
INTRAVENOUS | Status: DC | PRN
  Administered 2021-04-26: 100 ug/kg/min via INTRAVENOUS

## 2021-04-26 MED ORDER — LACTULOSE 10 GM/15ML PO SOLN: 20.0000 g | Freq: Two times a day (BID) | ORAL | Status: DC | PRN

## 2021-04-26 MED ORDER — INSULIN ASPART 100 UNIT/ML IJ SOLN
3.0000 [IU] | Freq: Three times a day (TID) | INTRAMUSCULAR | Status: DC
  Administered 2021-04-27 – 2021-04-29 (×7): 3 [IU] via SUBCUTANEOUS

## 2021-04-26 NOTE — Progress Notes (Signed)
[facility] for Infectious Disease  Date of Admission:  04/18/2021   Total days of inpatient antibiotics 10  Principal Problem:   MSSA bacteremia Active Problems:   Essential hypertension   Hyperlipidemia   Stage 3 chronic kidney disease (HCC)   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 (HCC)   Peripheral vascular disease (HCC)   Osteomyelitis of great toe of right foot (HCC)   sepsis secondary to pneumonia vs. osteomyelitis of rigth great toe    Hyponatremia   E. 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  ID engaged as he was found to have MSSA bacteremia.  He had undergone right lower extremity angiogram on 04/14/2021 femoral access.  On arrival to the ED he had a temp of 103.4, WBC 20 5K.  MRI right foot showed osteomyelitis of the distal phalanx of the great toe.  #MSSA bacteremia #Right great toe osteomyelitis SP amputation of right great toe on 1/10 with Cx+ Ecoli and klebsiella oxytoca -Repeat blood cx negative on 1/9 -Cefazolin changed to cefepime per Ecoli and Klebsiella oxytoca+(in addition to staph aureus) OR Cx -Pt developed hand jerking. Cefepime was discontinued. Neurology engaged. Antibiotics changed to cefazolin and ciprofloxacin.   #T12-L4/L5 epidural abscess -Pt. did not complain of back pain and non-tender on palpation. Although,given extent of abscess on imaging antibiotics alone unlikely to be curative.  -Neurosurgery engaged and no plans for intervention -TEE showed no valvular vegetation   #Left 4th distal phalanx -Podiatry recommended vascular evaluation with Dr. Rockwell Germany) as he needs revascularization prior to surgery. Local wound care for now.  -Antibiotics as above  Recommendations: -Continue cefazolin  and Ciprofloxacin to complete 8 weeks of antibiotics from OR (06/14/21) _ ID will sign off  OPAT ORDERS:  Diagnosis: MSSA bacteremia with epidural abscess  Culture Result: MSSA + blood Cx  Allergies  Allergen Reactions   Metformin Hcl Other (See Comments)    Unknown reaction   Pravastatin Sodium Other (See Comments)    Muscle pain      Discharge antibiotics to be given via PICC line:  Per pharmacy protocol cefazolin    Duration: 8 weeks End Date: 06/14/21  [facility] Care Per Protocol with [employer] Use: Home health RN for IV administration and teaching, line care and labs.    Labs weekly while on IV antibiotics: __ CBC with differential __ CMP __ CRP __ ESR   __ Please pull PIC at completion of IV antibiotics   Fax weekly labs to [phone]  Clinic Follow Up Appt: 05/18/21 at [place]  @ [facility] with Dr. Candiss Norse    Microbiology:   Antibiotics: Azithromycin Cefazolin 1/9-1/12 Cefepime 1/8,1/12-p Vancomycin 1/8-1/10 Cultures: Blood 1/8 MSSA 1/9 NGTD Urine 1/8 Ecoli Other OR Cx 1/10  SUBJECTIVE: Reports back is bothering him today. Bit more pain. Interval: Afebrile overnight.  Review of Systems: Review of Systems  All other systems reviewed and are negative.   Scheduled Meds:  aspirin EC  81 mg Oral Daily   Chlorhexidine Gluconate Cloth  6 each Topical Daily   ciprofloxacin  500 mg Oral BID   clopidogrel  75 mg Oral Daily   diltiazem  120 mg Oral Daily   enoxaparin (LOVENOX) injection  40 mg Subcutaneous Q24H  insulin aspart  0-15 Units Subcutaneous TID WC   insulin aspart  0-5 Units Subcutaneous QHS   insulin glargine-yfgn  24 Units Subcutaneous Daily   polyethylene glycol  17 g Oral Daily   senna-docusate  2 tablet Oral BID   sodium chloride flush  10-40 mL Intracatheter Q12H   Continuous Infusions:  sodium chloride 10 mL/hr at 04/24/21 2108   sodium chloride      ceFAZolin (ANCEF) IV     PRN Meds:.sodium chloride, acetaminophen **OR**  acetaminophen, guaiFENesin-dextromethorphan, hydrALAZINE, HYDROcodone-acetaminophen, ipratropium-albuterol, sodium chloride flush Allergies  Allergen Reactions   Metformin Hcl Other (See Comments)    Unknown reaction   Pravastatin Sodium Other (See Comments)    Muscle pain     OBJECTIVE: Vitals:   04/25/21 1814 04/25/21 2116 04/26/21 0453 04/26/21 0942  BP: (!) 112/92 (!) 144/85 (!) 145/69 (!) 145/83  Pulse: 78 82 85 93  Resp: $Remo'16 17 18 18  'ztTOW$ Temp: 98 F (36.7 C) 97.7 F (36.5 C) 98.1 F (36.7 C) 98.3 F (36.8 C)  TempSrc: Oral Oral Oral Oral  SpO2: 97% 95% 96% 97%  Weight:      Height:       Body mass index is 28.22 kg/m.  Physical Exam Constitutional:      General: He is not in acute distress.    Appearance: He is normal weight. He is not toxic-appearing.  HENT:     Head: Normocephalic and atraumatic.     Right Ear: External ear normal.     Left Ear: External ear normal.     Nose: No congestion or rhinorrhea.     Mouth/Throat:     Mouth: Mucous membranes are moist.     Pharynx: Oropharynx is clear.  Eyes:     Extraocular Movements: Extraocular movements intact.     Conjunctiva/sclera: Conjunctivae normal.     Pupils: Pupils are equal, round, and reactive to light.  Cardiovascular:     Rate and Rhythm: Normal rate and regular rhythm.     Heart sounds: No murmur heard.   No friction rub. No gallop.  Pulmonary:     Effort: Pulmonary effort is normal.     Breath sounds: Normal breath sounds.  Abdominal:     General: Abdomen is flat. Bowel sounds are normal.     Palpations: Abdomen is soft.  Musculoskeletal:        General: No swelling. Normal range of motion.     Cervical back: Normal range of motion and neck supple.  Skin:    General: Skin is warm and dry.  Neurological:     General: No focal deficit present.     Mental Status: He is oriented to person, place, and time.  Psychiatric:        Mood and Affect: Mood normal.      Lab Results Lab Results   Component Value Date   WBC 9.1 04/26/2021   HGB 13.4 04/26/2021   HCT 40.2 04/26/2021   MCV 87.6 04/26/2021   PLT 334 04/26/2021    Lab Results  Component Value Date   CREATININE 1.38 (H) 04/26/2021   BUN 16 04/26/2021   NA 136 04/26/2021   K 4.2 04/26/2021   CL 99 04/26/2021   CO2 30 04/26/2021    Lab Results  Component Value Date   ALT 9 04/22/2021   AST 19 04/22/2021   ALKPHOS 67 04/22/2021   BILITOT 0.3 04/22/2021        Laurice Record, [place]  for Infectious Disease [facility] 04/26/2021, 10:25 AM

## 2021-04-26 NOTE — Assessment & Plan Note (Addendum)
-   Patient noted to have right hand myoclonic jerking on 1/16  -Neurology was consulted.  Ammonia level normal.  Recent TSH normal. -Neurology recommended to discontinue cefepime, will likely take 48 to 72 hours to improve off cefepime -Right hand jerking movement appears to be improving today

## 2021-04-26 NOTE — Interval H&P Note (Signed)
History and Physical Interval Note:  04/26/2021 12:18 PM  Lorelee Market  has presented today for surgery, with the diagnosis of BACTEREMIA.  The various methods of treatment have been discussed with the patient and family. After consideration of risks, benefits and other options for treatment, the patient has consented to  Procedure(s): TRANSESOPHAGEAL ECHOCARDIOGRAM (TEE) (N/A) as a surgical intervention.  The patient's history has been reviewed, patient examined, no change in status, stable for surgery.  I have reviewed the patient's chart and labs.  Questions were answered to the patient's satisfaction.    NPO for TEE for bacteremia. No difficulties swallowing.   [place] T. Audie Box, MD, [place]  [address], [place], [place] [address] [phone]  12:19 PM

## 2021-04-26 NOTE — Anesthesia Procedure Notes (Signed)
Procedure Name: MAC Date/Time: 04/26/2021 1:13 PM Performed by: Jenne Campus, CRNA Pre-anesthesia Checklist: Patient identified, Emergency Drugs available, Suction available, Patient being monitored and Timeout performed Oxygen Delivery Method: Simple face mask

## 2021-04-26 NOTE — Progress Notes (Addendum)
Physical Therapy Treatment Patient Details Name: Zachary [name] MRN: [id number] DOB: 1946/04/04 Today's Date: 04/26/2021   History of Present Illness Pt is a 76 yo male admitted for osteomyelitis of the R great toe. Pt underwent amputation of great toe on 1/10 w skin graft. Pt with MSSA bacteremia. PMH:  DM2, HLD, HTN, PVD, and L great toe amputation.    PT Comments    Pt agreeable and motivated to participate in therapy session. Focus on bed level exercises for strengthening, transfer and pre gait training. Pt continues to require min-mod assist for transfers and is limited in ambulation attempts by urinary urgency and frequency. Pt demonstrates generalized weakness, impaired balance, and decreased activity tolerance. Continue to recommend SNF for ongoing Physical Therapy.     Recommendations for follow up therapy are one component of a multi-disciplinary discharge planning process, led by the attending physician.  Recommendations may be updated based on patient status, additional functional criteria and insurance authorization.  Follow Up Recommendations  Skilled nursing-short term rehab (<3 hours/day)     Assistance Recommended at Discharge Intermittent Supervision/Assistance (assistance for all transfers and ambulation)  Patient can return home with the following A lot of help with walking and/or transfers;A little help with bathing/dressing/bathroom;Assistance with cooking/housework;Help with stairs or ramp for entrance   Equipment Recommendations  Wheelchair (measurements PT);Wheelchair cushion (measurements PT) (pt reports owning a BSC and a RW)    Recommendations for Other Services       Precautions / Restrictions Precautions Precautions: Fall Required Braces or Orthoses: Other Brace Other Brace: R CAM boot Restrictions Weight Bearing Restrictions: Yes RLE Weight Bearing: Weight bearing as tolerated Other Position/Activity Restrictions: WBAT with CAM boot     Mobility   Bed Mobility Overal bed mobility: Needs Assistance Bed Mobility: Supine to Sit     Supine to sit: HOB elevated;Min assist     General bed mobility comments: step by step cues for sequencing, use of bed rail, minA for trunk to upright positioning    Transfers Overall transfer level: Needs assistance Equipment used: Rolling walker (2 wheels) Transfers: Sit to/from Stand Sit to Stand: Mod assist     Step pivot transfers: Min assist     General transfer comment: Pt requiring modA to rise from edge of bed and chair x 4. Able to take steps forward and back. Cues for hand/foot placement, scooting to edge, assist to rise and balance    Ambulation/Gait               General Gait Details: limited by urinary urgency and frequency   Stairs             Wheelchair Mobility    Modified Rankin (Stroke Patients Only)       Balance Overall balance assessment: Needs assistance Sitting-balance support: No upper extremity supported;Feet supported Sitting balance-Leahy Scale: Good     Standing balance support: Bilateral upper extremity supported;Reliant on assistive device for balance Standing balance-Leahy Scale: Poor                              Cognition Arousal/Alertness: Awake/alert Behavior During Therapy: WFL for tasks assessed/performed Overall Cognitive Status: Within Functional Limits for tasks assessed                                          Exercises General  Exercises - Lower Extremity Heel Slides: Both;10 reps;Supine Straight Leg Raises: Both;10 reps;Supine    General Comments  HR 97-105 bpm      Pertinent Vitals/Pain Pain Assessment: Faces Faces Pain Scale: Hurts even more Pain Location: back Pain Descriptors / Indicators: Sore;Discomfort;Guarding;Grimacing Pain Intervention(s): Limited activity within patient's tolerance;Monitored during session    Home Living                          Prior Function             PT Goals (current goals can now be found in the care plan section) Acute Rehab PT Goals Patient Stated Goal: go home Potential to Achieve Goals: Good Progress towards PT goals: Progressing toward goals    Frequency    Min 2X/week      PT Plan Frequency needs to be updated    Co-evaluation              AM-PAC PT "6 Clicks" Mobility   Outcome Measure  Help needed turning from your back to your side while in a flat bed without using bedrails?: A Little Help needed moving from lying on your back to sitting on the side of a flat bed without using bedrails?: A Little Help needed moving to and from a bed to a chair (including a wheelchair)?: A Little Help needed standing up from a chair using your arms (e.g., wheelchair or bedside chair)?: A Lot Help needed to walk in hospital room?: Total Help needed climbing 3-5 steps with a railing? : Total 6 Click Score: 13    End of Session   Activity Tolerance: Patient tolerated treatment well Patient left: in chair;with call bell/phone within reach Nurse Communication: Mobility status PT Visit Diagnosis: Unsteadiness on feet (R26.81);Muscle weakness (generalized) (M62.81);Difficulty in walking, not elsewhere classified (R26.2);Pain Pain - Right/Left: Right Pain - part of body: Ankle and joints of foot     Time: 2355-7322 PT Time Calculation (min) (ACUTE ONLY): 26 min  Charges:  $Therapeutic Activity: 23-37 mins                     Wyona Almas, PT, DPT Acute Rehabilitation Services Pager [phone] Office [phone]    Deno Etienne 04/26/2021, 9:39 AM

## 2021-04-26 NOTE — TOC Progression Note (Addendum)
Transition of Care [facility]) - Initial/Assessment Note    Patient Details  Name: Zachary [name] MRN: [id number] Date of Birth: 1945/08/20  Transition of Care [facility]) CM/SW Contact:    Milinda Antis, [place] Phone Number: 04/26/2021, 2:56 PM  Clinical Narrative:                 CSW requested that [facility] CMA's begin insurance auth. as patient is expected to be medically ready tomorrow or Wednesday.  16:37-  Insurance authorization has been approved from 1/17 - 1/19.  Reference ID:  [id number].  Plan Josem Kaufmann: [id number]  Expected Discharge Plan: Skilled Nursing Facility Barriers to Discharge: Insurance Authorization, SNF Pending bed offer   Patient Goals and CMS Choice   CMS Medicare.gov Compare Post Acute Care list provided to:: Patient Choice offered to / list presented to : Patient  Expected Discharge Plan and Services Expected Discharge Plan: [place]       Living arrangements for the past 2 months: Single Family Home                                      Prior Living Arrangements/Services Living arrangements for the past 2 months: Single Family Home Lives with:: Self, Spouse Patient language and need for interpreter reviewed:: Yes Do you feel safe going back to the place where you live?: Yes      Need for Family Participation in Patient Care: Yes (Comment) Care giver support system in place?: Yes (comment)   Criminal Activity/Legal Involvement Pertinent to Current Situation/Hospitalization: No - Comment as needed  Activities of Daily Living Home Assistive Devices/Equipment: None ADL Screening (condition at time of admission) Patient's cognitive ability adequate to safely complete daily activities?: Yes Is the patient deaf or have difficulty hearing?: No Does the patient have difficulty seeing, even when wearing glasses/contacts?: No Does the patient have difficulty concentrating, remembering, or making decisions?: No Patient able to express need for  assistance with ADLs?: Yes Does the patient have difficulty dressing or bathing?: No Independently performs ADLs?: Yes (appropriate for developmental age) Does the patient have difficulty walking or climbing stairs?: Yes Weakness of Legs: Both Weakness of Arms/Hands: None  Permission Sought/Granted   Permission granted to share information with : Yes, Verbal Permission Granted     Permission granted to share info w AGENCY: SNF        Emotional Assessment Appearance:: Appears older than stated age Attitude/Demeanor/Rapport: Engaged Affect (typically observed): Pleasant, Adaptable Orientation: : Oriented to Self, Oriented to Place, Oriented to  Time, Oriented to Situation Alcohol / Substance Use: Not Applicable Psych Involvement: No (comment)  Admission diagnosis:  Bacteremia [R78.81] Dyspnea [R06.00] Osteomyelitis ([place]) [M86.9] Sepsis, due to unspecified organism, unspecified whether acute organ dysfunction present [facility]) [A41.9] Patient Active Problem List   Diagnosis Date Noted   Myoclonic jerking- right hand 04/26/2021   Constipation 04/25/2021   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 coli UTI 04/21/2021   MSSA bacteremia 04/19/2021   Osteomyelitis of great toe of right foot ([place]) 04/18/2021   sepsis secondary to pneumonia vs. 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   Headache 03/24/2020   Hyperlipidemia, unspecified 03/24/2020   Hyperlipidemia 03/24/2020   Long term (current) use of insulin ([place]) 03/24/2020   Non-pressure chronic ulcer of right  heel and midfoot with other specified severity ([place]) 03/24/2020   Stage 3 chronic kidney disease ([place]) 03/24/2020   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 ([place]) 17/00/1749   Gangrenous toe ([place])    Acute osteomyelitis ([place]) 09/13/2019   Quadriceps tendon rupture, left, initial encounter 01/10/2019   Closed comminuted fracture of left patella, initial encounter 01/08/2019   PCP:  [name], FNP Pharmacy:   [id number] - [place], [place] [address] [place] [address] Phone: [phone] Fax: [place] [phone] - [place], [place] [address] Phone: [phone] Fax: [phone]     Social Determinants of Health (SDOH) Interventions    Readmission Risk Interventions No flowsheet data found.

## 2021-04-26 NOTE — Progress Notes (Signed)
°  Echocardiogram Echocardiogram Transesophageal has been performed.  Zachary [name] 04/26/2021, 1:58 PM

## 2021-04-26 NOTE — CV Procedure (Signed)
° ° °  TRANSESOPHAGEAL ECHOCARDIOGRAM   NAME:  Zachary [name]    MRN: [id number] DOB:  1946-03-28    ADMIT DATE: 04/18/2021  INDICATIONS: Bacteremia  PROCEDURE:   Informed consent was obtained prior to the procedure. The risks, benefits and alternatives for the procedure were discussed and the patient comprehended these risks.  Risks include, but are not limited to, cough, sore throat, vomiting, nausea, somnolence, esophageal and stomach trauma or perforation, bleeding, low blood pressure, aspiration, pneumonia, infection, trauma to the teeth and death.    Procedural time out performed. The oropharynx was anesthetized with topical 1% benzocaine.    Anesthesia was administered by Dr. Doroteo Glassman.  The patient was administered 165 mg of propofol and 0 mg of lidocaine to achieve and maintain moderate conscious sedation.  The patient's heart rate, blood pressure, and oxygen saturation are monitored continuously during the procedure. The period of conscious sedation is 16 minutes, of which I was present face-to-face 100% of this time.   The transesophageal probe was inserted in the esophagus and stomach without difficulty and multiple views were obtained.   COMPLICATIONS:    There were no immediate complications.  KEY FINDINGS:  No endocarditis.  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  Mild to moderate AS.  Normal LV/RV function.  Full report to follow. Further management per primary team.   [place]ake Bells T. Audie Box, MD, [facility]  [address] [place], [place] [address] [phone]  1:33 PM

## 2021-04-26 NOTE — Transfer of Care (Signed)
Immediate Anesthesia Transfer of Care Note  Patient: Zachary [name]  Procedure(s) Performed: TRANSESOPHAGEAL ECHOCARDIOGRAM (TEE) BUBBLE STUDY  Patient Location: PACU  Anesthesia Type:MAC  Level of Consciousness: oriented and patient cooperative  Airway & Oxygen Therapy: Patient Spontanous Breathing and Patient connected to nasal cannula oxygen  Post-op Assessment: Report given to RN and Post -op Vital signs reviewed and stable  Post vital signs: Reviewed  Last Vitals:  Vitals Value Taken Time  BP 112/53 04/26/21 1352  Temp 36.7 C 04/26/21 1350  Pulse 79 04/26/21 1356  Resp 17 04/26/21 1356  SpO2 99 % 04/26/21 1356  Vitals shown include unvalidated device data.  Last Pain:  Vitals:   04/26/21 1350  TempSrc: Oral  PainSc:       Patients Stated Pain Goal: 0 (99/35/70 1779)  Complications: No notable events documented.

## 2021-04-26 NOTE — Anesthesia Preprocedure Evaluation (Signed)
Anesthesia Evaluation  Patient identified by MRN, date of birth, ID band Patient awake    Reviewed: Allergy & Precautions, NPO status , Patient's Chart, lab work & pertinent test results  Airway Mallampati: III  TM Distance: >3 FB Neck ROM: Full    Dental  (+) Edentulous Upper, Edentulous Lower   Pulmonary neg pulmonary ROS,    Pulmonary exam normal breath sounds clear to auscultation       Cardiovascular hypertension, Pt. on medications + Peripheral Vascular Disease and +CHF (LVEF 40-45%)  Normal cardiovascular exam+ Valvular Problems/Murmurs (mild-mod MS, mod-severe AS)  Rhythm:Regular Rate:Normal  Echo 04/20/21: 1. Left ventricular ejection fraction, by estimation, is 40 to 45%. The  left ventricle has mildly decreased function. The left ventricle  demonstrates regional wall motion abnormalities (see scoring  diagram/findings for description). Left ventricular  diastolic parameters are indeterminate.  2. Right ventricular systolic function is normal. The right ventricular  size is normal. Tricuspid regurgitation signal is inadequate for assessing  PA pressure.  3. Left atrial size was mildly dilated.  4. Right atrial size was mildly dilated.  5. The mitral valve is degenerative. No evidence of mitral valve  regurgitation. Mild to moderate mitral stenosis. The mean mitral valve  gradient is 6.0 mmHg with average heart rate of 88 bpm. Moderate to severe  mitral annular calcification.  6. The aortic valve is tricuspid. Aortic valve regurgitation is not  visualized. Moderate to severe aortic valve stenosis. Aortic valve mean  gradient measures 28.0 mmHg. Aortic valve Vmax measures 3.82 m/s.    Neuro/Psych  Headaches, negative psych ROS   GI/Hepatic negative GI ROS, Neg liver ROS,   Endo/Other  diabetes, Type 2, Insulin DependentFS 171 11:30am  Renal/GU Renal Insufficiency and CRFRenal diseaseCr 1.38  negative  genitourinary   Musculoskeletal negative musculoskeletal ROS (+)   Abdominal   Peds negative pediatric ROS (+)  Hematology negative hematology ROS (+)   Anesthesia Other Findings   Reproductive/Obstetrics negative OB ROS                             Anesthesia Physical Anesthesia Plan  ASA: 4  Anesthesia Plan: MAC   Post-op Pain Management:    Induction:   PONV Risk Score and Plan: 2 and Propofol infusion and TIVA  Airway Management Planned: Natural Airway and Simple Face Mask  Additional Equipment: None  Intra-op Plan:   Post-operative Plan:   Informed Consent: I have reviewed the patients History and Physical, chart, labs and discussed the procedure including the risks, benefits and alternatives for the proposed anesthesia with the patient or authorized representative who has indicated his/her understanding and acceptance.   Patient has DNR.  Discussed DNR with patient, Continue DNR and Suspend DNR.     Plan Discussed with: CRNA  Anesthesia Plan Comments: (D/w pt DNR- ok with IV meds, intubation and compressions. Does not want to be shocked)        Anesthesia Quick Evaluation

## 2021-04-26 NOTE — Consult Note (Signed)
Neurology Consultation  Reason for Consult: tremor, jerking right hand.  Referring Physician: Tyler Pita, MD.   CC: jerking noted to right hand.   History is obtained from: patient.   HPI: Zachary [name] is a 76 y.o. male with a PMHx of DM II, HTN, HLD, PVD, and BPH who presented 7 days ago with bilateral leg pain. He had angiogram on 04/14/21 with c/o pain in LEs afterwards. He was found to have osteomyelitis of proximal and distal phalanx of the great toe with amputation on 04/20/21. Hospitalization was complicated by MSSA bactermeia and E Coli UTI. 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 No surgical intervention was recommended.   ID is on case and patient has been on multiple antibiotics. We are consulted for tremor/jerking of right hand.   Patient states this just started yesterday. His right hand will jerk at random. No rhythmical component to it. Jerking can happen at rest or with activity. Not noted to LUE. No ETOH use. He has not been dropping objects with his right hand, but states when jerking happens, he is ataxic for a few seconds.    Per MAR, he has been on Cefepime since the 12th. He has no dysarthria, facial droop, shaking or jerking of mouth or other extremities. Has not been sleepy after a jerk motion. He has not been incontinent or bit his tongue. No history of seizures.   ROS: A robust ROS was performed and is negative except as noted in the HPI.   Past Medical History:  Diagnosis Date   Diabetes mellitus    Hypercholesteremia    Hypertension    Patella fracture    left   PVD (peripheral vascular disease) ([place])     History reviewed. No pertinent family history.  Social History:   reports that he has never smoked. He has never used smokeless tobacco. He reports that he does not drink alcohol and does not use drugs.  Medications  Current Facility-Administered Medications:    0.9 %  sodium chloride infusion, ,  Intravenous, PRN, Rai, Ripudeep K, MD, Last Rate: 10 mL/hr at 04/24/21 2108, New Bag at 04/24/21 2108   0.9 %  sodium chloride infusion, , Intravenous, Continuous, Dunn, Dayna N, PA-C   acetaminophen (TYLENOL) tablet 650 mg, 650 mg, Oral, Q6H PRN, 650 mg at 04/22/21 1507 **OR** acetaminophen (TYLENOL) suppository 650 mg, 650 mg, Rectal, Q6H PRN, [name], [name]   aspirin EC tablet 81 mg, 81 mg, Oral, Daily, Rai, Ripudeep K, MD, 81 mg at 04/25/21 1249   ceFAZolin (ANCEF) IVPB 2g/100 mL premix, 2 g, Intravenous, Q8H, Singh, Mayanka, MD   Chlorhexidine Gluconate Cloth 2 % PADS 6 each, 6 each, Topical, Daily, Rai, Ripudeep K, MD, 6 each at 04/25/21 1000   ciprofloxacin (CIPRO) tablet 500 mg, 500 mg, Oral, BID, Laurice Record, MD   clopidogrel (PLAVIX) tablet 75 mg, 75 mg, Oral, Daily, Rai, Ripudeep K, MD, 75 mg at 04/25/21 1249   diltiazem (CARDIZEM CD) 24 hr capsule 120 mg, 120 mg, Oral, Daily, [name], [name], 120 mg at 04/25/21 0854   enoxaparin (LOVENOX) injection 40 mg, 40 mg, Subcutaneous, Q24H, Pham, Minh Q, RPH-CPP, 40 mg at 04/25/21 1249   guaiFENesin-dextromethorphan (ROBITUSSIN DM) 100-10 MG/5ML syrup 5 mL, 5 mL, Oral, Q4H PRN, [name], [name], 5 mL at 04/20/21 1523   hydrALAZINE (APRESOLINE) injection 5 mg, 5 mg, Intravenous, Q8H PRN, [name], [name]  HYDROcodone-acetaminophen (NORCO/VICODIN) 5-325 MG per tablet 1-2 tablet, 1-2 tablet, Oral, Q4H PRN, [name], [name], 2 tablet at 04/25/21 1509   insulin aspart (novoLOG) injection 0-15 Units, 0-15 Units, Subcutaneous, TID WC, Rai, Ripudeep K, MD, 3 Units at 04/26/21 0855   insulin aspart (novoLOG) injection 0-5 Units, 0-5 Units, Subcutaneous, QHS, Rai, Ripudeep K, MD   insulin glargine-yfgn (SEMGLEE) injection 24 Units, 24 Units, Subcutaneous, Daily, Rai, Ripudeep K, MD, 24 Units at 04/25/21 0855   ipratropium-albuterol (DUONEB) 0.5-2.5 (3) MG/3ML nebulizer solution 3 mL, 3 mL, Nebulization, Q6H PRN, [name], [name], 3 mL at 04/19/21 0153   polyethylene glycol (MIRALAX / GLYCOLAX) packet 17 g, 17 g, Oral, Daily, Rai, Ripudeep K, MD, 17 g at 04/25/21 0853   senna-docusate (Senokot-S) tablet 2 tablet, 2 tablet, Oral, BID, Rai, Ripudeep K, MD, 2 tablet at 04/25/21 2104   sodium chloride flush (NS) 0.9 % injection 10-40 mL, 10-40 mL, Intracatheter, Q12H, Rai, Ripudeep K, MD, 10 mL at 04/25/21 0854   sodium chloride flush (NS) 0.9 % injection 10-40 mL, 10-40 mL, Intracatheter, PRN, Rai, Ripudeep K, MD   Exam: Current vital signs: BP (!) 145/83    Pulse 93    Temp 98.3 F (36.8 C) (Oral)    Resp 18    Ht 5\' 8"  (1.727 m)    Wt 84.2 kg    SpO2 97%    BMI 28.22 kg/m  Vital signs in last 24 hours: Temp:  [97.7 F (36.5 C)-98.3 F (36.8 C)] 98.3 F (36.8 C) (01/16 0942) Pulse Rate:  [78-93] 93 (01/16 0942) Resp:  [16-18] 18 (01/16 0942) BP: (112-145)/(69-92) 145/83 (01/16 0942) SpO2:  [95 %-97 %] 97 % (01/16 0942)  PE: GENERAL: Chronically ill appearing male sitting in recliner. Awake, alert in NAD.  HEENT: normocephalic and atraumatic. LUNGS - Normal respiratory effort.  CV - RRR on tele. ABDOMEN - Soft, nontender. Ext: warm, well perfused LLE. He has dressing and boot on RLE. Marland Kitchen Psych: affect light.   NEURO:  Mental Status: Alert and oriented x3.  Speech/Language: speech is without dysarthria or aphasia.  Naming, repetition, fluency, and comprehension intact.  Cranial Nerves:  II: PERRL3  mm/brisk. visual fields full.  III, IV, VI: EOMI. Lid elevation symmetric and full.  V: sensation is intact and symmetrical to face.  VII: Smile is symmetrical.   VIII:hearing intact to voice. IX, X: palate elevation is symmetric. Phonation normal.  XI: normal sternocleidomastoid and trapezius muscle strength. QZE:SPQZRA is symmetrical without fasciculations.   Motor:  RUE/LUE: 5/5 strength.  LLE: 5/5.  Unable to test RLE due to boot.  Tone is normal. Bulk is normal.  Sensation- Intact to  light touch bilaterally in all four extremities. Extinction absent to light touch to DSS.  Coordination: FTN intact. Did not test HKS. Asterixis to Right hand. No tremor or jerking with intention. When at rest, there is random jerking to right hand, not rhythmical, low amplitude Gait- deferred for safety.  Labs I have reviewed labs in epic and the results pertinent to this consultation are: Creat 1.38 improving. Liver enzymes normal. TSH 1.263.   CBC    Component Value Date/Time   WBC 9.1 04/26/2021 0422   RBC 4.59 04/26/2021 0422   HGB 13.4 04/26/2021 0422   HCT 40.2 04/26/2021 0422   PLT 334 04/26/2021 0422   MCV 87.6 04/26/2021 0422   MCH 29.2 04/26/2021 0422   MCHC 33.3 04/26/2021 0422   RDW 12.4 04/26/2021 0422  LYMPHSABS 0.4 (L) 04/18/2021 1635   MONOABS 1.1 (H) 04/18/2021 1635   EOSABS 0.0 04/18/2021 1635   BASOSABS 0.1 04/18/2021 1635    CMP     Component Value Date/Time   NA 136 04/26/2021 0422   K 4.2 04/26/2021 0422   CL 99 04/26/2021 0422   CO2 30 04/26/2021 0422   GLUCOSE 153 (H) 04/26/2021 0422   BUN 16 04/26/2021 0422   CREATININE 1.38 (H) 04/26/2021 0422   CALCIUM 8.3 (L) 04/26/2021 0422   PROT 5.3 (L) 04/22/2021 1236   ALBUMIN 1.9 (L) 04/22/2021 1236   AST 19 04/22/2021 1236   ALT 9 04/22/2021 1236   ALKPHOS 67 04/22/2021 1236   BILITOT 0.3 04/22/2021 1236   GFRNONAA 53 (L) 04/26/2021 0422   GFRAA 57 (L) 62/56/3893 7342    No applicable imaging to consult.   Assessment: 76 yo male with a complicated hospital stay with noted jerking of right hand yesterday. He has asterixis on right on exam. No intentional tremor. Not rhytmical and low amplitude. He has been on Cefepime x 3 days which can cause jerking or tremors. He has no other signs or symptoms of seizures, so likely this is medication related. We will hold off on EEG, as it will likely be low yield at this time.   Impression: -myoclonic jerks along with mild asterixis/negative myoclonuc likely  a side effect of Cefepime.  Recommendations/Plan:  - Recommend alternative to Cefepime. - would expect it to improve after 48- 72 hours after discontinuation of Cefepime. - We will sign off. Please call us back if the jerks are persistent despite disconinuation of Cefepime.  Plan discussed with Dr. Tana Coast by Dr. Meredeth Ide.   Pt seen by Clance Boll, NP/Neuro and later by MD. Note/plan to be edited by MD as needed.  Pager: [phone]  NEUROHOSPITALIST ADDENDUM Performed a face to face diagnostic evaluation.   I have reviewed the contents of history and physical exam as documented by PA/ARNP/Resident and agree with above documentation.  I have discussed and formulated the above plan as documented. Edits to the note have been made as needed.  Impression/Key exam findings/Plan: Has mild intention tremors and asterixis/negative myoclonus on exam. Started yesterday per patient. No EtOH use, recent TSH was normal. Likely this is Cefepime induced. Recommend alternative to Cefepime and would expect it to improve after stopping Cefepime for 48 to 72 hours. We will signoff. Please call us back if the tremors/asterixis is persistent despite holding Cefepime.  Plan discussed with Dr. Tana Coast over phone.  Donnetta Simpers, MD [facility] [phone]   If 7pm to 7am, please call on call as listed on AMION.

## 2021-04-26 NOTE — Progress Notes (Addendum)
Progress Note   Patient: Zachary [name] UVO:[id number] DOB: [birth year] DOA: 04/18/2021     7 DOS: the patient was seen and examined on 04/26/2021   Brief hospital course: Patient is a 76 year old male with a history of type 2 diabetes mellitus, HTN, HLD, PVD, BPH presented with bilateral leg pain.He had  aorto-peripheral angiogram on 04/14/21 by IR. After the procedure he reportedly started to have pain in his legs. No bleeding or drainage from procedure site. He also has had right lower back pain for the past few days as well.  Work-up revealed osteomyelitis of both proximal and distal phalanx of the great toe seen on MRI 04/18/2021.   Underwent right great toe amputation on 1/10, podiatry following  Hospital course complicated by MSSA bacteremia, E. coli UTI.  ID consulted Surgical cultures with E. 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  Neurosurgery consulted MRI left foot: Osteomyelitis of the left fourth distal phalanx, podiatry reconsulted 1/16: TEE completed: No endocarditis  Assessment and Plan * MSSA bacteremia- (present on admission) - Hospital course complicated by MSSA bacteremia, POA -ID consulted.  Switched to cefazolin on 04/19/2021 -2D echo showed EF 40 to 45%, no vegetation. -TEE today showed no endocarditis, moderate to severe calcific MS, mild to moderate AAS, normal LV/RV function -IV cefepime held due to jerking movements right hand this a.m. 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 Kathyrn Sheriff recommended continue IV antibiotics and repeat MRI of the lumbar spine w/wo contrast in 2-3 weeks.  Currently no plan for surgical intervention. -Continue pain control  Osteomyelitis of great toe of right foot ([place])- (present on admission) - Underwent  amputation of the right great toe, application of skin graft substitute on 1/10 - stable from podiatry standpoint, recommended to leave dressing intact until follow-up outpatient  - Blood cultures 1/9 NTD -Surgical cultures positive for E. 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 Cannon Kettle on 1/13, recommended conservative management and wound care for now  Peripheral vascular disease ([place])- (present on admission) -Recent angiogram to right leg on 04/14/21 by IR, no signs of infection at incision site -Has had progressive pain in both legs, Dr Rogers Blocker had discussed with IR, Dr. Annamaria Boots -- Does not appear to have any pending podiatry procedures, will resume aspirin and Plavix. -Refuses statin.  Outpatient follow-up with IR  E. coli UTI- (present on admission) UA positive for pyuria, urine culture on 1/8 showed more than 100,000 colonies of E. Coli.   Hyponatremia- (present on admission) - Sodium improving, urine sodium 621, likely SIADH, hold off on IV fluids   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 ([place])- (present on admission) -  hemoglobin A1c 8.5 on 1/10  -On Semglee 24 units daily, sliding scale insulin moderate, added NovoLog 3 units 3 times daily AC    Stage 3 chronic kidney disease ([place])- (present on admission) - CKD stage IIIb, baseline1.4-1.8. -Also had anion gap metabolic acidosis likely due CKD, was placed on IV fluid hydration.   -Creatinine stable, continue current management  Essential hypertension- (present on admission) - BP stable   Myoclonic jerking- right hand - Discussed with ID, ammonia level normal, recent TSH normal -recommended to discontinue cefepime,  will likely take 48 to 72 hours to improve off cefepime  Constipation- (present on admission) - Severe constipation, received enema on 1/15, continue Senokot-S, MiraLAX 17  g twice daily, lactulose 20 g twice daily as needed for constipation -Obtain KUB, may repeat enema versus Movantik  Hyperlipidemia- (present on admission) -Declined statin  sepsis secondary to pneumonia vs. osteomyelitis of rigth great toe - (present on admission) - Met sepsis criteria at the time of admission with leukocytosis, tachycardia, tachypnea, source gangrenous right toe with osteomyelitis.  -  Underwent right great toe amputation on 1/10. -Continue antibiotics per ID recommendations, changed to IV cefazolin and ciprofloxacin today    Subjective: + Right hand jerking noted, no other signs or symptoms of seizures.  No headaches  Objective Vitals:   04/26/21 1406 04/26/21 1421  BP: (!) 120/51 113/61  Pulse: 78 78  Resp: 17 16  Temp:  98.2 F (36.8 C)  SpO2: 99% 100%    Physical Exam General: Alert and oriented x 3, NAD Cardiovascular: S1 S2 clear, RRR. No pedal edema b/l Respiratory: CTAB, no wheezing, rales or rhonchi Gastrointestinal: Soft, nontender, nondistended, NBS Ext: no pedal edema bilaterally Neuro: asterixis noted right hand, unilateral.  No other seizure activity.    Data Reviewed: CBC Latest Ref Rng & Units 04/26/2021 04/21/2021 04/20/2021  WBC 4.0 - 10.5 K/uL 9.1 12.8(H) 15.4(H)  Hemoglobin 13.0 - 17.0 g/dL 13.4 12.7(L) 12.8(L)  Hematocrit 39.0 - 52.0 % 40.2 36.8(L) 36.6(L)  Platelets 150 - 400 K/uL 334 269 243    BMP Latest Ref Rng & Units 04/26/2021 04/22/2021 04/21/2021  Glucose 70 - 99 mg/dL 153(H) 235(H) 203(H)  BUN 8 - 23 mg/dL 16 24(H) 33(H)  Creatinine 0.61 - 1.24 mg/dL 1.38(H) 1.41(H) 1.53(H)  Sodium 135 - 145 mmol/L 136 132(L) 126(L)  Potassium 3.5 - 5.1 mmol/L 4.2 3.6 3.9  Chloride 98 - 111 mmol/L 99 97(L) 96(L)  CO2 22 - 32 mmol/L 30 26 21(L)  Calcium 8.9 - 10.3 mg/dL 8.3(L) 7.8(L) 7.6(L)    Family Communication: Discussed with patient's son on 1/15  Disposition: Status is: Inpatient  Remains inpatient appropriate because: TEE today,  no endocarditis.     Time spent: 38 minutes  Author: Estill Cotta, MD 04/26/2021 2:37 PM  For on call review [email or web address].

## 2021-04-26 NOTE — Plan of Care (Signed)
Attempted to see pt today. He was getting TEE. Pt had right hand jerking, with concern for cefepime causing symptoms. -D/C cefepime -Start cefazolin and ciprofloxacin -Agree with engaging Neurology, hand jerking could be 2/2 worsening epidural abscess, would reach out to neurosurgery with new findings.

## 2021-04-27 DIAGNOSIS — R1010 Upper abdominal pain, unspecified: Secondary | ICD-10-CM

## 2021-04-27 LAB — GLUCOSE, CAPILLARY
Glucose-Capillary: 114 mg/dL — ABNORMAL HIGH (ref 70–99)
Glucose-Capillary: 123 mg/dL — ABNORMAL HIGH (ref 70–99)
Glucose-Capillary: 187 mg/dL — ABNORMAL HIGH (ref 70–99)
Glucose-Capillary: 139 mg/dL — ABNORMAL HIGH (ref 70–99)

## 2021-04-27 MED ORDER — HYDROMORPHONE HCL 1 MG/ML IJ SOLN
1.0000 mg | INTRAMUSCULAR | Status: DC | PRN
  Administered 2021-04-27 – 2021-04-29 (×4): 1 mg via INTRAVENOUS
  Filled 2021-04-27 (×5): qty 1

## 2021-04-27 MED ORDER — NALOXEGOL OXALATE 12.5 MG PO TABS
25.0000 mg | ORAL_TABLET | Freq: Two times a day (BID) | ORAL | Status: DC
  Administered 2021-04-27 – 2021-04-28 (×2): 25 mg via ORAL
  Filled 2021-04-27 (×3): qty 2

## 2021-04-27 MED ORDER — OFLOXACIN 0.3 % OP SOLN
1.0000 [drp] | Freq: Four times a day (QID) | OPHTHALMIC | Status: DC
  Administered 2021-04-27 – 2021-04-29 (×7): 1 [drp] via OPHTHALMIC
  Filled 2021-04-27 (×2): qty 5

## 2021-04-27 NOTE — Progress Notes (Signed)
Occupational Therapy Treatment Patient Details Name: Zachary [name] MRN: [id number] DOB: 04-26-1945 Today's Date: 04/27/2021   History of present illness Pt is a 76 yo male admitted for osteomyelitis of the R great toe. Pt underwent amputation of great toe on 1/10 w skin graft. Pt with MSSA bacteremia. PMH:  DM2, HLD, HTN, PVD, and L great toe amputation.   OT comments  Patient received in bed and agreeable to OT session. Patient required frequent cues to get to EOB and had mild complaints of dizziness. Patient was min assist to transfer to recliner.  LB dressing addressed with doffing and donning socks.  Patient was unable to doff sock and was educated on use of reacher to doff.  Patient instructed on use of sock aid to donn sock and required mod assist to complete. Patient performed standing from recliner but had complaints of back pain. Acute OT to continue to follow.    Recommendations for follow up therapy are one component of a multi-disciplinary discharge planning process, led by the attending physician.  Recommendations may be updated based on patient status, additional functional criteria and insurance authorization.    Follow Up Recommendations  Skilled nursing-short term rehab (<3 hours/day)    Assistance Recommended at Discharge Frequent or constant Supervision/Assistance  Patient can return home with the following  A little help with walking and/or transfers;A lot of help with bathing/dressing/bathroom   Equipment Recommendations  Other (comment) (TBD)    Recommendations for Other Services      Precautions / Restrictions Precautions Precautions: Fall Required Braces or Orthoses: Other Brace Other Brace: R CAM boot Restrictions Weight Bearing Restrictions: Yes RLE Weight Bearing: Weight bearing as tolerated Other Position/Activity Restrictions: WBAT with CAM boot       Mobility Bed Mobility Overal bed mobility: Needs Assistance Bed Mobility: Supine to Sit      Supine to sit: HOB elevated, Min assist     General bed mobility comments: patient required step by steps to use bed rail and to scoot forward on bed    Transfers Overall transfer level: Needs assistance Equipment used: Rolling walker (2 wheels) Transfers: Sit to/from Stand Sit to Stand: Min assist     Step pivot transfers: Min assist     General transfer comment: minimum mobility performed due to complaints of back pain and dizziness     Balance Overall balance assessment: Needs assistance Sitting-balance support: No upper extremity supported, Feet supported Sitting balance-Leahy Scale: Good     Standing balance support: Bilateral upper extremity supported, Reliant on assistive device for balance Standing balance-Leahy Scale: Poor Standing balance comment: Reliant on BUE on RW.                           ADL either performed or assessed with clinical judgement   ADL Overall ADL's : Needs assistance/impaired                     Lower Body Dressing: Moderate assistance;With adaptive equipment Lower Body Dressing Details (indicate cue type and reason): education on reacher for doffing sock and sock aid to donn Toilet Transfer: Minimal assistance;Stand-pivot;Rolling walker (2 wheels) Toilet Transfer Details (indicate cue type and reason): simulated with transfers to recliner           General ADL Comments: patient limited due to pain and complaints of dizziness    Extremity/Trunk Assessment  Vision       Perception     Praxis      Cognition Arousal/Alertness: Awake/alert Behavior During Therapy: WFL for tasks assessed/performed Overall Cognitive Status: Within Functional Limits for tasks assessed                                 General Comments: motivated towards therapy        Exercises      Shoulder Instructions       General Comments BP 129/78 seated in recliner    Pertinent Vitals/ Pain        Pain Assessment Pain Assessment: Faces Faces Pain Scale: Hurts even more Pain Location: back Pain Descriptors / Indicators: Sore, Discomfort, Guarding, Grimacing Pain Intervention(s): Limited activity within patient's tolerance, Monitored during session, Repositioned, RN gave pain meds during session  Home Living                                          Prior Functioning/Environment              Frequency  Min 2X/week        Progress Toward Goals  OT Goals(current goals can now be found in the care plan section)  Progress towards OT goals: Progressing toward goals  Acute Rehab OT Goals Patient Stated Goal: get better OT Goal Formulation: With patient Time For Goal Achievement: 05/05/21 Potential to Achieve Goals: Good ADL Goals Pt Will Perform Lower Body Bathing: with supervision;sit to/from stand Pt Will Perform Lower Body Dressing: with supervision;sit to/from stand Pt Will Transfer to Toilet: with supervision;ambulating;bedside commode;regular height toilet  Plan Discharge plan needs to be updated;Frequency remains appropriate    Co-evaluation                 AM-PAC OT "6 Clicks" Daily Activity     Outcome Measure   Help from another person eating meals?: None Help from another person taking care of personal grooming?: None Help from another person toileting, which includes using toliet, bedpan, or urinal?: A Lot Help from another person bathing (including washing, rinsing, drying)?: A Lot Help from another person to put on and taking off regular upper body clothing?: None Help from another person to put on and taking off regular lower body clothing?: A Lot 6 Click Score: 18    End of Session Equipment Utilized During Treatment: Gait belt;Rolling walker (2 wheels)  OT Visit Diagnosis: Unsteadiness on feet (R26.81);Other abnormalities of gait and mobility (R26.89)   Activity Tolerance Patient limited by pain   Patient Left in  chair;with call bell/phone within reach;with family/visitor present   Nurse Communication Mobility status;Patient requests pain meds (complaints of dizziness)        Time: 0940-1009 OT Time Calculation (min): 29 min  Charges: OT General Charges $OT Visit: 1 Visit OT Treatments $Self Care/Home Management : 8-22 mins $Therapeutic Activity: 8-22 mins  Lodema Hong, OTA Acute Rehabilitation Services  Pager [phone] Office [place] 04/27/2021, 10:27 AM

## 2021-04-27 NOTE — Progress Notes (Signed)
PHARMACY CONSULT NOTE FOR:  OUTPATIENT  PARENTERAL ANTIBIOTIC THERAPY (OPAT)  Indication: MSSA bacteremia/osteomyelitis  Regimen: cefazolin 2g IV q8h End date: 06/14/2021  IV antibiotic discharge orders are pended. To discharging provider:  please sign these orders via discharge navigator,  Select New Orders & click on the button choice - Manage This Unsigned Work.     Thank you for allowing pharmacy to be a part of this patient's care.  Eliseo Gum, PharmD Candidate  04/27/2021 11:16 AM

## 2021-04-27 NOTE — Anesthesia Postprocedure Evaluation (Signed)
Anesthesia Post Note  Patient: Zachary [name]  Procedure(s) Performed: TRANSESOPHAGEAL ECHOCARDIOGRAM (TEE) BUBBLE STUDY     Patient location during evaluation: PACU Anesthesia Type: MAC Level of consciousness: awake and alert Pain management: pain level controlled Vital Signs Assessment: post-procedure vital signs reviewed and stable Respiratory status: spontaneous breathing, nonlabored ventilation and respiratory function stable Cardiovascular status: blood pressure returned to baseline and stable Postop Assessment: no apparent nausea or vomiting Anesthetic complications: no   No notable events documented.  Last Vitals:  Vitals:   04/27/21 0927 04/27/21 1623  BP: (!) 155/86 112/68  Pulse: 81 80  Resp: 18 17  Temp: 36.6 C (!) 36.3 C  SpO2: 99% 94%    Last Pain:  Vitals:   04/27/21 1623  TempSrc: Oral  PainSc:                  Pervis Hocking

## 2021-04-27 NOTE — TOC Progression Note (Signed)
Transition of Care [facility]) - Initial/Assessment Note    Patient Details  Name: CUTBERTO WINFREE MRN: [id number] Date of Birth: [birth year]  [facility]) CM/SW Contact:    Milinda Antis, [place] Phone Number: 04/27/2021, 1:27 PM  Clinical Narrative:                 Insurance authorization has been received.  CSW informed that patient is not medically ready at this time.  Expected Discharge Plan: Skilled Nursing Facility Barriers to Discharge: Insurance Authorization, SNF Pending bed offer   Patient Goals and CMS Choice   CMS Medicare.gov Compare Post Acute Care list provided to:: Patient Choice offered to / list presented to : Patient  Expected Discharge Plan and Services Expected Discharge Plan: [place]       Living arrangements for the past 2 months: Single Family Home                                      Prior Living Arrangements/Services Living arrangements for the past 2 months: Single Family Home Lives with:: Self, Spouse Patient language and need for interpreter reviewed:: Yes Do you feel safe going back to the place where you live?: Yes      Need for Family Participation in Patient Care: Yes (Comment) Care giver support system in place?: Yes (comment)   Criminal Activity/Legal Involvement Pertinent to Current Situation/Hospitalization: No - Comment as needed  Activities of Daily Living Home Assistive Devices/Equipment: None ADL Screening (condition at time of admission) Patient's cognitive ability adequate to safely complete daily activities?: Yes Is the patient deaf or have difficulty hearing?: No Does the patient have difficulty seeing, even when wearing glasses/contacts?: No Does the patient have difficulty concentrating, remembering, or making decisions?: No Patient able to express need for assistance with ADLs?: Yes Does the patient have difficulty dressing or bathing?: No Independently performs ADLs?: Yes (appropriate  for developmental age) Does the patient have difficulty walking or climbing stairs?: Yes Weakness of Legs: Both Weakness of Arms/Hands: None  Permission Sought/Granted   Permission granted to share information with : Yes, Verbal Permission Granted     Permission granted to share info w AGENCY: SNF        Emotional Assessment Appearance:: Appears older than stated age Attitude/Demeanor/Rapport: Engaged Affect (typically observed): Pleasant, Adaptable Orientation: : Oriented to Self, Oriented to Place, Oriented to  Time, Oriented to Situation Alcohol / Substance Use: Not Applicable Psych Involvement: No (comment)  Admission diagnosis:  Bacteremia [R78.81] Dyspnea [R06.00] Osteomyelitis ([place]) [M86.9] Sepsis, due to unspecified organism, unspecified whether acute organ dysfunction present [facility] [A41.9] Patient Active Problem List   Diagnosis Date Noted   Myoclonic jerking- right hand 04/26/2021   Constipation 04/25/2021   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 coli UTI 04/21/2021   MSSA bacteremia 04/19/2021   Osteomyelitis of great toe of right foot ([place]) 04/18/2021   sepsis secondary to pneumonia vs. 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   Headache 03/24/2020   Hyperlipidemia, unspecified 03/24/2020  Hyperlipidemia 03/24/2020   Long term (current) use of insulin ([place]) 03/24/2020   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 ([place]) 03/24/2020   Stage 3 chronic kidney disease ([place]) 03/24/2020   Type 2 diabetes  mellitus with unspecified complications ([place]) 71/29/2909   Gangrenous toe ([place])    Acute osteomyelitis ([place]) 09/13/2019   Quadriceps tendon rupture, left, initial encounter 01/10/2019   Closed comminuted fracture of left patella, initial encounter 01/08/2019   PCP:  [name], FNP Pharmacy:   [id number] - [place], [place] [address] [place] [address] Phone: [phone] Fax: [place] [phone] - [place], [place] [address] Phone: [phone] Fax: [phone]     Social Determinants of Health (SDOH) Interventions    Readmission Risk Interventions No flowsheet data found.

## 2021-04-27 NOTE — Progress Notes (Signed)
Progress Note   Patient: Zachary [name] EPP:[id number] DOB: Mar 01, 1946 DOA: 04/18/2021     8 DOS: the patient was seen and examined on 04/27/2021   Brief hospital course: Patient is a 76 year old male with a history of type 2 diabetes mellitus, HTN, HLD, PVD, BPH presented with bilateral leg pain.He had  aorto-peripheral angiogram on 04/14/21 by IR. After the procedure he reportedly started to have pain in his legs. No bleeding or drainage from procedure site. He also has had right lower back pain for the past few days as well.  Work-up revealed osteomyelitis of both proximal and distal phalanx of the great toe seen on MRI 04/18/2021.   Underwent right great toe amputation on 1/10, podiatry following  Hospital course complicated by MSSA bacteremia, E. coli UTI.  ID consulted Surgical cultures with E. 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  Neurosurgery consulted MRI left foot: Osteomyelitis of the left fourth distal phalanx, podiatry reconsulted 1/16: TEE completed: No endocarditis.  Patient noted to have right hand jerking movements.  Neurology consulted.  Cefepime discontinued, started on IV cefazolin and ciprofloxacin 1/17: Right hand jerking movements improving, complaining of severe constipation  Assessment and Plan * MSSA bacteremia- (present on admission) - Hospital course complicated by MSSA bacteremia, POA -ID consulted.  Switched to cefazolin on 04/19/2021 -2D echo showed EF 40 to 45%, no vegetation. 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  Jerking movements much improved today.  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 Kathyrn Sheriff  recommended continue IV antibiotics and repeat MRI of the lumbar spine w/wo contrast in 2-3 weeks.  Currently no plan for surgical intervention. -Continue pain control  Osteomyelitis of great toe of right foot ([place])- (present on admission) - Underwent amputation of the right great toe, application of skin graft substitute on 1/10 - stable from podiatry standpoint, recommended to leave dressing intact until follow-up outpatient  - Blood cultures 1/9 NTD -Surgical cultures positive for E. 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 Cannon Kettle on 1/13, recommended conservative management and wound care for now. Outpatient follow-up with vascular surgery.   Peripheral vascular disease ([place])- (present on admission) - Recent angiogram to right leg on 04/14/21 by IR, no signs of infection at incision site. He had progressive pain in both legs, Dr Rogers Blocker had discussed with IR, Dr. Annamaria Boots.  - Does not appear to have any pending podiatry procedures, will resume aspirin and Plavix. - Refuses statin. Patient is scheduled to see IR and vascular sx outpatient.   E. coli UTI- (present on admission) UA positive for pyuria, urine culture on 1/8 showed more than 100,000 colonies of E. Coli.   Hyponatremia- (present on admission) -  urine sodium 621, likely SIADH, hold off on IV fluids -Resolved, sodium 136   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 ([place])- (present on admission) -  hemoglobin A1c 8.5 on 1/10  -CBG stable, continue Semglee 24 units daily, moderate SSI, NovoLog 3 units 3 times daily AC   Recent Labs    04/26/21 1134 04/26/21 1421 04/26/21 1647 04/26/21 2239 04/27/21 6063  04/27/21 1121  GLUCAP 171* 129* 177* 108* 114* 123*     Stage 3 chronic kidney disease ([place])- (present on admission) - CKD stage IIIb, baseline1.4-1.8. -Also had anion gap metabolic  acidosis likely due CKD, was placed on IV fluid hydration.   -Creatinine stable  Essential hypertension- (present on admission) - BP stable   Myoclonic jerking- right hand - Patient noted to have right hand myoclonic jerking on 1/16  -Neurology was consulted.  Ammonia level normal.  Recent TSH normal. -Neurology recommended to discontinue cefepime, will likely take 48 to 72 hours to improve off cefepime -Right hand jerking movement appears to be improving today  Constipation- (present on admission) - Severe constipation, received enema on 1/15, continue Senokot-S, MiraLAX 17 g twice daily, lactulose 20 g twice daily as needed for constipation -KUB with no SBO, now placed on Movantik 25 mg twice daily   Hyperlipidemia- (present on admission) -Declined statin  sepsis secondary to pneumonia vs. osteomyelitis of rigth great toe - (present on admission) - Met sepsis criteria at the time of admission with leukocytosis, tachycardia, tachypnea, source gangrenous right toe with osteomyelitis.  -  Underwent right great toe amputation on 1/10. -Continue antibiotics per ID recommendations, changed to IV cefazolin and ciprofloxacin on 1/16     Subjective: Complaining of severe constipation, right hand jerking movements improving.  No fevers or chills.  Still has back pain.  Wife at the bedside  Objective Vitals:   04/27/21 0731 04/27/21 0927  BP: (!) 151/69 (!) 155/86  Pulse: 81 81  Resp: 18 18  Temp: 97.6 F (36.4 C) 97.8 F (36.6 C)  SpO2: 97% 99%    Physical Exam General: Alert and oriented x 3, NAD Cardiovascular: S1 S2 clear, RRR.  Respiratory: CTAB, no wheezing, rales or rhonchi Gastrointestinal: Soft, nontender, nondistended, NBS Ext: no pedal edema bilaterally, right foot dressing intact Neuro: no new deficits, right hand asterixis improving     Data Reviewed:  CBC Latest Ref Rng & Units 04/26/2021 04/21/2021 04/20/2021  WBC 4.0 - 10.5 K/uL 9.1 12.8(H) 15.4(H)   Hemoglobin 13.0 - 17.0 g/dL 13.4 12.7(L) 12.8(L)  Hematocrit 39.0 - 52.0 % 40.2 36.8(L) 36.6(L)  Platelets 150 - 400 K/uL 334 269 243    BMP Latest Ref Rng & Units 04/26/2021 04/22/2021 04/21/2021  Glucose 70 - 99 mg/dL 153(H) 235(H) 203(H)  BUN 8 - 23 mg/dL 16 24(H) 33(H)  Creatinine 0.61 - 1.24 mg/dL 1.38(H) 1.41(H) 1.53(H)  Sodium 135 - 145 mmol/L 136 132(L) 126(L)  Potassium 3.5 - 5.1 mmol/L 4.2 3.6 3.9  Chloride 98 - 111 mmol/L 99 97(L) 96(L)  CO2 22 - 32 mmol/L 30 26 21(L)  Calcium 8.9 - 10.3 mg/dL 8.3(L) 7.8(L) 7.6(L)     Family Communication: Updated wife at the bedside  Disposition: Status is: Inpatient  Remains inpatient appropriate because: On IV antibiotics, work-up in progress, + severe constipation    Time spent: 37 minutes  Author: Estill Cotta, MD 04/27/2021 4:13 PM  For on call review [email or web address].

## 2021-04-28 ENCOUNTER — Ambulatory Visit: Payer: Medicare PPO | Admitting: Podiatry

## 2021-04-28 ENCOUNTER — Encounter (HOSPITAL_COMMUNITY): Payer: Self-pay | Admitting: Cardiovascular Disease

## 2021-04-28 LAB — RESP PANEL BY RT-PCR (FLU A&B, COVID) ARPGX2
Influenza A by PCR: NEGATIVE
Influenza B by PCR: NEGATIVE
SARS Coronavirus 2 by RT PCR: NEGATIVE

## 2021-04-28 LAB — GLUCOSE, CAPILLARY
Glucose-Capillary: 132 mg/dL — ABNORMAL HIGH (ref 70–99)
Glucose-Capillary: 196 mg/dL — ABNORMAL HIGH (ref 70–99)
Glucose-Capillary: 162 mg/dL — ABNORMAL HIGH (ref 70–99)
Glucose-Capillary: 100 mg/dL — ABNORMAL HIGH (ref 70–99)

## 2021-04-28 LAB — BASIC METABOLIC PANEL
Anion gap: 7 (ref 5–15)
BUN: 16 mg/dL (ref 8–23)
CO2: 31 mmol/L (ref 22–32)
Calcium: 8.4 mg/dL — ABNORMAL LOW (ref 8.9–10.3)
Chloride: 98 mmol/L (ref 98–111)
Creatinine, Ser: 1.4 mg/dL — ABNORMAL HIGH (ref 0.61–1.24)
GFR, Estimated: 52 mL/min — ABNORMAL LOW (ref >60)
Glucose, Bld: 109 mg/dL — ABNORMAL HIGH (ref 70–99)
Potassium: 3.9 mmol/L (ref 3.5–5.1)
Sodium: 136 mmol/L (ref 135–145)

## 2021-04-28 MED ORDER — CEFAZOLIN IV (FOR PTA / DISCHARGE USE ONLY): 2.0000 g | Freq: Three times a day (TID) | INTRAVENOUS | 0 refills | Status: AC

## 2021-04-28 MED ORDER — INSULIN ASPART 100 UNIT/ML IJ SOLN: 3.0000 [IU] | Freq: Three times a day (TID) | INTRAMUSCULAR | 11 refills | Status: DC

## 2021-04-28 MED ORDER — LACTULOSE 10 GM/15ML PO SOLN: 20.0000 g | Freq: Two times a day (BID) | ORAL | 0 refills | Status: DC | PRN

## 2021-04-28 MED ORDER — INSULIN GLARGINE-YFGN 100 UNIT/ML ~~LOC~~ SOLN: 24.0000 [IU] | Freq: Every day | SUBCUTANEOUS | 11 refills | Status: DC

## 2021-04-28 MED ORDER — SENNOSIDES-DOCUSATE SODIUM 8.6-50 MG PO TABS: 2.0000 | ORAL_TABLET | Freq: Two times a day (BID) | ORAL | Status: DC

## 2021-04-28 MED ORDER — CIPROFLOXACIN HCL 500 MG PO TABS: 500.0000 mg | ORAL_TABLET | Freq: Two times a day (BID) | ORAL | Status: DC

## 2021-04-28 MED ORDER — POLYETHYLENE GLYCOL 3350 17 G PO PACK: 17.0000 g | PACK | Freq: Two times a day (BID) | ORAL | 0 refills | Status: AC

## 2021-04-28 NOTE — Progress Notes (Signed)
Physical Therapy Treatment Patient Details Name: Zachary [name] MRN: [id number] DOB: [birth year] Today's Date: 04/28/2021   History of Present Illness Pt is a 76 yo male admitted for osteomyelitis of the R great toe. Pt underwent amputation of great toe on 1/10 w skin graft. Pt with MSSA bacteremia. PMH:  DM2, HLD, HTN, PVD, and L great toe amputation.    PT Comments    Pt is making slower progress towards his physical therapy goals, but remains motivated to participate. Session focused on bed level exercises, transfer and gait training. Pt requiring mod-max assist for transfers. Ambulating 5 ft, then 10 ft, with a walker and close chair follow. Unfortunately, this is still a higher level of assist than pt wife is able to provide. Continue to recommend SNF for ongoing Physical Therapy.      Recommendations for follow up therapy are one component of a multi-disciplinary discharge planning process, led by the attending physician.  Recommendations may be updated based on patient status, additional functional criteria and insurance authorization.  Follow Up Recommendations  Skilled nursing-short term rehab (<3 hours/day)     Assistance Recommended at Discharge Intermittent Supervision/Assistance  Patient can return home with the following A lot of help with walking and/or transfers;A little help with bathing/dressing/bathroom;Assistance with cooking/housework;Help with stairs or ramp for entrance   Equipment Recommendations  Wheelchair (measurements PT);Wheelchair cushion (measurements PT)    Recommendations for Other Services       Precautions / Restrictions Precautions Precautions: Fall;Other (comment) Precaution Comments: urinary frequency Other Brace: R CAM boot Restrictions Weight Bearing Restrictions: Yes RLE Weight Bearing: Weight bearing as tolerated Other Position/Activity Restrictions: WBAT with CAM boot     Mobility  Bed Mobility Overal bed mobility: Needs  Assistance Bed Mobility: Supine to Sit     Supine to sit: Supervision     General bed mobility comments: able to progress EOB with considerable time/effort without physical assist, use of bed rail, HOB elevated    Transfers Overall transfer level: Needs assistance Equipment used: Rolling walker (2 wheels) Transfers: Sit to/from Stand, Bed to chair/wheelchair/BSC Sit to Stand: Mod assist, Max assist   Step pivot transfers: Min assist       General transfer comment: Pt unable to rise from lower bed surface. Elevated bed height, and pt able to stand up with modA. Cues for scooting out to edge and hand placement.    Ambulation/Gait Ambulation/Gait assistance: Min assist Gait Distance (Feet): 15 Feet (5 ft, then 10 ft) Assistive device: Rolling walker (2 wheels) Gait Pattern/deviations: Step-through pattern, Antalgic Gait velocity: decreased Gait velocity interpretation: <1.8 ft/sec, indicate of risk for recurrent falls   General Gait Details: Pt ambulating 5 ft, then 10 ft with minA and close chair follow. Fatigues easily   [employer]ine [employer] Rankin (Stroke Patients Only)       Balance Overall balance assessment: Needs assistance Sitting-balance support: No upper extremity supported, Feet supported Sitting balance-Leahy Scale: Good     Standing balance support: Bilateral upper extremity supported, Reliant on assistive device for balance Standing balance-Leahy Scale: Poor Standing balance comment: Reliant on BUE on RW.                            Cognition Arousal/Alertness: Awake/alert Behavior During Therapy: WFL for tasks assessed/performed Overall Cognitive Status: Within Functional Limits for tasks assessed  Exercises General Exercises - Lower Extremity Long Arc Quad: Both, 10 reps, Seated Heel Slides: Both, 10 reps, Supine Straight Leg Raises:  Both, 10 reps, Supine    General Comments        Pertinent Vitals/Pain Pain Assessment Pain Assessment: Faces Faces Pain Scale: Hurts even more Pain Location: back Pain Descriptors / Indicators: Sore, Discomfort, Guarding, Grimacing Pain Intervention(s): Limited activity within patient's tolerance, Monitored during session    Home Living                          Prior Function            PT Goals (current goals can now be found in the care plan section) Acute Rehab PT Goals Patient Stated Goal: go home Potential to Achieve Goals: Good Progress towards PT goals: Progressing toward goals    Frequency    Min 3X/week      PT Plan Current plan remains appropriate    Co-evaluation              AM-PAC PT "6 Clicks" Mobility   Outcome Measure  Help needed turning from your back to your side while in a flat bed without using bedrails?: A Little Help needed moving from lying on your back to sitting on the side of a flat bed without using bedrails?: A Little Help needed moving to and from a bed to a chair (including a wheelchair)?: A Little Help needed standing up from a chair using your arms (e.g., wheelchair or bedside chair)?: A Lot Help needed to walk in hospital room?: Total Help needed climbing 3-5 steps with a railing? : Total 6 Click Score: 13    End of Session Equipment Utilized During Treatment: Gait belt;Other (comment) (CAM boot) Activity Tolerance: Patient tolerated treatment well Patient left: in chair;with call bell/phone within reach Nurse Communication: Mobility status PT Visit Diagnosis: Unsteadiness on feet (R26.81);Muscle weakness (generalized) (M62.81);Difficulty in walking, not elsewhere classified (R26.2);Pain Pain - Right/Left: Right Pain - part of body: Ankle and joints of foot     Time: 5945-8592 PT Time Calculation (min) (ACUTE ONLY): 21 min  Charges:  $Therapeutic Activity: 8-22 mins                     Wyona Almas, PT, DPT Acute Rehabilitation Services Pager [phone] Office [phone]    Deno Etienne 04/28/2021, 3:09 PM

## 2021-04-28 NOTE — Discharge Summary (Addendum)
Physician Discharge Summary  [name] XTG:[id number] DOB: 01-Jan-1946 DOA: 04/18/2021  PCP: [name], FNP  Admit date: 04/18/2021 Discharge date: 04/29/2021  Admitted From: home  Disposition:  SNF   Recommendations for Outpatient Follow-up:  Patient will need f/u with podiatry, Dr Cannon Kettle and ID Dr Candiss Norse    Discharge Condition:  stable   CODE STATUS:  DNR   Diet recommendation:  heart healthy, diabetic  Consultants:  Neurology Neurosurgery ID Procedures:  2D echo TEE   Discharge Diagnoses:  Principal Problem:   MSSA bacteremia Active Problems:   Essential hypertension   Hyperlipidemia   Stage 3 chronic kidney disease ([place])   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 osteomyelitis of rigth great toe    Hyponatremia   E. coli UTI   Epidural abscess   Constipation   Myoclonic jerking- right hand     Brief Summary: Zachary [name] a 76 year old male with a history of type 2 diabetes mellitus, HTN, HLD, PVD, BPH presented with bilateral leg pain.He had  aorto-peripheral angiogram on 04/14/21 by IR. After the procedure he reportedly started to have pain in his legs. No bleeding or drainage from procedure site. He also has had right lower back pain for the past few days as well.  Work-up revealed osteomyelitis of both proximal and distal phalanx of the great toe seen on MRI 04/18/2021.   Underwent right great toe amputation on 1/10, podiatry following   Hospital course complicated by MSSA bacteremia, E. coli UTI.  ID consulted Surgical cultures with E. 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  Neurosurgery consulted MRI left foot: Osteomyelitis of the left fourth distal phalanx, podiatry reconsulted 1/16: TEE completed: No endocarditis.  Patient noted to have right hand jerking  movements.  Neurology consulted.  Cefepime discontinued, started on IV cefazolin and ciprofloxacin 1/17: Right hand jerking movements improving, complaining of severe constipation    Hospital Course:  MSSA bacteremia - Appreciate ID consult - No vegetations noted on 2D echo and on TEE - Patient was also receiving IV cefepime and this was discontinued on 1/16 due to jerking of the right hand-neurology was consulted at this time and did recommend to discontinue cefepime   Active Problems:   Epidural abscess -No surgical intervention at this time - Neurosurgery evaluated the patient on 1/13> Recommended continuing IV antibiotics per ID- Also recommended a repeat MRI in 2 to 3 weeks    Right great toe osteomyelitis - 1/10-underwent amputation and application of a skin graft by podiatry -Note MRI of the left foot showed osteomyelitis of the fourth distal phalanx and the patient was evaluated by Dr. Cannon Kettle on 1/13 and again on 1/14-after discussion with Dr. [name], Dr. Cannon Kettle felt to continue antibiotics and local wound care for the left fourth toe-may reconsider options in the future if the foot toe fails to improve after he is seen by Dr. [name] - Surgical cultures positive for E. coli, Klebsiella and few staff aureus - Antibiotics per ID  Sepsis present on admission- due to above -Currently receiving cefazolin and oral ciprofloxacin per ID recommendations with plan to stop on 06/14/21 - follow these labs weekly while on IV Abx - CBC with differential __ CMP __ CRP __ ESR -PT is recommending SNF   Peripheral vascular disease - Angiogram on right leg was  performed on 04/14/2021 by IR -findings>AT is occluded in the first third PT was occluded proximally on initial, In-line flow into the pedal arteries/forefoot on conclusion.  -Continue aspirin and Plavix   Essential hypertension  -Continue diltiazem    E. coli UTI - Pyuria noted on admission - Urine culture grew out greater than  100,000 colonies of E. coli - Has been treated   Diabetes mellitus type 2 with unspecified complications - C9O noted to be 8.5 on 1/10 - Uses Soliqua as outpt - continue Semglee and NovoLog 3 units 3 times daily as he is using in the hospital     Stage 3 chronic kidney disease B -Stable      Constipation - Placed on MiraLAX twice a day, senna 2 times a day and lactulose 2 times a day as needed - Follow stool output   Moderate to severe calcified mitral stenosis - Noted on TEE on 1/16       Discharge Exam: Vitals:   04/28/21 1646 04/29/21 0459  BP: (!) 141/69 (!) 146/64  Pulse: 85 76  Resp: 18 18  Temp: 98.2 F (36.8 C) 98 F (36.7 C)  SpO2: 94% 98%   Vitals:   04/28/21 0504 04/28/21 0835 04/28/21 1646 04/29/21 0459  BP: (!) 142/66 (!) 123/56 (!) 141/69 (!) 146/64  Pulse: 77 86 85 76  Resp: _0 Temp: 97.7 F (36.5 C) 98.3 F (36.8 C) 98.2 F (36.8 C) 98 F (36.7 C)  TempSrc: Oral  Oral Oral  SpO2: 97% 93% 94% 98%  Weight:      Height:        General: Pt is alert, awake, not in acute distress Cardiovascular: RRR, S1/S2 +, no rubs, no gallops Respiratory: CTA bilaterally, no wheezing, no rhonchi Abdominal: Soft, NT, ND, bowel sounds + Extremities: no edema, no cyanosis   Discharge Instructions  Discharge Instructions     [facility] pharmacist to adjust dose for Vancomycin, Aminoglycosides and other anti-infective therapies as requested by physician.   Complete by: As directed    [facility] to provide Cath Flo 72m   Complete by: As directed    Administer for PICC line occlusion and as ordered by physician for other access device issues.   Anaphylaxis Kit: Provided to treat any anaphylactic reaction to the medication being provided to the patient if First Dose or when requested by physician   Complete by: As directed    Epinephrine 171mml vial / amp: Administer 0.94m46m0.94ml76mubcutaneously once for moderate to severe  anaphylaxis, nurse to call physician and pharmacy when reaction occurs and call 911 if needed for immediate care   Diphenhydramine 50mg45mIV vial: Administer 25-50mg 30mM PRN for first dose reaction, rash, itching, mild reaction, nurse to call physician and pharmacy when reaction occurs   Sodium Chloride 0.9% NS 500ml I28mdminister if needed for hypovolemic blood pressure drop or as ordered by physician after call to physician with anaphylactic reaction   Change dressing on IV access line weekly and PRN   Complete by: As directed    Flush IV access with Sodium Chloride 0.9% and Heparin 10 units/ml or 100 units/ml   Complete by: As directed    Home infusion instructions - [facility]   Complete by: As directed    Instructions: Flush IV access with Sodium Chloride 0.9% and Heparin 10units/ml or 100units/ml   Change dressing on IV access line: Weekly and PRN   Instructions Cath Flo  30m: Administer for PICC Line occlusion and as ordered by physician for other access device   [facility] pharmacist to adjust dose for: Vancomycin, Aminoglycosides and other anti-infective therapies as requested by physician   Method of administration may be changed at the discretion of home infusion pharmacist based upon assessment of the patient and/or caregivers ability to self-administer the medication ordered   Complete by: As directed       Allergies as of 04/29/2021       Reactions   Metformin Hcl Other (See Comments)   Unknown reaction   Pravastatin Sodium Other (See Comments)   Muscle pain         Medication List     STOP taking these medications    doxycycline 100 MG tablet Commonly known as: VIBRA-TABS   Santyl ointment Generic drug: collagenase   Soliqua 100-33 UNT-MCG/ML Sopn Generic drug: Insulin Glargine-Lixisenatide       TAKE these medications    acetaminophen 500 MG tablet Commonly known as: TYLENOL Take 1,000 mg by mouth every 8 (eight) hours as  needed for moderate pain.   aspirin EC 81 MG tablet Take 81 mg by mouth daily.   B-D ULTRAFINE III SHORT PEN 31G X 8 MM Misc Generic drug: Insulin Pen Needle Inject into the skin 5 (five) times daily.   calcium carbonate 500 MG chewable tablet Commonly known as: TUMS - dosed in mg elemental calcium Chew 1,000 mg by mouth daily as needed for indigestion or heartburn.   ceFAZolin  IVPB Commonly known as: ANCEF Inject 2 g into the vein every 8 (eight) hours. Indication: MSSA bacteremia/osteomyelitis  First Dose: Yes Last Day of Therapy:  06/14/2021 Labs - Once weekly:  CBC/D and BMP, Labs - Every other week:  ESR and CRP Method of administration: IV Push Method of administration may be changed at the discretion of home infusion pharmacist based upon assessment of the patient and/or caregiver's ability to self-administer the medication ordered.   ciprofloxacin 500 MG tablet Commonly known as: CIPRO Take 1 tablet (500 mg total) by mouth 2 (two) times daily.   clopidogrel 75 MG tablet Commonly known as: PLAVIX Take 75 mg by mouth daily.   diltiazem 120 MG 24 hr capsule Commonly known as: CARDIZEM CD Take 120 mg by mouth daily.   freestyle lancets AS DIRECTED THREE TIMES A DAY   FREESTYLE LITE test strip Generic drug: glucose blood TEST 3 TIMES A DAY E11.9   insulin aspart 100 UNIT/ML injection Commonly known as: novoLOG Inject 3 Units into the skin 3 (three) times daily with meals.   insulin glargine-yfgn 100 UNIT/ML injection Commonly known as: SEMGLEE Inject 0.24 mLs (24 Units total) into the skin daily.   lactulose 10 GM/15ML solution Commonly known as: CHRONULAC Take 30 mLs (20 g total) by mouth 2 (two) times daily as needed for mild constipation or moderate constipation.   polyethylene glycol 17 g packet Commonly known as: MIRALAX / GLYCOLAX Take 17 g by mouth 2 (two) times daily.   senna-docusate 8.6-50 MG tablet Commonly known as: Senokot-S Take 2 tablets by  mouth 2 (two) times daily.               Discharge Care Instructions  (From admission, onward)           Start     Ordered   04/28/21 0000  Change dressing on IV access line weekly and PRN  (Home infusion instructions - [facility] )  04/28/21 1545            Allergies  Allergen Reactions   Metformin Hcl Other (See Comments)    Unknown reaction   Pravastatin Sodium Other (See Comments)    Muscle pain       DG Abd 1 View  Result Date: 04/26/2021 CLINICAL DATA:  Constipation. EXAM: ABDOMEN - 1 VIEW COMPARISON:  11/26/2007 FINDINGS: Diffuse gaseous distention of small bowel and colon evident with gas and stool visualized in the rectum. There is moderate gaseous distention of the stomach. No substantial stool volume evident. Degenerative changes noted lumbar spine. IMPRESSION: Nonspecific bowel gas pattern with diffuse mild gaseous distention of small bowel and colon. Electronically Signed   By: Misty Stanley M.D.   On: 04/26/2021 16:24   MR LUMBAR SPINE WO CONTRAST  Result Date: 04/23/2021 CLINICAL DATA:  Initial evaluation for acute low back pain, infection suspected. History of MSSA bacteremia. EXAM: MRI LUMBAR SPINE WITHOUT CONTRAST TECHNIQUE: Multiplanar, multisequence MR imaging of the lumbar spine was performed. No intravenous contrast was administered. COMPARISON:  None available. FINDINGS: Segmentation:  Examination degraded by motion artifact. Standard segmentation. Lowest well-formed disc space labeled the L5-S1 level. Alignment: Physiologic with preservation of the normal lumbar lordosis. No listhesis. Vertebrae: Vertebral body height maintained without acute or chronic fracture. Bone marrow signal intensity within normal limits. Reactive endplate change about the L2-3 and L4-5 interspaces favored to be degenerative in nature. Endplate irregularity with reactive endplate change present about the L4-5 interspace. Minimal edema within the intervening  L4-5 disc itself. Findings are favored to be degenerative in nature. Similarly, mild reactive endplate change about the L2-3 interspace also favored to be degenerative. There is reactive marrow edema with small joint effusion about the left L4-5 facet (series 3, image 11). Diffuse edema throughout the left greater than right posterior paraspinous soft tissues. While these findings could potentially be degenerative, possible septic arthritis could be considered given provided history. Conus medullaris and cauda equina: Conus extends to the T12-L1 level. Conus medullaris within normal limits. There is an irregular lobulated epidural collection involving the dorsal and left epidural space extending from T12 through L4-5 (series 3, image 9). Finding is concerning for epidural abscess. This is most pronounced at the levels of L3-4 and L4-5 where this collection measures up to approximately 7 mm in maximal AP diameter (series 5, image 16). Proximal nerve roots of the cauda equina are somewhat irregular due to distal stenosis. Paraspinal and other soft tissues: Diffuse edema throughout the left greater than right posterior paraspinous soft tissues as above. No visible soft tissue collections. Prominent distension of the visualized urinary bladder. Disc levels: T12-L1: Seen only on sagittal projection. Negative interspace. Epidural abscess at the posterior aspect of the thecal sac with resultant mild spinal stenosis. Foramina remain patent. L1-2: Disc bulge with disc desiccation and reactive endplate spurring. Mild facet hypertrophy. Epidural abscess involving the dorsal and lateral epidural space (series 5, image 5). Resultant mild-to-moderate spinal stenosis. Foramina remain patent. L2-3: Degenerative intervertebral disc space narrowing with diffuse disc bulge and disc desiccation. Mild reactive endplate spurring. Advanced bilateral facet arthrosis. Probable small volume epidural abscess posteriorly. Resultant severe  canal with bilateral subarticular stenosis. Moderate right with mild left L2 foraminal narrowing. L3-4: Disc bulge with disc desiccation and reactive endplate spurring. Moderate bilateral facet hypertrophy. Epidural abscess posteriorly, slightly greater on the right (series 5, image 22). Resultant fairly severe spinal stenosis. Mild bilateral L3 foraminal narrowing. L4-5: Degenerative intervertebral disc space narrowing with diffuse  disc bulge and disc desiccation. Discogenic reactive endplate change with marginal endplate osteophytic spurring. Probable superimposed left foraminal to extraforaminal disc protrusion (series 5, image 24). Moderate to advanced left greater than right facet arthrosis with joint effusion and marrow edema on the left, concerning for possible septic arthritis. Epidural abscess posteriorly (series 5, image 24). Resultant moderate to severe canal with severe left lateral recess stenosis. Severe left with mild to moderate right L4 foraminal narrowing. L5-S1: Disc bulge with disc desiccation. Superimposed central disc protrusion with annular fissure. Small volume epidural abscess at the left posterior epidural space (series 5, image 30). Mild to moderate facet hypertrophy. Resultant moderate narrowing of the left lateral recess. Central canal remains patent. No significant foraminal encroachment. IMPRESSION: 1. Abnormal epidural collection extending from T12 through L4-5, concerning for epidural abscess given provided history. Resultant diffuse spinal stenosis throughout the lumbar spine, moderate to severe in nature at L2-3 through L4-5 as above. 2. Reactive marrow edema with joint effusion about the left L4-5 facet, concerning for possible septic arthritis, and possibly the source of the epidural collection. Associated diffuse edema throughout the left greater than right posterior paraspinous soft tissues. No discrete soft tissue collection. 3. Underlying multilevel degenerative spondylosis  and facet arthrosis as above, moderate to advanced at L2-3 and L4-5. 4. Prominent distension of the visualized urinary bladder. Electronically Signed   By: Jeannine Boga M.D.   On: 04/23/2021 02:38   IR Angiogram Extremity Right  Result Date: 04/14/2021 INDICATION: 76 year old male with a history of right great toe wound, diabetic foot wound, worsening, presents for angiogram and possible therapy EXAM: ULTRASOUND-GUIDED ACCESS RIGHT COMMON FEMORAL ARTERY RIGHT LOWER EXTREMITY ANGIOGRAM BALLOON ANGIOPLASTY OF RIGHT POSTERIOR TIBIAL ARTERY ANGIO-SEAL FOR HEMOSTASIS MEDICATIONS: 9000 units IV heparin.  650 mg p.o. aspirin ANESTHESIA/SEDATION: Moderate (conscious) sedation was employed during this procedure. A total of Versed 3.0 mg and Fentanyl 100 mcg was administered intravenously. Moderate Sedation Time: 116 minutes. The patient's level of consciousness and vital signs were monitored continuously by radiology nursing throughout the procedure under my direct supervision. CONTRAST:  5m OMNIPAQUE IOHEXOL 300 MG/ML  SOLN FLUOROSCOPY TIME:  Fluoroscopy Time: 19 minutes 54 seconds COMPLICATIONS: None PROCEDURE: Informed consent was obtained from the patient following explanation of the procedure, risks, benefits and alternatives. The patient understands, agrees and consents for the procedure. All questions were addressed. A time out was performed prior to the initiation of the procedure. Maximal barrier sterile technique utilized including caps, mask, sterile gowns, sterile gloves, large sterile drape, hand hygiene, and Betadine prep. Ultrasound survey of the right inguinal region was performed with images stored and sent to PACs, confirming patency of the right common femoral artery. 1% lidocaine was used for local anesthesia. Stab incision was made with 11 blade scalpel. Blunt dissection was performed under ultrasound guidance. A micropuncture needle was used access the common femoral artery under  ultrasound. With excellent arterial blood flow returned, and an .018 micro wire was passed through the needle, observed enter the SFA under fluoroscopy. The needle was removed, and a micropuncture sheath was placed over the wire. The inner dilator and wire were removed, and an 035 Bentson wire was advanced under fluoroscopy into the SFA. The sheath was removed and a standard 5 [place] sheath was placed. The dilator was removed and the sheath was flushed. Angiogram of the right lower extremity was performed with CO2, including the femoropopliteal segment, with then a placement a Kumpe the catheter into the distal SFA for continuation of  the tibial and pedal vessels. IV heparinization was then performed. Bentson wire was placed in the distal SFA and the 5 French sheath was exchanged for a standard 6 French 45 cm straight tip sheath. Kumpe catheter was used to select the origin of the anterior tibial artery. 018 command wire was advanced into the anterior tibial artery through the first third. The Kumpe the catheter was removed and an 018 quick cross crossing catheter was passed on the command wire. Combination of the command wire and the crossing catheter were passed into the distal calf. Wire was removed and a gentle injection was performed demonstrating subintimal location. We withdrew from the attempt at the anterior tibial artery revascularization and targeted the posterior tibial artery. The quick cross catheter and the command wire were used to select the origin of posterior tibial artery that was interpreted to be the origin. The command wire and the crossing catheter were passed into the mid segment when resistance was encountered. Wire was removed and a gentle injection was performed demonstrating a subintimal location. The catheter wire were then withdrawn into the sheath, and we elected to perform access of the posterior tibial artery. Micro puncture was used to access the posterior tibial artery  directed retrograde. A V18 wire was passed into the lumen of the posterior tibial artery, and was advanced easily through the posterior tibial artery into the popliteal artery. The needle was removed from the wire and then 018 crossing catheter was advanced on the wire through the posterior tibial artery, into the popliteal artery. A rendezvous was then performed with a angled Kumpe the catheter within the distal SFA, and the V18 wire was externalized. The Kumpe the catheter was removed. The 018 quick cross catheter was removed from the retrograde tibial access and then passed antegrade through the length of the femoropopliteal system and the tibial artery. Once the catheter was within the distal posterior tibial artery, the V18 wire was removed. 014 whisper wire was then advanced antegrade through the crossing catheter used to select the lateral plantar artery. The whisper wire was advanced through the pedal loop into the distal dorsalis pedis. 2.5 mm balloon angioplasty was then performed along the length of the posterior tibial artery, through the common plantar artery, and into the lateral plantar artery. 2 mm balloon was then advanced, for angioplasty through the lateral plantar artery, the pedal loop, and into the distal dorsalis pedis. Upon inflation of the 2 mm balloon within the lateral plantar artery, a balloon rupture occurred with extravasation of contrast. 2.5 mm balloon was then inflated at low atmospheric pressure for 1 minute to achieve hemostasis. Balloon was then removed. Repeat angiogram was performed with CO2 and small volume contrast. Repeat 2 mm balloon angioplasty was then performed through the pedal loop. Final angiogram was performed through the lower extremity. All catheters and wires were removed. Six [place] Angio-Seal was deployed for hemostasis. Patient tolerated the procedure well and remained hemodynamically stable throughout. No complications were encountered and no significant blood  loss. FINDINGS: Initial ultrasound demonstrates patent common femoral artery. Angiogram demonstrates patent femoropopliteal system with no recurrent high-grade stenosis. 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 Continuous tibioperoneal trunk and peroneal artery through its entire length to the ankle. There is communication to the calcaneal arteries posteriorly and to tarsal arteries anteriorly. Occluded posterior tibial artery on the ischial angiogram. After treatment the posterior tibial artery, there is in-line flow through the posterior tibial artery, common plantar artery, and the medial and lateral  calcaneal artery to the forefoot. 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 IMPRESSION: Status post ultrasound guided access right common femoral artery and the right posterior tibial artery for treatment of occluded posterior tibial artery with standard balloon angioplasty, restoring in-line flow into the forefoot through the pedal loop. Angio-Seal for hemostasis. Signed, Dulcy Fanny. Dellia Nims, RPVI [facility] Electronically Signed   By: Corrie Mckusick D.O.   On: 04/14/2021 16:31   MR FOOT RIGHT WO CONTRAST  Result Date: 04/19/2021 CLINICAL DATA:  76 year old male with type 2 diabetes and peripheral vascular disease. Aorto-peripheral angiogram by Interventional Radiology 04/14/2021 with subsequent pain in legs. Evaluate for osteomyelitis. EXAM: MRI OF THE RIGHT FOREFOOT WITHOUT CONTRAST TECHNIQUE: Multiplanar, multisequence MR imaging of the right forefoot was performed. No intravenous contrast was administered. COMPARISON:  Right toes radiographs 04/18/2021 FINDINGS: Bones/Joint/Cartilage Patient motion artifact mildly to moderately technical degrades this examination. Within this limitation, there is diffuse decreased T1 and increased T2 signal throughout the distal phalanx of the great toe and the distal  greater than proximal aspect of the proximal phalanx of the great toe. There is thinning and ulceration of the medial great toe soft tissues, greatest at the interphalangeal joint and distal phalanx. There is diffuse but mild erosion of the medial cortex of the distal phalanx and the distal medial cortex of the proximal phalanx. Findings are highly concerning for acute osteomyelitis. Moderate degenerative changes of the great toe metatarsophalangeal joint. 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 Ligaments The visualized flexor and extensor tendons are intact. Muscles and Tendons Mild-to-moderate nonspecific myositis of the plantar and intrinsic musculature. Soft tissues No walled-off abscess is seen. IMPRESSION: Findings concerning for osteomyelitis of the distal phalanx greater than the proximal phalanx of the great toe, as described above. Electronically Signed   By: Yvonne Kendall   On: 04/19/2021 08:13   MR FOOT LEFT WO CONTRAST  Result Date: 04/22/2021 CLINICAL DATA:  Bacteremia.  History of osteomyelitis. EXAM: MRI OF THE LEFT FOOT WITHOUT CONTRAST TECHNIQUE: Multiplanar, multisequence MR imaging of the left foot was performed. No intravenous contrast was administered. COMPARISON:  Left foot x-rays dated April 18, 2021. FINDINGS: Bones/Joint/Cartilage Prior first and second toe amputations. Confluent marrow edema with corresponding decreased T1 marrow signal involving the fourth distal phalanx. No acute fracture or dislocation. Chronic fracture deformity of the third proximal phalanx. No joint effusion. Ligaments Collateral ligaments are intact.  Lisfranc ligament is intact. Muscles and Tendons Postsurgical changes of the first and second flexor and extensor tendons. Flexor and extensor tendons are otherwise intact. No tenosynovitis. Increased T2 signal within and atrophy of the intrinsic muscles of the forefoot, nonspecific, but likely  related to diabetic muscle changes. Soft tissue Soft tissue ulceration at the tip of the fourth toe extending to bone. No fluid collection. No soft tissue mass. IMPRESSION: 1. Soft tissue ulceration at the tip of the fourth toe extending to bone. Underlying osteomyelitis of the fourth distal phalanx. No abscess. Electronically Signed   By: Titus Dubin M.D.   On: 04/22/2021 21:53   IR TIB-PERO ART PTA MOD SED  Result Date: 04/14/2021 INDICATION: 76 year old male with a history of right great toe wound, diabetic foot wound, worsening, presents for angiogram and possible therapy EXAM: ULTRASOUND-GUIDED ACCESS RIGHT COMMON FEMORAL ARTERY RIGHT LOWER EXTREMITY ANGIOGRAM BALLOON ANGIOPLASTY OF RIGHT POSTERIOR TIBIAL ARTERY ANGIO-SEAL FOR HEMOSTASIS MEDICATIONS: 9000 units IV heparin.  650 mg p.o.  aspirin ANESTHESIA/SEDATION: Moderate (conscious) sedation was employed during this procedure. A total of Versed 3.0 mg and Fentanyl 100 mcg was administered intravenously. Moderate Sedation Time: 116 minutes. The patient's level of consciousness and vital signs were monitored continuously by radiology nursing throughout the procedure under my direct supervision. CONTRAST:  23m OMNIPAQUE IOHEXOL 300 MG/ML  SOLN FLUOROSCOPY TIME:  Fluoroscopy Time: 19 minutes 54 seconds COMPLICATIONS: None PROCEDURE: Informed consent was obtained from the patient following explanation of the procedure, risks, benefits and alternatives. The patient understands, agrees and consents for the procedure. All questions were addressed. A time out was performed prior to the initiation of the procedure. Maximal barrier sterile technique utilized including caps, mask, sterile gowns, sterile gloves, large sterile drape, hand hygiene, and Betadine prep. Ultrasound survey of the right inguinal region was performed with images stored and sent to PACs, confirming patency of the right common femoral artery. 1% lidocaine was used for local anesthesia. Stab  incision was made with 11 blade scalpel. Blunt dissection was performed under ultrasound guidance. A micropuncture needle was used access the common femoral artery under ultrasound. With excellent arterial blood flow returned, and an .018 micro wire was passed through the needle, observed enter the SFA under fluoroscopy. The needle was removed, and a micropuncture sheath was placed over the wire. The inner dilator and wire were removed, and an 035 Bentson wire was advanced under fluoroscopy into the SFA. The sheath was removed and a standard 5 [place] sheath was placed. The dilator was removed and the sheath was flushed. Angiogram of the right lower extremity was performed with CO2, including the femoropopliteal segment, with then a placement a Kumpe the catheter into the distal SFA for continuation of the tibial and pedal vessels. IV heparinization was then performed. Bentson wire was placed in the distal SFA and the 5 French sheath was exchanged for a standard 6 French 45 cm straight tip sheath. Kumpe catheter was used to select the origin of the anterior tibial artery. 018 command wire was advanced into the anterior tibial artery through the first third. The Kumpe the catheter was removed and an 018 quick cross crossing catheter was passed on the command wire. Combination of the command wire and the crossing catheter were passed into the distal calf. Wire was removed and a gentle injection was performed demonstrating subintimal location. We withdrew from the attempt at the anterior tibial artery revascularization and targeted the posterior tibial artery. The quick cross catheter and the command wire were used to select the origin of posterior tibial artery that was interpreted to be the origin. The command wire and the crossing catheter were passed into the mid segment when resistance was encountered. Wire was removed and a gentle injection was performed demonstrating a subintimal location. The catheter  wire were then withdrawn into the sheath, and we elected to perform access of the posterior tibial artery. Micro puncture was used to access the posterior tibial artery directed retrograde. A V18 wire was passed into the lumen of the posterior tibial artery, and was advanced easily through the posterior tibial artery into the popliteal artery. The needle was removed from the wire and then 018 crossing catheter was advanced on the wire through the posterior tibial artery, into the popliteal artery. A rendezvous was then performed with a angled Kumpe the catheter within the distal SFA, and the V18 wire was externalized. The Kumpe the catheter was removed. The 018 quick cross catheter was removed from the retrograde tibial access and  then passed antegrade through the length of the femoropopliteal system and the tibial artery. Once the catheter was within the distal posterior tibial artery, the V18 wire was removed. 014 whisper wire was then advanced antegrade through the crossing catheter used to select the lateral plantar artery. The whisper wire was advanced through the pedal loop into the distal dorsalis pedis. 2.5 mm balloon angioplasty was then performed along the length of the posterior tibial artery, through the common plantar artery, and into the lateral plantar artery. 2 mm balloon was then advanced, for angioplasty through the lateral plantar artery, the pedal loop, and into the distal dorsalis pedis. Upon inflation of the 2 mm balloon within the lateral plantar artery, a balloon rupture occurred with extravasation of contrast. 2.5 mm balloon was then inflated at low atmospheric pressure for 1 minute to achieve hemostasis. Balloon was then removed. Repeat angiogram was performed with CO2 and small volume contrast. Repeat 2 mm balloon angioplasty was then performed through the pedal loop. Final angiogram was performed through the lower extremity. All catheters and wires were removed. Six [place] Angio-Seal was  deployed for hemostasis. Patient tolerated the procedure well and remained hemodynamically stable throughout. No complications were encountered and no significant blood loss. FINDINGS: Initial ultrasound demonstrates patent common femoral artery. Angiogram demonstrates patent femoropopliteal system with no recurrent high-grade stenosis. 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 Continuous tibioperoneal trunk and peroneal artery through its entire length to the ankle. There is communication to the calcaneal arteries posteriorly and to tarsal arteries anteriorly. Occluded posterior tibial artery on the ischial angiogram. After treatment the posterior tibial artery, there is in-line flow through the posterior tibial artery, common plantar artery, and the medial and lateral calcaneal artery to the forefoot. 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 IMPRESSION: Status post ultrasound guided access right common femoral artery and the right posterior tibial artery for treatment of occluded posterior tibial artery with standard balloon angioplasty, restoring in-line flow into the forefoot through the pedal loop. Angio-Seal for hemostasis. Signed, Dulcy Fanny. Dellia Nims, RPVI [facility] Electronically Signed   By: Corrie Mckusick D.O.   On: 04/14/2021 16:31   IR US Guide Vasc Access Right  Result Date: 04/14/2021 INDICATION: 76 year old male with a history of right great toe wound, diabetic foot wound, worsening, presents for angiogram and possible therapy EXAM: ULTRASOUND-GUIDED ACCESS RIGHT COMMON FEMORAL ARTERY RIGHT LOWER EXTREMITY ANGIOGRAM BALLOON ANGIOPLASTY OF RIGHT POSTERIOR TIBIAL ARTERY ANGIO-SEAL FOR HEMOSTASIS MEDICATIONS: 9000 units IV heparin.  650 mg p.o. aspirin ANESTHESIA/SEDATION: Moderate (conscious) sedation was employed during this procedure. A total of Versed 3.0 mg and Fentanyl 100 mcg was  administered intravenously. Moderate Sedation Time: 116 minutes. The patient's level of consciousness and vital signs were monitored continuously by radiology nursing throughout the procedure under my direct supervision. CONTRAST:  27m OMNIPAQUE IOHEXOL 300 MG/ML  SOLN FLUOROSCOPY TIME:  Fluoroscopy Time: 19 minutes 54 seconds COMPLICATIONS: None PROCEDURE: Informed consent was obtained from the patient following explanation of the procedure, risks, benefits and alternatives. The patient understands, agrees and consents for the procedure. All questions were addressed. A time out was performed prior to the initiation of the procedure. Maximal barrier sterile technique utilized including caps, mask, sterile gowns, sterile gloves, large sterile drape, hand hygiene, and Betadine prep. Ultrasound survey of the right inguinal region was performed with images stored and sent to PACs, confirming patency of the right common femoral artery. 1% lidocaine was  used for local anesthesia. Stab incision was made with 11 blade scalpel. Blunt dissection was performed under ultrasound guidance. A micropuncture needle was used access the common femoral artery under ultrasound. With excellent arterial blood flow returned, and an .018 micro wire was passed through the needle, observed enter the SFA under fluoroscopy. The needle was removed, and a micropuncture sheath was placed over the wire. The inner dilator and wire were removed, and an 035 Bentson wire was advanced under fluoroscopy into the SFA. The sheath was removed and a standard 5 [place] vascular sheath was placed. The dilator was removed and the sheath was flushed. Angiogram of the right lower extremity was performed with CO2, including the femoropopliteal segment, with then a placement a Kumpe the catheter into the distal SFA for continuation of the tibial and pedal vessels. IV heparinization was then performed. Bentson wire was placed in the distal SFA and the 5 French  sheath was exchanged for a standard 6 French 45 cm straight tip sheath. Kumpe catheter was used to select the origin of the anterior tibial artery. 018 command wire was advanced into the anterior tibial artery through the first third. The Kumpe the catheter was removed and an 018 quick cross crossing catheter was passed on the command wire. Combination of the command wire and the crossing catheter were passed into the distal calf. Wire was removed and a gentle injection was performed demonstrating subintimal location. We withdrew from the attempt at the anterior tibial artery revascularization and targeted the posterior tibial artery. The quick cross catheter and the command wire were used to select the origin of posterior tibial artery that was interpreted to be the origin. The command wire and the crossing catheter were passed into the mid segment when resistance was encountered. Wire was removed and a gentle injection was performed demonstrating a subintimal location. The catheter wire were then withdrawn into the sheath, and we elected to perform access of the posterior tibial artery. Micro puncture was used to access the posterior tibial artery directed retrograde. A V18 wire was passed into the lumen of the posterior tibial artery, and was advanced easily through the posterior tibial artery into the popliteal artery. The needle was removed from the wire and then 018 crossing catheter was advanced on the wire through the posterior tibial artery, into the popliteal artery. A rendezvous was then performed with a angled Kumpe the catheter within the distal SFA, and the V18 wire was externalized. The Kumpe the catheter was removed. The 018 quick cross catheter was removed from the retrograde tibial access and then passed antegrade through the length of the femoropopliteal system and the tibial artery. Once the catheter was within the distal posterior tibial artery, the V18 wire was removed. 014 whisper wire was  then advanced antegrade through the crossing catheter used to select the lateral plantar artery. The whisper wire was advanced through the pedal loop into the distal dorsalis pedis. 2.5 mm balloon angioplasty was then performed along the length of the posterior tibial artery, through the common plantar artery, and into the lateral plantar artery. 2 mm balloon was then advanced, for angioplasty through the lateral plantar artery, the pedal loop, and into the distal dorsalis pedis. Upon inflation of the 2 mm balloon within the lateral plantar artery, a balloon rupture occurred with extravasation of contrast. 2.5 mm balloon was then inflated at low atmospheric pressure for 1 minute to achieve hemostasis. Balloon was then removed. Repeat angiogram was performed with CO2 and small volume  contrast. Repeat 2 mm balloon angioplasty was then performed through the pedal loop. Final angiogram was performed through the lower extremity. All catheters and wires were removed. Six [place] Angio-Seal was deployed for hemostasis. Patient tolerated the procedure well and remained hemodynamically stable throughout. No complications were encountered and no significant blood loss. FINDINGS: Initial ultrasound demonstrates patent common femoral artery. Angiogram demonstrates patent femoropopliteal system with no recurrent high-grade stenosis. 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 Continuous tibioperoneal trunk and peroneal artery through its entire length to the ankle. There is communication to the calcaneal arteries posteriorly and to tarsal arteries anteriorly. Occluded posterior tibial artery on the ischial angiogram. After treatment the posterior tibial artery, there is in-line flow through the posterior tibial artery, common plantar artery, and the medial and lateral calcaneal artery to the forefoot. 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 IMPRESSION: Status post ultrasound guided  access right common femoral artery and the right posterior tibial artery for treatment of occluded posterior tibial artery with standard balloon angioplasty, restoring in-line flow into the forefoot through the pedal loop. Angio-Seal for hemostasis. Signed, Dulcy Fanny. Dellia Nims, RPVI [facility] Electronically Signed   By: Corrie Mckusick D.O.   On: 04/14/2021 16:31   DG CHEST PORT 1 VIEW  Result Date: 04/19/2021 CLINICAL DATA:  Dyspnea EXAM: PORTABLE CHEST 1 VIEW COMPARISON:  04/18/2021 FINDINGS: The heart size and mediastinal contours are within normal limits. Both lungs are clear. The visualized skeletal structures are unremarkable. IMPRESSION: No active disease. Electronically Signed   By: Ulyses Jarred M.D.   On: 04/19/2021 02:30   DG Chest Port 1 View  Result Date: 04/18/2021 CLINICAL DATA:  Questionable sepsis - evaluate for abnormality Weakness, fever, shortness of breath, cough. EXAM: PORTABLE CHEST 1 VIEW COMPARISON:  Radiograph 11/27/2011 FINDINGS: Lung volumes are low. Mild cardiomegaly, likely accentuated by technique. Normal mediastinal contours with aortic atherosclerosis. Mild patchy opacity at the left lung base. No large pleural effusion. No pneumothorax. No pulmonary edema. No acute osseous abnormalities are seen. IMPRESSION: 1. Patchy opacity at the left lung base, may represent atelectasis or pneumonia. 2. Mild cardiomegaly, likely accentuated by technique. Electronically Signed   By: Keith Rake M.D.   On: 04/18/2021 17:17   DG Foot 2 Views Right  Result Date: 04/20/2021 CLINICAL DATA:  Postop right foot, amputation of the first digit. EXAM: RIGHT FOOT - 2 VIEW COMPARISON:  MRI examination dated April 18, 2021 FINDINGS: Postsurgical changes for recent first dated amputation through the metatarsophalangeal joint. Subcutaneous soft tissue edema and surgical clips in the surgical bed. Prior amputation of the second digit  through the metatarsophalangeal joint. Prominent plantar calcaneal spurring. Talonavicular and tibiotalar joint arthropathy with deformity of the talus and navicular bone. 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 Achilles enthesopathy. IMPRESSION: 1. Postsurgical changes of amputation through the first metatarsophalangeal joint. 2.  Additional chronic findings as detailed above. Electronically Signed   By: Keane Police D.O.   On: 04/20/2021 11:42   DG Foot Complete Left  Result Date: 04/18/2021 CLINICAL DATA:  Wound. EXAM: LEFT FOOT - COMPLETE 3+ VIEW COMPARISON:  No prior foot exams. FINDINGS: Prior resection of the first and second toes. There is a fracture of the third toe proximal phalanx of unknown acuity. Soft tissue defect subjacent to the metatarsal phalangeal joint joints, seen on the lateral view, tentatively visualized overlying the 1st-2nd intertarsal space. Subcortical cystic change about the distal metatarsal head but  no frank erosions. Chronic degenerative change of the midfoot. Large plantar calcaneal spur and Achilles tendon enthesophyte. IMPRESSION: 1. Soft tissue defect subjacent to the metatarsophalangeal joints, tentatively visualized overlying the 1st-2nd intertarsal space, likely representing site of wound. 2. Prior resection of the first and second toes. Subcortical cystic change of the first metatarsal head is of unknown acuity, favor chronic. No definite radiographic findings of osteomyelitis. 3. Fracture of the third toe proximal phalanx of unknown acuity. Electronically Signed   By: Keith Rake M.D.   On: 04/18/2021 17:24   DG Toe Great Right  Result Date: 04/18/2021 CLINICAL DATA:  Wound. EXAM: RIGHT GREAT TOE COMPARISON:  Foot radiograph 09/14/2019 FINDINGS: Decreased density the medial aspect of the great toe distal phalanx, with overlying thinning of the soft tissues, level pathologic fracture. There are small erosions about the medial aspect of the first metatarsal  phalangeal joint. Osteoarthritis of the first metatarsal phalangeal joint. Diffuse soft tissue edema about the proximal digit. Prior resection of the second toe. No radiopaque foreign body. IMPRESSION: 1. 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 2. Small erosions about the medial aspect first metatarsophalangeal joint. 3. Diffuse soft tissue edema about the proximal digit. Electronically Signed   By: Keith Rake M.D.   On: 04/18/2021 17:20   VAS [place] ABI WITH/WO TBI  Result Date: 04/19/2021  LOWER EXTREMITY DOPPLER STUDY Patient Name:  TRUMAINE WIMER  Date of Exam:   04/19/2021 Medical Rec #: [id number]        Accession #:    [id number] Date of Birth: 1945-11-22        Patient Gender: M Patient Age:   84 years Exam Location:  [facility] Procedure:      VAS [place] ABI WITH/WO TBI Referring Phys: Celesta Gentile --------------------------------------------------------------------------------  Indications: Peripheral artery disease. High Risk Factors: Hypertension, hyperlipidemia, Diabetes.  Vascular Interventions: Lt great toe amputation done 11/28/11                         Rt second toe amputation done 09/14/19. Limitations: Today's exam was limited due to right great toe wound. Performing Technologist: Archie Patten RVS  Examination Guidelines: A complete evaluation includes at minimum, Doppler waveform signals and systolic blood pressure reading at the level of bilateral brachial, anterior tibial, and posterior tibial arteries, when vessel segments are accessible. Bilateral testing is considered an integral part of a complete examination. Photoelectric Plethysmograph (PPG) waveforms and toe systolic pressure readings are included as required and additional duplex testing as needed. Limited examinations for reoccurring indications may be performed as noted.  ABI Findings: +---------+------------------+-----+----------+--------------------------------+   Right     Rt Pressure (mmHg) Index Waveform   Comment                           +---------+------------------+-----+----------+--------------------------------+  Brachial  129                      triphasic                                    +---------+------------------+-----+----------+--------------------------------+  PTA       144                1.12  monophasic                                   +---------+------------------+-----+----------+--------------------------------+  DP        255                1.98  monophasic                                   +---------+------------------+-----+----------+--------------------------------+  Great Toe                                     unable to obtain pressure due to                                                 toe wound                         +---------+------------------+-----+----------+--------------------------------+ +---------+------------------+-----+-------------------+--------------+  Left      Lt Pressure (mmHg) Index Waveform            Comment         +---------+------------------+-----+-------------------+--------------+  Brachial  124                      triphasic                           +---------+------------------+-----+-------------------+--------------+  PTA       83                 0.64  dampened monophasic                 +---------+------------------+-----+-------------------+--------------+  DP        255                1.98  monophasic                          +---------+------------------+-----+-------------------+--------------+  Great Toe                                              toe amputation  +---------+------------------+-----+-------------------+--------------+ +-------+-----------+-----------+------------+------------+  ABI/TBI Today's ABI Today's TBI Previous ABI Previous TBI  +-------+-----------+-----------+------------+------------+  Right   1.97                                                +-------+-----------+-----------+------------+------------+  Left    1.97                                               +-------+-----------+-----------+------------+------------+  Summary: Right: Resting right ankle-brachial index indicates noncompressible right lower extremity arteries. Left: Resting left ankle-brachial index indicates noncompressible left lower extremity arteries.  *See table(s) above for measurements and observations.  Electronically signed by Servando Snare MD on 04/19/2021 at 5:54:17 PM.    Final    ECHOCARDIOGRAM COMPLETE  Result Date: 04/20/2021  ECHOCARDIOGRAM REPORT   Patient Name:   Zachary [name] Date of Exam: 04/20/2021 Medical Rec #:  [id number]       Height:       68.0 in Accession #:    [id number]      Weight:       190.0 lb Date of Birth:  [birth year]       BSA:          2.000 m Patient Age:    8 years        BP:           115/75 mmHg Patient Gender: M               HR:           88 bpm. Exam Location:  Inpatient Procedure: 2D Echo, Cardiac Doppler, Color Doppler and Intracardiac            Opacification Agent Indications:    Bacteremia R78.81  History:        Patient has no prior history of Echocardiogram examinations.                 Post toe removal today.  Sonographer:    Merrie Roof RDCS Referring Phys: [id number] [place]  1. Left ventricular ejection fraction, by estimation, is 40 to 45%. The left ventricle has mildly decreased function. The left ventricle demonstrates regional wall motion abnormalities (see scoring diagram/findings for description). Left ventricular diastolic parameters are indeterminate.  2. Right ventricular systolic function is normal. The right ventricular size is normal. Tricuspid regurgitation signal is inadequate for assessing PA pressure.  3. Left atrial size was mildly dilated.  4. Right atrial size was mildly dilated.  5. The mitral valve is degenerative. No evidence of mitral valve regurgitation. Mild to moderate mitral stenosis.  The mean mitral valve gradient is 6.0 mmHg with average heart rate of 88 bpm. Moderate to severe mitral annular calcification.  6. The aortic valve is tricuspid. Aortic valve regurgitation is not visualized. Moderate to severe aortic valve stenosis. Aortic valve mean gradient measures 28.0 mmHg. Aortic valve Vmax measures 3.82 m/s. Conclusion(s)/Recommendation(s): No hemodynamically significant valvular heart disease. FINDINGS  Left Ventricle: Left ventricular ejection fraction, by estimation, is 40 to 45%. The left ventricle has mildly decreased function. The left ventricle demonstrates regional wall motion abnormalities. Definity contrast agent was given IV to delineate the left ventricular endocardial borders. The left ventricular internal cavity size was normal in size. There is no left ventricular hypertrophy. Left ventricular diastolic function could not be evaluated due to mitral stenosis. Left ventricular diastolic parameters are indeterminate. Right Ventricle: The right ventricular size is normal. No increase in right ventricular wall thickness. Right ventricular systolic function is normal. Tricuspid regurgitation signal is inadequate for assessing PA pressure. Left Atrium: Left atrial size was mildly dilated. Right Atrium: Right atrial size was mildly dilated. Pericardium: There is no evidence of pericardial effusion. Mitral Valve: The mitral valve is degenerative in appearance. Moderate to severe mitral annular calcification. No evidence of mitral valve regurgitation. Mild to moderate mitral valve stenosis. MV peak gradient, 19.0 mmHg. The mean mitral valve gradient is 6.0 mmHg with average heart rate of 88 bpm. Tricuspid Valve: The tricuspid valve is normal in structure. Tricuspid valve regurgitation is not demonstrated. Aortic Valve: The aortic valve is tricuspid. Aortic valve regurgitation is not visualized. Moderate to severe aortic stenosis is present. Aortic valve mean gradient measures 28.0 mmHg.  Aortic valve peak gradient measures  58.4 mmHg. Aortic valve area, by VTI measures 0.80 cm. Pulmonic Valve: The pulmonic valve was not well visualized. Pulmonic valve regurgitation is not visualized. Aorta: The aortic root is normal in size and structure. Venous: The inferior vena cava was not well visualized. IAS/Shunts: The interatrial septum was not well visualized.  LEFT VENTRICLE PLAX 2D LVIDd:         4.00 cm   Diastology LVIDs:         2.70 cm   LV e' medial:  5.22 cm/s LV PW:         1.00 cm   LV e' lateral: 7.18 cm/s LV IVS:        0.80 cm LVOT diam:     1.96 cm LV SV:         52 LV SV Index:   26 LVOT Area:     3.02 cm  RIGHT VENTRICLE RV Basal diam:  4.20 cm RV Mid diam:    4.70 cm LEFT ATRIUM              Index        RIGHT ATRIUM           Index LA diam:        4.10 cm  2.05 cm/m   RA Area:     21.00 cm LA Vol (A2C):   107.0 ml 53.51 ml/m  RA Volume:   59.50 ml  29.75 ml/m LA Vol (A4C):   71.9 ml  35.96 ml/m LA Biplane Vol: 95.6 ml  47.81 ml/m  AORTIC VALVE AV Area (Vmax):    0.79 cm AV Area (Vmean):   0.80 cm AV Area (VTI):     0.80 cm AV Vmax:           382.00 cm/s AV Vmean:          246.000 cm/s AV VTI:            0.656 m AV Peak Grad:      58.4 mmHg AV Mean Grad:      28.0 mmHg LVOT Vmax:         100.00 cm/s LVOT Vmean:        65.600 cm/s LVOT VTI:          0.174 m LVOT/AV VTI ratio: 0.27  AORTA Ao Root diam: 2.70 cm MITRAL VALVE MV Area (PHT): 1.29 cm    SHUNTS MV Peak grad:  19.0 mmHg   Systemic VTI:  0.17 m MV Mean grad:  6.0 mmHg    Systemic Diam: 1.96 cm MV Vmax:       2.18 m/s MV Vmean:      115.0 cm/s MV Decel Time: 587 msec Mihai Croitoru MD Electronically signed by Sanda Klein MD Signature Date/Time: 04/20/2021/7:31:14 PM    Final    [place] RT LOWER EXTREM LTD SOFT TISSUE NON VASCULAR  Result Date: 04/22/2021 CLINICAL DATA:  Pain and swelling right inguinal region EXAM: ULTRASOUND right inguinal LOWER EXTREMITY LIMITED TECHNIQUE: Ultrasound examination of the lower extremity  soft tissues was performed in the area of clinical concern. COMPARISON:  None. FINDINGS: There are no abnormal fluid collections in the right inguinal region. 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 IMPRESSION: There are no abnormal fluid collections in the right inguinal region. There is lymph node with fatty hilum suggesting possible benign reactive hyperplasia. Electronically Signed   By: Prudy Feeler.D.  On: 04/22/2021 11:22   ECHO TEE  Result Date: 04/26/2021    TRANSESOPHOGEAL ECHO REPORT   Patient Name:   Zachary [name] Date of Exam: 04/26/2021 Medical Rec #:  [id number]       Height:       68.0 in Accession #:    [id number]      Weight:       185.6 lb Date of Birth:  1945/12/01       BSA:          1.980 m Patient Age:    27 years        BP:           116/52 mmHg Patient Gender: M               HR:           74 bpm. Exam Location:  Inpatient Procedure: Cardiac Doppler, Color Doppler, Transesophageal Echo, 3D Echo and            Saline Contrast Bubble Study Indications:    Bacteremia  History:        Patient has prior history of Echocardiogram examinations, most                 recent 04/20/2021. Aortic Valve Disease and Mitral Valve Disease.  Sonographer:    Clayton Lefort RDCS (AE) Referring Phys: [id number] Dennison: After discussion of the risks and benefits of a TEE, an informed consent was obtained from the patient. TEE procedure time was 16 minutes. The transesophogeal probe was passed without difficulty through the esophogus of the patient. Imaged were obtained with the patient in a left lateral decubitus position. Sedation performed by different physician. The patient was monitored while under deep sedation. Anesthestetic sedation was provided intravenously by Anesthesiology: 139m of Propofol. Image quality was excellent. The patient's vital signs; including heart rate, blood pressure, and  oxygen saturation; remained stable throughout the procedure. The patient developed no complications during the procedure. IMPRESSIONS  1. Left ventricular ejection fraction, by estimation, is 60 to 65%. The left ventricle has normal function.  2. Right ventricular systolic function is normal. The right ventricular size is normal.  3. Left atrial size was mildly dilated. No left atrial/left atrial appendage thrombus was detected. The LAA emptying velocity was 50 cm/s.  4. Right atrial size was mildly dilated.  5. Moderate calcific mitral stenosis is present. MG 7 mmHG @ 80 bpm. MVA by VTI 1.28 cm2, DI 0.45. MVA by direct 3D MPR 1.85 cm2. The mitral valve is degenerative. Mild mitral valve regurgitation. Moderate mitral stenosis. Moderate to severe mitral annular calcification.  6. At least moderate AS is present. Gradients lower than TTE, likley due to anesthesia. The aortic valve is tricuspid. Aortic valve regurgitation is mild. Moderate aortic valve stenosis. Aortic valve area, by VTI measures 1.24 cm. Aortic valve mean gradient measures 14.0 mmHg. Aortic valve Vmax measures 2.47 m/s.  7. 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  8. Agitated saline contrast bubble study was negative, with no evidence of any interatrial shunt. Conclusion(s)/Recommendation(s): No evidence of vegetation/infective endocarditis on this transesophageael echocardiogram. FINDINGS  Left Ventricle: Left ventricular ejection fraction, by estimation, is 60 to 65%. The left ventricle has normal function. The left ventricular internal cavity size was normal in size. Right Ventricle: The right ventricular size is normal. No increase in right ventricular wall thickness. Right ventricular systolic function is normal. Left Atrium: Left atrial size  was mildly dilated. No left atrial/left atrial appendage thrombus was detected. The LAA emptying velocity was 50 cm/s. Right Atrium: Right atrial size was mildly  dilated. Pericardium: There is no evidence of pericardial effusion. Mitral Valve: Moderate calcific mitral stenosis is present. MG 7 mmHG @ 80 bpm. MVA by VTI 1.28 cm2, DI 0.45. MVA by direct 3D MPR 1.85 cm2. The mitral valve is degenerative in appearance. Moderate to severe mitral annular calcification. Mild mitral valve regurgitation. Moderate mitral valve stenosis. MV peak gradient, 17.0 mmHg. The mean mitral valve gradient is 7.0 mmHg with average heart rate of 80 bpm. There is no evidence of mitral valve vegetation. Tricuspid Valve: The tricuspid valve is normal in structure. Tricuspid valve regurgitation is trivial. No evidence of tricuspid stenosis. There is no evidence of tricuspid valve vegetation. Aortic Valve: At least moderate AS is present. Gradients lower than TTE, likley due to anesthesia. The aortic valve is tricuspid. Aortic valve regurgitation is mild. Aortic regurgitation PHT measures 600 msec. Moderate aortic stenosis is present. Aortic valve mean gradient measures 14.0 mmHg. Aortic valve peak gradient measures 24.4 mmHg. Aortic valve area, by VTI measures 1.24 cm. There is no evidence of aortic valve vegetation. Pulmonic Valve: The pulmonic valve was normal in structure. Pulmonic valve regurgitation is trivial. No evidence of pulmonic stenosis. There is no evidence of pulmonic valve vegetation. Aorta: The aortic root and ascending aorta are structurally normal, with no evidence of dilitation. There is moderate (Grade III) layered plaque involving the descending aorta. Venous: The left lower pulmonary vein, left upper pulmonary vein, right upper pulmonary vein and right lower pulmonary vein are normal. IAS/Shunts: The atrial septum is grossly normal. Agitated saline contrast was given intravenously to evaluate for intracardiac shunting. Agitated saline contrast bubble study was negative, with no evidence of any interatrial shunt.  LEFT VENTRICLE PLAX 2D LVOT diam:     1.90 cm LV SV:         57  LV SV Index:   29 LVOT Area:     2.84 cm  AORTIC VALVE AV Area (Vmax):    1.10 cm AV Area (Vmean):   1.13 cm AV Area (VTI):     1.24 cm AV Vmax:           247.00 cm/s AV Vmean:          173.000 cm/s AV VTI:            0.460 m AV Peak Grad:      24.4 mmHg AV Mean Grad:      14.0 mmHg LVOT Vmax:         96.20 cm/s LVOT Vmean:        69.200 cm/s LVOT VTI:          0.201 m LVOT/AV VTI ratio: 0.44 AI PHT:            600 msec  AORTA Ao Root diam: 2.78 cm MITRAL VALVE MV Area VTI:  1.28 cm    SHUNTS MV Peak grad: 17.0 mmHg   Systemic VTI:  0.20 m MV Mean grad: 7.0 mmHg    Systemic Diam: 1.90 cm MV Vmax:      2.06 m/s MV Vmean:     121.0 cm/s MV VTI:       0.44 m Eleonore Chiquito MD Electronically signed by Eleonore Chiquito MD Signature Date/Time: 04/26/2021/2:54:05 PM    Final    [place] EKG SITE RITE  Result Date: 04/22/2021 If Site Rite image not attached, placement  could not be confirmed due to current cardiac rhythm.  IR Radiologist Eval & Mgmt  Result Date: 04/09/2021 Please refer to notes tab for details about interventional procedure. (Op Note)    The results of significant diagnostics from this hospitalization (including imaging, microbiology, ancillary and laboratory) are listed below for reference.     Microbiology: Recent Results (from the past 240 hour(s))  Culture, blood (routine x 2)     Status: None   Collection Time: 04/19/21  8:41 PM   Specimen: BLOOD RIGHT HAND  Result Value Ref Range Status   Specimen Description BLOOD RIGHT HAND  Final   Special Requests   Final    BOTTLES DRAWN AEROBIC AND ANAEROBIC Blood Culture adequate volume   Culture   Final    NO GROWTH 5 DAYS Performed at [facility], [address] [place], [place] [address]    Report Status 04/24/2021 FINAL  Final  Culture, blood (routine x 2)     Status: None   Collection Time: 04/19/21  8:41 PM   Specimen: BLOOD  Result Value Ref Range Status   Specimen Description BLOOD LEFT ARM  Final   Special Requests    Final    BOTTLES DRAWN AEROBIC AND ANAEROBIC Blood Culture adequate volume   Culture   Final    NO GROWTH 5 DAYS Performed at [facility] [address], [place], [place] [address]    Report Status 04/24/2021 FINAL  Final  Surgical pcr screen     Status: None   Collection Time: 04/20/21  5:42 AM   Specimen: Nasal Mucosa; Nasal Swab  Result Value Ref Range Status   MRSA, PCR NEGATIVE NEGATIVE Final   Staphylococcus aureus NEGATIVE NEGATIVE Final    Comment: (NOTE) The Xpert SA Assay (FDA approved for NASAL specimens in patients 38 years of age and older), is one component of a comprehensive surveillance program. It is not intended to diagnose infection nor to guide or monitor treatment. Performed at [facility], [place] [address], [place], [place] [address]   Culture, fungus without smear     Status: None (Preliminary result)   Collection Time: 04/20/21 10:44 AM   Specimen: Toe, Right; Amputation  Result Value Ref Range Status   Specimen Description TOE  Final   Special Requests RIGHT TOE  Final   Culture   Final    NO FUNGUS ISOLATED AFTER 8 DAYS Performed at [facility], [address] [place], [place] [address]    Report Status PENDING  Incomplete  Aerobic/Anaerobic Culture w Gram Stain (surgical/deep wound)     Status: None   Collection Time: 04/20/21 10:44 AM   Specimen: Toe, Right; Amputation  Result Value Ref Range Status   Specimen Description TOE  Final   Special Requests RIGHT TOE  Final   Gram Stain   Final    RARE WBC PRESENT, PREDOMINANTLY MONONUCLEAR MODERATE GRAM POSITIVE COCCI RARE GRAM NEGATIVE RODS    Culture   Final    FEW STAPHYLOCOCCUS AUREUS MODERATE ESCHERICHIA COLI MODERATE KLEBSIELLA OXYTOCA NO ANAEROBES ISOLATED Performed at [facility], [place] [address], [place], [place] [address]    Report Status 04/25/2021 FINAL  Final   Organism ID, Bacteria ESCHERICHIA COLI  Final   Organism ID, Bacteria KLEBSIELLA OXYTOCA   Final   Organism ID, Bacteria STAPHYLOCOCCUS AUREUS  Final      Susceptibility   Escherichia coli - MIC*    AMPICILLIN >=32 RESISTANT  Resistant     CEFAZOLIN >=64 RESISTANT Resistant     CEFEPIME <=0.12 SENSITIVE Sensitive     CEFTAZIDIME <=1 SENSITIVE Sensitive     CEFTRIAXONE 0.5 SENSITIVE Sensitive     CIPROFLOXACIN <=0.25 SENSITIVE Sensitive     GENTAMICIN <=1 SENSITIVE Sensitive     IMIPENEM <=0.25 SENSITIVE Sensitive     TRIMETH/SULFA <=20 SENSITIVE Sensitive     AMPICILLIN/SULBACTAM >=32 RESISTANT Resistant     PIP/TAZO 16 SENSITIVE Sensitive     * MODERATE ESCHERICHIA COLI   Klebsiella oxytoca - MIC*    AMPICILLIN >=32 RESISTANT Resistant     CEFAZOLIN 8 SENSITIVE Sensitive     CEFEPIME <=0.12 SENSITIVE Sensitive     CEFTAZIDIME <=1 SENSITIVE Sensitive     CEFTRIAXONE <=0.25 SENSITIVE Sensitive     CIPROFLOXACIN <=0.25 SENSITIVE Sensitive     GENTAMICIN <=1 SENSITIVE Sensitive     IMIPENEM <=0.25 SENSITIVE Sensitive     TRIMETH/SULFA <=20 SENSITIVE Sensitive     AMPICILLIN/SULBACTAM 8 SENSITIVE Sensitive     PIP/TAZO <=4 SENSITIVE Sensitive     * MODERATE KLEBSIELLA OXYTOCA   Staphylococcus aureus - MIC*    CIPROFLOXACIN <=0.5 SENSITIVE Sensitive     ERYTHROMYCIN <=0.25 SENSITIVE Sensitive     GENTAMICIN <=0.5 SENSITIVE Sensitive     OXACILLIN <=0.25 SENSITIVE Sensitive     TETRACYCLINE <=1 SENSITIVE Sensitive     VANCOMYCIN <=0.5 SENSITIVE Sensitive     TRIMETH/SULFA <=10 SENSITIVE Sensitive     CLINDAMYCIN <=0.25 SENSITIVE Sensitive     RIFAMPIN <=0.5 SENSITIVE Sensitive     Inducible Clindamycin NEGATIVE Sensitive     * FEW STAPHYLOCOCCUS AUREUS  Resp Panel by RT-PCR (Flu A&B, Covid) Nasopharyngeal Swab     Status: None   Collection Time: 04/28/21  4:03 PM   Specimen: Nasopharyngeal Swab; Nasopharyngeal(NP) swabs in vial transport medium  Result Value Ref Range Status   SARS Coronavirus 2 by RT PCR NEGATIVE NEGATIVE Final    Comment: (NOTE) SARS-CoV-2 target  nucleic acids are NOT DETECTED.  The SARS-CoV-2 RNA is generally detectable in upper respiratory specimens during the acute phase of infection. The lowest concentration of SARS-CoV-2 viral copies this assay can detect is 138 copies/mL. A negative result does not preclude SARS-Cov-2 infection and should not be used as the sole basis for treatment or other patient management decisions. A negative result may occur with  improper specimen collection/handling, submission of specimen other than nasopharyngeal swab, presence of viral mutation(s) within the areas targeted by this assay, and inadequate number of viral copies(<138 copies/mL). A negative result must be combined with clinical observations, patient history, and epidemiological information. The expected result is Negative.  Fact Sheet for Patients:  EntrepreneurPulse.com.au  Fact Sheet for Healthcare Providers:  IncredibleEmployment.be  This test is no t yet approved or cleared by the [place] FDA and  has been authorized for detection and/or diagnosis of SARS-CoV-2 by FDA under an Emergency Use Authorization (EUA). This EUA will remain  in effect (meaning this test can be used) for the duration of the COVID-19 declaration under Section 564(b)(1) of the Act, 21 U.S.C.section 360bbb-3(b)(1), unless the authorization is terminated  or revoked sooner.       Influenza A by PCR NEGATIVE NEGATIVE Final   Influenza B by PCR NEGATIVE NEGATIVE Final    Comment: (NOTE) The Xpert Xpress SARS-CoV-2/FLU/RSV plus assay is intended as an aid in the diagnosis of influenza from Nasopharyngeal swab specimens and should not be used as a sole basis  for treatment. Nasal washings and aspirates are unacceptable for Xpert Xpress SARS-CoV-2/FLU/RSV testing.  Fact Sheet for Patients: EntrepreneurPulse.com.au  Fact Sheet for Healthcare  Providers: IncredibleEmployment.be  This test is not yet approved or cleared by the [place] FDA and has been authorized for detection and/or diagnosis of SARS-CoV-2 by FDA under an Emergency Use Authorization (EUA). This EUA will remain in effect (meaning this test can be used) for the duration of the COVID-19 declaration under Section 564(b)(1) of the Act, 21 U.S.C. section 360bbb-3(b)(1), unless the authorization is terminated or revoked.  Performed at [facility], [place] [address], [place], [place] [address]      Labs: BNP (last 3 results) No results for input(s): BNP in the last 8760 hours. Basic Metabolic Panel: Recent Labs  Lab 04/22/21 1236 04/26/21 0422 04/28/21 0408  NA 132* 136 136  K 3.6 4.2 3.9  CL 97* 99 98  CO2 _0 GLUCOSE 235* 153* 109*  BUN 24* 16 16  CREATININE 1.41* 1.38* 1.40*  CALCIUM 7.8* 8.3* 8.4*   Liver Function Tests: Recent Labs  Lab 04/22/21 1236  AST 19  ALT 9  ALKPHOS 67  BILITOT 0.3  PROT 5.3*  ALBUMIN 1.9*   No results for input(s): LIPASE, AMYLASE in the last 168 hours. Recent Labs  Lab 04/26/21 1003  AMMONIA <10   CBC: Recent Labs  Lab 04/26/21 0422  WBC 9.1  HGB 13.4  HCT 40.2  MCV 87.6  PLT 334   Cardiac Enzymes: No results for input(s): CKTOTAL, CKMB, CKMBINDEX, TROPONINI in the last 168 hours. BNP: Invalid input(s): POCBNP CBG: Recent Labs  Lab 04/28/21 0643 04/28/21 1117 04/28/21 1629 04/28/21 2123 04/29/21 0628  GLUCAP 132* 196* 162* 100* 106*   D-Dimer No results for input(s): DDIMER in the last 72 hours. Hgb A1c No results for input(s): HGBA1C in the last 72 hours. Lipid Profile No results for input(s): CHOL, HDL, LDLCALC, TRIG, CHOLHDL, LDLDIRECT in the last 72 hours. Thyroid function studies No results for input(s): TSH, T4TOTAL, T3FREE, THYROIDAB in the last 72 hours.  Invalid input(s): FREET3 Anemia work up No results for input(s): VITAMINB12, FOLATE,  FERRITIN, TIBC, IRON, RETICCTPCT in the last 72 hours. Urinalysis    Component Value Date/Time   COLORURINE YELLOW 04/18/2021 1635   APPEARANCEUR CLOUDY (A) 04/18/2021 1635   LABSPEC 1.025 04/18/2021 1635   PHURINE 5.5 04/18/2021 1635   GLUCOSEU >=500 (A) 04/18/2021 1635   HGBUR LARGE (A) 04/18/2021 1635   BILIRUBINUR NEGATIVE 04/18/2021 1635   KETONESUR NEGATIVE 04/18/2021 1635   PROTEINUR 100 (A) 04/18/2021 1635   NITRITE NEGATIVE 04/18/2021 1635   LEUKOCYTESUR SMALL (A) 04/18/2021 1635   Sepsis Labs Invalid input(s): PROCALCITONIN,  WBC,  LACTICIDVEN Microbiology Recent Results (from the past 240 hour(s))  Culture, blood (routine x 2)     Status: None   Collection Time: 04/19/21  8:41 PM   Specimen: BLOOD RIGHT HAND  Result Value Ref Range Status   Specimen Description BLOOD RIGHT HAND  Final   Special Requests   Final    BOTTLES DRAWN AEROBIC AND ANAEROBIC Blood Culture adequate volume   Culture   Final    NO GROWTH 5 DAYS Performed at [facility], [place] [address] [place], [place] [address]    Report Status 04/24/2021 FINAL  Final  Culture, blood (routine x 2)     Status: None   Collection Time: 04/19/21  8:41 PM   Specimen: BLOOD  Result Value  Ref Range Status   Specimen Description BLOOD LEFT ARM  Final   Special Requests   Final    BOTTLES DRAWN AEROBIC AND ANAEROBIC Blood Culture adequate volume   Culture   Final    NO GROWTH 5 DAYS Performed at [facility], [address] [place], [place] [address]    Report Status 04/24/2021 FINAL  Final  Surgical pcr screen     Status: None   Collection Time: 04/20/21  5:42 AM   Specimen: Nasal Mucosa; Nasal Swab  Result Value Ref Range Status   MRSA, PCR NEGATIVE NEGATIVE Final   Staphylococcus aureus NEGATIVE NEGATIVE Final    Comment: (NOTE) The Xpert SA Assay (FDA approved for NASAL specimens in patients 48 years of age and older), is one component of a comprehensive surveillance program. It is not  intended to diagnose infection nor to guide or monitor treatment. Performed at [facility], [place] [address] [place], [place] [address]   Culture, fungus without smear     Status: None (Preliminary result)   Collection Time: 04/20/21 10:44 AM   Specimen: Toe, Right; Amputation  Result Value Ref Range Status   Specimen Description TOE  Final   Special Requests RIGHT TOE  Final   Culture   Final    NO FUNGUS ISOLATED AFTER 8 DAYS Performed at [facility], [address], [place], [place] [address]    Report Status PENDING  Incomplete  Aerobic/Anaerobic Culture w Gram Stain (surgical/deep wound)     Status: None   Collection Time: 04/20/21 10:44 AM   Specimen: Toe, Right; Amputation  Result Value Ref Range Status   Specimen Description TOE  Final   Special Requests RIGHT TOE  Final   Gram Stain   Final    RARE WBC PRESENT, PREDOMINANTLY MONONUCLEAR MODERATE GRAM POSITIVE COCCI RARE GRAM NEGATIVE RODS    Culture   Final    FEW STAPHYLOCOCCUS AUREUS MODERATE ESCHERICHIA COLI MODERATE KLEBSIELLA OXYTOCA NO ANAEROBES ISOLATED Performed at [facility], [place] [address], [place], [place] [address]    Report Status 04/25/2021 FINAL  Final   Organism ID, Bacteria ESCHERICHIA COLI  Final   Organism ID, Bacteria KLEBSIELLA OXYTOCA  Final   Organism ID, Bacteria STAPHYLOCOCCUS AUREUS  Final      Susceptibility   Escherichia coli - MIC*    AMPICILLIN >=32 RESISTANT Resistant     CEFAZOLIN >=64 RESISTANT Resistant     CEFEPIME <=0.12 SENSITIVE Sensitive     CEFTAZIDIME <=1 SENSITIVE Sensitive     CEFTRIAXONE 0.5 SENSITIVE Sensitive     CIPROFLOXACIN <=0.25 SENSITIVE Sensitive     GENTAMICIN <=1 SENSITIVE Sensitive     IMIPENEM <=0.25 SENSITIVE Sensitive     TRIMETH/SULFA <=20 SENSITIVE Sensitive     AMPICILLIN/SULBACTAM >=32 RESISTANT Resistant     PIP/TAZO 16 SENSITIVE Sensitive     * MODERATE ESCHERICHIA COLI   Klebsiella oxytoca - MIC*    AMPICILLIN >=32  RESISTANT Resistant     CEFAZOLIN 8 SENSITIVE Sensitive     CEFEPIME <=0.12 SENSITIVE Sensitive     CEFTAZIDIME <=1 SENSITIVE Sensitive     CEFTRIAXONE <=0.25 SENSITIVE Sensitive     CIPROFLOXACIN <=0.25 SENSITIVE Sensitive     GENTAMICIN <=1 SENSITIVE Sensitive     IMIPENEM <=0.25 SENSITIVE Sensitive     TRIMETH/SULFA <=20 SENSITIVE Sensitive     AMPICILLIN/SULBACTAM 8 SENSITIVE Sensitive     PIP/TAZO <=4 SENSITIVE Sensitive     *  MODERATE KLEBSIELLA OXYTOCA   Staphylococcus aureus - MIC*    CIPROFLOXACIN <=0.5 SENSITIVE Sensitive     ERYTHROMYCIN <=0.25 SENSITIVE Sensitive     GENTAMICIN <=0.5 SENSITIVE Sensitive     OXACILLIN <=0.25 SENSITIVE Sensitive     TETRACYCLINE <=1 SENSITIVE Sensitive     VANCOMYCIN <=0.5 SENSITIVE Sensitive     TRIMETH/SULFA <=10 SENSITIVE Sensitive     CLINDAMYCIN <=0.25 SENSITIVE Sensitive     RIFAMPIN <=0.5 SENSITIVE Sensitive     Inducible Clindamycin NEGATIVE Sensitive     * FEW STAPHYLOCOCCUS AUREUS  Resp Panel by RT-PCR (Flu A&B, Covid) Nasopharyngeal Swab     Status: None   Collection Time: 04/28/21  4:03 PM   Specimen: Nasopharyngeal Swab; Nasopharyngeal(NP) swabs in vial transport medium  Result Value Ref Range Status   SARS Coronavirus 2 by RT PCR NEGATIVE NEGATIVE Final    Comment: (NOTE) SARS-CoV-2 target nucleic acids are NOT DETECTED.  The SARS-CoV-2 RNA is generally detectable in upper respiratory specimens during the acute phase of infection. The lowest concentration of SARS-CoV-2 viral copies this assay can detect is 138 copies/mL. A negative result does not preclude SARS-Cov-2 infection and should not be used as the sole basis for treatment or other patient management decisions. A negative result may occur with  improper specimen collection/handling, submission of specimen other than nasopharyngeal swab, presence of viral mutation(s) within the areas targeted by this assay, and inadequate number of viral copies(<138 copies/mL).  A negative result must be combined with clinical observations, patient history, and epidemiological information. The expected result is Negative.  Fact Sheet for Patients:  EntrepreneurPulse.com.au  Fact Sheet for Healthcare Providers:  IncredibleEmployment.be  This test is no t yet approved or cleared by the [place] FDA and  has been authorized for detection and/or diagnosis of SARS-CoV-2 by FDA under an Emergency Use Authorization (EUA). This EUA will remain  in effect (meaning this test can be used) for the duration of the COVID-19 declaration under Section 564(b)(1) of the Act, 21 U.S.C.section 360bbb-3(b)(1), unless the authorization is terminated  or revoked sooner.       Influenza A by PCR NEGATIVE NEGATIVE Final   Influenza B by PCR NEGATIVE NEGATIVE Final    Comment: (NOTE) The Xpert Xpress SARS-CoV-2/FLU/RSV plus assay is intended as an aid in the diagnosis of influenza from Nasopharyngeal swab specimens and should not be used as a sole basis for treatment. Nasal washings and aspirates are unacceptable for Xpert Xpress SARS-CoV-2/FLU/RSV testing.  Fact Sheet for Patients: EntrepreneurPulse.com.au  Fact Sheet for Healthcare Providers: IncredibleEmployment.be  This test is not yet approved or cleared by the [place] FDA and has been authorized for detection and/or diagnosis of SARS-CoV-2 by FDA under an Emergency Use Authorization (EUA). This EUA will remain in effect (meaning this test can be used) for the duration of the COVID-19 declaration under Section 564(b)(1) of the Act, 21 U.S.C. section 360bbb-3(b)(1), unless the authorization is terminated or revoked.  Performed at [facility], [place] [address], [place], [place] [address]      Time coordinating discharge in minutes: 65  SIGNED:   Debbe Odea, MD  [facility] 04/29/2021, 7:25 AM

## 2021-04-28 NOTE — Progress Notes (Addendum)
PROGRESS NOTE    [name]   YPP:[id number]  DOB: August 05, 1945  DOA: 04/18/2021 PCP: [name], FNP   Brief Narrative:  Zachary Marcott Johnsonis a 76 year old male with a history of type 2 diabetes mellitus, HTN, HLD, PVD, BPH presented with bilateral leg pain.He had  aorto-peripheral angiogram on 04/14/21 by IR. After the procedure he reportedly started to have pain in his legs. No bleeding or drainage from procedure site. He also has had right lower back pain for the past few days as well.  Work-up revealed osteomyelitis of both proximal and distal phalanx of the great toe seen on MRI 04/18/2021.   Underwent right great toe amputation on 1/10, podiatry following   Hospital course complicated by MSSA bacteremia, E. coli UTI.  ID consulted Surgical cultures with E. 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  Neurosurgery consulted MRI left foot: Osteomyelitis of the left fourth distal phalanx, podiatry reconsulted 1/16: TEE completed: No endocarditis.  Patient noted to have right hand jerking movements.  Neurology consulted.  Cefepime discontinued, started on IV cefazolin and ciprofloxacin 1/17: Right hand jerking movements improving, complaining of severe constipation     Subjective: He has no complaints for me today.  He tells me he is not sure if he has had any bowel movements.    Assessment & Plan:   Principal Problem:   MSSA bacteremia-sepsis present on admission - Appreciate ID consult - No vegetations noted on 2D echo and on TEE - Patient was also receiving IV cefepime and this was discontinued on 1/16 due to jerking of the right hand-neurology was consulted at this time and did recommend to discontinue cefepime -Currently receiving cefazolin and oral ciprofloxacin per ID recommendations with plan to stop on 06/14/21 and the following labs weekly while on IV Abx - CBC with differential __ CMP __ CRP __ ESR -PT is recommending  SNF  Active Problems:   Epidural abscess -No surgical intervention at this time - Neurosurgery evaluated the patient on 1/13> Recommended continuing IV antibiotics per ID- Also recommended a repeat MRI in 2 to 3 weeks   Right great toe osteomyelitis - 1/10-underwent amputation and application of a skin graft by podiatry -Note MRI of the left foot showed osteomyelitis of the fourth distal phalanx and the patient was evaluated by Dr. Cannon Kettle on 1/13 and again on 1/14-after discussion with Dr. [name], Dr. Cannon Kettle felt to continue antibiotics and local wound care for the left fourth toe-may reconsider options in the future if the foot toe fails to improve after he is seen by Dr. [name] - Surgical cultures positive for E. coli, Klebsiella and few staff aureus - Antibiotics per ID  Peripheral vascular disease - Angiogram on right leg was performed on 04/14/2021 by IR -findings>AT is occluded in the first third PT was occluded proximally on initial, In-line flow into the pedal arteries/forefoot on conclusion.  -Continue aspirin and Plavix   Essential hypertension  -Continue diltiazem   E. coli UTI - Pyuria noted on admission - Urine culture grew out greater than 100,000 colonies of E. coli - Has been treated  Diabetes mellitus type 2 with unspecified complications - W5Y noted to be 8.5 on 1/10 - Uses Soliqua as outpt - continue Semglee, moderate sliding scale insulin and NovoLog 3 units 3 times daily     Stage 3 chronic kidney disease B -Stable     Constipation -He is unable to remember if he was constipated - Placed  on MiraLAX twice a day, senna 2 times a day and lactulose 2 times a day as needed - Follow stool output  Moderate to severe calcified mitral stenosis - Noted on TEE on 1/16      DVT prophylaxis: enoxaparin (LOVENOX) injection 40 mg Start: 04/25/21 0930 Place TED hose Start: 04/18/21 1946   Code Status: DO NOT RESUSCITATE Level of Care: Level of care: Telemetry  Medical Disposition Plan:  Status is: Inpatient  Remains inpatient appropriate because: On IV antibiotics    Consultants:  Neurology Neurosurgery ID Procedures:  2D echo TEE Antimicrobials:  Anti-infectives (From admission, onward)    Start     Dose/Rate Route Frequency Ordered Stop   04/26/21 1400  ceFAZolin (ANCEF) IVPB 2g/100 mL premix        2 g 200 mL/hr over 30 Minutes Intravenous Every 8 hours 04/26/21 0902     04/26/21 1000  ciprofloxacin (CIPRO) tablet 500 mg        500 mg Oral 2 times daily 04/26/21 0902     04/22/21 2300  ceFEPIme (MAXIPIME) 2 g in sodium chloride 0.9 % 100 mL IVPB  Status:  Discontinued        2 g 200 mL/hr over 30 Minutes Intravenous Every 12 hours 04/22/21 1612 04/26/21 0902   04/22/21 2000  ciprofloxacin (CIPRO) tablet 500 mg  Status:  Discontinued        500 mg Oral 2 times daily 04/22/21 1514 04/22/21 1612   04/20/21 1059  vancomycin (VANCOCIN) powder  Status:  Discontinued          As needed 04/20/21 1100 04/20/21 1111   04/20/21 0600  ceFAZolin (ANCEF) IVPB 2g/100 mL premix  Status:  Discontinued        2 g 200 mL/hr over 30 Minutes Intravenous On call to O.R. 04/20/21 0332 04/20/21 0340   04/19/21 2200  ceFAZolin (ANCEF) IVPB 2g/100 mL premix  Status:  Discontinued        2 g 200 mL/hr over 30 Minutes Intravenous Every 8 hours 04/19/21 1051 04/22/21 1612   04/19/21 1000  ceFEPIme (MAXIPIME) 2 g in sodium chloride 0.9 % 100 mL IVPB  Status:  Discontinued        2 g 200 mL/hr over 30 Minutes Intravenous Every 12 hours 04/18/21 1813 04/18/21 1836   04/19/21 1000  vancomycin (VANCOREADY) IVPB 750 mg/150 mL  Status:  Discontinued        750 mg 150 mL/hr over 60 Minutes Intravenous Every 24 hours 04/18/21 1813 04/19/21 1051   04/19/21 0600  cefTRIAXone (ROCEPHIN) 2 g in sodium chloride 0.9 % 100 mL IVPB  Status:  Discontinued       Note to Pharmacy: Start tomorrow? Since had cefepime?   2 g 200 mL/hr over 30 Minutes Intravenous Every 24  hours 04/18/21 1836 04/19/21 1051   04/18/21 1930  azithromycin (ZITHROMAX) 500 mg in sodium chloride 0.9 % 250 mL IVPB  Status:  Discontinued        500 mg 250 mL/hr over 60 Minutes Intravenous Every 24 hours 04/18/21 1915 04/19/21 1051   04/18/21 1800  vancomycin (VANCOREADY) IVPB 1750 mg/350 mL        1,750 mg 175 mL/hr over 120 Minutes Intravenous  Once 04/18/21 1755 04/18/21 2125   04/18/21 1630  ceFEPIme (MAXIPIME) 2 g in sodium chloride 0.9 % 100 mL IVPB        2 g 200 mL/hr over 30 Minutes Intravenous  Once 04/18/21 1624 04/18/21 1852  04/18/21 1630  metroNIDAZOLE (FLAGYL) IVPB 500 mg        500 mg 100 mL/hr over 60 Minutes Intravenous  Once 04/18/21 1624 04/18/21 1810   04/18/21 1630  vancomycin (VANCOCIN) IVPB 1000 mg/200 mL premix  Status:  Discontinued        1,000 mg 200 mL/hr over 60 Minutes Intravenous  Once 04/18/21 1624 04/18/21 1755        Objective: Vitals:   04/27/21 1623 04/27/21 2332 04/28/21 0504 04/28/21 0835  BP: 112/68 124/63 (!) 142/66 (!) 123/56  Pulse: 80 79 77 86  Resp: $Remo'17 18 16 17  'jvcIx$ Temp: (!) 97.3 F (36.3 C) 97.8 F (36.6 C) 97.7 F (36.5 C) 98.3 F (36.8 C)  TempSrc: Oral Oral Oral   SpO2: 94% 98% 97% 93%  Weight:      Height:        Intake/Output Summary (Last 24 hours) at 04/28/2021 1151 Last data filed at 04/28/2021 1103 Gross per 24 hour  Intake 840 ml  Output 2600 ml  Net -1760 ml   Filed Weights   04/22/21 0539 04/23/21 0536 04/24/21 5859  Weight: 88 kg 84.9 kg 84.2 kg    Examination: General exam: Appears comfortable  HEENT: PERRLA, oral mucosa moist, no sclera icterus or thrush Respiratory system: Clear to auscultation. Respiratory effort normal. Cardiovascular system: S1 & S2 heard, RRR.   Gastrointestinal system: Abdomen soft, non-tender, nondistended. Normal bowel sounds. Central nervous system: Alert and oriented.  Extremities: No cyanosis, clubbing or edema Skin: Right foot in dressing-not opened Psychiatry:  Mood  & affect appropriate.     Data Reviewed: I have personally reviewed following labs and imaging studies  CBC: Recent Labs  Lab 04/26/21 0422  WBC 9.1  HGB 13.4  HCT 40.2  MCV 87.6  PLT 292   Basic Metabolic Panel: Recent Labs  Lab 04/22/21 1236 04/26/21 0422 04/28/21 0408  NA 132* 136 136  K 3.6 4.2 3.9  CL 97* 99 98  CO2 $Re'26 30 31  'HNk$ GLUCOSE 235* 153* 109*  BUN 24* 16 16  CREATININE 1.41* 1.38* 1.40*  CALCIUM 7.8* 8.3* 8.4*   GFR: Estimated Creatinine Clearance: 46.7 mL/min (A) (by C-G formula based on SCr of 1.4 mg/dL (H)). Liver Function Tests: Recent Labs  Lab 04/22/21 1236  AST 19  ALT 9  ALKPHOS 67  BILITOT 0.3  PROT 5.3*  ALBUMIN 1.9*   No results for input(s): LIPASE, AMYLASE in the last 168 hours. Recent Labs  Lab 04/26/21 1003  AMMONIA <10   Coagulation Profile: No results for input(s): INR, PROTIME in the last 168 hours. Cardiac Enzymes: No results for input(s): CKTOTAL, CKMB, CKMBINDEX, TROPONINI in the last 168 hours. BNP (last 3 results) No results for input(s): PROBNP in the last 8760 hours. HbA1C: No results for input(s): HGBA1C in the last 72 hours. CBG: Recent Labs  Lab 04/27/21 1121 04/27/21 1618 04/27/21 2329 04/28/21 0643 04/28/21 1117  GLUCAP 123* 187* 139* 132* 196*   Lipid Profile: No results for input(s): CHOL, HDL, LDLCALC, TRIG, CHOLHDL, LDLDIRECT in the last 72 hours. Thyroid Function Tests: No results for input(s): TSH, T4TOTAL, FREET4, T3FREE, THYROIDAB in the last 72 hours. Anemia Panel: No results for input(s): VITAMINB12, FOLATE, FERRITIN, TIBC, IRON, RETICCTPCT in the last 72 hours. Urine analysis:    Component Value Date/Time   COLORURINE YELLOW 04/18/2021 1635   APPEARANCEUR CLOUDY (A) 04/18/2021 1635   LABSPEC 1.025 04/18/2021 1635   PHURINE 5.5 04/18/2021 1635   GLUCOSEU >=500 (  A) 04/18/2021 1635   HGBUR LARGE (A) 04/18/2021 1635   BILIRUBINUR NEGATIVE 04/18/2021 1635   KETONESUR NEGATIVE 04/18/2021  1635   PROTEINUR 100 (A) 04/18/2021 1635   NITRITE NEGATIVE 04/18/2021 1635   LEUKOCYTESUR SMALL (A) 04/18/2021 1635   Sepsis Labs: $RemoveBefo'@LABRCNTIP'EBVzfbpcjVr$ (procalcitonin:4,lacticidven:4) ) Recent Results (from the past 240 hour(s))  Resp Panel by RT-PCR (Flu A&B, Covid) Nasopharyngeal Swab     Status: None   Collection Time: 04/18/21  4:35 PM   Specimen: Nasopharyngeal Swab; Nasopharyngeal(NP) swabs in vial transport medium  Result Value Ref Range Status   SARS Coronavirus 2 by RT PCR NEGATIVE NEGATIVE Final    Comment: (NOTE) SARS-CoV-2 target nucleic acids are NOT DETECTED.  The SARS-CoV-2 RNA is generally detectable in upper respiratory specimens during the acute phase of infection. The lowest concentration of SARS-CoV-2 viral copies this assay can detect is 138 copies/mL. A negative result does not preclude SARS-Cov-2 infection and should not be used as the sole basis for treatment or other patient management decisions. A negative result may occur with  improper specimen collection/handling, submission of specimen other than nasopharyngeal swab, presence of viral mutation(s) within the areas targeted by this assay, and inadequate number of viral copies(<138 copies/mL). A negative result must be combined with clinical observations, patient history, and epidemiological information. The expected result is Negative.  Fact Sheet for Patients:  EntrepreneurPulse.com.au  Fact Sheet for Healthcare Providers:  IncredibleEmployment.be  This test is no t yet approved or cleared by the [place] FDA and  has been authorized for detection and/or diagnosis of SARS-CoV-2 by FDA under an Emergency Use Authorization (EUA). This EUA will remain  in effect (meaning this test can be used) for the duration of the COVID-19 declaration under Section 564(b)(1) of the Act, 21 U.S.C.section 360bbb-3(b)(1), unless the authorization is terminated  or revoked sooner.        Influenza A by PCR NEGATIVE NEGATIVE Final   Influenza B by PCR NEGATIVE NEGATIVE Final    Comment: (NOTE) The Xpert Xpress SARS-CoV-2/FLU/RSV plus assay is intended as an aid in the diagnosis of influenza from Nasopharyngeal swab specimens and should not be used as a sole basis for treatment. Nasal washings and aspirates are unacceptable for Xpert Xpress SARS-CoV-2/FLU/RSV testing.  Fact Sheet for Patients: EntrepreneurPulse.com.au  Fact Sheet for Healthcare Providers: IncredibleEmployment.be  This test is not yet approved or cleared by the [place] FDA and has been authorized for detection and/or diagnosis of SARS-CoV-2 by FDA under an Emergency Use Authorization (EUA). This EUA will remain in effect (meaning this test can be used) for the duration of the COVID-19 declaration under Section 564(b)(1) of the Act, 21 U.S.C. section 360bbb-3(b)(1), unless the authorization is terminated or revoked.  Performed at [facility], [place] [address] [place], [place] [address]   Urine Culture     Status: Abnormal   Collection Time: 04/18/21  4:35 PM   Specimen: In/Out Cath Urine  Result Value Ref Range Status   Specimen Description IN/OUT CATH URINE  Final   Special Requests   Final    NONE Performed at [facility], [place] [address], [place], [place] [address]    Culture >=100,000 COLONIES/mL ESCHERICHIA COLI (A)  Final   Report Status 04/21/2021 FINAL  Final   Organism ID, Bacteria ESCHERICHIA COLI (A)  Final      Susceptibility   Escherichia coli - MIC*    AMPICILLIN >=32 RESISTANT Resistant     CEFAZOLIN >=64 RESISTANT Resistant  CEFEPIME <=0.12 SENSITIVE Sensitive     CEFTRIAXONE <=0.25 SENSITIVE Sensitive     CIPROFLOXACIN <=0.25 SENSITIVE Sensitive     GENTAMICIN <=1 SENSITIVE Sensitive     IMIPENEM <=0.25 SENSITIVE Sensitive     NITROFURANTOIN <=16 SENSITIVE Sensitive     TRIMETH/SULFA <=20 SENSITIVE Sensitive      AMPICILLIN/SULBACTAM >=32 RESISTANT Resistant     PIP/TAZO <=4 SENSITIVE Sensitive     * >=100,000 COLONIES/mL ESCHERICHIA COLI  Blood Culture (routine x 2)     Status: Abnormal   Collection Time: 04/18/21  4:40 PM   Specimen: BLOOD RIGHT FOREARM  Result Value Ref Range Status   Specimen Description BLOOD RIGHT FOREARM  Final   Special Requests   Final    BOTTLES DRAWN AEROBIC AND ANAEROBIC Blood Culture results may not be optimal due to an excessive volume of blood received in culture bottles   Culture  Setup Time   Final    GRAM POSITIVE COCCI IN CLUSTERS IN BOTH AEROBIC AND ANAEROBIC BOTTLES CRITICAL VALUE NOTED.  VALUE IS CONSISTENT WITH PREVIOUSLY REPORTED AND CALLED VALUE.    Culture (A)  Final    STAPHYLOCOCCUS AUREUS SUSCEPTIBILITIES PERFORMED ON PREVIOUS CULTURE WITHIN THE LAST 5 DAYS. Performed at [facility], [place] [address], [place], [place] [address]    Report Status 04/21/2021 FINAL  Final  Blood Culture (routine x 2)     Status: Abnormal   Collection Time: 04/18/21  5:10 PM   Specimen: BLOOD RIGHT HAND  Result Value Ref Range Status   Specimen Description BLOOD RIGHT HAND  Final   Special Requests   Final    BOTTLES DRAWN AEROBIC AND ANAEROBIC Blood Culture results may not be optimal due to an inadequate volume of blood received in culture bottles   Culture  Setup Time   Final    GRAM POSITIVE COCCI IN CLUSTERS IN BOTH AEROBIC AND ANAEROBIC BOTTLES CRITICAL RESULT CALLED TO, READ BACK BY AND VERIFIED WITH: Carla Drape [id number] AT 78 BY CM Performed at [facility], [place] [address] [place], [place] [address]    Culture STAPHYLOCOCCUS AUREUS (A)  Final   Report Status 04/21/2021 FINAL  Final   Organism ID, Bacteria STAPHYLOCOCCUS AUREUS  Final      Susceptibility   Staphylococcus aureus - MIC*    CIPROFLOXACIN <=0.5 SENSITIVE Sensitive     ERYTHROMYCIN <=0.25 SENSITIVE Sensitive     GENTAMICIN <=0.5 SENSITIVE Sensitive     OXACILLIN <=0.25  SENSITIVE Sensitive     TETRACYCLINE <=1 SENSITIVE Sensitive     VANCOMYCIN <=0.5 SENSITIVE Sensitive     TRIMETH/SULFA <=10 SENSITIVE Sensitive     CLINDAMYCIN <=0.25 SENSITIVE Sensitive     RIFAMPIN <=0.5 SENSITIVE Sensitive     Inducible Clindamycin NEGATIVE Sensitive     * STAPHYLOCOCCUS AUREUS  Blood Culture ID Panel (Reflexed)     Status: Abnormal   Collection Time: 04/18/21  5:10 PM  Result Value Ref Range Status   Enterococcus faecalis NOT DETECTED NOT DETECTED Final   Enterococcus Faecium NOT DETECTED NOT DETECTED Final   Listeria monocytogenes NOT DETECTED NOT DETECTED Final   Staphylococcus species DETECTED (A) NOT DETECTED Final    Comment: CRITICAL RESULT CALLED TO, READ BACK BY AND VERIFIED WITH: PHARMD C JACKSON [id number] AT 1037 BY CM    Staphylococcus aureus (BCID) DETECTED (A) NOT DETECTED Final    Comment: CRITICAL RESULT CALLED TO, READ BACK BY AND VERIFIED WITH: [place] [id number] AT 1037  BY CM    Staphylococcus epidermidis NOT DETECTED NOT DETECTED Final   Staphylococcus lugdunensis NOT DETECTED NOT DETECTED Final   Streptococcus species NOT DETECTED NOT DETECTED Final   Streptococcus agalactiae NOT DETECTED NOT DETECTED Final   Streptococcus pneumoniae NOT DETECTED NOT DETECTED Final   Streptococcus pyogenes NOT DETECTED NOT DETECTED Final   A.calcoaceticus-baumannii NOT DETECTED NOT DETECTED Final   Bacteroides fragilis NOT DETECTED NOT DETECTED Final   Enterobacterales NOT DETECTED NOT DETECTED Final   Enterobacter cloacae complex NOT DETECTED NOT DETECTED Final   Escherichia coli NOT DETECTED NOT DETECTED Final   Klebsiella aerogenes NOT DETECTED NOT DETECTED Final   Klebsiella oxytoca NOT DETECTED NOT DETECTED Final   Klebsiella pneumoniae NOT DETECTED NOT DETECTED Final   Proteus species NOT DETECTED NOT DETECTED Final   Salmonella species NOT DETECTED NOT DETECTED Final   Serratia marcescens NOT DETECTED NOT DETECTED Final   Haemophilus influenzae  NOT DETECTED NOT DETECTED Final   Neisseria meningitidis NOT DETECTED NOT DETECTED Final   Pseudomonas aeruginosa NOT DETECTED NOT DETECTED Final   Stenotrophomonas maltophilia NOT DETECTED NOT DETECTED Final   Candida albicans NOT DETECTED NOT DETECTED Final   Candida auris NOT DETECTED NOT DETECTED Final   Candida glabrata NOT DETECTED NOT DETECTED Final   Candida krusei NOT DETECTED NOT DETECTED Final   Candida parapsilosis NOT DETECTED NOT DETECTED Final   Candida tropicalis NOT DETECTED NOT DETECTED Final   Cryptococcus neoformans/gattii NOT DETECTED NOT DETECTED Final   Meth resistant mecA/C and MREJ NOT DETECTED NOT DETECTED Final    Comment: Performed at [facility], [address], [place], [place] [address]  Respiratory (~20 pathogens) panel by PCR     Status: None   Collection Time: 04/18/21  8:09 PM   Specimen: Nasopharyngeal Swab; Respiratory  Result Value Ref Range Status   Adenovirus NOT DETECTED NOT DETECTED Final   Coronavirus 229E NOT DETECTED NOT DETECTED Final    Comment: (NOTE) The Coronavirus on the Respiratory Panel, DOES NOT test for the novel  Coronavirus (2019 nCoV)    Coronavirus HKU1 NOT DETECTED NOT DETECTED Final   Coronavirus NL63 NOT DETECTED NOT DETECTED Final   Coronavirus OC43 NOT DETECTED NOT DETECTED Final   Metapneumovirus NOT DETECTED NOT DETECTED Final   Rhinovirus / Enterovirus NOT DETECTED NOT DETECTED Final   Influenza A NOT DETECTED NOT DETECTED Final   Influenza B NOT DETECTED NOT DETECTED Final   Parainfluenza Virus 1 NOT DETECTED NOT DETECTED Final   Parainfluenza Virus 2 NOT DETECTED NOT DETECTED Final   Parainfluenza Virus 3 NOT DETECTED NOT DETECTED Final   Parainfluenza Virus 4 NOT DETECTED NOT DETECTED Final   Respiratory Syncytial Virus NOT DETECTED NOT DETECTED Final   Bordetella pertussis NOT DETECTED NOT DETECTED Final   Bordetella Parapertussis NOT DETECTED NOT DETECTED Final   Chlamydophila pneumoniae NOT DETECTED  NOT DETECTED Final   Mycoplasma pneumoniae NOT DETECTED NOT DETECTED Final    Comment: Performed at [facility], [place] [address] [place], [place] [address]  MRSA Next Gen by PCR, Nasal     Status: None   Collection Time: 04/19/21  7:00 AM   Specimen: Nasal Mucosa; Nasal Swab  Result Value Ref Range Status   MRSA by PCR Next Gen NOT DETECTED NOT DETECTED Final    Comment: (NOTE) The GeneXpert MRSA Assay (FDA approved for NASAL specimens only), is one component of a comprehensive MRSA colonization surveillance program. It is not intended to diagnose  MRSA infection nor to guide or monitor treatment for MRSA infections. Test performance is not FDA approved in patients less than 93 years old. Performed at [facility], [place] [address] [place], [place] [address]   Culture, blood (routine x 2)     Status: None   Collection Time: 04/19/21  8:41 PM   Specimen: BLOOD RIGHT HAND  Result Value Ref Range Status   Specimen Description BLOOD RIGHT HAND  Final   Special Requests   Final    BOTTLES DRAWN AEROBIC AND ANAEROBIC Blood Culture adequate volume   Culture   Final    NO GROWTH 5 DAYS Performed at [facility], [place] [address], [place], [place] [address]    Report Status 04/24/2021 FINAL  Final  Culture, blood (routine x 2)     Status: None   Collection Time: 04/19/21  8:41 PM   Specimen: BLOOD  Result Value Ref Range Status   Specimen Description BLOOD LEFT ARM  Final   Special Requests   Final    BOTTLES DRAWN AEROBIC AND ANAEROBIC Blood Culture adequate volume   Culture   Final    NO GROWTH 5 DAYS Performed at [place] [address] [place], [place] [address]    Report Status 04/24/2021 FINAL  Final  Surgical pcr screen     Status: None   Collection Time: 04/20/21  5:42 AM   Specimen: Nasal Mucosa; Nasal Swab  Result Value Ref Range Status   MRSA, PCR NEGATIVE NEGATIVE Final   Staphylococcus aureus NEGATIVE NEGATIVE Final    Comment: (NOTE) The  Xpert SA Assay (FDA approved for NASAL specimens in patients 33 years of age and older), is one component of a comprehensive surveillance program. It is not intended to diagnose infection nor to guide or monitor treatment. Performed at [facility], [place] [address], [place], [place] [address]   Culture, fungus without smear     Status: None (Preliminary result)   Collection Time: 04/20/21 10:44 AM   Specimen: Toe, Right; Amputation  Result Value Ref Range Status   Specimen Description TOE  Final   Special Requests RIGHT TOE  Final   Culture   Final    NO FUNGUS ISOLATED AFTER 8 DAYS Performed at [facility], [address], [place], [place] [address]    Report Status PENDING  Incomplete  Aerobic/Anaerobic Culture w Gram Stain (surgical/deep wound)     Status: None   Collection Time: 04/20/21 10:44 AM   Specimen: Toe, Right; Amputation  Result Value Ref Range Status   Specimen Description TOE  Final   Special Requests RIGHT TOE  Final   Gram Stain   Final    RARE WBC PRESENT, PREDOMINANTLY MONONUCLEAR MODERATE GRAM POSITIVE COCCI RARE GRAM NEGATIVE RODS    Culture   Final    FEW STAPHYLOCOCCUS AUREUS MODERATE ESCHERICHIA COLI MODERATE KLEBSIELLA OXYTOCA NO ANAEROBES ISOLATED Performed at [facility], [place] [address], [place], [place] [address]    Report Status 04/25/2021 FINAL  Final   Organism ID, Bacteria ESCHERICHIA COLI  Final   Organism ID, Bacteria KLEBSIELLA OXYTOCA  Final   Organism ID, Bacteria STAPHYLOCOCCUS AUREUS  Final      Susceptibility   Escherichia coli - MIC*    AMPICILLIN >=32 RESISTANT Resistant     CEFAZOLIN >=64 RESISTANT Resistant     CEFEPIME <=0.12 SENSITIVE Sensitive     CEFTAZIDIME <=1 SENSITIVE Sensitive  CEFTRIAXONE 0.5 SENSITIVE Sensitive     CIPROFLOXACIN <=0.25 SENSITIVE Sensitive     GENTAMICIN <=1 SENSITIVE Sensitive     IMIPENEM <=0.25 SENSITIVE Sensitive     TRIMETH/SULFA <=20 SENSITIVE Sensitive      AMPICILLIN/SULBACTAM >=32 RESISTANT Resistant     PIP/TAZO 16 SENSITIVE Sensitive     * MODERATE ESCHERICHIA COLI   Klebsiella oxytoca - MIC*    AMPICILLIN >=32 RESISTANT Resistant     CEFAZOLIN 8 SENSITIVE Sensitive     CEFEPIME <=0.12 SENSITIVE Sensitive     CEFTAZIDIME <=1 SENSITIVE Sensitive     CEFTRIAXONE <=0.25 SENSITIVE Sensitive     CIPROFLOXACIN <=0.25 SENSITIVE Sensitive     GENTAMICIN <=1 SENSITIVE Sensitive     IMIPENEM <=0.25 SENSITIVE Sensitive     TRIMETH/SULFA <=20 SENSITIVE Sensitive     AMPICILLIN/SULBACTAM 8 SENSITIVE Sensitive     PIP/TAZO <=4 SENSITIVE Sensitive     * MODERATE KLEBSIELLA OXYTOCA   Staphylococcus aureus - MIC*    CIPROFLOXACIN <=0.5 SENSITIVE Sensitive     ERYTHROMYCIN <=0.25 SENSITIVE Sensitive     GENTAMICIN <=0.5 SENSITIVE Sensitive     OXACILLIN <=0.25 SENSITIVE Sensitive     TETRACYCLINE <=1 SENSITIVE Sensitive     VANCOMYCIN <=0.5 SENSITIVE Sensitive     TRIMETH/SULFA <=10 SENSITIVE Sensitive     CLINDAMYCIN <=0.25 SENSITIVE Sensitive     RIFAMPIN <=0.5 SENSITIVE Sensitive     Inducible Clindamycin NEGATIVE Sensitive     * FEW STAPHYLOCOCCUS AUREUS         Radiology Studies: DG Abd 1 View  Result Date: 04/26/2021 CLINICAL DATA:  Constipation. EXAM: ABDOMEN - 1 VIEW COMPARISON:  11/26/2007 FINDINGS: Diffuse gaseous distention of small bowel and colon evident with gas and stool visualized in the rectum. There is moderate gaseous distention of the stomach. No substantial stool volume evident. Degenerative changes noted lumbar spine. IMPRESSION: Nonspecific bowel gas pattern with diffuse mild gaseous distention of small bowel and colon. Electronically Signed   By: Misty Stanley M.D.   On: 04/26/2021 16:24   ECHO TEE  Result Date: 04/26/2021    TRANSESOPHOGEAL ECHO REPORT   Patient Name:   Zachary [name] Date of Exam: 04/26/2021 Medical Rec #:  [id number]       Height:       68.0 in Accession #:    [id number]      Weight:       185.6 lb  Date of Birth:  [birth year]       BSA:          1.980 m Patient Age:    76 years        BP:           116/52 mmHg Patient Gender: M               HR:           74 bpm. Exam Location:  Inpatient Procedure: Cardiac Doppler, Color Doppler, Transesophageal Echo, 3D Echo and            Saline Contrast Bubble Study Indications:    Bacteremia  History:        Patient has prior history of Echocardiogram examinations, most                 recent 04/20/2021. Aortic Valve Disease and Mitral Valve Disease.  Sonographer:    Clayton Lefort RDCS (AE) Referring Phys: [id number] Dawson: After discussion of the risks and benefits of a TEE,  an informed consent was obtained from the patient. TEE procedure time was 16 minutes. The transesophogeal probe was passed without difficulty through the esophogus of the patient. Imaged were obtained with the patient in a left lateral decubitus position. Sedation performed by different physician. The patient was monitored while under deep sedation. Anesthestetic sedation was provided intravenously by Anesthesiology: 165mg  of Propofol. Image quality was excellent. The patient's vital signs; including heart rate, blood pressure, and oxygen saturation; remained stable throughout the procedure. The patient developed no complications during the procedure. IMPRESSIONS  1. Left ventricular ejection fraction, by estimation, is 60 to 65%. The left ventricle has normal function.  2. Right ventricular systolic function is normal. The right ventricular size is normal.  3. Left atrial size was mildly dilated. No left atrial/left atrial appendage thrombus was detected. The LAA emptying velocity was 50 cm/s.  4. Right atrial size was mildly dilated.  5. Moderate calcific mitral stenosis is present. MG 7 mmHG @ 80 bpm. MVA by VTI 1.28 cm2, DI 0.45. MVA by direct 3D MPR 1.85 cm2. The mitral valve is degenerative. Mild mitral valve regurgitation. Moderate mitral stenosis. Moderate to severe mitral annular  calcification.  6. At least moderate AS is present. Gradients lower than TTE, likley due to anesthesia. The aortic valve is tricuspid. Aortic valve regurgitation is mild. Moderate aortic valve stenosis. Aortic valve area, by VTI measures 1.24 cm. Aortic valve mean gradient measures 14.0 mmHg. Aortic valve Vmax measures 2.47 m/s.  7. 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  8. Agitated saline contrast bubble study was negative, with no evidence of any interatrial shunt. Conclusion(s)/Recommendation(s): No evidence of vegetation/infective endocarditis on this transesophageael echocardiogram. FINDINGS  Left Ventricle: Left ventricular ejection fraction, by estimation, is 60 to 65%. The left ventricle has normal function. The left ventricular internal cavity size was normal in size. Right Ventricle: The right ventricular size is normal. No increase in right ventricular wall thickness. Right ventricular systolic function is normal. Left Atrium: Left atrial size was mildly dilated. No left atrial/left atrial appendage thrombus was detected. The LAA emptying velocity was 50 cm/s. Right Atrium: Right atrial size was mildly dilated. Pericardium: There is no evidence of pericardial effusion. Mitral Valve: Moderate calcific mitral stenosis is present. MG 7 mmHG @ 80 bpm. MVA by VTI 1.28 cm2, DI 0.45. MVA by direct 3D MPR 1.85 cm2. The mitral valve is degenerative in appearance. Moderate to severe mitral annular calcification. Mild mitral valve regurgitation. Moderate mitral valve stenosis. MV peak gradient, 17.0 mmHg. The mean mitral valve gradient is 7.0 mmHg with average heart rate of 80 bpm. There is no evidence of mitral valve vegetation. Tricuspid Valve: The tricuspid valve is normal in structure. Tricuspid valve regurgitation is trivial. No evidence of tricuspid stenosis. There is no evidence of tricuspid valve vegetation. Aortic Valve: At least moderate AS is present. Gradients  lower than TTE, likley due to anesthesia. The aortic valve is tricuspid. Aortic valve regurgitation is mild. Aortic regurgitation PHT measures 600 msec. Moderate aortic stenosis is present. Aortic valve mean gradient measures 14.0 mmHg. Aortic valve peak gradient measures 24.4 mmHg. Aortic valve area, by VTI measures 1.24 cm. There is no evidence of aortic valve vegetation. Pulmonic Valve: The pulmonic valve was normal in structure. Pulmonic valve regurgitation is trivial. No evidence of pulmonic stenosis. There is no evidence of pulmonic valve vegetation. Aorta: The aortic root and ascending aorta are structurally normal, with no evidence of dilitation. There is moderate (Grade  III) layered plaque involving the descending aorta. Venous: The left lower pulmonary vein, left upper pulmonary vein, right upper pulmonary vein and right lower pulmonary vein are normal. IAS/Shunts: The atrial septum is grossly normal. Agitated saline contrast was given intravenously to evaluate for intracardiac shunting. Agitated saline contrast bubble study was negative, with no evidence of any interatrial shunt.  LEFT VENTRICLE PLAX 2D LVOT diam:     1.90 cm LV SV:         57 LV SV Index:   29 LVOT Area:     2.84 cm  AORTIC VALVE AV Area (Vmax):    1.10 cm AV Area (Vmean):   1.13 cm AV Area (VTI):     1.24 cm AV Vmax:           247.00 cm/s AV Vmean:          173.000 cm/s AV VTI:            0.460 m AV Peak Grad:      24.4 mmHg AV Mean Grad:      14.0 mmHg LVOT Vmax:         96.20 cm/s LVOT Vmean:        69.200 cm/s LVOT VTI:          0.201 m LVOT/AV VTI ratio: 0.44 AI PHT:            600 msec  AORTA Ao Root diam: 2.78 cm MITRAL VALVE MV Area VTI:  1.28 cm    SHUNTS MV Peak grad: 17.0 mmHg   Systemic VTI:  0.20 m MV Mean grad: 7.0 mmHg    Systemic Diam: 1.90 cm MV Vmax:      2.06 m/s MV Vmean:     121.0 cm/s MV VTI:       0.44 m Eleonore Chiquito MD Electronically signed by Eleonore Chiquito MD Signature Date/Time: 04/26/2021/2:54:05 PM     Final       Scheduled Meds:  aspirin EC  81 mg Oral Daily   Chlorhexidine Gluconate Cloth  6 each Topical Daily   ciprofloxacin  500 mg Oral BID   clopidogrel  75 mg Oral Daily   diltiazem  120 mg Oral Daily   enoxaparin (LOVENOX) injection  40 mg Subcutaneous Q24H   insulin aspart  0-15 Units Subcutaneous TID WC   insulin aspart  0-5 Units Subcutaneous QHS   insulin aspart  3 Units Subcutaneous TID WC   insulin glargine-yfgn  24 Units Subcutaneous Daily   naloxegol oxalate  25 mg Oral BID   ofloxacin  1 drop Right Eye QID   polyethylene glycol  17 g Oral BID   senna-docusate  2 tablet Oral BID   sodium chloride flush  10-40 mL Intracatheter Q12H   Continuous Infusions:  sodium chloride 10 mL/hr at 04/24/21 2108    ceFAZolin (ANCEF) IV 2 g (04/28/21 0544)     LOS: 9 days      Debbe Odea, MD [facility] Pager: [email or web address] 04/28/2021, 11:51 AM

## 2021-04-28 NOTE — Plan of Care (Signed)
  Problem: Education: Goal: Knowledge of General Education information will improve Description Including pain rating scale, medication(s)/side effects and non-pharmacologic comfort measures Outcome: Progressing   Problem: Health Behavior/Discharge Planning: Goal: Ability to manage health-related needs will improve Outcome: Progressing   

## 2021-04-29 LAB — GLUCOSE, CAPILLARY
Glucose-Capillary: 106 mg/dL — ABNORMAL HIGH (ref 70–99)
Glucose-Capillary: 143 mg/dL — ABNORMAL HIGH (ref 70–99)

## 2021-04-29 MED ORDER — HEPARIN SOD (PORK) LOCK FLUSH 100 UNIT/ML IV SOLN
250.0000 [IU] | INTRAVENOUS | Status: AC | PRN
  Administered 2021-04-29: 250 [IU]
  Filled 2021-04-29: qty 2.5

## 2021-04-29 NOTE — Care Management Important Message (Signed)
Important Message  Patient Details  Name: Zachary [name] MRN: [id number] Date of Birth: 1945-05-14   Medicare Important Message Given:  Yes     Orbie Pyo 04/29/2021, 2:22 PM

## 2021-04-29 NOTE — TOC Transition Note (Signed)
Transition of Care [facility]) - CM/SW Discharge Note   Patient Details  Name: Zachary [name] MRN: [id number] Date of Birth: [birth year]  [facility] of [facility]) CM/SW Contact:  Milinda Antis, [place] Phone Number: 04/29/2021, 9:05 AM   Clinical Narrative:    Patient will DC to: [facility] Anticipated DC date: 04/29/2021 Family notified: Yes Transport by: Zachary [name]   Per MD patient ready for DC to SNF. RN to call report prior to discharge [phone] room 210. RN, patient, patient's family, and facility notified of DC. Discharge Summary and FL2 sent to facility. DC packet on chart. Ambulance transport requested will be requested for patient when RN reports that the patient is ready.   CSW will sign off for now as social work intervention is no longer needed. Please consult [place] again if new needs arise.     Final next level of care: Skilled Nursing Facility Barriers to Discharge: Barriers Resolved   Patient Goals and CMS Choice   CMS Medicare.gov Compare Post Acute Care list provided to:: Patient Choice offered to / list presented to : Patient  Discharge Placement              Patient chooses bed at: [facility] Patient to be transferred to facility by: [place] Name of family member notified: Zachary [name] Patient and family notified of of transfer: 04/29/21  Discharge Plan and Services                                     Social Determinants of Health (SDOH) Interventions     Readmission Risk Interventions No flowsheet data found.

## 2021-04-29 NOTE — Progress Notes (Signed)
Occupational Therapy Treatment Patient Details Name: Zachary [name] MRN: [id number] DOB: 1945/06/07 Today's Date: 04/29/2021   History of present illness Pt is a 76 yo male admitted for osteomyelitis of the R great toe. Pt underwent amputation of great toe on 1/10 w skin graft. Pt with MSSA bacteremia. PMH:  DM2, HLD, HTN, PVD, and L great toe amputation.   OT comments  Patient received in bed and agreeable to OT treatment. Patient was supervision to get to EOB with complaints of back pain. Patient performed transfer from bed to wheelchair with RW and performed self care tasks seated in recliner and stood for peri area cleaning. Patient is making good gains with OT treatment and is expected to discharged to SNF later today.    Recommendations for follow up therapy are one component of a multi-disciplinary discharge planning process, led by the attending physician.  Recommendations may be updated based on patient status, additional functional criteria and insurance authorization.    Follow Up Recommendations  Skilled nursing-short term rehab (<3 hours/day)    Assistance Recommended at Discharge Frequent or constant Supervision/Assistance  Patient can return home with the following  A little help with walking and/or transfers;A lot of help with bathing/dressing/bathroom   Equipment Recommendations  Other (comment) (TBD)    Recommendations for Other Services      Precautions / Restrictions Precautions Precautions: Fall;Other (comment) Precaution Comments: urinary frequency Other Brace: R CAM boot Restrictions Weight Bearing Restrictions: Yes RLE Weight Bearing: Weight bearing as tolerated Other Position/Activity Restrictions: WBAT with CAM boot       Mobility Bed Mobility Overal bed mobility: Needs Assistance Bed Mobility: Supine to Sit     Supine to sit: Supervision     General bed mobility comments: able to get to EOB with complaints of back pain    Transfers Overall  transfer level: Needs assistance Equipment used: Rolling walker (2 wheels) Transfers: Sit to/from Stand, Bed to chair/wheelchair/BSC Sit to Stand: Mod assist     Step pivot transfers: Min assist     General transfer comment: min assist to power up from EOB and recliner     Balance Overall balance assessment: Needs assistance Sitting-balance support: No upper extremity supported, Feet supported Sitting balance-Leahy Scale: Good     Standing balance support: Bilateral upper extremity supported, Reliant on assistive device for balance Standing balance-Leahy Scale: Poor Standing balance comment: Reliant on BUE on RW.                           ADL either performed or assessed with clinical judgement   ADL Overall ADL's : Needs assistance/impaired     Grooming: Wash/dry hands;Wash/dry face;Set up;Sitting Grooming Details (indicate cue type and reason): performed seated in recliner Upper Body Bathing: Set up;Sitting   Lower Body Bathing: Moderate assistance;Sit to/from stand;Cueing for compensatory techniques Lower Body Bathing Details (indicate cue type and reason): performed in recliner and stood for peri area cleaning Upper Body Dressing : Set up;Sitting                     General ADL Comments: self care performed seated in recliner due to back pain and stood for peri area cleaning    Extremity/Trunk Assessment              Vision       Perception     Praxis      Cognition Arousal/Alertness: Awake/alert Behavior During Therapy: [facility] for  tasks assessed/performed Overall Cognitive Status: Within Functional Limits for tasks assessed                                 General Comments: motivated towards therapy        Exercises      Shoulder Instructions       General Comments      Pertinent Vitals/ Pain       Pain Assessment Pain Assessment: 0-10 Pain Score: 5  Pain Location: back Pain Descriptors / Indicators: Sore,  Discomfort, Guarding, Grimacing Pain Intervention(s): Limited activity within patient's tolerance, Monitored during session, RN gave pain meds during session  Home Living                                          Prior Functioning/Environment              Frequency  Min 2X/week        Progress Toward Goals  OT Goals(current goals can now be found in the care plan section)  Progress towards OT goals: Progressing toward goals  Acute Rehab OT Goals Patient Stated Goal: go to rehab OT Goal Formulation: With patient Time For Goal Achievement: 05/05/21 Potential to Achieve Goals: Good ADL Goals Pt Will Perform Lower Body Bathing: with supervision;sit to/from stand Pt Will Perform Lower Body Dressing: with supervision;sit to/from stand Pt Will Transfer to Toilet: with supervision;ambulating;bedside commode;regular height toilet  Plan Discharge plan needs to be updated;Frequency remains appropriate    Co-evaluation                 AM-PAC OT "6 Clicks" Daily Activity     Outcome Measure   Help from another person eating meals?: None Help from another person taking care of personal grooming?: None Help from another person toileting, which includes using toliet, bedpan, or urinal?: A Lot Help from another person bathing (including washing, rinsing, drying)?: A Lot Help from another person to put on and taking off regular upper body clothing?: None Help from another person to put on and taking off regular lower body clothing?: A Lot 6 Click Score: 18    End of Session Equipment Utilized During Treatment: Gait belt;Rolling walker (2 wheels)  OT Visit Diagnosis: Unsteadiness on feet (R26.81);Other abnormalities of gait and mobility (R26.89)   Activity Tolerance Patient tolerated treatment well   Patient Left in chair;with call bell/phone within reach;with chair alarm set   Nurse Communication Mobility status        Time: 9407-6808 OT Time  Calculation (min): 27 min  Charges: OT General Charges $OT Visit: 1 Visit OT Treatments $Self Care/Home Management : 23-37 mins  Lodema Hong, [place]  Pager [phone] Office [place] 04/29/2021, 8:36 AM

## 2021-04-29 NOTE — TOC Progression Note (Deleted)
Transition of Care [facility]) - Initial/Assessment Note    Patient Details  Name: Zachary [name] MRN: [id number] Date of Birth: 1946/02/15  [facility]) CM/SW Contact:    Milinda Antis, [place] Phone Number: 04/29/2021, 9:00 AM  Clinical Narrative:                  Expected Discharge Plan: [place] Barriers to Discharge: Insurance Authorization, SNF Pending bed offer   Patient Goals and CMS Choice   CMS Medicare.gov Compare Post Acute Care list provided to:: Patient Choice offered to / list presented to : Patient  Expected Discharge Plan and Services Expected Discharge Plan: [place]       Living arrangements for the past 2 months: Single Family Home Expected Discharge Date: 04/29/21                                    Prior Living Arrangements/Services Living arrangements for the past 2 months: Single Family Home Lives with:: Self, Spouse Patient language and need for interpreter reviewed:: Yes Do you feel safe going back to the place where you live?: Yes      Need for Family Participation in Patient Care: Yes (Comment) Care giver support system in place?: Yes (comment)   Criminal Activity/Legal Involvement Pertinent to Current Situation/Hospitalization: No - Comment as needed  Activities of Daily Living Home Assistive Devices/Equipment: None ADL Screening (condition at time of admission) Patient's cognitive ability adequate to safely complete daily activities?: Yes Is the patient deaf or have difficulty hearing?: No Does the patient have difficulty seeing, even when wearing glasses/contacts?: No Does the patient have difficulty concentrating, remembering, or making decisions?: No Patient able to express need for assistance with ADLs?: Yes Does the patient have difficulty dressing or bathing?: No Independently performs ADLs?: Yes (appropriate for developmental age) Does the patient have difficulty walking or climbing  stairs?: Yes Weakness of Legs: Both Weakness of Arms/Hands: None  Permission Sought/Granted   Permission granted to share information with : Yes, Verbal Permission Granted     Permission granted to share info w AGENCY: SNF        Emotional Assessment Appearance:: Appears older than stated age Attitude/Demeanor/Rapport: Engaged Affect (typically observed): Pleasant, Adaptable Orientation: : Oriented to Self, Oriented to Place, Oriented to  Time, Oriented to Situation Alcohol / Substance Use: Not Applicable Psych Involvement: No (comment)  Admission diagnosis:  Bacteremia [R78.81] Dyspnea [R06.00] Osteomyelitis ([place]) [M86.9] Sepsis, due to unspecified organism, unspecified whether acute organ dysfunction present [facility]) [A41.9] Patient Active Problem List   Diagnosis Date Noted   Myoclonic jerking- right hand 04/26/2021   Constipation 04/25/2021   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 coli UTI 04/21/2021   MSSA bacteremia 04/19/2021   Osteomyelitis of great toe of right foot ([place]) 04/18/2021   sepsis secondary to pneumonia vs. 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   Headache 03/24/2020   Hyperlipidemia, unspecified 03/24/2020   Hyperlipidemia 03/24/2020   Long term (current) use of insulin ([place]) 03/24/2020  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 [place] [address] Phone: [phone] Fax: [place] [phone] - [place], [place] [address] Phone: [phone] Fax: [phone]     Social Determinants of Health (SDOH) Interventions    Readmission Risk Interventions No flowsheet data found.

## 2021-04-29 NOTE — Progress Notes (Signed)
DISCHARGE NOTE SNF [facility] to be discharged Skilled nursing facility per MD order. Patient verbalized understanding.  Skin clean, dry and intact without evidence of skin break down, no evidence of skin tears noted. IV catheter discontinued intact. Site without signs and symptoms of complications. Dressing and pressure applied. Pt denies pain at the site currently. No complaints noted.  Patient free of lines, drains, and wounds.   Discharge packet assembled. An After Visit Summary (AVS) was printed and given to the EMS personnel. Patient escorted via stretcher and discharged to [facility] via ambulance. Report called to accepting facility; all questions and concerns addressed.   Arlyss Repress, RN

## 2021-05-06 ENCOUNTER — Telehealth: Payer: Self-pay | Admitting: *Deleted

## 2021-05-06 ENCOUNTER — Other Ambulatory Visit: Payer: Self-pay

## 2021-05-06 ENCOUNTER — Ambulatory Visit (INDEPENDENT_AMBULATORY_CARE_PROVIDER_SITE_OTHER): Payer: Medicare PPO | Admitting: Podiatry

## 2021-05-06 DIAGNOSIS — Z89419 Acquired absence of unspecified great toe: Secondary | ICD-10-CM

## 2021-05-06 DIAGNOSIS — E1142 Type 2 diabetes mellitus with diabetic polyneuropathy: Secondary | ICD-10-CM

## 2021-05-06 DIAGNOSIS — I739 Peripheral vascular disease, unspecified: Secondary | ICD-10-CM

## 2021-05-06 NOTE — Telephone Encounter (Signed)
Zachary [name] w/ [facility]([phone])is wanting to make the doctor aware that the patient's wound is draining and bleeding,getting worse.There is slough in wound bed.He is not wearing his cam boot when up ambulating per the staff.

## 2021-05-11 ENCOUNTER — Other Ambulatory Visit (HOSPITAL_COMMUNITY): Payer: Self-pay | Admitting: Interventional Radiology

## 2021-05-11 DIAGNOSIS — I739 Peripheral vascular disease, unspecified: Secondary | ICD-10-CM

## 2021-05-11 LAB — CULTURE, FUNGUS WITHOUT SMEAR

## 2021-05-11 NOTE — Progress Notes (Signed)
Subjective:  Patient ID: Zachary [name], male    DOB: [birth year],  MRN: [id number]  Chief Complaint  Patient presents with   Wound Check    DOS: 04/20/2021 Procedure: Right hallux amputation  76 y.o. male returns for post-op check.  Patient states he is doing well.  He has been noncompliant with cam boot.  He has not been wearing the boot while ambulating.  He states that he is doing okay.  Minimal pain.  There is some bleeding noted.  He is a diabetic with A1c of 8.5  Review of Systems: Negative except as noted in the HPI. Denies N/V/F/Ch.  Past Medical History:  Diagnosis Date   Diabetes mellitus    Hypercholesteremia    Hypertension    Patella fracture    left   PVD (peripheral vascular disease) (HCC)     Current Outpatient Medications:    acetaminophen (TYLENOL) 500 MG tablet, Take 1,000 mg by mouth every 8 (eight) hours as needed for moderate pain., Disp: , Rfl:    aspirin EC 81 MG tablet, Take 81 mg by mouth daily., Disp: , Rfl:    B-D ULTRAFINE III SHORT PEN 31G X 8 MM MISC, Inject into the skin 5 (five) times daily., Disp: , Rfl:    calcium carbonate (TUMS - DOSED IN MG ELEMENTAL CALCIUM) 500 MG chewable tablet, Chew 1,000 mg by mouth daily as needed for indigestion or heartburn., Disp: , Rfl:    ceFAZolin (ANCEF) IVPB, Inject 2 g into the vein every 8 (eight) hours. Indication: MSSA bacteremia/osteomyelitis  First Dose: Yes Last Day of Therapy:  06/14/2021 Labs - Once weekly:  CBC/D and BMP, Labs - Every other week:  ESR and CRP Method of administration: IV Push Method of administration may be changed at the discretion of home infusion pharmacist based upon assessment of the patient and/or caregiver's ability to self-administer the medication ordered., Disp: 144 Units, Rfl: 0   ciprofloxacin (CIPRO) 500 MG tablet, Take 1 tablet (500 mg total) by mouth 2 (two) times daily., Disp: , Rfl:    clopidogrel (PLAVIX) 75 MG tablet, Take 75 mg by mouth daily., Disp: , Rfl:    diltiazem  (CARDIZEM CD) 120 MG 24 hr capsule, Take 120 mg by mouth daily., Disp: , Rfl:    glucose blood (FREESTYLE LITE) test strip, TEST 3 TIMES A DAY E11.9, Disp: , Rfl:    insulin aspart (NOVOLOG) 100 UNIT/ML injection, Inject 3 Units into the skin 3 (three) times daily with meals., Disp: 10 mL, Rfl: 11   insulin glargine-yfgn (SEMGLEE) 100 UNIT/ML injection, Inject 0.24 mLs (24 Units total) into the skin daily., Disp: 10 mL, Rfl: 11   lactulose (CHRONULAC) 10 GM/15ML solution, Take 30 mLs (20 g total) by mouth 2 (two) times daily as needed for mild constipation or moderate constipation., Disp: 236 mL, Rfl: 0   Lancets (FREESTYLE) lancets, AS DIRECTED THREE TIMES A DAY, Disp: , Rfl:    polyethylene glycol (MIRALAX / GLYCOLAX) 17 g packet, Take 17 g by mouth 2 (two) times daily., Disp: 14 each, Rfl: 0   senna-docusate (SENOKOT-S) 8.6-50 MG tablet, Take 2 tablets by mouth 2 (two) times daily., Disp: , Rfl:   Social History   Tobacco Use  Smoking Status Never  Smokeless Tobacco Never    Allergies  Allergen Reactions   Metformin Hcl Other (See Comments)    Unknown reaction   Pravastatin Sodium Other (See Comments)    Muscle pain    Objective:  There  were no vitals filed for this visit. There is no height or weight on file to calculate BMI. Constitutional Well developed. Well nourished.  Vascular Foot warm and well perfused. Capillary refill normal to all digits.   Neurologic Normal speech. Oriented to person, place, and time. Epicritic sensation to light touch grossly present bilaterally.  Dermatologic Skin healing well without signs of infection. Skin edges well coapted without signs of infection.  Orthopedic: Tenderness to palpation noted about the surgical site.   Radiographs: None Assessment:   1. History of amputation of great toe ([place])   2. PAD (peripheral artery disease) ([place])   3. DM type 2 with diabetic peripheral neuropathy ([place])    Plan:  Patient was evaluated and treated  and all questions answered.  S/p foot surgery right -Progressing as expected post-operatively. -XR: None -WB Status: Weightbearing as tolerated in cam boot -Sutures: Intact.  No clinical signs of dehiscence noted.  No complication noted. -Medications: None -Foot redressed. -Patient is scheduled to undergo angiogram of the left lower extremity with Dr. [name] in February  No follow-ups on file.

## 2021-05-13 ENCOUNTER — Other Ambulatory Visit: Payer: Self-pay | Admitting: Radiology

## 2021-05-13 ENCOUNTER — Other Ambulatory Visit (HOSPITAL_COMMUNITY): Payer: Self-pay | Admitting: Physician Assistant

## 2021-05-14 ENCOUNTER — Other Ambulatory Visit (HOSPITAL_COMMUNITY): Payer: Self-pay | Admitting: Interventional Radiology

## 2021-05-14 ENCOUNTER — Ambulatory Visit (HOSPITAL_COMMUNITY)
Admission: RE | Admit: 2021-05-14 | Discharge: 2021-05-14 | Disposition: A | Discharge status: Home / Self Care | Payer: Medicare PPO | Source: Ambulatory Visit | Attending: Interventional Radiology | Admitting: Interventional Radiology

## 2021-05-14 ENCOUNTER — Other Ambulatory Visit: Payer: Self-pay

## 2021-05-14 DIAGNOSIS — E785 Hyperlipidemia, unspecified: Secondary | ICD-10-CM | POA: Insufficient documentation

## 2021-05-14 DIAGNOSIS — I10 Essential (primary) hypertension: Secondary | ICD-10-CM | POA: Diagnosis not present

## 2021-05-14 DIAGNOSIS — I70212 Atherosclerosis of native arteries of extremities with intermittent claudication, left leg: Secondary | ICD-10-CM | POA: Insufficient documentation

## 2021-05-14 DIAGNOSIS — E1151 Type 2 diabetes mellitus with diabetic peripheral angiopathy without gangrene: Secondary | ICD-10-CM | POA: Insufficient documentation

## 2021-05-14 DIAGNOSIS — Z7902 Long term (current) use of antithrombotics/antiplatelets: Secondary | ICD-10-CM | POA: Diagnosis not present

## 2021-05-14 DIAGNOSIS — Z794 Long term (current) use of insulin: Secondary | ICD-10-CM | POA: Diagnosis not present

## 2021-05-14 DIAGNOSIS — I739 Peripheral vascular disease, unspecified: Secondary | ICD-10-CM

## 2021-05-14 HISTORY — PX: IR ANGIOGRAM EXTREMITY LEFT: IMG651

## 2021-05-14 HISTORY — PX: IR FEM POP INC ATHEREC / STENT / PTA MOD SED: IMG2312

## 2021-05-14 HISTORY — PX: IR US GUIDE VASC ACCESS RIGHT: IMG2390

## 2021-05-14 LAB — BASIC METABOLIC PANEL
Anion gap: 9 (ref 5–15)
BUN: 13 mg/dL (ref 8–23)
CO2: 27 mmol/L (ref 22–32)
Calcium: 8.7 mg/dL — ABNORMAL LOW (ref 8.9–10.3)
Chloride: 102 mmol/L (ref 98–111)
Creatinine, Ser: 1.36 mg/dL — ABNORMAL HIGH (ref 0.61–1.24)
GFR, Estimated: 54 mL/min — ABNORMAL LOW (ref >60)
Glucose, Bld: 124 mg/dL — ABNORMAL HIGH (ref 70–99)
Potassium: 4 mmol/L (ref 3.5–5.1)
Sodium: 138 mmol/L (ref 135–145)

## 2021-05-14 LAB — CBC
HCT: 38.7 % — ABNORMAL LOW (ref 39.0–52.0)
Hemoglobin: 12.6 g/dL — ABNORMAL LOW (ref 13.0–17.0)
MCH: 28.4 pg (ref 26.0–34.0)
MCHC: 32.6 g/dL (ref 30.0–36.0)
MCV: 87.2 fL (ref 80.0–100.0)
Platelets: 232 10*3/uL (ref 150–400)
RBC: 4.44 MIL/uL (ref 4.22–5.81)
RDW: 13.1 % (ref 11.5–15.5)
WBC: 7.6 10*3/uL (ref 4.0–10.5)
nRBC: 0 % (ref 0.0–0.2)

## 2021-05-14 LAB — GLUCOSE, CAPILLARY: Glucose-Capillary: 119 mg/dL — ABNORMAL HIGH (ref 70–99)

## 2021-05-14 LAB — PROTIME-INR
INR: 1 (ref 0.8–1.2)
Prothrombin Time: 13 seconds (ref 11.4–15.2)

## 2021-05-14 MED ORDER — ASCORBIC ACID 500 MG PO TABS
1000.0000 mg | ORAL_TABLET | Freq: Once | ORAL | Status: AC
  Administered 2021-05-14: 1000 mg via ORAL
  Filled 2021-05-14: qty 2

## 2021-05-14 MED ORDER — IOHEXOL 300 MG/ML  SOLN
100.0000 mL | Freq: Once | INTRAMUSCULAR | Status: AC | PRN
  Administered 2021-05-14: 50 mL via INTRAVENOUS

## 2021-05-14 MED ORDER — FENTANYL CITRATE (PF) 100 MCG/2ML IJ SOLN
INTRAMUSCULAR | Status: AC
  Filled 2021-05-14: qty 4

## 2021-05-14 MED ORDER — FENTANYL CITRATE (PF) 100 MCG/2ML IJ SOLN
INTRAMUSCULAR | Status: AC | PRN
  Administered 2021-05-14: 25 ug via INTRAVENOUS
  Administered 2021-05-14: 50 ug via INTRAVENOUS

## 2021-05-14 MED ORDER — SODIUM CHLORIDE 0.9 % IV SOLN: INTRAVENOUS | Status: AC

## 2021-05-14 MED ORDER — ALTEPLASE 2 MG IJ SOLR: 4.0000 mg | Freq: Once | INTRAMUSCULAR | Status: AC

## 2021-05-14 MED ORDER — HEPARIN SODIUM (PORCINE) 1000 UNIT/ML IJ SOLN
INTRAMUSCULAR | Status: AC | PRN
  Administered 2021-05-14: 7500 [IU] via INTRAVENOUS

## 2021-05-14 MED ORDER — MIDAZOLAM HCL 2 MG/2ML IJ SOLN
INTRAMUSCULAR | Status: AC
  Filled 2021-05-14: qty 4

## 2021-05-14 MED ORDER — HEPARIN SODIUM (PORCINE) 1000 UNIT/ML IJ SOLN
INTRAMUSCULAR | Status: AC
  Filled 2021-05-14: qty 10

## 2021-05-14 MED ORDER — SODIUM CHLORIDE 0.9 % IV SOLN: INTRAVENOUS | Status: DC

## 2021-05-14 MED ORDER — ALTEPLASE 2 MG IJ SOLR: INTRAMUSCULAR | Status: AC | PRN

## 2021-05-14 MED ORDER — MIDAZOLAM HCL 2 MG/2ML IJ SOLN
INTRAMUSCULAR | Status: AC | PRN
  Administered 2021-05-14: 1 mg via INTRAVENOUS
  Administered 2021-05-14 (×2): .5 mg via INTRAVENOUS

## 2021-05-14 MED ORDER — LIDOCAINE HCL 1 % IJ SOLN
INTRAMUSCULAR | Status: AC
  Administered 2021-05-14: 5 mL
  Filled 2021-05-14: qty 20

## 2021-05-14 MED ORDER — ALTEPLASE 2 MG IJ SOLR
INTRAMUSCULAR | Status: AC
  Administered 2021-05-14: 4 mg
  Filled 2021-05-14: qty 4

## 2021-05-14 NOTE — H&P (Signed)
Chief Complaint: Patient was seen in consultation today for left lower extremity critical limb ischemia  Referring Physician(s): Nicholes Rough  Supervising Physician: Gilmer Mor  Patient Status: [facility] - Out-pt  History of Present Illness: Zachary [name] is a 76 y.o. male with a past medical history Zachary [name] is a 76 y.o. male with a past medical history significant for HTN, HLD, DM and PVD s/p treatment of right SFA/pop mx focal disease with DAART therapy 03/03/21 and treatment of occluded right posterior tibial artery with balloon angioplasty 04/14/21 with Dr. Loreta Ave who presents today for left lower extremity angiogram and possible intervention due to critical LLE ischemia. 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 After discussion between Dr. Loreta Ave and Dr. Marylene Land decision was made to proceed with antibiotics and proceed with LLE angiogram as an outpatient for which he presents today.  Zachary [name] denies any complaints today, his right leg has been healing well. He has been taking his Plavix daily but takes his ASA "occasionally." He has no been wearing compression stockings or CAM boot. He denies any abnormal bleeding.   Past Medical History:  Diagnosis Date   Diabetes mellitus    Hypercholesteremia    Hypertension    Patella fracture    left   PVD (peripheral vascular disease) (HCC)     Past Surgical History:  Procedure Laterality Date   AMPUTATION     AMPUTATION  11/28/2011   Procedure: AMPUTATION RAY;  Surgeon: Cammy Copa, MD;  Location: [facility] ORS;  Service: Orthopedics;  Laterality: Left;  left 2nd toe amputation   AMPUTATION TOE Right 09/14/2019   Procedure:  AMPUTATION TOE RIGHT SECOND TOE;  Surgeon: Park Liter, [name];  Location: [facility] ORS;  Service: Podiatry;  Laterality: Right;   AMPUTATION TOE Right 04/20/2021   Procedure: AMPUTATION GREAT TOE; APPLICATION OF SKIN GRAFT SUBSTITUTE;  Surgeon: Park Liter, [name];  Location: [facility] OR;  Service: Podiatry;  Laterality: Right;   BUBBLE STUDY  04/26/2021   Procedure: BUBBLE STUDY;  Surgeon: Sande Rives, MD;  Location: [facility] ENDOSCOPY;  Service: Cardiovascular;;   IR ANGIOGRAM EXTREMITY BILATERAL  03/03/2021   IR ANGIOGRAM EXTREMITY RIGHT  03/03/2021   IR ANGIOGRAM EXTREMITY RIGHT  04/14/2021   IR FEM POP ART PTA MOD SED  03/03/2021   IR INTRAVASCULAR ULTRASOUND NON CORONARY  03/03/2021   IR RADIOLOGIST EVAL & MGMT  09/27/2019   IR RADIOLOGIST EVAL & MGMT  12/18/2019   IR RADIOLOGIST EVAL & MGMT  02/11/2021   IR RADIOLOGIST EVAL & MGMT  04/09/2021   IR TIB-PERO ART ATHEREC INC PTA MOD SED  03/03/2021   IR TIB-PERO ART PTA MOD SED  04/14/2021   IR US GUIDE VASC ACCESS LEFT  03/03/2021   IR US GUIDE VASC ACCESS RIGHT  04/14/2021   ORIF PATELLA Left 01/10/2019   Procedure: OPEN REDUCTION INTERNAL (ORIF) FIXATION LEFT PATELLA FRACTURE;  Surgeon: Tarry Kos, MD;  Location: [facility];  Service: Orthopedics;  Laterality: Left;   TEE WITHOUT CARDIOVERSION N/A 04/26/2021   Procedure: TRANSESOPHAGEAL ECHOCARDIOGRAM (TEE);  Surgeon: Sande Rives, MD;  Location: [facility] ENDOSCOPY;  Service: Cardiovascular;  Laterality: N/A;  Allergies: Metformin hcl and Pravastatin sodium  Medications: Prior to Admission medications   Medication Sig Start Date End Date Taking? Authorizing Provider  acetaminophen (TYLENOL) 500 MG tablet Take 1,000 mg by mouth See admin instructions. Take 1000 mg 3 times daily, may take 1000 mg every 8 hours as needed for mild/moderate pain   Yes [provider]  aspirin EC 81 MG tablet Take 81 mg by mouth daily.   Yes [provider]  calcium carbonate  (TUMS - DOSED IN MG ELEMENTAL CALCIUM) 500 MG chewable tablet Chew 1,000 mg by mouth daily as needed for indigestion or heartburn.   Yes [provider]  ceFAZolin (ANCEF) IVPB Inject 2 g into the vein every 8 (eight) hours. Indication: MSSA bacteremia/osteomyelitis  First Dose: Yes Last Day of Therapy:  06/14/2021 Labs - Once weekly:  CBC/D and BMP, Labs - Every other week:  ESR and CRP Method of administration: IV Push Method of administration may be changed at the discretion of home infusion pharmacist based upon assessment of the patient and/or caregiver's ability to self-administer the medication ordered. 04/28/21 06/15/21 Yes Calvert Cantor, MD  ciprofloxacin (CIPRO) 500 MG tablet Take 1 tablet (500 mg total) by mouth 2 (two) times daily. 04/28/21  Yes Calvert Cantor, MD  clopidogrel (PLAVIX) 75 MG tablet Take 75 mg by mouth daily.   Yes [provider]  diltiazem (CARDIZEM CD) 120 MG 24 hr capsule Take 120 mg by mouth daily. 03/16/20  Yes [provider]  famotidine (PEPCID) 20 MG tablet Take 20 mg by mouth every evening.   Yes [provider]  insulin aspart (NOVOLOG) 100 UNIT/ML injection Inject 3 Units into the skin 3 (three) times daily with meals. 04/28/21  Yes Calvert Cantor, MD  insulin glargine (LANTUS SOLOSTAR) 100 UNIT/ML Solostar Pen Inject 24 Units into the skin at bedtime.   Yes [provider]  lactulose (CHRONULAC) 10 GM/15ML solution Take 30 mLs (20 g total) by mouth 2 (two) times daily as needed for mild constipation or moderate constipation. 04/28/21  Yes Calvert Cantor, MD  NON FORMULARY Apply 1 application topically 2 (two) times daily. Skin Prep, apply to bilateral heels   Yes [provider]  pantoprazole (PROTONIX) 40 MG tablet Take 40 mg by mouth daily.   Yes [provider]  polyethylene glycol (MIRALAX / GLYCOLAX) 17 g packet Take 17 g by mouth 2 (two) times daily. 04/28/21  Yes Calvert Cantor, MD  Povidone-Iodine  (BETADINE EX) Apply 1 application topically 2 (two) times daily. To left foot and left foot 4th digit   Yes [provider]  senna-docusate (SENOKOT-S) 8.6-50 MG tablet Take 2 tablets by mouth 2 (two) times daily. 04/28/21  Yes Rizwan, Ladell Heads, MD  B-D ULTRAFINE III SHORT PEN 31G X 8 MM MISC Inject into the skin 5 (five) times daily. 11/26/19   [provider]  glucose blood (FREESTYLE LITE) test strip TEST 3 TIMES A DAY E11.9    [provider]  Lancets (FREESTYLE) lancets AS DIRECTED THREE TIMES A DAY    [provider]     No family history on file.  Social History   Socioeconomic History   Marital status: Married    Spouse name: Not on file   Number of children: Not on file   Years of education: Not on file   Highest education level: Not on file  Occupational History   Not on file  Tobacco Use   Smoking status: Never  Smokeless tobacco: Never  Substance and Sexual Activity   Alcohol use: No   Drug use: No   Sexual activity: Not on file  Other Topics Concern   Not on file  Social History Narrative   Not on file   Social Determinants of Health   Financial Resource Strain: Not on file  Food Insecurity: Not on file  Transportation Needs: Not on file  Physical Activity: Not on file  Stress: Not on file  Social Connections: Not on file     Review of Systems: A 12 point ROS discussed and pertinent positives are indicated in the HPI above.  All other systems are negative.  Review of Systems  Constitutional:  Negative for chills and fever.  Respiratory:  Negative for cough and shortness of breath.   Cardiovascular:  Negative for chest pain.  Gastrointestinal:  Negative for abdominal pain, diarrhea, nausea and vomiting.  Musculoskeletal:  Negative for back pain.  Neurological:  Negative for dizziness and headaches.   Vital Signs: BP (!) 137/59 (BP Location: Right Arm)    Pulse 90    Temp 98 F (36.7 C) (Oral)    Resp 17    Ht $R'5\' 8"'pv$   (1.727 m)    Wt 171 lb (77.6 kg)    SpO2 99%    BMI 26.00 kg/m   Physical Exam Vitals reviewed.  Constitutional:      General: He is not in acute distress. HENT:     Head: Normocephalic.     Mouth/Throat:     Mouth: Mucous membranes are moist.     Pharynx: Oropharynx is clear. No oropharyngeal exudate or posterior oropharyngeal erythema.  Cardiovascular:     Rate and Rhythm: Normal rate and regular rhythm.  Pulmonary:     Effort: Pulmonary effort is normal.     Breath sounds: Normal breath sounds.  Abdominal:     General: There is no distension.     Palpations: Abdomen is soft.     Tenderness: There is no abdominal tenderness.  Skin:    General: Skin is warm and dry.  Neurological:     Mental Status: He is alert and oriented to person, place, and time.  Psychiatric:        Mood and Affect: Mood normal.        Behavior: Behavior normal.        Thought Content: Thought content normal.        Judgment: Judgment normal.     MD Evaluation Airway: WNL Heart: WNL Abdomen: WNL Chest/ Lungs: WNL ASA  Classification: 3 Mallampati/Airway Score: Two   Imaging: DG Abd 1 View  Result Date: 04/26/2021 CLINICAL DATA:  Constipation. EXAM: ABDOMEN - 1 VIEW COMPARISON:  11/26/2007 FINDINGS: Diffuse gaseous distention of small bowel and colon evident with gas and stool visualized in the rectum. There is moderate gaseous distention of the stomach. No substantial stool volume evident. Degenerative changes noted lumbar spine. IMPRESSION: Nonspecific bowel gas pattern with diffuse mild gaseous distention of small bowel and colon. Electronically Signed   By: Misty Stanley M.D.   On: 04/26/2021 16:24   MR LUMBAR SPINE WO CONTRAST  Result Date: 04/23/2021 CLINICAL DATA:  Initial evaluation for acute low back pain, infection suspected. History of MSSA bacteremia. EXAM: MRI LUMBAR SPINE WITHOUT CONTRAST TECHNIQUE: Multiplanar, multisequence MR imaging of the lumbar spine was performed. No  intravenous contrast was administered. COMPARISON:  None available. FINDINGS: Segmentation:  Examination degraded by motion artifact. Standard segmentation. Lowest well-formed disc space labeled  the L5-S1 level. Alignment: Physiologic with preservation of the normal lumbar lordosis. No listhesis. Vertebrae: Vertebral body height maintained without acute or chronic fracture. Bone marrow signal intensity within normal limits. Reactive endplate change about the L2-3 and L4-5 interspaces favored to be degenerative in nature. Endplate irregularity with reactive endplate change present about the L4-5 interspace. Minimal edema within the intervening L4-5 disc itself. Findings are favored to be degenerative in nature. Similarly, mild reactive endplate change about the L2-3 interspace also favored to be degenerative. There is reactive marrow edema with small joint effusion about the left L4-5 facet (series 3, image 11). Diffuse edema throughout the left greater than right posterior paraspinous soft tissues. While these findings could potentially be degenerative, possible septic arthritis could be considered given provided history. Conus medullaris and cauda equina: Conus extends to the T12-L1 level. Conus medullaris within normal limits. There is an irregular lobulated epidural collection involving the dorsal and left epidural space extending from T12 through L4-5 (series 3, image 9). Finding is concerning for epidural abscess. This is most pronounced at the levels of L3-4 and L4-5 where this collection measures up to approximately 7 mm in maximal AP diameter (series 5, image 16). Proximal nerve roots of the cauda equina are somewhat irregular due to distal stenosis. Paraspinal and other soft tissues: Diffuse edema throughout the left greater than right posterior paraspinous soft tissues as above. No visible soft tissue collections. Prominent distension of the visualized urinary bladder. Disc levels: T12-L1: Seen only on  sagittal projection. Negative interspace. Epidural abscess at the posterior aspect of the thecal sac with resultant mild spinal stenosis. Foramina remain patent. L1-2: Disc bulge with disc desiccation and reactive endplate spurring. Mild facet hypertrophy. Epidural abscess involving the dorsal and lateral epidural space (series 5, image 5). Resultant mild-to-moderate spinal stenosis. Foramina remain patent. L2-3: Degenerative intervertebral disc space narrowing with diffuse disc bulge and disc desiccation. Mild reactive endplate spurring. Advanced bilateral facet arthrosis. Probable small volume epidural abscess posteriorly. Resultant severe canal with bilateral subarticular stenosis. Moderate right with mild left L2 foraminal narrowing. L3-4: Disc bulge with disc desiccation and reactive endplate spurring. Moderate bilateral facet hypertrophy. Epidural abscess posteriorly, slightly greater on the right (series 5, image 22). Resultant fairly severe spinal stenosis. Mild bilateral L3 foraminal narrowing. L4-5: Degenerative intervertebral disc space narrowing with diffuse disc bulge and disc desiccation. Discogenic reactive endplate change with marginal endplate osteophytic spurring. Probable superimposed left foraminal to extraforaminal disc protrusion (series 5, image 24). Moderate to advanced left greater than right facet arthrosis with joint effusion and marrow edema on the left, concerning for possible septic arthritis. Epidural abscess posteriorly (series 5, image 24). Resultant moderate to severe canal with severe left lateral recess stenosis. Severe left with mild to moderate right L4 foraminal narrowing. L5-S1: Disc bulge with disc desiccation. Superimposed central disc protrusion with annular fissure. Small volume epidural abscess at the left posterior epidural space (series 5, image 30). Mild to moderate facet hypertrophy. Resultant moderate narrowing of the left lateral recess. Central canal remains  patent. No significant foraminal encroachment. IMPRESSION: 1. Abnormal epidural collection extending from T12 through L4-5, concerning for epidural abscess given provided history. Resultant diffuse spinal stenosis throughout the lumbar spine, moderate to severe in nature at L2-3 through L4-5 as above. 2. Reactive marrow edema with joint effusion about the left L4-5 facet, concerning for possible septic arthritis, and possibly the source of the epidural collection. Associated diffuse edema throughout the left greater than right posterior paraspinous soft tissues. No  discrete soft tissue collection. 3. Underlying multilevel degenerative spondylosis and facet arthrosis as above, moderate to advanced at L2-3 and L4-5. 4. Prominent distension of the visualized urinary bladder. Electronically Signed   By: Jeannine Boga M.D.   On: 04/23/2021 02:38   IR Angiogram Extremity Right  Result Date: 04/14/2021 INDICATION: 76 year old male with a history of right great toe wound, diabetic foot wound, worsening, presents for angiogram and possible therapy EXAM: ULTRASOUND-GUIDED ACCESS RIGHT COMMON FEMORAL ARTERY RIGHT LOWER EXTREMITY ANGIOGRAM BALLOON ANGIOPLASTY OF RIGHT POSTERIOR TIBIAL ARTERY ANGIO-SEAL FOR HEMOSTASIS MEDICATIONS: 9000 units IV heparin.  650 mg p.o. aspirin ANESTHESIA/SEDATION: Moderate (conscious) sedation was employed during this procedure. A total of Versed 3.0 mg and Fentanyl 100 mcg was administered intravenously. Moderate Sedation Time: 116 minutes. The patient's level of consciousness and vital signs were monitored continuously by radiology nursing throughout the procedure under my direct supervision. CONTRAST:  46mL OMNIPAQUE IOHEXOL 300 MG/ML  SOLN FLUOROSCOPY TIME:  Fluoroscopy Time: 19 minutes 54 seconds COMPLICATIONS: None PROCEDURE: Informed consent was obtained from the patient following explanation of the procedure, risks, benefits and alternatives. The patient understands, agrees and  consents for the procedure. All questions were addressed. A time out was performed prior to the initiation of the procedure. Maximal barrier sterile technique utilized including caps, mask, sterile gowns, sterile gloves, large sterile drape, hand hygiene, and Betadine prep. Ultrasound survey of the right inguinal region was performed with images stored and sent to PACs, confirming patency of the right common femoral artery. 1% lidocaine was used for local anesthesia. Stab incision was made with 11 blade scalpel. Blunt dissection was performed under ultrasound guidance. A micropuncture needle was used access the common femoral artery under ultrasound. With excellent arterial blood flow returned, and an .018 micro wire was passed through the needle, observed enter the SFA under fluoroscopy. The needle was removed, and a micropuncture sheath was placed over the wire. The inner dilator and wire were removed, and an 035 Bentson wire was advanced under fluoroscopy into the SFA. The sheath was removed and a standard 5 [place] vascular sheath was placed. The dilator was removed and the sheath was flushed. Angiogram of the right lower extremity was performed with CO2, including the femoropopliteal segment, with then a placement a Kumpe the catheter into the distal SFA for continuation of the tibial and pedal vessels. IV heparinization was then performed. Bentson wire was placed in the distal SFA and the 5 French sheath was exchanged for a standard 6 French 45 cm straight tip sheath. Kumpe catheter was used to select the origin of the anterior tibial artery. 018 command wire was advanced into the anterior tibial artery through the first third. The Kumpe the catheter was removed and an 018 quick cross crossing catheter was passed on the command wire. Combination of the command wire and the crossing catheter were passed into the distal calf. Wire was removed and a gentle injection was performed demonstrating subintimal location.  We withdrew from the attempt at the anterior tibial artery revascularization and targeted the posterior tibial artery. The quick cross catheter and the command wire were used to select the origin of posterior tibial artery that was interpreted to be the origin. The command wire and the crossing catheter were passed into the mid segment when resistance was encountered. Wire was removed and a gentle injection was performed demonstrating a subintimal location. The catheter wire were then withdrawn into the sheath, and we elected to perform access of the posterior tibial artery.  Micro puncture was used to access the posterior tibial artery directed retrograde. A V18 wire was passed into the lumen of the posterior tibial artery, and was advanced easily through the posterior tibial artery into the popliteal artery. The needle was removed from the wire and then 018 crossing catheter was advanced on the wire through the posterior tibial artery, into the popliteal artery. A rendezvous was then performed with a angled Kumpe the catheter within the distal SFA, and the V18 wire was externalized. The Kumpe the catheter was removed. The 018 quick cross catheter was removed from the retrograde tibial access and then passed antegrade through the length of the femoropopliteal system and the tibial artery. Once the catheter was within the distal posterior tibial artery, the V18 wire was removed. 014 whisper wire was then advanced antegrade through the crossing catheter used to select the lateral plantar artery. The whisper wire was advanced through the pedal loop into the distal dorsalis pedis. 2.5 mm balloon angioplasty was then performed along the length of the posterior tibial artery, through the common plantar artery, and into the lateral plantar artery. 2 mm balloon was then advanced, for angioplasty through the lateral plantar artery, the pedal loop, and into the distal dorsalis pedis. Upon inflation of the 2 mm balloon within  the lateral plantar artery, a balloon rupture occurred with extravasation of contrast. 2.5 mm balloon was then inflated at low atmospheric pressure for 1 minute to achieve hemostasis. Balloon was then removed. Repeat angiogram was performed with CO2 and small volume contrast. Repeat 2 mm balloon angioplasty was then performed through the pedal loop. Final angiogram was performed through the lower extremity. All catheters and wires were removed. Six [place] Angio-Seal was deployed for hemostasis. Patient tolerated the procedure well and remained hemodynamically stable throughout. No complications were encountered and no significant blood loss. FINDINGS: Initial ultrasound demonstrates patent common femoral artery. Angiogram demonstrates patent femoropopliteal system with no recurrent high-grade stenosis. 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 Continuous tibioperoneal trunk and peroneal artery through its entire length to the ankle. There is communication to the calcaneal arteries posteriorly and to tarsal arteries anteriorly. Occluded posterior tibial artery on the ischial angiogram. After treatment the posterior tibial artery, there is in-line flow through the posterior tibial artery, common plantar artery, and the medial and lateral calcaneal artery to the forefoot. 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 IMPRESSION: Status post ultrasound guided access right common femoral artery and the right posterior tibial artery for treatment of occluded posterior tibial artery with standard balloon angioplasty, restoring in-line flow into the forefoot through the pedal loop. Angio-Seal for hemostasis. Signed, Dulcy Fanny. Dellia Nims, RPVI [facility] Electronically Signed   By: Corrie Mckusick D.O.   On: 04/14/2021 16:31   MR FOOT RIGHT WO CONTRAST  Result Date: 04/19/2021 CLINICAL DATA:  76 year old male with type 2  diabetes and peripheral vascular disease. Aorto-peripheral angiogram by Interventional Radiology 04/14/2021 with subsequent pain in legs. Evaluate for osteomyelitis. EXAM: MRI OF THE RIGHT FOREFOOT WITHOUT CONTRAST TECHNIQUE: Multiplanar, multisequence MR imaging of the right forefoot was performed. No intravenous contrast was administered. COMPARISON:  Right toes radiographs 04/18/2021 FINDINGS: Bones/Joint/Cartilage Patient motion artifact mildly to moderately technical degrades this examination. Within this limitation, there is diffuse decreased T1 and increased T2 signal throughout the distal phalanx of the great toe and the distal greater than proximal aspect of the proximal phalanx of the great toe. There is  thinning and ulceration of the medial great toe soft tissues, greatest at the interphalangeal joint and distal phalanx. There is diffuse but mild erosion of the medial cortex of the distal phalanx and the distal medial cortex of the proximal phalanx. Findings are highly concerning for acute osteomyelitis. Moderate degenerative changes of the great toe metatarsophalangeal joint. 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 Ligaments The visualized flexor and extensor tendons are intact. Muscles and Tendons Mild-to-moderate nonspecific myositis of the plantar and intrinsic musculature. Soft tissues No walled-off abscess is seen. IMPRESSION: Findings concerning for osteomyelitis of the distal phalanx greater than the proximal phalanx of the great toe, as described above. Electronically Signed   By: Yvonne Kendall   On: 04/19/2021 08:13   MR FOOT LEFT WO CONTRAST  Result Date: 04/22/2021 CLINICAL DATA:  Bacteremia.  History of osteomyelitis. EXAM: MRI OF THE LEFT FOOT WITHOUT CONTRAST TECHNIQUE: Multiplanar, multisequence MR imaging of the left foot was performed. No intravenous contrast was administered. COMPARISON:  Left foot x-rays dated  April 18, 2021. FINDINGS: Bones/Joint/Cartilage Prior first and second toe amputations. Confluent marrow edema with corresponding decreased T1 marrow signal involving the fourth distal phalanx. No acute fracture or dislocation. Chronic fracture deformity of the third proximal phalanx. No joint effusion. Ligaments Collateral ligaments are intact.  Lisfranc ligament is intact. Muscles and Tendons Postsurgical changes of the first and second flexor and extensor tendons. Flexor and extensor tendons are otherwise intact. No tenosynovitis. Increased T2 signal within and atrophy of the intrinsic muscles of the forefoot, nonspecific, but likely related to diabetic muscle changes. Soft tissue Soft tissue ulceration at the tip of the fourth toe extending to bone. No fluid collection. No soft tissue mass. IMPRESSION: 1. Soft tissue ulceration at the tip of the fourth toe extending to bone. Underlying osteomyelitis of the fourth distal phalanx. No abscess. Electronically Signed   By: Titus Dubin M.D.   On: 04/22/2021 21:53   IR TIB-PERO ART PTA MOD SED  Result Date: 04/14/2021 INDICATION: 76 year old male with a history of right great toe wound, diabetic foot wound, worsening, presents for angiogram and possible therapy EXAM: ULTRASOUND-GUIDED ACCESS RIGHT COMMON FEMORAL ARTERY RIGHT LOWER EXTREMITY ANGIOGRAM BALLOON ANGIOPLASTY OF RIGHT POSTERIOR TIBIAL ARTERY ANGIO-SEAL FOR HEMOSTASIS MEDICATIONS: 9000 units IV heparin.  650 mg p.o. aspirin ANESTHESIA/SEDATION: Moderate (conscious) sedation was employed during this procedure. A total of Versed 3.0 mg and Fentanyl 100 mcg was administered intravenously. Moderate Sedation Time: 116 minutes. The patient's level of consciousness and vital signs were monitored continuously by radiology nursing throughout the procedure under my direct supervision. CONTRAST:  29mL OMNIPAQUE IOHEXOL 300 MG/ML  SOLN FLUOROSCOPY TIME:  Fluoroscopy Time: 19 minutes 54 seconds COMPLICATIONS: None  PROCEDURE: Informed consent was obtained from the patient following explanation of the procedure, risks, benefits and alternatives. The patient understands, agrees and consents for the procedure. All questions were addressed. A time out was performed prior to the initiation of the procedure. Maximal barrier sterile technique utilized including caps, mask, sterile gowns, sterile gloves, large sterile drape, hand hygiene, and Betadine prep. Ultrasound survey of the right inguinal region was performed with images stored and sent to PACs, confirming patency of the right common femoral artery. 1% lidocaine was used for local anesthesia. Stab incision was made with 11 blade scalpel. Blunt dissection was performed under ultrasound guidance. A micropuncture needle was used access the common femoral artery under ultrasound. With excellent arterial blood flow returned, and an .018 micro wire  was passed through the needle, observed enter the SFA under fluoroscopy. The needle was removed, and a micropuncture sheath was placed over the wire. The inner dilator and wire were removed, and an 035 Bentson wire was advanced under fluoroscopy into the SFA. The sheath was removed and a standard 5 [place] vascular sheath was placed. The dilator was removed and the sheath was flushed. Angiogram of the right lower extremity was performed with CO2, including the femoropopliteal segment, with then a placement a Kumpe the catheter into the distal SFA for continuation of the tibial and pedal vessels. IV heparinization was then performed. Bentson wire was placed in the distal SFA and the 5 French sheath was exchanged for a standard 6 French 45 cm straight tip sheath. Kumpe catheter was used to select the origin of the anterior tibial artery. 018 command wire was advanced into the anterior tibial artery through the first third. The Kumpe the catheter was removed and an 018 quick cross crossing catheter was passed on the command wire. Combination  of the command wire and the crossing catheter were passed into the distal calf. Wire was removed and a gentle injection was performed demonstrating subintimal location. We withdrew from the attempt at the anterior tibial artery revascularization and targeted the posterior tibial artery. The quick cross catheter and the command wire were used to select the origin of posterior tibial artery that was interpreted to be the origin. The command wire and the crossing catheter were passed into the mid segment when resistance was encountered. Wire was removed and a gentle injection was performed demonstrating a subintimal location. The catheter wire were then withdrawn into the sheath, and we elected to perform access of the posterior tibial artery. Micro puncture was used to access the posterior tibial artery directed retrograde. A V18 wire was passed into the lumen of the posterior tibial artery, and was advanced easily through the posterior tibial artery into the popliteal artery. The needle was removed from the wire and then 018 crossing catheter was advanced on the wire through the posterior tibial artery, into the popliteal artery. A rendezvous was then performed with a angled Kumpe the catheter within the distal SFA, and the V18 wire was externalized. The Kumpe the catheter was removed. The 018 quick cross catheter was removed from the retrograde tibial access and then passed antegrade through the length of the femoropopliteal system and the tibial artery. Once the catheter was within the distal posterior tibial artery, the V18 wire was removed. 014 whisper wire was then advanced antegrade through the crossing catheter used to select the lateral plantar artery. The whisper wire was advanced through the pedal loop into the distal dorsalis pedis. 2.5 mm balloon angioplasty was then performed along the length of the posterior tibial artery, through the common plantar artery, and into the lateral plantar artery. 2 mm  balloon was then advanced, for angioplasty through the lateral plantar artery, the pedal loop, and into the distal dorsalis pedis. Upon inflation of the 2 mm balloon within the lateral plantar artery, a balloon rupture occurred with extravasation of contrast. 2.5 mm balloon was then inflated at low atmospheric pressure for 1 minute to achieve hemostasis. Balloon was then removed. Repeat angiogram was performed with CO2 and small volume contrast. Repeat 2 mm balloon angioplasty was then performed through the pedal loop. Final angiogram was performed through the lower extremity. All catheters and wires were removed. Six [place] Angio-Seal was deployed for hemostasis. Patient tolerated the procedure well and remained hemodynamically  stable throughout. No complications were encountered and no significant blood loss. FINDINGS: Initial ultrasound demonstrates patent common femoral artery. Angiogram demonstrates patent femoropopliteal system with no recurrent high-grade stenosis. 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 Continuous tibioperoneal trunk and peroneal artery through its entire length to the ankle. There is communication to the calcaneal arteries posteriorly and to tarsal arteries anteriorly. Occluded posterior tibial artery on the ischial angiogram. After treatment the posterior tibial artery, there is in-line flow through the posterior tibial artery, common plantar artery, and the medial and lateral calcaneal artery to the forefoot. 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 IMPRESSION: Status post ultrasound guided access right common femoral artery and the right posterior tibial artery for treatment of occluded posterior tibial artery with standard balloon angioplasty, restoring in-line flow into the forefoot through the pedal loop. Angio-Seal for hemostasis. Signed, Dulcy Fanny. Dellia Nims, RPVI [facility] Radiology  Electronically Signed   By: Corrie Mckusick D.O.   On: 04/14/2021 16:31   IR US Guide Vasc Access Right  Result Date: 04/14/2021 INDICATION: 76 year old male with a history of right great toe wound, diabetic foot wound, worsening, presents for angiogram and possible therapy EXAM: ULTRASOUND-GUIDED ACCESS RIGHT COMMON FEMORAL ARTERY RIGHT LOWER EXTREMITY ANGIOGRAM BALLOON ANGIOPLASTY OF RIGHT POSTERIOR TIBIAL ARTERY ANGIO-SEAL FOR HEMOSTASIS MEDICATIONS: 9000 units IV heparin.  650 mg p.o. aspirin ANESTHESIA/SEDATION: Moderate (conscious) sedation was employed during this procedure. A total of Versed 3.0 mg and Fentanyl 100 mcg was administered intravenously. Moderate Sedation Time: 116 minutes. The patient's level of consciousness and vital signs were monitored continuously by radiology nursing throughout the procedure under my direct supervision. CONTRAST:  53mL OMNIPAQUE IOHEXOL 300 MG/ML  SOLN FLUOROSCOPY TIME:  Fluoroscopy Time: 19 minutes 54 seconds COMPLICATIONS: None PROCEDURE: Informed consent was obtained from the patient following explanation of the procedure, risks, benefits and alternatives. The patient understands, agrees and consents for the procedure. All questions were addressed. A time out was performed prior to the initiation of the procedure. Maximal barrier sterile technique utilized including caps, mask, sterile gowns, sterile gloves, large sterile drape, hand hygiene, and Betadine prep. Ultrasound survey of the right inguinal region was performed with images stored and sent to PACs, confirming patency of the right common femoral artery. 1% lidocaine was used for local anesthesia. Stab incision was made with 11 blade scalpel. Blunt dissection was performed under ultrasound guidance. A micropuncture needle was used access the common femoral artery under ultrasound. With excellent arterial blood flow returned, and an .018 micro wire was passed through the needle, observed enter the SFA under  fluoroscopy. The needle was removed, and a micropuncture sheath was placed over the wire. The inner dilator and wire were removed, and an 035 Bentson wire was advanced under fluoroscopy into the SFA. The sheath was removed and a standard 5 [place] vascular sheath was placed. The dilator was removed and the sheath was flushed. Angiogram of the right lower extremity was performed with CO2, including the femoropopliteal segment, with then a placement a Kumpe the catheter into the distal SFA for continuation of the tibial and pedal vessels. IV heparinization was then performed. Bentson wire was placed in the distal SFA and the 5 French sheath was exchanged for a standard 6 French 45 cm straight tip sheath. Kumpe catheter was used to select the origin of the anterior tibial artery. 018 command wire was advanced into the anterior tibial artery through the first third. The Kumpe  the catheter was removed and an 018 quick cross crossing catheter was passed on the command wire. Combination of the command wire and the crossing catheter were passed into the distal calf. Wire was removed and a gentle injection was performed demonstrating subintimal location. We withdrew from the attempt at the anterior tibial artery revascularization and targeted the posterior tibial artery. The quick cross catheter and the command wire were used to select the origin of posterior tibial artery that was interpreted to be the origin. The command wire and the crossing catheter were passed into the mid segment when resistance was encountered. Wire was removed and a gentle injection was performed demonstrating a subintimal location. The catheter wire were then withdrawn into the sheath, and we elected to perform access of the posterior tibial artery. Micro puncture was used to access the posterior tibial artery directed retrograde. A V18 wire was passed into the lumen of the posterior tibial artery, and was advanced easily through the posterior tibial  artery into the popliteal artery. The needle was removed from the wire and then 018 crossing catheter was advanced on the wire through the posterior tibial artery, into the popliteal artery. A rendezvous was then performed with a angled Kumpe the catheter within the distal SFA, and the V18 wire was externalized. The Kumpe the catheter was removed. The 018 quick cross catheter was removed from the retrograde tibial access and then passed antegrade through the length of the femoropopliteal system and the tibial artery. Once the catheter was within the distal posterior tibial artery, the V18 wire was removed. 014 whisper wire was then advanced antegrade through the crossing catheter used to select the lateral plantar artery. The whisper wire was advanced through the pedal loop into the distal dorsalis pedis. 2.5 mm balloon angioplasty was then performed along the length of the posterior tibial artery, through the common plantar artery, and into the lateral plantar artery. 2 mm balloon was then advanced, for angioplasty through the lateral plantar artery, the pedal loop, and into the distal dorsalis pedis. Upon inflation of the 2 mm balloon within the lateral plantar artery, a balloon rupture occurred with extravasation of contrast. 2.5 mm balloon was then inflated at low atmospheric pressure for 1 minute to achieve hemostasis. Balloon was then removed. Repeat angiogram was performed with CO2 and small volume contrast. Repeat 2 mm balloon angioplasty was then performed through the pedal loop. Final angiogram was performed through the lower extremity. All catheters and wires were removed. Six [place] Angio-Seal was deployed for hemostasis. Patient tolerated the procedure well and remained hemodynamically stable throughout. No complications were encountered and no significant blood loss. FINDINGS: Initial ultrasound demonstrates patent common femoral artery. Angiogram demonstrates patent femoropopliteal system with no  recurrent high-grade stenosis. 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 Continuous tibioperoneal trunk and peroneal artery through its entire length to the ankle. There is communication to the calcaneal arteries posteriorly and to tarsal arteries anteriorly. Occluded posterior tibial artery on the ischial angiogram. After treatment the posterior tibial artery, there is in-line flow through the posterior tibial artery, common plantar artery, and the medial and lateral calcaneal artery to the forefoot. 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 IMPRESSION: Status post ultrasound guided access right common femoral artery and the right posterior tibial artery for treatment of occluded posterior tibial artery with standard balloon angioplasty, restoring in-line flow into the forefoot through the pedal loop. Angio-Seal for hemostasis. Signed, Dulcy Fanny. Zachary Newport DO, RPVI Vascular and  Interventional Radiology Specialists [facility] Radiology Electronically Signed   By: Corrie Mckusick D.O.   On: 04/14/2021 16:31   DG CHEST PORT 1 VIEW  Result Date: 04/19/2021 CLINICAL DATA:  Dyspnea EXAM: PORTABLE CHEST 1 VIEW COMPARISON:  04/18/2021 FINDINGS: The heart size and mediastinal contours are within normal limits. Both lungs are clear. The visualized skeletal structures are unremarkable. IMPRESSION: No active disease. Electronically Signed   By: Ulyses Jarred M.D.   On: 04/19/2021 02:30   DG Chest Port 1 View  Result Date: 04/18/2021 CLINICAL DATA:  Questionable sepsis - evaluate for abnormality Weakness, fever, shortness of breath, cough. EXAM: PORTABLE CHEST 1 VIEW COMPARISON:  Radiograph 11/27/2011 FINDINGS: Lung volumes are low. Mild cardiomegaly, likely accentuated by technique. Normal mediastinal contours with aortic atherosclerosis. Mild patchy opacity at the left lung base. No large pleural effusion. No pneumothorax. No pulmonary edema. No acute osseous abnormalities are  seen. IMPRESSION: 1. Patchy opacity at the left lung base, may represent atelectasis or pneumonia. 2. Mild cardiomegaly, likely accentuated by technique. Electronically Signed   By: Keith Rake M.D.   On: 04/18/2021 17:17   DG Foot 2 Views Right  Result Date: 04/20/2021 CLINICAL DATA:  Postop right foot, amputation of the first digit. EXAM: RIGHT FOOT - 2 VIEW COMPARISON:  MRI examination dated April 18, 2021 FINDINGS: Postsurgical changes for recent first dated amputation through the metatarsophalangeal joint. Subcutaneous soft tissue edema and surgical clips in the surgical bed. Prior amputation of the second digit through the metatarsophalangeal joint. Prominent plantar calcaneal spurring. Talonavicular and tibiotalar joint arthropathy with deformity of the talus and navicular bone. 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 Achilles enthesopathy. IMPRESSION: 1. Postsurgical changes of amputation through the first metatarsophalangeal joint. 2.  Additional chronic findings as detailed above. Electronically Signed   By: Keane Police D.O.   On: 04/20/2021 11:42   DG Foot Complete Left  Result Date: 04/18/2021 CLINICAL DATA:  Wound. EXAM: LEFT FOOT - COMPLETE 3+ VIEW COMPARISON:  No prior foot exams. FINDINGS: Prior resection of the first and second toes. There is a fracture of the third toe proximal phalanx of unknown acuity. Soft tissue defect subjacent to the metatarsal phalangeal joint joints, seen on the lateral view, tentatively visualized overlying the 1st-2nd intertarsal space. Subcortical cystic change about the distal metatarsal head but no frank erosions. Chronic degenerative change of the midfoot. Large plantar calcaneal spur and Achilles tendon enthesophyte. IMPRESSION: 1. Soft tissue defect subjacent to the metatarsophalangeal joints, tentatively visualized overlying the 1st-2nd intertarsal space, likely representing site of wound. 2. Prior resection of the first and second toes.  Subcortical cystic change of the first metatarsal head is of unknown acuity, favor chronic. No definite radiographic findings of osteomyelitis. 3. Fracture of the third toe proximal phalanx of unknown acuity. Electronically Signed   By: Keith Rake M.D.   On: 04/18/2021 17:24   DG Toe Great Right  Result Date: 04/18/2021 CLINICAL DATA:  Wound. EXAM: RIGHT GREAT TOE COMPARISON:  Foot radiograph 09/14/2019 FINDINGS: Decreased density the medial aspect of the great toe distal phalanx, with overlying thinning of the soft tissues, level pathologic fracture. There are small erosions about the medial aspect of the first metatarsal phalangeal joint. Osteoarthritis of the first metatarsal phalangeal joint. Diffuse soft tissue edema about the proximal digit. Prior resection of the second toe. No radiopaque foreign body. IMPRESSION: 1. 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 2. Small erosions about the medial aspect first  metatarsophalangeal joint. 3. Diffuse soft tissue edema about the proximal digit. Electronically Signed   By: Keith Rake M.D.   On: 04/18/2021 17:20   VAS [place] ABI WITH/WO TBI  Result Date: 04/19/2021  LOWER EXTREMITY DOPPLER STUDY Patient Name:  HELMUT HENNON  Date of Exam:   04/19/2021 Medical Rec #: [id number]        Accession #:    [id number] Date of Birth: September 11, 1945        Patient Gender: M Patient Age:   26 years Exam Location:  [facility] Procedure:      VAS [place] ABI WITH/WO TBI Referring Phys: Celesta Gentile --------------------------------------------------------------------------------  Indications: Peripheral artery disease. High Risk Factors: Hypertension, hyperlipidemia, Diabetes.  Vascular Interventions: Lt great toe amputation done 11/28/11                         Rt second toe amputation done 09/14/19. Limitations: Today's exam was limited due to right great toe wound. Performing Technologist: Archie Patten RVS   Examination Guidelines: A complete evaluation includes at minimum, Doppler waveform signals and systolic blood pressure reading at the level of bilateral brachial, anterior tibial, and posterior tibial arteries, when vessel segments are accessible. Bilateral testing is considered an integral part of a complete examination. Photoelectric Plethysmograph (PPG) waveforms and toe systolic pressure readings are included as required and additional duplex testing as needed. Limited examinations for reoccurring indications may be performed as noted.  ABI Findings: +---------+------------------+-----+----------+--------------------------------+  Right     Rt Pressure (mmHg) Index Waveform   Comment                           +---------+------------------+-----+----------+--------------------------------+  Brachial  129                      triphasic                                    +---------+------------------+-----+----------+--------------------------------+  PTA       144                1.12  monophasic                                   +---------+------------------+-----+----------+--------------------------------+  DP        255                1.98  monophasic                                   +---------+------------------+-----+----------+--------------------------------+  Great Toe                                     unable to obtain pressure due to                                                 toe wound                         +---------+------------------+-----+----------+--------------------------------+ +---------+------------------+-----+-------------------+--------------+  Left      Lt Pressure (mmHg) Index Waveform            Comment         +---------+------------------+-----+-------------------+--------------+  Brachial  124                      triphasic                           +---------+------------------+-----+-------------------+--------------+  PTA       83                 0.64  dampened  monophasic                 +---------+------------------+-----+-------------------+--------------+  DP        255                1.98  monophasic                          +---------+------------------+-----+-------------------+--------------+  Great Toe                                              toe amputation  +---------+------------------+-----+-------------------+--------------+ +-------+-----------+-----------+------------+------------+  ABI/TBI Today's ABI Today's TBI Previous ABI Previous TBI  +-------+-----------+-----------+------------+------------+  Right   1.97                                               +-------+-----------+-----------+------------+------------+  Left    1.97                                               +-------+-----------+-----------+------------+------------+  Summary: Right: Resting right ankle-brachial index indicates noncompressible right lower extremity arteries. Left: Resting left ankle-brachial index indicates noncompressible left lower extremity arteries.  *See table(s) above for measurements and observations.  Electronically signed by Servando Snare MD on 04/19/2021 at 5:54:17 PM.    Final    ECHOCARDIOGRAM COMPLETE  Result Date: 04/20/2021    ECHOCARDIOGRAM REPORT   Patient Name:   Zachary [name] Date of Exam: 04/20/2021 Medical Rec #:  [id number]       Height:       68.0 in Accession #:    [id number]      Weight:       190.0 lb Date of Birth:  1946-01-11       BSA:          2.000 m Patient Age:    1 years        BP:           115/75 mmHg Patient Gender: M               HR:           88 bpm. Exam Location:  Inpatient Procedure: 2D Echo, Cardiac Doppler, Color Doppler and Intracardiac            Opacification Agent Indications:    Bacteremia R78.81  History:        Patient has no prior history of Echocardiogram examinations.  Post toe removal today.  Sonographer:    Merrie Roof RDCS Referring Phys: [id number] [place]  1. Left ventricular  ejection fraction, by estimation, is 40 to 45%. The left ventricle has mildly decreased function. The left ventricle demonstrates regional wall motion abnormalities (see scoring diagram/findings for description). Left ventricular diastolic parameters are indeterminate.  2. Right ventricular systolic function is normal. The right ventricular size is normal. Tricuspid regurgitation signal is inadequate for assessing PA pressure.  3. Left atrial size was mildly dilated.  4. Right atrial size was mildly dilated.  5. The mitral valve is degenerative. No evidence of mitral valve regurgitation. Mild to moderate mitral stenosis. The mean mitral valve gradient is 6.0 mmHg with average heart rate of 88 bpm. Moderate to severe mitral annular calcification.  6. The aortic valve is tricuspid. Aortic valve regurgitation is not visualized. Moderate to severe aortic valve stenosis. Aortic valve mean gradient measures 28.0 mmHg. Aortic valve Vmax measures 3.82 m/s. Conclusion(s)/Recommendation(s): No hemodynamically significant valvular heart disease. FINDINGS  Left Ventricle: Left ventricular ejection fraction, by estimation, is 40 to 45%. The left ventricle has mildly decreased function. The left ventricle demonstrates regional wall motion abnormalities. Definity contrast agent was given IV to delineate the left ventricular endocardial borders. The left ventricular internal cavity size was normal in size. There is no left ventricular hypertrophy. Left ventricular diastolic function could not be evaluated due to mitral stenosis. Left ventricular diastolic parameters are indeterminate. Right Ventricle: The right ventricular size is normal. No increase in right ventricular wall thickness. Right ventricular systolic function is normal. Tricuspid regurgitation signal is inadequate for assessing PA pressure. Left Atrium: Left atrial size was mildly dilated. Right Atrium: Right atrial size was mildly dilated. Pericardium: There is no  evidence of pericardial effusion. Mitral Valve: The mitral valve is degenerative in appearance. Moderate to severe mitral annular calcification. No evidence of mitral valve regurgitation. Mild to moderate mitral valve stenosis. MV peak gradient, 19.0 mmHg. The mean mitral valve gradient is 6.0 mmHg with average heart rate of 88 bpm. Tricuspid Valve: The tricuspid valve is normal in structure. Tricuspid valve regurgitation is not demonstrated. Aortic Valve: The aortic valve is tricuspid. Aortic valve regurgitation is not visualized. Moderate to severe aortic stenosis is present. Aortic valve mean gradient measures 28.0 mmHg. Aortic valve peak gradient measures 58.4 mmHg. Aortic valve area, by VTI measures 0.80 cm. Pulmonic Valve: The pulmonic valve was not well visualized. Pulmonic valve regurgitation is not visualized. Aorta: The aortic root is normal in size and structure. Venous: The inferior vena cava was not well visualized. IAS/Shunts: The interatrial septum was not well visualized.  LEFT VENTRICLE PLAX 2D LVIDd:         4.00 cm   Diastology LVIDs:         2.70 cm   LV e' medial:  5.22 cm/s LV PW:         1.00 cm   LV e' lateral: 7.18 cm/s LV IVS:        0.80 cm LVOT diam:     1.96 cm LV SV:         52 LV SV Index:   26 LVOT Area:     3.02 cm  RIGHT VENTRICLE RV Basal diam:  4.20 cm RV Mid diam:    4.70 cm LEFT ATRIUM              Index        RIGHT ATRIUM  Index LA diam:        4.10 cm  2.05 cm/m   RA Area:     21.00 cm LA Vol (A2C):   107.0 ml 53.51 ml/m  RA Volume:   59.50 ml  29.75 ml/m LA Vol (A4C):   71.9 ml  35.96 ml/m LA Biplane Vol: 95.6 ml  47.81 ml/m  AORTIC VALVE AV Area (Vmax):    0.79 cm AV Area (Vmean):   0.80 cm AV Area (VTI):     0.80 cm AV Vmax:           382.00 cm/s AV Vmean:          246.000 cm/s AV VTI:            0.656 m AV Peak Grad:      58.4 mmHg AV Mean Grad:      28.0 mmHg LVOT Vmax:         100.00 cm/s LVOT Vmean:        65.600 cm/s LVOT VTI:          0.174 m  LVOT/AV VTI ratio: 0.27  AORTA Ao Root diam: 2.70 cm MITRAL VALVE MV Area (PHT): 1.29 cm    SHUNTS MV Peak grad:  19.0 mmHg   Systemic VTI:  0.17 m MV Mean grad:  6.0 mmHg    Systemic Diam: 1.96 cm MV Vmax:       2.18 m/s MV Vmean:      115.0 cm/s MV Decel Time: 587 msec Mihai Croitoru MD Electronically signed by Zachary Fair MD Signature Date/Time: 04/20/2021/7:31:14 PM    Final    [place] RT LOWER EXTREM LTD SOFT TISSUE NON VASCULAR  Result Date: 04/22/2021 CLINICAL DATA:  Pain and swelling right inguinal region EXAM: ULTRASOUND right inguinal LOWER EXTREMITY LIMITED TECHNIQUE: Ultrasound examination of the lower extremity soft tissues was performed in the area of clinical concern. COMPARISON:  None. FINDINGS: There are no abnormal fluid collections in the right inguinal region. 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 IMPRESSION: There are no abnormal fluid collections in the right inguinal region. There is lymph node with fatty hilum suggesting possible benign reactive hyperplasia. Electronically Signed   By: Ernie Avena M.D.   On: 04/22/2021 11:22   ECHO TEE  Result Date: 04/26/2021    TRANSESOPHOGEAL ECHO REPORT   Patient Name:   Zachary [name] Date of Exam: 04/26/2021 Medical Rec #:  [id number]       Height:       68.0 in Accession #:    [id number]      Weight:       185.6 lb Date of Birth:  11-04-1945       BSA:          1.980 m Patient Age:    75 years        BP:           116/52 mmHg Patient Gender: M               HR:           74 bpm. Exam Location:  Inpatient Procedure: Cardiac Doppler, Color Doppler, Transesophageal Echo, 3D Echo and            Saline Contrast Bubble Study Indications:    Bacteremia  History:        Patient has prior history of Echocardiogram examinations, most  recent 04/20/2021. Aortic Valve Disease and Mitral Valve Disease.  Sonographer:    Clayton Lefort RDCS (AE)  Referring Phys: [id number] [place]: After discussion of the risks and benefits of a TEE, an informed consent was obtained from the patient. TEE procedure time was 16 minutes. The transesophogeal probe was passed without difficulty through the esophogus of the patient. Imaged were obtained with the patient in a left lateral decubitus position. Sedation performed by different physician. The patient was monitored while under deep sedation. Anesthestetic sedation was provided intravenously by Anesthesiology: 165mg  of Propofol. Image quality was excellent. The patient's vital signs; including heart rate, blood pressure, and oxygen saturation; remained stable throughout the procedure. The patient developed no complications during the procedure. IMPRESSIONS  1. Left ventricular ejection fraction, by estimation, is 60 to 65%. The left ventricle has normal function.  2. Right ventricular systolic function is normal. The right ventricular size is normal.  3. Left atrial size was mildly dilated. No left atrial/left atrial appendage thrombus was detected. The LAA emptying velocity was 50 cm/s.  4. Right atrial size was mildly dilated.  5. Moderate calcific mitral stenosis is present. MG 7 mmHG @ 80 bpm. MVA by VTI 1.28 cm2, DI 0.45. MVA by direct 3D MPR 1.85 cm2. The mitral valve is degenerative. Mild mitral valve regurgitation. Moderate mitral stenosis. Moderate to severe mitral annular calcification.  6. At least moderate AS is present. Gradients lower than TTE, likley due to anesthesia. The aortic valve is tricuspid. Aortic valve regurgitation is mild. Moderate aortic valve stenosis. Aortic valve area, by VTI measures 1.24 cm. Aortic valve mean gradient measures 14.0 mmHg. Aortic valve Vmax measures 2.47 m/s.  7. 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  8. Agitated saline contrast bubble study was negative, with no evidence of any interatrial shunt.  Conclusion(s)/Recommendation(s): No evidence of vegetation/infective endocarditis on this transesophageael echocardiogram. FINDINGS  Left Ventricle: Left ventricular ejection fraction, by estimation, is 60 to 65%. The left ventricle has normal function. The left ventricular internal cavity size was normal in size. Right Ventricle: The right ventricular size is normal. No increase in right ventricular wall thickness. Right ventricular systolic function is normal. Left Atrium: Left atrial size was mildly dilated. No left atrial/left atrial appendage thrombus was detected. The LAA emptying velocity was 50 cm/s. Right Atrium: Right atrial size was mildly dilated. Pericardium: There is no evidence of pericardial effusion. Mitral Valve: Moderate calcific mitral stenosis is present. MG 7 mmHG @ 80 bpm. MVA by VTI 1.28 cm2, DI 0.45. MVA by direct 3D MPR 1.85 cm2. The mitral valve is degenerative in appearance. Moderate to severe mitral annular calcification. Mild mitral valve regurgitation. Moderate mitral valve stenosis. MV peak gradient, 17.0 mmHg. The mean mitral valve gradient is 7.0 mmHg with average heart rate of 80 bpm. There is no evidence of mitral valve vegetation. Tricuspid Valve: The tricuspid valve is normal in structure. Tricuspid valve regurgitation is trivial. No evidence of tricuspid stenosis. There is no evidence of tricuspid valve vegetation. Aortic Valve: At least moderate AS is present. Gradients lower than TTE, likley due to anesthesia. The aortic valve is tricuspid. Aortic valve regurgitation is mild. Aortic regurgitation PHT measures 600 msec. Moderate aortic stenosis is present. Aortic valve mean gradient measures 14.0 mmHg. Aortic valve peak gradient measures 24.4 mmHg. Aortic valve area, by VTI measures 1.24 cm. There is no evidence of aortic valve vegetation. Pulmonic Valve: The pulmonic valve was normal in structure. Pulmonic valve  regurgitation is trivial. No evidence of pulmonic stenosis.  There is no evidence of pulmonic valve vegetation. Aorta: The aortic root and ascending aorta are structurally normal, with no evidence of dilitation. There is moderate (Grade III) layered plaque involving the descending aorta. Venous: The left lower pulmonary vein, left upper pulmonary vein, right upper pulmonary vein and right lower pulmonary vein are normal. IAS/Shunts: The atrial septum is grossly normal. Agitated saline contrast was given intravenously to evaluate for intracardiac shunting. Agitated saline contrast bubble study was negative, with no evidence of any interatrial shunt.  LEFT VENTRICLE PLAX 2D LVOT diam:     1.90 cm LV SV:         57 LV SV Index:   29 LVOT Area:     2.84 cm  AORTIC VALVE AV Area (Vmax):    1.10 cm AV Area (Vmean):   1.13 cm AV Area (VTI):     1.24 cm AV Vmax:           247.00 cm/s AV Vmean:          173.000 cm/s AV VTI:            0.460 m AV Peak Grad:      24.4 mmHg AV Mean Grad:      14.0 mmHg LVOT Vmax:         96.20 cm/s LVOT Vmean:        69.200 cm/s LVOT VTI:          0.201 m LVOT/AV VTI ratio: 0.44 AI PHT:            600 msec  AORTA Ao Root diam: 2.78 cm MITRAL VALVE MV Area VTI:  1.28 cm    SHUNTS MV Peak grad: 17.0 mmHg   Systemic VTI:  0.20 m MV Mean grad: 7.0 mmHg    Systemic Diam: 1.90 cm MV Vmax:      2.06 m/s MV Vmean:     121.0 cm/s MV VTI:       0.44 m Zachary Chiquito MD Electronically signed by Zachary Chiquito MD Signature Date/Time: 04/26/2021/2:54:05 PM    Final    [place] EKG SITE RITE  Result Date: 04/22/2021 If Site Rite image not attached, placement could not be confirmed due to current cardiac rhythm.   Labs:  CBC: Recent Labs    04/20/21 0640 04/21/21 0215 04/26/21 0422 05/14/21 0744  WBC 15.4* 12.8* 9.1 7.6  HGB 12.8* 12.7* 13.4 12.6*  HCT 36.6* 36.8* 40.2 38.7*  PLT 243 269 334 232    COAGS: Recent Labs    03/03/21 0711 04/14/21 0724 04/18/21 1635 05/14/21 0744  INR 1.0 1.0 1.1 1.0  APTT  --   --  34  --     BMP: Recent Labs     04/22/21 1236 04/26/21 0422 04/28/21 0408 05/14/21 0744  NA 132* 136 136 138  K 3.6 4.2 3.9 4.0  CL 97* 99 98 102  CO2 $Re'26 30 31 27  'lgx$ GLUCOSE 235* 153* 109* 124*  BUN 24* $Remov'16 16 13  'iktxon$ CALCIUM 7.8* 8.3* 8.4* 8.7*  CREATININE 1.41* 1.38* 1.40* 1.36*  GFRNONAA 51* 53* 52* 54*    LIVER FUNCTION TESTS: Recent Labs    04/18/21 1635 04/20/21 0640 04/21/21 0215 04/22/21 1236  BILITOT 1.0 0.4 0.6 0.3  AST 38 $Remo'22 19 19  'QqcYH$ ALT 39 $Remo'29 17 9  'czfNX$ ALKPHOS 105 78 70 67  PROT 6.7 5.3* 5.3* 5.3*  ALBUMIN 2.5* 1.9* 1.9* 1.9*    TUMOR  MARKERS: No results for input(s): AFPTM, CEA, CA199, CHROMGRNA in the last 8760 hours.  Assessment and Plan:  76 y/o M with history of PAD s/p treatment of RLE critical limb ischemia 03/03/21 and 04/14/21 who presents today for LLE angiogram with possible intervention for critical limb ischemia.  Risks and benefits of right lower extremity arteriogram with intervention were discussed with the patient including, but not limited to bleeding, infection, vascular injury, contrast induced renal failure, stroke, reperfusion hemorrhage, or even death.  This interventional procedure involves the use of X-rays and because of the nature of the planned procedure, it is possible that we will have prolonged use of X-ray fluoroscopy. Potential radiation risks to you include (but are not limited to) the following: - A slightly elevated risk for cancer  several years later in life. This risk is typically less than 0.5% percent. This risk is low in comparison to the normal incidence of human cancer, which is 33% for women and 50% for men according to the [employer]. - Radiation induced injury can include skin redness, resembling a rash, tissue breakdown / ulcers and hair loss (which can be temporary or permanent).  The likelihood of either of these occurring depends on the difficulty of the procedure and whether you are sensitive to radiation due to previous procedures,  disease, or genetic conditions.  IF your procedure requires a prolonged use of radiation, you will be notified and given written instructions for further action.  It is your responsibility to monitor the irradiated area for the 2 weeks following the procedure and to notify your physician if you are concerned that you have suffered a radiation induced injury.    All of the patient's questions were answered, patient is agreeable to proceed.  Consent signed and in chart.  Thank you for this interesting consult.  I greatly enjoyed meeting RODRIQUEZ THORNER and look forward to participating in their care.  A copy of this report was sent to the requesting provider on this date.  Electronically Signed: Joaquim Nam, PA-C 05/14/2021, 8:24 AM   I spent a total of  25 Minutes in face to face in clinical consultation, greater than 50% of which was counseling/coordinating care for LLE angiogram with possible intervention.

## 2021-05-14 NOTE — Procedures (Signed)
Interventional Radiology Procedure Note  Procedure:   US guided right CFA access Left lower extremity angiogram Treatment of multi-focal disease of the left SFA, >20cm, with single short occlusion at the adductor canal = laser atherectomy, DEB 4 & 101mm, supera stents 78mm x 165mm & 153mm distal SFA Angioseal for closure  EBL: ~016YW   Complications: None  Recommendations:  - 4 hours right hip straight - advance diet - IV NS hydration x 4 hours - single dose Vit C in recovery - routine wound care - continue all medication on schedule - Do not submerge for 7 days - continue wound care - Follow up with Dr. [name] in 3-4 weeks in clinic  Signed,  Dulcy Fanny. [name], DO

## 2021-05-14 NOTE — Sedation Documentation (Addendum)
Angioseal deployed by Dr [name] at 1120.

## 2021-05-18 ENCOUNTER — Other Ambulatory Visit: Payer: Self-pay

## 2021-05-18 ENCOUNTER — Ambulatory Visit (INDEPENDENT_AMBULATORY_CARE_PROVIDER_SITE_OTHER): Payer: Medicare PPO | Admitting: Internal Medicine

## 2021-05-18 VITALS — HR 95 | Temp 98.4°F

## 2021-05-18 DIAGNOSIS — G062 Extradural and subdural abscess, unspecified: Secondary | ICD-10-CM | POA: Diagnosis not present

## 2021-05-18 NOTE — Patient Instructions (Signed)
05/28/2021 10:15 AM Posey Pronto Thomasene Lot, [name] [facility]

## 2021-05-18 NOTE — Progress Notes (Signed)
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 coli UTI 04/21/2021   MSSA bacteremia 04/19/2021   Osteomyelitis of great toe of right foot ([place]) 04/18/2021   sepsis secondary to pneumonia vs. 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   Headache 03/24/2020   Hyperlipidemia, unspecified 03/24/2020   Hyperlipidemia 03/24/2020   Long term (current) use of insulin ([place]) 03/24/2020   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 ([place]) 03/24/2020   Stage 3 chronic kidney disease ([place]) 03/24/2020   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 ([place]) 61/60/7371   Gangrenous toe ([place])    Acute osteomyelitis ([place]) 09/13/2019   Quadriceps tendon rupture, left, initial encounter 01/10/2019   Closed comminuted fracture of left patella, initial encounter 01/08/2019     Subjective:  76 year old male with 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 Initially clinical presentation thought to be 2/2 pneumonia. Found to have MSSA bacteremia and started on cefazolin. Found to have right great toe osteomyelitis (MR showed osteomyelitis of the distal phalanx of the great toe) SP amputation of right great  toe on 1/10 with Cx+ Ecoli and klebsiella oxytoca. Cefazolin transitioned to cefepime. Hospital course complicated by back pain. MR showed epidural abscess extending from T12-14/L5. Neurosurgery consulted and recommended antibiotics and re-imaging. Pt developed hand jerking. Neurology engaged and cefepime held, antibiotics changed to cefazolin and ciprofloxacin. Mri of left foot showed 4th phalanx osteo, podiatry recommended vascular evaluation to intervention.  Today: Pt is feeling well. He reports back pain is minimal .  Review of Systems  All other systems reviewed and are negative.  Past Medical History:  Diagnosis Date   Diabetes mellitus    Hypercholesteremia    Hypertension    Patella fracture    left   PVD (peripheral vascular disease) ([place])     Outpatient Medications Prior to Visit  Medication Sig Dispense Refill   acetaminophen (TYLENOL) 500 MG tablet Take 1,000 mg by mouth See admin instructions. Take 1000 mg 3 times daily, may take 1000 mg every 8 hours as needed for mild/moderate pain     aspirin EC 81 MG tablet Take 81 mg by mouth daily.     B-D ULTRAFINE III SHORT PEN 31G X 8 MM MISC Inject into the skin 5 (five) times daily.     calcium carbonate (TUMS - DOSED IN MG ELEMENTAL CALCIUM) 500 MG chewable tablet Chew 1,000 mg by mouth daily as needed for indigestion or heartburn.     ceFAZolin (ANCEF) IVPB Inject 2 g into the vein every 8 (eight) hours. Indication: MSSA bacteremia/osteomyelitis  First Dose: Yes Last Day of Therapy:  06/14/2021 Labs - Once weekly:  CBC/D and BMP, Labs - Every other week:  ESR and CRP Method of administration: IV Push  Method of administration may be changed at the discretion of home infusion pharmacist based upon assessment of the patient and/or caregiver's ability to self-administer the medication ordered. 144 Units 0   ciprofloxacin (CIPRO) 500 MG tablet Take 1 tablet (500 mg total) by mouth 2 (two) times daily.     clopidogrel (PLAVIX) 75 MG  tablet Take 75 mg by mouth daily.     diltiazem (CARDIZEM CD) 120 MG 24 hr capsule Take 120 mg by mouth daily.     famotidine (PEPCID) 20 MG tablet Take 20 mg by mouth every evening.     glucose blood (FREESTYLE LITE) test strip TEST 3 TIMES A DAY E11.9     insulin aspart (NOVOLOG) 100 UNIT/ML injection Inject 3 Units into the skin 3 (three) times daily with meals. 10 mL 11   insulin glargine (LANTUS SOLOSTAR) 100 UNIT/ML Solostar Pen Inject 24 Units into the skin at bedtime.     lactulose (CHRONULAC) 10 GM/15ML solution Take 30 mLs (20 g total) by mouth 2 (two) times daily as needed for mild constipation or moderate constipation. 236 mL 0   Lancets (FREESTYLE) lancets AS DIRECTED THREE TIMES A DAY     NON FORMULARY Apply 1 application topically 2 (two) times daily. Skin Prep, apply to bilateral heels     pantoprazole (PROTONIX) 40 MG tablet Take 40 mg by mouth daily.     polyethylene glycol (MIRALAX / GLYCOLAX) 17 g packet Take 17 g by mouth 2 (two) times daily. 14 each 0   Povidone-Iodine (BETADINE EX) Apply 1 application topically 2 (two) times daily. To left foot and left foot 4th digit     senna-docusate (SENOKOT-S) 8.6-50 MG tablet Take 2 tablets by mouth 2 (two) times daily.     No facility-administered medications prior to visit.     Allergies  Allergen Reactions   Metformin Hcl Other (See Comments)    Unknown reaction   Pravastatin Sodium Other (See Comments)    Muscle pain     Social History   Tobacco Use   Smoking status: Never   Smokeless tobacco: Never  Substance Use Topics   Alcohol use: No   Drug use: No    No family history on file.  Objective:  There were no vitals filed for this visit. There is no height or weight on file to calculate BMI.  Physical Exam Constitutional:      General: He is not in acute distress.    Appearance: He is normal weight. He is not toxic-appearing.  HENT:     Head: Normocephalic and atraumatic.     Right Ear: External ear  normal.     Left Ear: External ear normal.     Nose: No congestion or rhinorrhea.     Mouth/Throat:     Mouth: Mucous membranes are moist.     Pharynx: Oropharynx is clear.  Eyes:     Extraocular Movements: Extraocular movements intact.     Conjunctiva/sclera: Conjunctivae normal.     Pupils: Pupils are equal, round, and reactive to light.  Cardiovascular:     Rate and Rhythm: Normal rate and regular rhythm.     Heart sounds: No murmur heard.   No friction rub. No gallop.  Pulmonary:     Effort: Pulmonary effort is normal.     Breath sounds: Normal breath sounds.  Abdominal:     General: Abdomen is flat. Bowel sounds are normal.     Palpations: Abdomen is soft.  Musculoskeletal:  General: No swelling. Normal range of motion.     Cervical back: Normal range of motion and neck supple.     Comments: Left 4th phalanx with 2 cm necrosis at tip RLE wound C/D/I  Skin:    General: Skin is warm and dry.  Neurological:     General: No focal deficit present.     Mental Status: He is oriented to person, place, and time.  Psychiatric:        Mood and Affect: Mood normal.    Lab Results: Lab Results  Component Value Date   WBC 7.6 05/14/2021   HGB 12.6 (L) 05/14/2021   HCT 38.7 (L) 05/14/2021   MCV 87.2 05/14/2021   PLT 232 05/14/2021    Lab Results  Component Value Date   CREATININE 1.36 (H) 05/14/2021   BUN 13 05/14/2021   NA 138 05/14/2021   K 4.0 05/14/2021   CL 102 05/14/2021   CO2 27 05/14/2021    Lab Results  Component Value Date   ALT 9 04/22/2021   AST 19 04/22/2021   ALKPHOS 67 04/22/2021   BILITOT 0.3 04/22/2021     Assessment & Plan:  #MSSA bacteremia #Right great toe osteomyelitis SP amputation of right great toe on 1/10 with Cx+ Ecoli and klebsiella oxytoca #Left 4th distal phalanx #T12-L4/L5 epidural abscess - Back pain is minimal, no issues with worsening ambulation/incontinence.  -Pt is currently at [facility] Plan -Continue antibiotics cefazolin  and ciprofloxacin to complete 8 weeks EOT 06/14/21 -Follow-up in 1 month with ID -Pt plan on follow-up with podiatry per left foot osteo on 05/28/21. He is SP LLE angiogram with stent distal SFA.  Laurice Record, MD [place] [facility]   05/18/21  8:48 AM

## 2021-05-19 ENCOUNTER — Telehealth: Payer: Self-pay | Admitting: Pharmacist

## 2021-05-19 NOTE — Telephone Encounter (Signed)
OPAT labs faxed from [facility]. Placed in Dr. Keturah Barre box.   Ruth Tully L. Kenadie Royce, PharmD [facility] Clinical Pharmacist Practitioner

## 2021-05-28 ENCOUNTER — Ambulatory Visit (INDEPENDENT_AMBULATORY_CARE_PROVIDER_SITE_OTHER): Payer: Medicare PPO | Admitting: Podiatry

## 2021-05-28 ENCOUNTER — Other Ambulatory Visit: Payer: Self-pay

## 2021-05-28 DIAGNOSIS — I96 Gangrene, not elsewhere classified: Secondary | ICD-10-CM

## 2021-05-28 DIAGNOSIS — I739 Peripheral vascular disease, unspecified: Secondary | ICD-10-CM | POA: Diagnosis not present

## 2021-05-28 DIAGNOSIS — Z89419 Acquired absence of unspecified great toe: Secondary | ICD-10-CM

## 2021-05-31 ENCOUNTER — Other Ambulatory Visit: Payer: Self-pay | Admitting: Interventional Radiology

## 2021-05-31 DIAGNOSIS — I739 Peripheral vascular disease, unspecified: Secondary | ICD-10-CM

## 2021-06-02 NOTE — Progress Notes (Signed)
Subjective:  Patient ID: Zachary [name], male    DOB: [birth year],  MRN: [id number]  Chief Complaint  Patient presents with   Wound Check    Wound check    Follow-up    DOS: 04/20/2021 Procedure: Right hallux amputation  76 y.o. male returns for post-op check.  Patient states he is doing well.  The right hallux looks a lot better.  He states that the third toe is doing about the same.  No drainage no nausea fever chills vomiting.  No pain.  He would like to think about taking it and third toe off  Review of Systems: Negative except as noted in the HPI. Denies N/V/F/Ch.  Past Medical History:  Diagnosis Date   Diabetes mellitus    Hypercholesteremia    Hypertension    Patella fracture    left   PVD (peripheral vascular disease) ([place])     Current Outpatient Medications:    acetaminophen (TYLENOL) 500 MG tablet, Take 1,000 mg by mouth See admin instructions. Take 1000 mg 3 times daily, may take 1000 mg every 8 hours as needed for mild/moderate pain, Disp: , Rfl:    aspirin EC 81 MG tablet, Take 81 mg by mouth daily., Disp: , Rfl:    B-D ULTRAFINE III SHORT PEN 31G X 8 MM MISC, Inject into the skin 5 (five) times daily., Disp: , Rfl:    calcium carbonate (TUMS - DOSED IN MG ELEMENTAL CALCIUM) 500 MG chewable tablet, Chew 1,000 mg by mouth daily as needed for indigestion or heartburn., Disp: , Rfl:    ceFAZolin (ANCEF) IVPB, Inject 2 g into the vein every 8 (eight) hours. Indication: MSSA bacteremia/osteomyelitis  First Dose: Yes Last Day of Therapy:  06/14/2021 Labs - Once weekly:  CBC/D and BMP, Labs - Every other week:  ESR and CRP Method of administration: IV Push Method of administration may be changed at the discretion of home infusion pharmacist based upon assessment of the patient and/or caregiver's ability to self-administer the medication ordered., Disp: 144 Units, Rfl: 0   ciprofloxacin (CIPRO) 500 MG tablet, Take 1 tablet (500 mg total) by mouth 2 (two) times daily., Disp: ,  Rfl:    clopidogrel (PLAVIX) 75 MG tablet, Take 75 mg by mouth daily., Disp: , Rfl:    diltiazem (CARDIZEM CD) 120 MG 24 hr capsule, Take 120 mg by mouth daily., Disp: , Rfl:    famotidine (PEPCID) 20 MG tablet, Take 20 mg by mouth every evening., Disp: , Rfl:    glucose blood (FREESTYLE LITE) test strip, TEST 3 TIMES A DAY E11.9, Disp: , Rfl:    insulin aspart (NOVOLOG) 100 UNIT/ML injection, Inject 3 Units into the skin 3 (three) times daily with meals., Disp: 10 mL, Rfl: 11   insulin glargine (LANTUS SOLOSTAR) 100 UNIT/ML Solostar Pen, Inject 24 Units into the skin at bedtime., Disp: , Rfl:    lactulose (CHRONULAC) 10 GM/15ML solution, Take 30 mLs (20 g total) by mouth 2 (two) times daily as needed for mild constipation or moderate constipation., Disp: 236 mL, Rfl: 0   Lancets (FREESTYLE) lancets, AS DIRECTED THREE TIMES A DAY, Disp: , Rfl:    NON FORMULARY, Apply 1 application topically 2 (two) times daily. Skin Prep, apply to bilateral heels, Disp: , Rfl:    pantoprazole (PROTONIX) 40 MG tablet, Take 40 mg by mouth daily., Disp: , Rfl:    polyethylene glycol (MIRALAX / GLYCOLAX) 17 g packet, Take 17 g by mouth 2 (two) times daily.,  Disp: 14 each, Rfl: 0   Povidone-Iodine (BETADINE EX), Apply 1 application topically 2 (two) times daily. To left foot and left foot 4th digit, Disp: , Rfl:    senna-docusate (SENOKOT-S) 8.6-50 MG tablet, Take 2 tablets by mouth 2 (two) times daily., Disp: , Rfl:   Social History   Tobacco Use  Smoking Status Never  Smokeless Tobacco Never    Allergies  Allergen Reactions   Metformin Hcl Other (See Comments)    Unknown reaction   Pravastatin Sodium Other (See Comments)    Muscle pain    Objective:  There were no vitals filed for this visit. There is no height or weight on file to calculate BMI. Constitutional Well developed. Well nourished.  Vascular Foot warm and well perfused. Capillary refill normal to all digits.   Neurologic Normal  speech. Oriented to person, place, and time. Epicritic sensation to light touch grossly present bilaterally.  Dermatologic Right hallux completely reepithelialized.  Left third digit gangrene noted with osteomyelitis of the distal phalanx.  No purulent drainage noted no redness noted.  Orthopedic: None tenderness to palpation noted about the surgical site.   Radiographs: None Assessment:   1. PAD (peripheral artery disease) ([place])   2. History of amputation of great toe ([place])   3. Gangrene of toe of left foot ([place])     Plan:  Patient was evaluated and treated and all questions answered.  S/p foot surgery right -Clinically healed and officially discharged from my care.   Left third digit distal tip gangrene superficial/dry -All questions and concerns were discussed with the patient in extensive detail. -Patient just had an angioplasty done to the left side with improvement of flow. -Continue Betadine wet-to-dry dressing -Continue offloading with surgical shoe -Patient may need surgical amputation of the third digit as it has not improved much.  We will continue to clinically keep an eye out on it.  I have asked him to continue doing Betadine wet-to-dry dressing changes.  No follow-ups on file.

## 2021-06-15 ENCOUNTER — Other Ambulatory Visit: Payer: Self-pay

## 2021-06-15 ENCOUNTER — Ambulatory Visit (INDEPENDENT_AMBULATORY_CARE_PROVIDER_SITE_OTHER): Payer: Medicare PPO | Admitting: Internal Medicine

## 2021-06-15 ENCOUNTER — Encounter: Payer: Self-pay | Admitting: Internal Medicine

## 2021-06-15 VITALS — BP 138/82 | HR 95 | Temp 98.0°F | Ht 68.0 in | Wt 191.0 lb

## 2021-06-15 DIAGNOSIS — M868X9 Other osteomyelitis, unspecified sites: Secondary | ICD-10-CM | POA: Diagnosis not present

## 2021-06-15 NOTE — Progress Notes (Signed)
Patient Active Problem List   Diagnosis Date Noted   Myoclonic jerking- right hand 04/26/2021   Constipation 04/25/2021   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 coli UTI 04/21/2021   MSSA bacteremia 04/19/2021   Osteomyelitis of great toe of right foot ([place]) 04/18/2021   sepsis secondary to pneumonia vs. osteomyelitis of rigth great toe  04/18/2021   Hyponatremia 04/18/2021   Diabetic peripheral neuropathy associated with type 2 diabetes mellitus ([place]) 01/08/2021   Peripheral vascular disease ([place]) 01/08/2021   Body mass index (BMI) 30.0-30.9, adult 03/24/2020   Carpal tunnel syndrome 03/24/2020   Colonic polyp 03/24/2020   Diabetic renal disease ([place]) 03/24/2020   Elevated PSA 03/24/2020   Essential hypertension 03/24/2020   Familial hypercholesterolemia 03/24/2020   Headache 03/24/2020   Hyperlipidemia, unspecified 03/24/2020   Hyperlipidemia 03/24/2020   Long term (current) use of insulin ([place]) 03/24/2020   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 ([place]) 03/24/2020   Stage 3 chronic kidney disease ([place]) 03/24/2020   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 ([place]) 03/30/7587   Gangrenous toe ([place])    Acute osteomyelitis ([place]) 09/13/2019   Quadriceps tendon rupture, left, initial encounter 01/10/2019   Closed comminuted fracture of left patella, initial encounter 01/08/2019    Patient's Medications  New Prescriptions   No medications on file  Previous Medications   ACETAMINOPHEN (TYLENOL) 500 MG TABLET    Take 1,000 mg by mouth See admin instructions. Take 1000 mg 3 times daily, may take 1000 mg every 8 hours as needed for mild/moderate pain   ASPIRIN EC 81 MG TABLET    Take 81 mg by mouth daily.   B-D ULTRAFINE III SHORT PEN 31G X 8 MM MISC    Inject into the skin 5 (five) times daily.   CALCIUM CARBONATE (TUMS - DOSED IN MG ELEMENTAL CALCIUM) 500 MG CHEWABLE TABLET    Chew 1,000 mg by mouth daily as needed for  indigestion or heartburn.   CEFAZOLIN (ANCEF) IVPB    Inject 2 g into the vein every 8 (eight) hours. Indication: MSSA bacteremia/osteomyelitis  First Dose: Yes Last Day of Therapy:  06/14/2021 Labs - Once weekly:  CBC/D and BMP, Labs - Every other week:  ESR and CRP Method of administration: IV Push Method of administration may be changed at the discretion of home infusion pharmacist based upon assessment of the patient and/or caregiver's ability to self-administer the medication ordered.   CIPROFLOXACIN (CIPRO) 500 MG TABLET    Take 1 tablet (500 mg total) by mouth 2 (two) times daily.   CLOPIDOGREL (PLAVIX) 75 MG TABLET    Take 75 mg by mouth daily.   DILTIAZEM (CARDIZEM CD) 120 MG 24 HR CAPSULE    Take 120 mg by mouth daily.   FAMOTIDINE (PEPCID) 20 MG TABLET    Take 20 mg by mouth every evening.   GLUCOSE BLOOD (FREESTYLE LITE) TEST STRIP    TEST 3 TIMES A DAY E11.9   INSULIN ASPART (NOVOLOG) 100 UNIT/ML INJECTION    Inject 3 Units into the skin 3 (three) times daily with meals.   INSULIN GLARGINE (LANTUS SOLOSTAR) 100 UNIT/ML SOLOSTAR PEN    Inject 24 Units into the skin at bedtime.   LACTULOSE (CHRONULAC) 10 GM/15ML SOLUTION    Take 30 mLs (20 g total) by mouth 2 (two) times daily as needed for mild constipation or moderate constipation.   LANCETS (FREESTYLE) LANCETS  AS DIRECTED THREE TIMES A DAY   NON FORMULARY    Apply 1 application topically 2 (two) times daily. Skin Prep, apply to bilateral heels   PANTOPRAZOLE (PROTONIX) 40 MG TABLET    Take 40 mg by mouth daily.   POLYETHYLENE GLYCOL (MIRALAX / GLYCOLAX) 17 G PACKET    Take 17 g by mouth 2 (two) times daily.   POVIDONE-IODINE (BETADINE EX)    Apply 1 application topically 2 (two) times daily. To left foot and left foot 4th digit   SENNA-DOCUSATE (SENOKOT-S) 8.6-50 MG TABLET    Take 2 tablets by mouth 2 (two) times daily.  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 Initially clinical presentation thought to be 2/2 pneumonia. Found to have MSSA bacteremia and started on cefazolin. Found to have right great toe osteomyelitis (MR showed osteomyelitis of the distal phalanx of the great toe) SP amputation of right great toe on 1/10 with Cx+ Ecoli and klebsiella oxytoca. Cefazolin transitioned to cefepime. Hospital course complicated by back pain. MR showed epidural abscess extending from T12-14/L5. Neurosurgery consulted and recommended antibiotics and re-imaging. Pt developed hand jerking. Neurology engaged and cefepime held, antibiotics changed to cefazolin and ciprofloxacin. Mri of left foot showed 4th phalanx osteo, podiatry recommended vascular evaluation to intervention. Angioplasty of LLE on 2/3.  2/7: Pt is feeling well. He reports back pain is minimal . Interval: Seen by podiatry Dr. Posey Pronto on 2/17. Today: Son accompanied pt to visit. Pt reports feeling well. PICC in place.  Review of Systems   Review of Systems: Review of Systems  All other systems reviewed and are negative.  Past Medical History:  Diagnosis Date   Diabetes mellitus    Hypercholesteremia    Hypertension    Patella fracture    left   PVD (peripheral vascular disease) ([facility])     Social History   Tobacco Use   Smoking status: Never   Smokeless tobacco: Never  Substance Use Topics   Alcohol use: No   Drug use: No    No family history on file.  Allergies  Allergen Reactions   Metformin Hcl Other (See Comments)    Unknown reaction   Pravastatin Sodium Other (See Comments)    Muscle pain     Health Maintenance  Topic Date Due   COVID-19 Vaccine (1) Never done   FOOT EXAM  Never done   OPHTHALMOLOGY EXAM  Never done   URINE MICROALBUMIN  Never done    Hepatitis C Screening  Never done   Zoster Vaccines- Shingrix (1 of 2) Never done   Pneumonia Vaccine 100+ Years old (1 - PCV) Never done   TETANUS/TDAP  04/17/2018   INFLUENZA VACCINE  11/09/2020   HEMOGLOBIN A1C  10/18/2021   HPV VACCINES  Aged Out    Objective:  There were no vitals filed for this visit. There is no height or weight on file to calculate BMI.  Physical Exam Constitutional:      General: He is not in acute distress.    Appearance: He is normal weight. He is not toxic-appearing.  HENT:     Head: Normocephalic and atraumatic.     Right Ear: External ear normal.     Left Ear: External ear normal.  Nose: No congestion or rhinorrhea.     Mouth/Throat:     Mouth: Mucous membranes are moist.     Pharynx: Oropharynx is clear.  Eyes:     Extraocular Movements: Extraocular movements intact.     Conjunctiva/sclera: Conjunctivae normal.     Pupils: Pupils are equal, round, and reactive to light.  Cardiovascular:     Rate and Rhythm: Normal rate and regular rhythm.     Heart sounds: No murmur heard.   No friction rub. No gallop.  Pulmonary:     Effort: Pulmonary effort is normal.     Breath sounds: Normal breath sounds.  Abdominal:     General: Abdomen is flat. Bowel sounds are normal.     Palpations: Abdomen is soft.  Musculoskeletal:        General: No swelling. Normal range of motion.     Cervical back: Normal range of motion and neck supple.  Skin:    General: Skin is warm and dry.     Comments: Right foot wounds with overlying eschars(2). Left 4th toe with necrosis on tip  Neurological:     General: No focal deficit present.     Mental Status: He is oriented to person, place, and time.  Psychiatric:        Mood and Affect: Mood normal.    Lab Results Lab Results  Component Value Date   WBC 7.6 05/14/2021   HGB 12.6 (L) 05/14/2021   HCT 38.7 (L) 05/14/2021   MCV 87.2 05/14/2021   PLT 232 05/14/2021    Lab Results  Component Value Date    CREATININE 1.36 (H) 05/14/2021   BUN 13 05/14/2021   NA 138 05/14/2021   K 4.0 05/14/2021   CL 102 05/14/2021   CO2 27 05/14/2021    Lab Results  Component Value Date   ALT 9 04/22/2021   AST 19 04/22/2021   ALKPHOS 67 04/22/2021   BILITOT 0.3 04/22/2021    No results found for: CHOL, HDL, LDLCALC, LDLDIRECT, TRIG, CHOLHDL No results found for: LABRPR, RPRTITER No results found for: HIV1RNAQUANT, HIV1RNAVL, CD4TABS   Problem List Items Addressed This Visit   None #MSSA bacteremia #Right great toe osteomyelitis SP amputation of right great toe on 1/10 with Cx+ Ecoli and klebsiella oxytoca #Left 4th distal phalanx #T12-L4/L5 epidural abscess - Back pain resolved  -Pt is currently at SNF, plan to discharge today. labs 3/3 scr 1.70, wbc 6.3, esr 37, crp 0.53 Plan -Completed  cefazolin and ciprofloxacin to complete 8 weeks EOT 06/14/21 -Pull picc -MRI left foot, Suspect pt will need amputation of 4th digit -follow-up 3 weeks  Laurice Record, [facility] 06/15/2021, 7:14 AM

## 2021-06-16 ENCOUNTER — Ambulatory Visit: Payer: Medicare PPO | Admitting: Podiatry

## 2021-06-16 DIAGNOSIS — Z89419 Acquired absence of unspecified great toe: Secondary | ICD-10-CM

## 2021-06-16 DIAGNOSIS — I739 Peripheral vascular disease, unspecified: Secondary | ICD-10-CM | POA: Diagnosis not present

## 2021-06-16 DIAGNOSIS — M86672 Other chronic osteomyelitis, left ankle and foot: Secondary | ICD-10-CM | POA: Diagnosis not present

## 2021-06-17 NOTE — Progress Notes (Signed)
?Subjective:  ?Patient ID: Zachary [name], male    DOB: 07-01-1945,  MRN: [id number] ? ?Chief Complaint  ?Patient presents with  ? Wound Check  ? ? ?DOS: 04/20/2021 ?Procedure: Right hallux amputation ? ?76 y.o. male returns for post-op check.  Patient states he is doing well.  The right hallux looks a lot better.  He states his left fourth toe is doing okay.  He states that it does not hurt however it does appear to be regressing.  They have been doing Betadine wet-to-dry dressing.  He is lost integrity of the hallux as well as the second digit.  He would like more of a definitive plan with the next amputation.  They also have appointment scheduled with Dr. Corrie Mckusick. ? ?Review of Systems: Negative except as noted in the HPI. Denies N/V/F/Ch. ? ?Past Medical History:  ?Diagnosis Date  ? Diabetes mellitus   ? Hypercholesteremia   ? Hypertension   ? Patella fracture   ? left  ? PVD (peripheral vascular disease) ([place])   ? ? ?Current Outpatient Medications:  ?  acetaminophen (TYLENOL) 500 MG tablet, Take 1,000 mg by mouth See admin instructions. Take 1000 mg 3 times daily, may take 1000 mg every 8 hours as needed for mild/moderate pain, Disp: , Rfl:  ?  aspirin EC 81 MG tablet, Take 81 mg by mouth daily., Disp: , Rfl:  ?  B-D ULTRAFINE III SHORT PEN 31G X 8 MM MISC, Inject into the skin 5 (five) times daily., Disp: , Rfl:  ?  calcium carbonate (TUMS - DOSED IN MG ELEMENTAL CALCIUM) 500 MG chewable tablet, Chew 1,000 mg by mouth daily as needed for indigestion or heartburn., Disp: , Rfl:  ?  ciprofloxacin (CIPRO) 500 MG tablet, Take 1 tablet (500 mg total) by mouth 2 (two) times daily. (Patient not taking: Reported on 06/15/2021), Disp: , Rfl:  ?  clopidogrel (PLAVIX) 75 MG tablet, Take 75 mg by mouth daily., Disp: , Rfl:  ?  diltiazem (CARDIZEM CD) 120 MG 24 hr capsule, Take 120 mg by mouth daily. (Patient not taking: Reported on 06/15/2021), Disp: , Rfl:  ?  diltiazem (CARDIZEM LA) 180 MG 24 hr tablet, Take 180 mg by  mouth daily., Disp: , Rfl:  ?  famotidine (PEPCID) 20 MG tablet, Take 20 mg by mouth every evening., Disp: , Rfl:  ?  furosemide (LASIX) 80 MG tablet, Take 80 mg by mouth daily., Disp: , Rfl:  ?  glucose blood (FREESTYLE LITE) test strip, TEST 3 TIMES A DAY E11.9, Disp: , Rfl:  ?  insulin aspart (NOVOLOG) 100 UNIT/ML injection, Inject 3 Units into the skin 3 (three) times daily with meals., Disp: 10 mL, Rfl: 11 ?  insulin glargine (LANTUS SOLOSTAR) 100 UNIT/ML Solostar Pen, Inject 24 Units into the skin at bedtime., Disp: , Rfl:  ?  lactulose (CHRONULAC) 10 GM/15ML solution, Take 30 mLs (20 g total) by mouth 2 (two) times daily as needed for mild constipation or moderate constipation., Disp: 236 mL, Rfl: 0 ?  Lancets (FREESTYLE) lancets, AS DIRECTED THREE TIMES A DAY, Disp: , Rfl:  ?  nitroGLYCERIN (NITROSTAT) 0.4 MG SL tablet, Place 0.4 mg under the tongue every 5 (five) minutes as needed for chest pain., Disp: , Rfl:  ?  NON FORMULARY, Apply 1 application topically 2 (two) times daily. Skin Prep, apply to bilateral heels, Disp: , Rfl:  ?  pantoprazole (PROTONIX) 40 MG tablet, Take 40 mg by mouth daily., Disp: , Rfl:  ?  polyethylene glycol (MIRALAX / GLYCOLAX) 17 g packet, Take 17 g by mouth 2 (two) times daily., Disp: 14 each, Rfl: 0 ?  POTASSIUM CHLORIDE ER PO, Take 20 mEq by mouth 2 times daily at 12 noon and 4 pm., Disp: , Rfl:  ?  Povidone-Iodine (BETADINE EX), Apply 1 application topically 2 (two) times daily. To left foot and left foot 4th digit, Disp: , Rfl:  ?  senna-docusate (SENOKOT-S) 8.6-50 MG tablet, Take 2 tablets by mouth 2 (two) times daily., Disp: , Rfl:  ? ?Social History  ? ?Tobacco Use  ?Smoking Status Never  ?Smokeless Tobacco Never  ? ? ?Allergies  ?Allergen Reactions  ? Metformin Hcl Other (See Comments)  ?  Unknown reaction  ? Pravastatin Sodium Other (See Comments)  ?  Muscle pain   ? ?Objective:  ?There were no vitals filed for this visit. ?There is no height or weight on file to  calculate BMI. ?Constitutional Well developed. ?Well nourished.  ?Vascular Foot warm and well perfused. ?Capillary refill normal to all digits.   ?Neurologic Normal speech. ?Oriented to person, place, and time. ?Epicritic sensation to light touch grossly present bilaterally.  ?Dermatologic Right hallux completely reepithelialized.  Left fourth digit gangrene noted with osteomyelitis of the distal phalanx.  No purulent drainage noted no redness noted.  ?Orthopedic: None tenderness to palpation noted about the surgical site.  ? ?Radiographs: None ?Assessment:  ? ?1. PAD (peripheral artery disease) ([place])   ?2. History of amputation of great toe ([place])   ?3. Chronic osteomyelitis of toe, left ([place])   ? ?IMPRESSION: ?1. Soft tissue ulceration at the tip of the fourth toe extending to ?bone. Underlying osteomyelitis of the fourth distal phalanx. No ?abscess. ?  ? ? ?Plan:  ?Patient was evaluated and treated and all questions answered. ? ?S/p foot surgery right ?-Clinically healed and officially discharged from my care. ? ? ?Left fourth digit distal tip gangrene superficial/dry with a history/loss of first and second digit. ?-All questions and concerns were discussed with the patient in extensive detail. ?-Patient has had angiogram done with improvement of the flow to the left side however the distal tip is regressing.  I am worried that given that patient has lost the first second digit and now will be the fourth digit involvement concerning for bone infection I believe patient will benefit from transmetatarsal amputation at this time.  I discussed this with the patient and they would like to take care of it all at once if possible.  I discussed with the patient given that he is losing the integrity of 3 digits the fourth and fifth digit will no longer be functional will end up causing a sore and ulceration leading to amputation.  I discussed that I am happy to proceed with just doing the fourth digit amputation however  after discussing risk and complication I believe patient will benefit from transmetatarsal amputation to give him ambulatory status as well as hopefully prevent ulceration.  He is developing callus formation and superficial ulceration to the left submetatarsal 1 as well.  Given these findings I recommended transmetatarsal amputation.  Patient states understanding would like to proceed with transmetatarsal amputation on the left side ?-However prior to undergoing the amputation I would want the best vascular flow possible.  He will reach out to Dr. Corrie Mckusick to see if there is anything else that can be done to optimize the flow as this will be a much bigger amputation. ?-We will plan on undergoing transmetatarsal  amputation in 6 weeks.  I discussed with him that if something regresses or if there is redness and concern for infection go to the emergency room we can do it as an inpatient.  Patient agrees with the plan. ? ? ?No follow-ups on file.  ?

## 2021-06-18 ENCOUNTER — Inpatient Hospital Stay (HOSPITAL_COMMUNITY)
Admission: EM | Admit: 2021-06-18 | Discharge: 2021-06-29 | DRG: 246 | Disposition: A | Discharge status: Home Health | Payer: Medicare PPO | Attending: Internal Medicine | Admitting: Internal Medicine

## 2021-06-18 ENCOUNTER — Encounter (HOSPITAL_COMMUNITY): Payer: Self-pay

## 2021-06-18 ENCOUNTER — Ambulatory Visit: Payer: Medicare PPO | Admitting: Podiatry

## 2021-06-18 ENCOUNTER — Emergency Department (HOSPITAL_COMMUNITY): Payer: Medicare PPO

## 2021-06-18 ENCOUNTER — Other Ambulatory Visit: Payer: Self-pay

## 2021-06-18 DIAGNOSIS — I509 Heart failure, unspecified: Secondary | ICD-10-CM | POA: Diagnosis not present

## 2021-06-18 DIAGNOSIS — Z7982 Long term (current) use of aspirin: Secondary | ICD-10-CM

## 2021-06-18 DIAGNOSIS — I739 Peripheral vascular disease, unspecified: Secondary | ICD-10-CM | POA: Diagnosis not present

## 2021-06-18 DIAGNOSIS — I350 Nonrheumatic aortic (valve) stenosis: Secondary | ICD-10-CM | POA: Diagnosis not present

## 2021-06-18 DIAGNOSIS — Z79899 Other long term (current) drug therapy: Secondary | ICD-10-CM

## 2021-06-18 DIAGNOSIS — I5020 Unspecified systolic (congestive) heart failure: Secondary | ICD-10-CM | POA: Diagnosis not present

## 2021-06-18 DIAGNOSIS — E1122 Type 2 diabetes mellitus with diabetic chronic kidney disease: Secondary | ICD-10-CM | POA: Diagnosis present

## 2021-06-18 DIAGNOSIS — I342 Nonrheumatic mitral (valve) stenosis: Secondary | ICD-10-CM

## 2021-06-18 DIAGNOSIS — I130 Hypertensive heart and chronic kidney disease with heart failure and stage 1 through stage 4 chronic kidney disease, or unspecified chronic kidney disease: Principal | ICD-10-CM | POA: Diagnosis present

## 2021-06-18 DIAGNOSIS — Z8619 Personal history of other infectious and parasitic diseases: Secondary | ICD-10-CM

## 2021-06-18 DIAGNOSIS — Z7902 Long term (current) use of antithrombotics/antiplatelets: Secondary | ICD-10-CM

## 2021-06-18 DIAGNOSIS — I5021 Acute systolic (congestive) heart failure: Secondary | ICD-10-CM | POA: Diagnosis not present

## 2021-06-18 DIAGNOSIS — I700 Atherosclerosis of aorta: Secondary | ICD-10-CM | POA: Diagnosis present

## 2021-06-18 DIAGNOSIS — E7800 Pure hypercholesterolemia, unspecified: Secondary | ICD-10-CM | POA: Diagnosis present

## 2021-06-18 DIAGNOSIS — I25118 Atherosclerotic heart disease of native coronary artery with other forms of angina pectoris: Secondary | ICD-10-CM | POA: Diagnosis present

## 2021-06-18 DIAGNOSIS — I080 Rheumatic disorders of both mitral and aortic valves: Secondary | ICD-10-CM | POA: Diagnosis present

## 2021-06-18 DIAGNOSIS — E871 Hypo-osmolality and hyponatremia: Secondary | ICD-10-CM | POA: Diagnosis present

## 2021-06-18 DIAGNOSIS — J9601 Acute respiratory failure with hypoxia: Secondary | ICD-10-CM | POA: Diagnosis present

## 2021-06-18 DIAGNOSIS — E1151 Type 2 diabetes mellitus with diabetic peripheral angiopathy without gangrene: Secondary | ICD-10-CM | POA: Diagnosis present

## 2021-06-18 DIAGNOSIS — I255 Ischemic cardiomyopathy: Secondary | ICD-10-CM | POA: Diagnosis present

## 2021-06-18 DIAGNOSIS — J918 Pleural effusion in other conditions classified elsewhere: Secondary | ICD-10-CM | POA: Diagnosis present

## 2021-06-18 DIAGNOSIS — M8660 Other chronic osteomyelitis, unspecified site: Secondary | ICD-10-CM | POA: Diagnosis not present

## 2021-06-18 DIAGNOSIS — R7881 Bacteremia: Secondary | ICD-10-CM | POA: Diagnosis present

## 2021-06-18 DIAGNOSIS — N179 Acute kidney failure, unspecified: Secondary | ICD-10-CM

## 2021-06-18 DIAGNOSIS — E1165 Type 2 diabetes mellitus with hyperglycemia: Secondary | ICD-10-CM | POA: Diagnosis present

## 2021-06-18 DIAGNOSIS — M86171 Other acute osteomyelitis, right ankle and foot: Secondary | ICD-10-CM | POA: Diagnosis present

## 2021-06-18 DIAGNOSIS — N1831 Chronic kidney disease, stage 3a: Secondary | ICD-10-CM | POA: Diagnosis present

## 2021-06-18 DIAGNOSIS — S91302A Unspecified open wound, left foot, initial encounter: Secondary | ICD-10-CM | POA: Diagnosis present

## 2021-06-18 DIAGNOSIS — R0602 Shortness of breath: Secondary | ICD-10-CM | POA: Diagnosis present

## 2021-06-18 DIAGNOSIS — M868X9 Other osteomyelitis, unspecified sites: Secondary | ICD-10-CM

## 2021-06-18 DIAGNOSIS — G062 Extradural and subdural abscess, unspecified: Secondary | ICD-10-CM | POA: Diagnosis not present

## 2021-06-18 DIAGNOSIS — E118 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: Secondary | ICD-10-CM | POA: Diagnosis not present

## 2021-06-18 DIAGNOSIS — R0600 Dyspnea, unspecified: Secondary | ICD-10-CM

## 2021-06-18 DIAGNOSIS — R778 Other specified abnormalities of plasma proteins: Secondary | ICD-10-CM | POA: Diagnosis present

## 2021-06-18 DIAGNOSIS — I878 Other specified disorders of veins: Secondary | ICD-10-CM | POA: Diagnosis present

## 2021-06-18 DIAGNOSIS — E870 Hyperosmolality and hypernatremia: Secondary | ICD-10-CM | POA: Diagnosis not present

## 2021-06-18 DIAGNOSIS — Z20822 Contact with and (suspected) exposure to covid-19: Secondary | ICD-10-CM | POA: Diagnosis present

## 2021-06-18 DIAGNOSIS — E1169 Type 2 diabetes mellitus with other specified complication: Secondary | ICD-10-CM | POA: Diagnosis present

## 2021-06-18 DIAGNOSIS — I10 Essential (primary) hypertension: Secondary | ICD-10-CM | POA: Diagnosis not present

## 2021-06-18 DIAGNOSIS — I050 Rheumatic mitral stenosis: Secondary | ICD-10-CM

## 2021-06-18 DIAGNOSIS — I5023 Acute on chronic systolic (congestive) heart failure: Secondary | ICD-10-CM | POA: Diagnosis present

## 2021-06-18 DIAGNOSIS — S91301A Unspecified open wound, right foot, initial encounter: Secondary | ICD-10-CM | POA: Diagnosis present

## 2021-06-18 DIAGNOSIS — Z794 Long term (current) use of insulin: Secondary | ICD-10-CM

## 2021-06-18 DIAGNOSIS — Z89411 Acquired absence of right great toe: Secondary | ICD-10-CM

## 2021-06-18 DIAGNOSIS — I351 Nonrheumatic aortic (valve) insufficiency: Secondary | ICD-10-CM | POA: Diagnosis not present

## 2021-06-18 DIAGNOSIS — R079 Chest pain, unspecified: Secondary | ICD-10-CM

## 2021-06-18 DIAGNOSIS — I340 Nonrheumatic mitral (valve) insufficiency: Secondary | ICD-10-CM | POA: Diagnosis not present

## 2021-06-18 DIAGNOSIS — Z888 Allergy status to other drugs, medicaments and biological substances status: Secondary | ICD-10-CM

## 2021-06-18 DIAGNOSIS — I2510 Atherosclerotic heart disease of native coronary artery without angina pectoris: Secondary | ICD-10-CM | POA: Diagnosis not present

## 2021-06-18 DIAGNOSIS — Z955 Presence of coronary angioplasty implant and graft: Secondary | ICD-10-CM

## 2021-06-18 DIAGNOSIS — J90 Pleural effusion, not elsewhere classified: Secondary | ICD-10-CM

## 2021-06-18 HISTORY — DX: Acute kidney failure, unspecified: N17.9

## 2021-06-18 LAB — CBC WITH DIFFERENTIAL/PLATELET
Abs Immature Granulocytes: 0.05 10*3/uL (ref 0.00–0.07)
Basophils Absolute: 0.1 10*3/uL (ref 0.0–0.1)
Basophils Relative: 1 %
Eosinophils Absolute: 0.1 10*3/uL (ref 0.0–0.5)
Eosinophils Relative: 1 %
HCT: 38.5 % — ABNORMAL LOW (ref 39.0–52.0)
Hemoglobin: 12.9 g/dL — ABNORMAL LOW (ref 13.0–17.0)
Immature Granulocytes: 1 %
Lymphocytes Relative: 7 %
Lymphs Abs: 0.7 10*3/uL (ref 0.7–4.0)
MCH: 29.1 pg (ref 26.0–34.0)
MCHC: 33.5 g/dL (ref 30.0–36.0)
MCV: 86.9 fL (ref 80.0–100.0)
Monocytes Absolute: 0.7 10*3/uL (ref 0.1–1.0)
Monocytes Relative: 7 %
Neutro Abs: 8.4 10*3/uL — ABNORMAL HIGH (ref 1.7–7.7)
Neutrophils Relative %: 83 %
Platelets: 192 10*3/uL (ref 150–400)
RBC: 4.43 MIL/uL (ref 4.22–5.81)
RDW: 14.2 % (ref 11.5–15.5)
WBC: 9.9 10*3/uL (ref 4.0–10.5)
nRBC: 0 % (ref 0.0–0.2)

## 2021-06-18 LAB — RESP PANEL BY RT-PCR (FLU A&B, COVID) ARPGX2
Influenza A by PCR: NEGATIVE
Influenza B by PCR: NEGATIVE
SARS Coronavirus 2 by RT PCR: NEGATIVE

## 2021-06-18 LAB — TROPONIN I (HIGH SENSITIVITY)
Troponin I (High Sensitivity): 45 ng/L — ABNORMAL HIGH (ref <18)
Troponin I (High Sensitivity): 33 ng/L — ABNORMAL HIGH (ref <18)

## 2021-06-18 LAB — BASIC METABOLIC PANEL
Anion gap: 12 (ref 5–15)
BUN: 26 mg/dL — ABNORMAL HIGH (ref 8–23)
CO2: 27 mmol/L (ref 22–32)
Calcium: 8.5 mg/dL — ABNORMAL LOW (ref 8.9–10.3)
Chloride: 90 mmol/L — ABNORMAL LOW (ref 98–111)
Creatinine, Ser: 1.56 mg/dL — ABNORMAL HIGH (ref 0.61–1.24)
GFR, Estimated: 45 mL/min — ABNORMAL LOW (ref >60)
Glucose, Bld: 109 mg/dL — ABNORMAL HIGH (ref 70–99)
Potassium: 4.1 mmol/L (ref 3.5–5.1)
Sodium: 129 mmol/L — ABNORMAL LOW (ref 135–145)

## 2021-06-18 LAB — BRAIN NATRIURETIC PEPTIDE: B Natriuretic Peptide: 2098.7 pg/mL — ABNORMAL HIGH (ref 0.0–100.0)

## 2021-06-18 MED ORDER — ENOXAPARIN SODIUM 40 MG/0.4ML IJ SOSY
40.0000 mg | PREFILLED_SYRINGE | INTRAMUSCULAR | Status: DC
  Administered 2021-06-18 – 2021-06-21 (×4): 40 mg via SUBCUTANEOUS
  Filled 2021-06-18 (×4): qty 0.4

## 2021-06-18 MED ORDER — FUROSEMIDE 10 MG/ML IJ SOLN
40.0000 mg | Freq: Every day | INTRAMUSCULAR | Status: DC
  Administered 2021-06-19: 40 mg via INTRAVENOUS
  Filled 2021-06-18: qty 4

## 2021-06-18 MED ORDER — FUROSEMIDE 10 MG/ML IJ SOLN
40.0000 mg | Freq: Once | INTRAMUSCULAR | Status: AC
  Administered 2021-06-18: 40 mg via INTRAVENOUS
  Filled 2021-06-18: qty 4

## 2021-06-18 NOTE — ED Provider Notes (Cosign Needed)
[place] EMERGENCY DEPARTMENT Provider Note   CSN: [id number] Arrival date & time: 06/18/21  1456     History  Chief Complaint  Patient presents with   Shortness of Breath    Zachary [name] is a 76 y.o. male with a medical hx of DM, HTN, PVD, who presents to the Emergency Department brought in by EMS complaining of SOB onset 6 months. His SOB is worsened with exacerbation.  He does not wear oxygen at home.  Has associated dry cough, bilateral lower extremity leg swelling.  Has not tried any medications for his symptoms.  He notes that he first noticed his shortness of breath when he was sitting and became short of breath. Denies chest pain, fever, chills, abdominal pain, nausea, vomiting, back pain, neck pain, sore throat, nasal congestion. No asthma, COPD, heart failure. Denies past medical history of DVT/PE, anticoagulant use.    The history is provided by the patient. No language interpreter was used.      Home Medications Prior to Admission medications   Medication Sig Start Date End Date Taking? Authorizing Provider  acetaminophen (TYLENOL) 500 MG tablet Take 1,000 mg by mouth See admin instructions. Take 1000 mg 3 times daily, may take 1000 mg every 8 hours as needed for mild/moderate pain    [provider]  aspirin EC 81 MG tablet Take 81 mg by mouth daily.    [provider]  B-D ULTRAFINE III SHORT PEN 31G X 8 MM MISC Inject into the skin 5 (five) times daily. 11/26/19   [provider]  calcium carbonate (TUMS - DOSED IN MG ELEMENTAL CALCIUM) 500 MG chewable tablet Chew 1,000 mg by mouth daily as needed for indigestion or heartburn.    [provider]  ciprofloxacin (CIPRO) 500 MG tablet Take 1 tablet (500 mg total) by mouth 2 (two) times daily. Patient not taking: Reported on 06/15/2021 04/28/21   Debbe Odea, MD  clopidogrel (PLAVIX) 75 MG tablet Take 75 mg by mouth daily.    [provider]  diltiazem  (CARDIZEM CD) 120 MG 24 hr capsule Take 120 mg by mouth daily. Patient not taking: Reported on 06/15/2021 03/16/20   [provider]  diltiazem (CARDIZEM LA) 180 MG 24 hr tablet Take 180 mg by mouth daily.    [provider]  famotidine (PEPCID) 20 MG tablet Take 20 mg by mouth every evening.    [provider]  furosemide (LASIX) 80 MG tablet Take 80 mg by mouth daily.    [provider]  glucose blood (FREESTYLE LITE) test strip TEST 3 TIMES A DAY E11.9    [provider]  insulin aspart (NOVOLOG) 100 UNIT/ML injection Inject 3 Units into the skin 3 (three) times daily with meals. 04/28/21   Debbe Odea, MD  insulin glargine (LANTUS SOLOSTAR) 100 UNIT/ML Solostar Pen Inject 24 Units into the skin at bedtime.    [provider]  lactulose (CHRONULAC) 10 GM/15ML solution Take 30 mLs (20 g total) by mouth 2 (two) times daily as needed for mild constipation or moderate constipation. 04/28/21   Debbe Odea, MD  Lancets (FREESTYLE) lancets AS DIRECTED THREE TIMES A DAY    [provider]  nitroGLYCERIN (NITROSTAT) 0.4 MG SL tablet Place 0.4 mg under the tongue every 5 (five) minutes as needed for chest pain.    [provider]  NON FORMULARY Apply 1 application topically 2 (two) times daily. Skin Prep, apply to bilateral heels  [provider]  pantoprazole (PROTONIX) 40 MG tablet Take 40 mg by mouth daily.    [provider]  polyethylene glycol (MIRALAX / GLYCOLAX) 17 g packet Take 17 g by mouth 2 (two) times daily. 04/28/21   Debbe Odea, MD  POTASSIUM CHLORIDE ER PO Take 20 mEq by mouth 2 times daily at 12 noon and 4 pm.    [provider]  Povidone-Iodine (BETADINE EX) Apply 1 application topically 2 (two) times daily. To left foot and left foot 4th digit    [provider]  senna-docusate (SENOKOT-S) 8.6-50 MG tablet Take 2 tablets by mouth 2 (two) times daily. 04/28/21   Debbe Odea, MD       Allergies    Metformin hcl and Pravastatin sodium    Review of Systems   Review of Systems  Constitutional:  Negative for chills and fever.  HENT:  Positive for rhinorrhea. Negative for congestion and sore throat.   Respiratory:  Positive for cough (dry) and shortness of breath.   Cardiovascular:  Positive for leg swelling (BLE). Negative for chest pain.  Gastrointestinal:  Negative for abdominal pain, nausea and vomiting.  Musculoskeletal:  Negative for back pain and neck pain.  All other systems reviewed and are negative.  Physical Exam Updated Vital Signs BP 133/75 (BP Location: Right Arm)    Pulse 99    Temp 98.9 F (37.2 C) (Oral)    Resp (!) 27    SpO2 99%  Physical Exam Vitals and nursing note reviewed.  Constitutional:      General: He is not in acute distress.    Appearance: He is not diaphoretic.     Comments: Nasal cannula in place.  HENT:     Head: Normocephalic and atraumatic.     Mouth/Throat:     Pharynx: No oropharyngeal exudate.  Eyes:     General: No scleral icterus.    Conjunctiva/sclera: Conjunctivae normal.  Cardiovascular:     Rate and Rhythm: Normal rate and regular rhythm.     Pulses: Normal pulses.     Heart sounds: Normal heart sounds.  Pulmonary:     Effort: Pulmonary effort is normal. No respiratory distress.     Breath sounds: Normal breath sounds. No wheezing.     Comments: Diminished breath sounds noted throughout lung fields. Abdominal:     General: Bowel sounds are normal.     Palpations: Abdomen is soft. There is no mass.     Tenderness: There is no abdominal tenderness. There is no guarding or rebound.  Musculoskeletal:        General: Normal range of motion.     Cervical back: Normal range of motion and neck supple.     Comments: Trace pitting edema noted to bilateral lower extremities.  Well-healing surgical sites noted to bilateral toes without erythema or drainage.  Skin:    General: Skin is warm and dry.  Neurological:      Mental Status: He is alert.  Psychiatric:        Behavior: Behavior normal.    ED Results / Procedures / Treatments   Labs (all labs ordered are listed, but only abnormal results are displayed) Labs Reviewed - No data to display  EKG None  Radiology No results found.  Procedures Procedures    Medications Ordered in ED Medications - No data to display  ED Course/ Medical Decision Making/ A&P Clinical Course as of 06/18/21 2304  Fri Jun 18, 2021  1806 Troponin I (High  Sensitivity)(!): 45 [SB]  1816 B Natriuretic Peptide(!): 2,098.7 Will give 40 mg Lasix IV [SB]  1062 Pt re-evaluated and informed of lab and imaging findings. [SB]  [phone] Consult with Cardiologist Dr. Virgina Jock who agrees with admission at this time with medicine. He notes that he will be available in consult.  [SB]  [phone] Consult with hospitalist, Dr. Flossie Buffy who agrees with admission at this time. [SB]    Clinical Course User Index [SB] Gearldine Looney A, PA-C                           Medical Decision Making Amount and/or Complexity of Data Reviewed Labs: ordered. Decision-making details documented in ED Course. Radiology: ordered.  Risk Prescription drug management. Decision regarding hospitalization.   Patient presents to the ED complaining of shortness of breath onset 6 months.  Vital signs stable, patient afebrile, not tachycardic or hypoxic.  Patient overall well-appearing during exam today.  Patient does not wear oxygen at baseline, on initial physical exam patient with 1 L oxygen nasal cannula in place.  On repeat examination, oxygen increased to 2 L via nasal cannula.  Oxygen saturation remained above 95%.  On exam patient with diminished breath sounds noted throughout lung fields otherwise no acute cardiovascular, respiratory, abdominal exam findings.  Trace pitting edema noted bilaterally. Recent echocardiogram on 04/20/2021 showed EF of 40-45%. Differential diagnosis includes CHF exacerbation, ACS,  PTX, PNA.   Additional history obtained:  External records from outside source obtained and reviewed including: Pt had amputation of the right great toe performed by podiatrist, [name] on 04/20/2021.  Patient has a surgery scheduled on 07/26/2021 with podiatrist Dr. Posey Pronto for transmetatarsal amputation of the left foot.  Echocardiogram from 04/20/2021 notable for left ventricular ejection fraction of 40-45%.  Review of patient chart, patient is not on any antidiuretics at this time.  EKG: Sinus tachycardia without acute ST/T changes.  Labs:  I ordered, and personally interpreted labs.  The pertinent results include:  BMP with sodium at 129, glucose at 109, BUN at 26, creatinine at 1.56, GFR decreased to 45.  Overall stable from previous values. CBC unremarkable without leukocytosis. BNP 2,098.7 Initial troponin of 45, repeat troponin at 33. COVID and flu swab negative Blood cultures ordered with results pending.  Imaging: I ordered imaging studies including chest x-ray I independently visualized and interpreted imaging which showed: Atelectasis versus pneumonia in the bibasilar fields. I agree with the radiologist interpretation  Medications:  I ordered medication including Lasix for diuretic Reevaluation of the patient after these medicines showed that the patient improved I have reviewed the patients home medicines and have made adjustments as needed  Consultations: I requested consultation with the cardiologist, Dr. Virgina Jock,  and discussed lab and imaging findings as well as pertinent plan - they recommend: Admission with medicine and he will be available in consult. Consult with hospitalist, Dr. Flossie Buffy who agrees with admission at this time.   Disposition: Patient presentation suspicious for CHF exacerbation with new oxygen requirement.  Doubt pneumothorax, pneumonia at this time.  Patient troponin elevated however likely elevated in the setting of demand due to CHF doubt ACS at  this time.  After consideration of the diagnostic results and the patients response to treatment, I feel that the patient would benefit from admission to the hospital.  Discussed with patient admission plans, patient agreeable at this time.  Patient appears safe for admission.     This chart was dictated using  voice recognition software, Diplomatic Services operational officer. Despite the best efforts of this provider to proofread and correct errors, errors may still occur which can change documentation meaning.  Final Clinical Impression(s) / ED Diagnoses Final diagnoses:  Shortness of breath  Acute on chronic congestive heart failure, unspecified heart failure type [facility])    Rx / DC Orders ED Discharge Orders     None         Lauran Romanski A, PA-C 06/18/21 2305

## 2021-06-18 NOTE — Assessment & Plan Note (Addendum)
-  Creatinine of 1.56 from prior of 1.36. Suspect more vascular congestion. Monitor closely with diuresis.  BUN/creatinine went up slightly and is now 24/1.62 -> 26/1.73 -> 26/1.62 -> 29/1.76 -Avoid further nephrotoxic medications, contrast dyes, hypotension and  renally dose medications -Repeat CMP in a.m.

## 2021-06-18 NOTE — Assessment & Plan Note (Addendum)
-  Sodium of 129 on Admission.  Suspect due to hypervolemia follow with repeat in the morning after diuresis. -Sodium is now slightly improved 130 x2 -> 132 -Continue to monitor and trend and repeat CMP in a.m.

## 2021-06-18 NOTE — Assessment & Plan Note (Addendum)
-  Continue Aspirin and Plavix -Cards consulted and he has absent pedal pulses and follows with Dr. [name] of Podiatry and plan is for Surgical Intervention on the 4th Toe in April with a Transmetatarsal Amputation of the Left Foot in April   -Patient to have plan for repeat Angiography per Dr. [name]

## 2021-06-18 NOTE — Assessment & Plan Note (Addendum)
-  Found to have Systolic Dysfunction CXR with bilateral pleural effusion with hypoxia and LE edema on exam -Recent TEE on 1/16 shows no vegetation, EF of 60 to 65% with moderate calcified mitral stenosis. Pt recently completed 8 week course of IV cefazolin and Cipro on 3/6. No fever or leukocytosis to suggest ongoing infection. -Repeat ECHO showed an EF of 25 to 30% with severely decreased left ventricular function and left ventricular diastolic function could not be evaluated.  He has mild RV dysfunction as well that was new and valvular findings are relatively unchanged -Lasix IV '40mg'$  daily increased to BID and now they will decrease to Daily  -Patient is Negative 2.635 Liters  -Strict intake and output, daily weights -Cardiology was consulted and they are recommending nuclear medicine stress test on Monday and cardiac cath on Tuesday -Cardiology recommends adding Corlanor 5 mg twice daily and were holding beta-blocker due to decompensated heart failure but now they are adding Bisoprolol 2.5 mg Daily  -Currently holding of ARB and ARNI given possible cath and recommending adding Farxiga and after the cath -Plan is for nuclear medicine stress test tomorrow

## 2021-06-18 NOTE — H&P (Signed)
History and Physical    Patient: Zachary [name] NAT:[id number] DOB: Mar 11, 1946 DOA: 06/18/2021 DOS: the patient was seen and examined on 06/19/2021 PCP: [name], [place]  Patient coming from: Home  Chief Complaint:  Chief Complaint  Patient presents with   Shortness of Breath   HPI: Zachary [name] is a 76 y.o. male with medical history significant of uncontrolled type 2 diabetes, hypertension, hyperlipidemia, PVD, osteomyelitis, MSSA bacteremia who presents with concerns of increasing shortness of breath.  Reports for the past 3 days he has noticed progressive increasing shortness of breath with exertion.  Also feels very edematous with lower extremity swelling.  Denies any chest pain.  No cough or fever.  Patient was just discharged from rehab several days ago following a prolonged hospital course.  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  Later found to have MSSA bacteremia, E. 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  TEE on 1/16 shows no vegetation, EF of 60 to 65% with moderate calcified mitral stenosis.  Also later discovered to have osteomyelitis of the left fourth distal phalanx. Initially treated on cefepime but right hand jerking and was discontinued by neurology.  Ultimately was placed on IV cefazolin and oral ciprofloxacin per ID and completed on 06/14/2021.  In the ED, he was afebrile but hypoxic requiring 2 L via nasal cannula.  No leukocytosis sodium of 129, K of 4.1, creatinine of 1.56 from prior of 1.36.  BNP of 2098.  Troponin of 45 and 33.   EKG on my review shows right axis deviation with intraventricular conduction delay.   Review of Systems: As mentioned in the history of present illness. All other systems reviewed and are negative. Past Medical History:  Diagnosis Date   AKI (acute kidney injury) ([place])  06/18/2021   Diabetes mellitus    Hypercholesteremia    Hypertension    Patella fracture    left   PVD (peripheral vascular disease) ([place])    Past Surgical History:  Procedure Laterality Date   AMPUTATION     AMPUTATION  11/28/2011   Procedure: AMPUTATION RAY;  Surgeon: [name], MD;  Location: [facility];  Service: Orthopedics;  Laterality: Left;  left 2nd toe amputation   AMPUTATION TOE Right 09/14/2019   Procedure: AMPUTATION TOE RIGHT SECOND TOE;  Surgeon: [name], [name];  Location: [facility];  Service: Podiatry;  Laterality: Right;   AMPUTATION TOE Right 04/20/2021   Procedure: AMPUTATION GREAT TOE; APPLICATION OF SKIN GRAFT SUBSTITUTE;  Surgeon: [name], [name];  Location: [place];  Service: Podiatry;  Laterality: Right;   BUBBLE STUDY  04/26/2021   Procedure: BUBBLE STUDY;  Surgeon: Geralynn Rile, MD;  Location: [place];  Service: Cardiovascular;;   IR ANGIOGRAM EXTREMITY BILATERAL  03/03/2021   IR ANGIOGRAM EXTREMITY LEFT  05/14/2021   IR ANGIOGRAM EXTREMITY RIGHT  03/03/2021   IR ANGIOGRAM EXTREMITY RIGHT  04/14/2021   IR FEM POP ART PTA MOD SED  03/03/2021   IR FEM POP INC ATHEREC / STENT / PTA MOD SED  05/14/2021   IR INTRAVASCULAR ULTRASOUND NON CORONARY  03/03/2021   IR RADIOLOGIST EVAL & MGMT  09/27/2019   IR RADIOLOGIST EVAL & MGMT  12/18/2019   IR RADIOLOGIST EVAL & MGMT  02/11/2021   IR RADIOLOGIST EVAL & MGMT  04/09/2021  IR TIB-PERO ART ATHEREC INC PTA MOD SED  03/03/2021   IR TIB-PERO ART PTA MOD SED  04/14/2021   IR US GUIDE VASC ACCESS LEFT  03/03/2021   IR US GUIDE VASC ACCESS RIGHT  04/14/2021   IR US GUIDE VASC ACCESS RIGHT  05/14/2021   ORIF PATELLA Left 01/10/2019   Procedure: OPEN REDUCTION INTERNAL (ORIF) FIXATION LEFT PATELLA FRACTURE;  Surgeon: [name], MD;  Location: [place];  Service: Orthopedics;  Laterality: Left;   TEE WITHOUT CARDIOVERSION N/A 04/26/2021   Procedure: TRANSESOPHAGEAL ECHOCARDIOGRAM (TEE);  Surgeon:  Geralynn Rile, MD;  Location: [place];  Service: Cardiovascular;  Laterality: N/A;   Social History:  reports that he has never smoked. He has never used smokeless tobacco. He reports that he does not drink alcohol and does not use drugs.  Allergies  Allergen Reactions   Metformin Hcl Other (See Comments)    Unknown reaction   Pravastatin Sodium Other (See Comments)    Muscle pain     No family history on file.  Prior to Admission medications   Medication Sig Start Date End Date Taking? Authorizing Provider  acetaminophen (TYLENOL) 500 MG tablet Take 1,000 mg by mouth See admin instructions. Take 1000 mg 3 times daily, may take 1000 mg every 8 hours as needed for mild/moderate pain    [provider]  aspirin EC 81 MG tablet Take 81 mg by mouth daily.    [provider]  B-D ULTRAFINE III SHORT PEN 31G X 8 MM MISC Inject into the skin 5 (five) times daily. 11/26/19   [provider]  calcium carbonate (TUMS - DOSED IN MG ELEMENTAL CALCIUM) 500 MG chewable tablet Chew 1,000 mg by mouth daily as needed for indigestion or heartburn.    [provider]  ciprofloxacin (CIPRO) 500 MG tablet Take 1 tablet (500 mg total) by mouth 2 (two) times daily. Patient not taking: Reported on 06/15/2021 04/28/21   Debbe Odea, MD  clopidogrel (PLAVIX) 75 MG tablet Take 75 mg by mouth daily.    [provider]  diltiazem (CARDIZEM CD) 120 MG 24 hr capsule Take 120 mg by mouth daily. Patient not taking: Reported on 06/15/2021 03/16/20   [provider]  diltiazem (CARDIZEM LA) 180 MG 24 hr tablet Take 180 mg by mouth daily.    [provider]  famotidine (PEPCID) 20 MG tablet Take 20 mg by mouth every evening.    [provider]  furosemide (LASIX) 80 MG tablet Take 80 mg by mouth daily.    [provider]  glucose blood (FREESTYLE LITE) test strip TEST 3 TIMES A DAY E11.9    [provider]  insulin aspart  (NOVOLOG) 100 UNIT/ML injection Inject 3 Units into the skin 3 (three) times daily with meals. 04/28/21   Debbe Odea, MD  insulin glargine (LANTUS SOLOSTAR) 100 UNIT/ML Solostar Pen Inject 24 Units into the skin at bedtime.    [provider]  lactulose (CHRONULAC) 10 GM/15ML solution Take 30 mLs (20 g total) by mouth 2 (two) times daily as needed for mild constipation or moderate constipation. 04/28/21   Debbe Odea, MD  Lancets (FREESTYLE) lancets AS DIRECTED THREE TIMES A DAY    [provider]  nitroGLYCERIN (NITROSTAT) 0.4 MG SL tablet Place 0.4 mg under the tongue every 5 (five) minutes as needed for chest pain.    [provider]  NON FORMULARY Apply 1 application topically 2 (two) times daily.  Skin Prep, apply to bilateral heels    [provider]  pantoprazole (PROTONIX) 40 MG tablet Take 40 mg by mouth daily.    [provider]  polyethylene glycol (MIRALAX / GLYCOLAX) 17 g packet Take 17 g by mouth 2 (two) times daily. 04/28/21   Debbe Odea, MD  POTASSIUM CHLORIDE ER PO Take 20 mEq by mouth 2 times daily at 12 noon and 4 pm.    [provider]  Povidone-Iodine (BETADINE EX) Apply 1 application topically 2 (two) times daily. To left foot and left foot 4th digit    [provider]  senna-docusate (SENOKOT-S) 8.6-50 MG tablet Take 2 tablets by mouth 2 (two) times daily. 04/28/21   Debbe Odea, MD    Physical Exam: Vitals:   06/18/21 1900 06/18/21 1930 06/18/21 2110 06/18/21 2324  BP: 101/81 117/75 (!) 132/92 113/70  Pulse: 92 91 95 85  Resp: 20 (!) '28 18 17  '$ Temp:   97.9 F (36.6 C) 98.7 F (37.1 C)  TempSrc:   Oral Axillary  SpO2: 99% 100% 95% 99%  Weight:   86.4 kg   Height:   '5\' 8"'$  (1.727 m)    Constitutional: NAD, calm, comfortable, Chronically ill appearing male laying at about 40 degree incline in bed Eyes:  lids and conjunctivae normal ENMT: Mucous membranes are moist.  Neck: normal, supple Respiratory:  clear to auscultation bilaterally, no wheezing, no crackles. Normal respiratory effort on 2L [place]. No accessory muscle use.  Cardiovascular: Regular rate and rhythm, no murmurs / rubs / gallops. +2 pitting edema of bilateral LE up to knee.  Abdomen: no tenderness,  Bowel sounds positive.  Musculoskeletal: no clubbing / cyanosis.Previously amputated great and second toe of the left foot.Ulceration seen on 4th digit of left foot. Amputation of right great toe.   Skin: erythema of bilateral LE pre-tibial region reflective of chronic venous stasis changes.  Neurologic: CN 2-12 grossly intact. Sensation intact, DTR normal. Strength 5/5 in all 4.  Psychiatric: Normal judgment and insight. Alert and oriented x 3. Normal mood. Data Reviewed:  See HPI  Assessment and Plan: * Acute CHF (congestive heart failure) ([place]) -CXR with bilateral pleural effusion with hypoxia and LE edema on exam -Recent TEE on 1/16 shows no vegetation, EF of 60 to 65% with moderate calcified mitral stenosis. Pt recently completed 8 week course of IV cefazolin and Cipro on 3/6. No fever or leukocytosis to suggest ongoing infection. -obtain repeat echo -Lasix IV '40mg'$  daily -strict intake and output, daily weights -Cardiology was consulted by ED physician and will see in consultation  Acute respiratory failure with hypoxia ([place]) -secondary to new CHF exacerbation. Admit on 2L  -Maintain O2 >92% and wean as tolerated with IV diuresis as above  AKI (acute kidney injury) ([place]) -creatinine of 1.56 from prior of 1.36. Suspect more vascular congestion. Monitor closely with diuresis.   Chronic osteomyelitis ([place]) -Follows with Podiatry.  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  He has plans for transmetatarsal amputation of the left foot in April with podiatry.  Hyponatremia Sodium of 129.  Suspect due to hypovolemia.  Follow with repeat in  the morning after diuresis.  Peripheral vascular disease ([place]) Continue aspirin and Plavix  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 ([place]) Start sensitive SSI for now pending med rec  Essential hypertension Continue diltiazem      Advance Care Planning:  Code Status: Full Code   Consults: cardiology  Family Communication: No family at bedside  Severity of Illness: The appropriate patient status for this patient is INPATIENT. Inpatient status is judged to be reasonable and necessary in order to provide the required intensity of service to ensure the patient's safety. The patient's presenting symptoms, physical exam findings, and initial radiographic and laboratory data in the context of their chronic comorbidities is felt to place them at high risk for further clinical deterioration. Furthermore, it is not anticipated that the patient will be medically stable for discharge from the hospital within 2 midnights of admission.   * I certify that at the point of admission it is my clinical judgment that the patient will require inpatient hospital care spanning beyond 2 midnights from the point of admission due to high intensity of service, high risk for further deterioration and high frequency of surveillance required.*  Author: Orene Desanctis, DO 06/19/2021 1:18 AM  For on call review [email or web address].

## 2021-06-18 NOTE — Assessment & Plan Note (Addendum)
-  Continued Diltiazem but have now discontinued  -C/w Bisoprolol 2.5 mg po Daily, Furosemide IV 40 mg Daily -Continue to Monitor BP per Protocol -Last BP reading was 101/50

## 2021-06-18 NOTE — Progress Notes (Signed)
Patient received via stretcher with E.R. nurse. Alert and orin. Orin. To room R.N. into do assesment. ?

## 2021-06-18 NOTE — Assessment & Plan Note (Addendum)
-  Secondary to new CHF exacerbation. Admit on 2L  -SpO2: 95 % O2 Flow Rate (L/min): 2 L/min -Has bilateral pleural effusions worse on the right compared to left and may benefit from thoracentesis but he is on aspirin Plavix will need to confirm if this is okay with interventional radiology; They are able to do the Thoracentesis and he had 2.4 Liters removed from the Right and will have the Left one done tomorrow -Maintain O2 >92% and wean as tolerated with IV diuresis as above -We will need an ambulatory home O2 screen prior to discharge and a repeat chest x-ray -Continuous pulse oximetry maintain O2 saturation greater 90% -Continue supplemental oxygen via nasal cannula and wean to room air if able

## 2021-06-18 NOTE — ED Triage Notes (Signed)
Pt bib [facility] from home where he lives with his wife, with complaints of increased shob with exertion and an episode of chest pain today. Pt was told 2 months ago that he probably has CHF but was never diagnoses with such. Pt given '324mg'$  ASA prior to EMS arrival. Pt arrives with [place] 2L on for comfort, AOx4, denies pain.  ?EMS vitals: 136/70, 98HR, 94% RA ?

## 2021-06-18 NOTE — Assessment & Plan Note (Addendum)
-  Follows with Podiatry.  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  He has plans for transmetatarsal amputation of the left foot in April with Podiatry. ? ?

## 2021-06-19 ENCOUNTER — Inpatient Hospital Stay (HOSPITAL_COMMUNITY): Payer: Medicare PPO

## 2021-06-19 DIAGNOSIS — J9601 Acute respiratory failure with hypoxia: Secondary | ICD-10-CM | POA: Diagnosis not present

## 2021-06-19 DIAGNOSIS — N179 Acute kidney failure, unspecified: Secondary | ICD-10-CM | POA: Diagnosis not present

## 2021-06-19 DIAGNOSIS — M8660 Other chronic osteomyelitis, unspecified site: Secondary | ICD-10-CM | POA: Diagnosis not present

## 2021-06-19 DIAGNOSIS — I5021 Acute systolic (congestive) heart failure: Secondary | ICD-10-CM | POA: Diagnosis not present

## 2021-06-19 LAB — HEPATIC FUNCTION PANEL
ALT: 7 U/L (ref 0–44)
AST: 17 U/L (ref 15–41)
Albumin: 2.7 g/dL — ABNORMAL LOW (ref 3.5–5.0)
Alkaline Phosphatase: 51 U/L (ref 38–126)
Bilirubin, Direct: 0.2 mg/dL (ref 0.0–0.2)
Indirect Bilirubin: 0.6 mg/dL (ref 0.3–0.9)
Total Bilirubin: 0.8 mg/dL (ref 0.3–1.2)
Total Protein: 5.6 g/dL — ABNORMAL LOW (ref 6.5–8.1)

## 2021-06-19 LAB — CBC WITH DIFFERENTIAL/PLATELET
Abs Immature Granulocytes: 0.03 10*3/uL (ref 0.00–0.07)
Basophils Absolute: 0.1 10*3/uL (ref 0.0–0.1)
Basophils Relative: 1 %
Eosinophils Absolute: 0.2 10*3/uL (ref 0.0–0.5)
Eosinophils Relative: 2 %
HCT: 36.3 % — ABNORMAL LOW (ref 39.0–52.0)
Hemoglobin: 11.7 g/dL — ABNORMAL LOW (ref 13.0–17.0)
Immature Granulocytes: 0 %
Lymphocytes Relative: 12 %
Lymphs Abs: 1 10*3/uL (ref 0.7–4.0)
MCH: 28.4 pg (ref 26.0–34.0)
MCHC: 32.2 g/dL (ref 30.0–36.0)
MCV: 88.1 fL (ref 80.0–100.0)
Monocytes Absolute: 0.8 10*3/uL (ref 0.1–1.0)
Monocytes Relative: 9 %
Neutro Abs: 6.7 10*3/uL (ref 1.7–7.7)
Neutrophils Relative %: 76 %
Platelets: 184 10*3/uL (ref 150–400)
RBC: 4.12 MIL/uL — ABNORMAL LOW (ref 4.22–5.81)
RDW: 14.3 % (ref 11.5–15.5)
WBC: 8.8 10*3/uL (ref 4.0–10.5)
nRBC: 0 % (ref 0.0–0.2)

## 2021-06-19 LAB — BASIC METABOLIC PANEL
Anion gap: 10 (ref 5–15)
BUN: 24 mg/dL — ABNORMAL HIGH (ref 8–23)
CO2: 29 mmol/L (ref 22–32)
Calcium: 8.3 mg/dL — ABNORMAL LOW (ref 8.9–10.3)
Chloride: 91 mmol/L — ABNORMAL LOW (ref 98–111)
Creatinine, Ser: 1.62 mg/dL — ABNORMAL HIGH (ref 0.61–1.24)
GFR, Estimated: 43 mL/min — ABNORMAL LOW (ref >60)
Glucose, Bld: 90 mg/dL (ref 70–99)
Potassium: 3.9 mmol/L (ref 3.5–5.1)
Sodium: 130 mmol/L — ABNORMAL LOW (ref 135–145)

## 2021-06-19 LAB — ECHOCARDIOGRAM COMPLETE
AR max vel: 0.9 cm2
AV Area VTI: 0.87 cm2
AV Area mean vel: 0.85 cm2
AV Mean grad: 22 mmHg
AV Peak grad: 39.6 mmHg
Ao pk vel: 3.15 m/s
Area-P 1/2: 3.42 cm2
Height: 68 in
MV M vel: 5.43 m/s
MV Peak grad: 117.7 mmHg
MV VTI: 1.37 cm2
P 1/2 time: 244 msec
S' Lateral: 4 cm
Weight: 3015.89 oz

## 2021-06-19 LAB — GLUCOSE, CAPILLARY
Glucose-Capillary: 93 mg/dL (ref 70–99)
Glucose-Capillary: 122 mg/dL — ABNORMAL HIGH (ref 70–99)
Glucose-Capillary: 172 mg/dL — ABNORMAL HIGH (ref 70–99)
Glucose-Capillary: 152 mg/dL — ABNORMAL HIGH (ref 70–99)

## 2021-06-19 MED ORDER — FUROSEMIDE 10 MG/ML IJ SOLN
40.0000 mg | Freq: Two times a day (BID) | INTRAMUSCULAR | Status: DC
  Administered 2021-06-19 – 2021-06-20 (×3): 40 mg via INTRAVENOUS
  Filled 2021-06-19 (×3): qty 4

## 2021-06-19 MED ORDER — ROSUVASTATIN CALCIUM 20 MG PO TABS
20.0000 mg | ORAL_TABLET | Freq: Every day | ORAL | Status: DC
  Administered 2021-06-19 – 2021-06-29 (×11): 20 mg via ORAL
  Filled 2021-06-19 (×11): qty 1

## 2021-06-19 MED ORDER — DILTIAZEM HCL ER COATED BEADS 180 MG PO CP24
180.0000 mg | ORAL_CAPSULE | Freq: Every day | ORAL | Status: DC
Start: 2021-06-19 — End: 2021-06-19
  Administered 2021-06-19: 180 mg via ORAL
  Filled 2021-06-19: qty 1

## 2021-06-19 MED ORDER — IVABRADINE HCL 5 MG PO TABS
5.0000 mg | ORAL_TABLET | Freq: Two times a day (BID) | ORAL | Status: DC
Start: 2021-06-19 — End: 2021-06-28
  Administered 2021-06-19 – 2021-06-27 (×16): 5 mg via ORAL
  Filled 2021-06-19 (×20): qty 1

## 2021-06-19 MED ORDER — PERFLUTREN LIPID MICROSPHERE
1.0000 mL | INTRAVENOUS | Status: AC | PRN
  Administered 2021-06-19: 5 mL via INTRAVENOUS
  Filled 2021-06-19: qty 10

## 2021-06-19 MED ORDER — INSULIN ASPART 100 UNIT/ML IJ SOLN
0.0000 [IU] | Freq: Three times a day (TID) | INTRAMUSCULAR | Status: DC
  Administered 2021-06-19: 1 [IU] via SUBCUTANEOUS
  Administered 2021-06-19: 2 [IU] via SUBCUTANEOUS
  Administered 2021-06-20: 3 [IU] via SUBCUTANEOUS
  Administered 2021-06-20 – 2021-06-21 (×3): 2 [IU] via SUBCUTANEOUS
  Administered 2021-06-21 – 2021-06-22 (×2): 3 [IU] via SUBCUTANEOUS
  Administered 2021-06-22 (×2): 2 [IU] via SUBCUTANEOUS
  Administered 2021-06-23: 3 [IU] via SUBCUTANEOUS
  Administered 2021-06-23: 2 [IU] via SUBCUTANEOUS
  Administered 2021-06-23: 5 [IU] via SUBCUTANEOUS
  Administered 2021-06-24: 2 [IU] via SUBCUTANEOUS
  Administered 2021-06-24 (×2): 3 [IU] via SUBCUTANEOUS
  Administered 2021-06-25 (×2): 2 [IU] via SUBCUTANEOUS
  Administered 2021-06-26: 3 [IU] via SUBCUTANEOUS
  Administered 2021-06-26: 2 [IU] via SUBCUTANEOUS
  Administered 2021-06-26: 5 [IU] via SUBCUTANEOUS
  Administered 2021-06-27: 2 [IU] via SUBCUTANEOUS
  Administered 2021-06-27: 1 [IU] via SUBCUTANEOUS
  Administered 2021-06-28: 2 [IU] via SUBCUTANEOUS
  Administered 2021-06-28: 3 [IU] via SUBCUTANEOUS
  Administered 2021-06-28 – 2021-06-29 (×2): 1 [IU] via SUBCUTANEOUS

## 2021-06-19 MED ORDER — ASPIRIN EC 81 MG PO TBEC
81.0000 mg | DELAYED_RELEASE_TABLET | Freq: Every day | ORAL | Status: DC
Start: 2021-06-19 — End: 2021-06-29
  Administered 2021-06-19 – 2021-06-29 (×10): 81 mg via ORAL
  Filled 2021-06-19 (×10): qty 1

## 2021-06-19 MED ORDER — CLOPIDOGREL BISULFATE 75 MG PO TABS
75.0000 mg | ORAL_TABLET | Freq: Every day | ORAL | Status: DC
  Administered 2021-06-19 – 2021-06-22 (×4): 75 mg via ORAL
  Filled 2021-06-19 (×4): qty 1

## 2021-06-19 NOTE — Progress Notes (Signed)
PROGRESS NOTE    Zachary [name]  ZYS:[id number] DOB: 06/28/1945 DOA: 06/18/2021 PCP: [name], FNP   Brief Narrative:  HPI per Dr. Ileene Musa Zachary [name] is a 76 y.o. male with medical history significant of uncontrolled type 2 diabetes, hypertension, hyperlipidemia, PVD, osteomyelitis, MSSA bacteremia who presents with concerns of increasing shortness of breath.   Reports for the past 3 days he has noticed progressive increasing shortness of breath with exertion.  Also feels very edematous with lower extremity swelling.  Denies any chest pain.  No cough or fever.  Patient was just discharged from rehab several days ago following a prolonged hospital course.   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  Later found to have MSSA bacteremia, E. 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  TEE on 1/16 shows no vegetation, EF of 60 to 65% with moderate calcified mitral stenosis.  Also later discovered to have osteomyelitis of the left fourth distal phalanx. Initially treated on cefepime but right hand jerking and was discontinued by neurology.  Ultimately was placed on IV cefazolin and oral ciprofloxacin per ID and completed on 06/14/2021.   In the ED, he was afebrile but hypoxic requiring 2 L via nasal cannula.  No leukocytosis sodium of 129, K of 4.1, creatinine of 1.56 from prior of 1.36.  BNP of 2098.  Troponin of 45 and 33.   EKG on my review shows right axis deviation with intraventricular conduction delay.   **Interim History Cardiology was consulted and patient was found to have heart failure reduced ejection fraction with regional wall motion abnormalities and a mild right ventricular dysfunction that was new.  Given his comorbidities cardiology recommended Lasix IV twice daily and adding Zachary [name] nor 5 mg twice daily and holding  beta-blocker due to decompensated heart failure and adding [place].  They are recommending a nuclear stress test to evaluate for ischemia versus infarction on Monday, 06/21/2021 and then performing a right and left heart cath and coronary angiography on 06/22/2021    Assessment and Plan: * Acute CHF (congestive heart failure) ([place]) -Found to have Systolic Dysfunction CXR with bilateral pleural effusion with hypoxia and LE edema on exam -Recent TEE on 1/16 shows no vegetation, EF of 60 to 65% with moderate calcified mitral stenosis. Pt recently completed 8 week course of IV cefazolin and Cipro on 3/6. No fever or leukocytosis to suggest ongoing infection. -Repeat ECHO showed an EF of 25 to 30% with severely decreased left ventricular function and left ventricular diastolic function could not be evaluated.  He has mild RV dysfunction as well that was new and valvular findings are relatively unchanged -Lasix IV '40mg'$  daily increased to BID  -strict intake and output, daily weights -Cardiology was consulted and they are recommending nuclear medicine stress test on Monday and cardiac cath on Tuesday -Cardiology recommends adding Corlanor 5 mg twice daily and holding beta-blocker due to decompensated heart failure and holding of ARB and ARNI given possible cath and recommending adding Farxiga and after the cath  Acute respiratory failure with hypoxia ([place]) -secondary to new CHF exacerbation. Admit on 2L  -SpO2: 97 % O2 Flow Rate (L/min): 2 L/min -Has bilateral pleural effusions worse on the right compared to left and may benefit from thoracentesis but he is on aspirin Plavix will need to confirm if this is  okay with interventional radiology -Maintain O2 >92% and wean as tolerated with IV diuresis as above  AKI (acute kidney injury) ([place]) -creatinine of 1.56 from prior of 1.36. Suspect more vascular congestion. Monitor closely with diuresis.  BUN/creatinine went up slightly and is now 24/1.62 -Avoid  further nephrotoxic medications, contrast dyes, hypotension renally dose medications -Repeat CMP in a.m.   Chronic osteomyelitis ([place]) -Follows with Podiatry.  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  He has plans for transmetatarsal amputation of the left foot in April with podiatry.   Hyponatremia Sodium of 129.  Suspect due to hypervolemia follow with repeat in the morning after diuresis. -Sodium is now slightly improved 130 -Continue to monitor and trend and repeat CMP in a.m.  Peripheral vascular disease ([place]) Continue aspirin and Plavix -Cards consulted   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 (HCC) Start sensitive SSI for now and will adjust   Essential hypertension Continue diltiazem and cardiology is adding: On holding beta-blocker for now   DVT prophylaxis: enoxaparin (LOVENOX) injection 40 mg Start: 06/18/21 2015    Code Status: Full Code Family Communication: No family currently at bedside  Disposition Plan:  Level of care: Telemetry Cardiac Status is: Inpatient Remains inpatient appropriate because: A nuclear medicine stress test on Monday and cardiac cath on Tuesday    Consultants:  Cardiology  Procedures:  Echocardiogram IMPRESSIONS     1. Left ventricular ejection fraction, by estimation, is 25 to 30%. The  left ventricle has severely decreased function. The left ventricle  demonstrates regional wall motion abnormalities (see scoring  diagram/findings for description). Left ventricular  diastolic function could not be evaluated. There is severe hypokinesis of  the left ventricular, mid-apical anteroseptal wall, anterior wall and  apical segment.   2. Right ventricular systolic function is mildly reduced. The right  ventricular size is normal. There is normal pulmonary artery systolic  pressure.   3. Left atrial size was mildly dilated.    4. Right atrial size was mildly dilated.   5. The mitral valve is abnormal. Moderate mitral valve regurgitation.  Moderate mitral stenosis. Moderate mitral annular calcification. Mean PG 9  mmHg, MVA 1.34 cm2 by PHT method, likely more prominent due to resting  tachycardia around 104 bpm.   6. Tricuspid valve regurgitation is mild to moderate.   7. The aortic valve is tricuspid. There is moderate calcification of the  aortic valve. Aortic valve regurgitation is moderate. Aortic valve area,  by VTI measures 0.87 cm. Aortic valve mean gradient measures 22.0 mmHg.  Aortic valve Vmax measures 3.14  m/s. Dimensionless index 0.29.   8. Moderate pleural effusion.   9. Compared to previous transthoracic and transesophageal studies in  04/2021, LVEF is reduced from 40-45%. Regional wall motion abnormalities  (LAD territory) are more pronounced Mild RV dysfunctinon is new. Valvular  findings are relatively unchanged.   FINDINGS   Left Ventricle: Left ventricular ejection fraction, by estimation, is 25  to 30%. The left ventricle has severely decreased function. The left  ventricle demonstrates regional wall motion abnormalities. Severe  hypokinesis of the left ventricular,  mid-apical anteroseptal wall, anterior wall and apical segment. Definity  contrast agent was given IV to delineate the left ventricular endocardial  borders. The left ventricular internal cavity size was normal in size.  There is no left ventricular  hypertrophy. Left ventricular diastolic function could not be evaluated.   Right  Ventricle: The right ventricular size is normal. No increase in  right ventricular wall thickness. Right ventricular systolic function is  mildly reduced. There is normal pulmonary artery systolic pressure. The  tricuspid regurgitant velocity is 2.14  m/s, and with an assumed right atrial pressure of 15 mmHg, the estimated  right ventricular systolic pressure is 17.0 mmHg.   Left Atrium: Left  atrial size was mildly dilated.   Right Atrium: Right atrial size was mildly dilated.   Pericardium: There is no evidence of pericardial effusion.   Mitral Valve: The mitral valve is abnormal. There is moderate  calcification of the mitral valve leaflet(s). Moderate mitral annular  calcification. Moderate mitral valve regurgitation. Moderate mitral valve  stenosis. MV peak gradient, 13.9 mmHg. The mean  mitral valve gradient is 9.0 mmHg.   Tricuspid Valve: The tricuspid valve is grossly normal. Tricuspid valve  regurgitation is mild to moderate.   Aortic Valve: The aortic valve is tricuspid. There is moderate  calcification of the aortic valve. Aortic valve regurgitation is moderate.  Aortic regurgitation PHT measures 244 msec. Aortic valve mean gradient  measures 22.0 mmHg. Aortic valve peak  gradient measures 39.6 mmHg. Aortic valve area, by VTI measures 0.87 cm.   Pulmonic Valve: The pulmonic valve was grossly normal. Pulmonic valve  regurgitation is mild.   Aorta: The aortic root and ascending aorta are structurally normal, with  no evidence of dilitation.   IAS/Shunts: The interatrial septum was not assessed.   Additional Comments: There is a moderate pleural effusion.      LEFT VENTRICLE  PLAX 2D  LVIDd:         4.80 cm   Diastology  LVIDs:         4.00 cm   LV e' medial:    5.44 cm/s  LV PW:         0.80 cm   LV E/e' medial:  32.2  LV IVS:        0.80 cm   LV e' lateral:   6.31 cm/s  LVOT diam:     2.00 cm   LV E/e' lateral: 27.7  LV SV:         48  LV SV Index:   24  LVOT Area:     3.14 cm      RIGHT VENTRICLE            IVC  RV Basal diam:  4.40 cm    IVC diam: 2.30 cm  RV Mid diam:    3.90 cm  RV S prime:     8.27 cm/s  TAPSE (M-mode): 1.6 cm   LEFT ATRIUM             Index        RIGHT ATRIUM           Index  LA diam:        4.20 cm 2.11 cm/m   RA Area:     18.30 cm  LA Vol (A2C):   79.4 ml 39.84 ml/m  RA Volume:   55.10 ml  27.65 ml/m  LA Vol  (A4C):   68.5 ml 34.37 ml/m  LA Biplane Vol: 74.4 ml 37.33 ml/m   AORTIC VALVE  AV Area (Vmax):    0.90 cm  AV Area (Vmean):   0.85 cm  AV Area (VTI):     0.87 cm  AV Vmax:           314.50 cm/s  AV Vmean:  220.500 cm/s  AV VTI:            0.552 m  AV Peak Grad:      39.6 mmHg  AV Mean Grad:      22.0 mmHg  LVOT Vmax:         90.00 cm/s  LVOT Vmean:        60.000 cm/s  LVOT VTI:          0.154 m  LVOT/AV VTI ratio: 0.28  AI PHT:            244 msec     AORTA  Ao Root diam: 2.60 cm  Ao Asc diam:  2.90 cm   MITRAL VALVE                TRICUSPID VALVE  MV Area (PHT): 3.42 cm     TR Peak grad:   18.3 mmHg  MV Area VTI:   1.37 cm     TR Vmax:        214.00 cm/s  MV Peak grad:  13.9 mmHg  MV Mean grad:  9.0 mmHg     SHUNTS  MV Vmax:       1.87 m/s     Systemic VTI:  0.15 m  MV Vmean:      138.0 cm/s   Systemic Diam: 2.00 cm  MV Decel Time: 222 msec  MR Peak grad: 117.7 mmHg  MR Mean grad: 73.0 mmHg  MR Vmax:      542.50 cm/s  MR Vmean:     401.0 cm/s  MV E velocity: 175.00 cm/s  MV A velocity: 162.00 cm/s  MV E/A ratio:  1.08   Antimicrobials:  Anti-infectives (From admission, onward)    None       Subjective: Seen And examined at bedside he states that he still short of breath.  No nausea or vomiting.  Denies any lightheadedness or dizziness.  No other concerns or plans at this time  Objective: Vitals:   06/18/21 2324 06/19/21 0401 06/19/21 0729 06/19/21 1617  BP: 113/70 134/76 128/77 129/75  Pulse: 85 89 93 96  Resp: '17 20 19 17  '$ Temp: 98.7 F (37.1 C) 97.6 F (36.4 C) 97.9 F (36.6 C) 97.8 F (36.6 C)  TempSrc: Axillary Oral Oral Oral  SpO2: 99% 100% 96% 97%  Weight:  85.5 kg    Height:        Intake/Output Summary (Last 24 hours) at 06/19/2021 1910 Last data filed at 06/19/2021 1700 Gross per 24 hour  Intake 600 ml  Output 1125 ml  Net -525 ml   Filed Weights   06/18/21 1521 06/18/21 2110 06/19/21 0401  Weight: 86.6 kg 86.4 kg 85.5  kg    Examination: Physical Exam:  Constitutional: WN/WD overweight chronically ill appearing Caucasian male, in no acute distress appears calm Respiratory: Diminished to auscultation bilaterally with coarse breath sounds and crackles worse on the right compared to left.  No appreciable wheezing or rhonchi.  Has a normal respiratory effort but is wearing supplemental oxygen via nasal cannula Cardiovascular: RRR, has a slight systolic murmur 2 out of 6. S1 and S2 auscultated.  1+ lower extremity edema Abdomen: Soft, non-tender, distended second body habitus.  Bowel sounds positive.  GU: Deferred. Musculoskeletal: He has toe amputations are noted Neurologic: CN 2-12 grossly intact with no focal deficits.   Data Reviewed: I have personally reviewed following labs and imaging studies  CBC: Recent Labs  Lab 06/18/21 1505 06/19/21  0125  WBC 9.9 8.8  NEUTROABS 8.4* 6.7  HGB 12.9* 11.7*  HCT 38.5* 36.3*  MCV 86.9 88.1  PLT 192 010   Basic Metabolic Panel: Recent Labs  Lab 06/18/21 1643 06/19/21 0125  NA 129* 130*  K 4.1 3.9  CL 90* 91*  CO2 27 29  GLUCOSE 109* 90  BUN 26* 24*  CREATININE 1.56* 1.62*  CALCIUM 8.5* 8.3*   GFR: Estimated Creatinine Clearance: 40.6 mL/min (A) (by C-G formula based on SCr of 1.62 mg/dL (H)). Liver Function Tests: Recent Labs  Lab 06/19/21 0125  AST 17  ALT 7  ALKPHOS 51  BILITOT 0.8  PROT 5.6*  ALBUMIN 2.7*   No results for input(s): LIPASE, AMYLASE in the last 168 hours. No results for input(s): AMMONIA in the last 168 hours. Coagulation Profile: No results for input(s): INR, PROTIME in the last 168 hours. Cardiac Enzymes: No results for input(s): CKTOTAL, CKMB, CKMBINDEX, TROPONINI in the last 168 hours. BNP (last 3 results) No results for input(s): PROBNP in the last 8760 hours. HbA1C: No results for input(s): HGBA1C in the last 72 hours. CBG: Recent Labs  Lab 06/19/21 0609 06/19/21 1058 06/19/21 1619  GLUCAP 93 122* 172*    Lipid Profile: No results for input(s): CHOL, HDL, LDLCALC, TRIG, CHOLHDL, LDLDIRECT in the last 72 hours. Thyroid Function Tests: No results for input(s): TSH, T4TOTAL, FREET4, T3FREE, THYROIDAB in the last 72 hours. Anemia Panel: No results for input(s): VITAMINB12, FOLATE, FERRITIN, TIBC, IRON, RETICCTPCT in the last 72 hours. Sepsis Labs: No results for input(s): PROCALCITON, LATICACIDVEN in the last 168 hours.  Recent Results (from the past 240 hour(s))  Resp Panel by RT-PCR (Flu A&B, Covid) Nasopharyngeal Swab     Status: None   Collection Time: 06/18/21  3:17 PM   Specimen: Nasopharyngeal Swab; Nasopharyngeal(NP) swabs in vial transport medium  Result Value Ref Range Status   SARS Coronavirus 2 by RT PCR NEGATIVE NEGATIVE Final    Comment: (NOTE) SARS-CoV-2 target nucleic acids are NOT DETECTED.  The SARS-CoV-2 RNA is generally detectable in upper respiratory specimens during the acute phase of infection. The lowest concentration of SARS-CoV-2 viral copies this assay can detect is 138 copies/mL. A negative result does not preclude SARS-Cov-2 infection and should not be used as the sole basis for treatment or other patient management decisions. A negative result may occur with  improper specimen collection/handling, submission of specimen other than nasopharyngeal swab, presence of viral mutation(s) within the areas targeted by this assay, and inadequate number of viral copies(<138 copies/mL). A negative result must be combined with clinical observations, patient history, and epidemiological information. The expected result is Negative.  Fact Sheet for Patients:  EntrepreneurPulse.com.au  Fact Sheet for Healthcare Providers:  IncredibleEmployment.be  This test is no t yet approved or cleared by the [place] FDA and  has been authorized for detection and/or diagnosis of SARS-CoV-2 by FDA under an Emergency Use Authorization  (EUA). This EUA will remain  in effect (meaning this test can be used) for the duration of the COVID-19 declaration under Section 564(b)(1) of the Act, 21 U.S.C.section 360bbb-3(b)(1), unless the authorization is terminated  or revoked sooner.       Influenza A by PCR NEGATIVE NEGATIVE Final   Influenza B by PCR NEGATIVE NEGATIVE Final    Comment: (NOTE) The Xpert Xpress SARS-CoV-2/FLU/RSV plus assay is intended as an aid in the diagnosis of influenza from Nasopharyngeal swab specimens and should not be used as a sole basis  for treatment. Nasal washings and aspirates are unacceptable for Xpert Xpress SARS-CoV-2/FLU/RSV testing.  Fact Sheet for Patients: EntrepreneurPulse.com.au  Fact Sheet for Healthcare Providers: IncredibleEmployment.be  This test is not yet approved or cleared by the [place] FDA and has been authorized for detection and/or diagnosis of SARS-CoV-2 by FDA under an Emergency Use Authorization (EUA). This EUA will remain in effect (meaning this test can be used) for the duration of the COVID-19 declaration under Section 564(b)(1) of the Act, 21 U.S.C. section 360bbb-3(b)(1), unless the authorization is terminated or revoked.  Performed at [facility], [place] [address], [place], [place] [address]   Culture, blood (routine x 2)     Status: None (Preliminary result)   Collection Time: 06/18/21  5:15 PM   Specimen: BLOOD  Result Value Ref Range Status   Specimen Description BLOOD SITE NOT SPECIFIED  Final   Special Requests   Final    BOTTLES DRAWN AEROBIC AND ANAEROBIC Blood Culture adequate volume   Culture   Final    NO GROWTH < 24 HOURS Performed at [facility], [place] [address] [place], [place] [address]    Report Status PENDING  Incomplete  Culture, blood (routine x 2)     Status: None (Preliminary result)   Collection Time: 06/18/21  5:20 PM   Specimen: BLOOD  Result Value Ref Range Status    Specimen Description BLOOD SITE NOT SPECIFIED  Final   Special Requests   Final    BOTTLES DRAWN AEROBIC AND ANAEROBIC Blood Culture adequate volume   Culture   Final    NO GROWTH < 24 HOURS Performed at [facility], [place] [address] [place], [place] [address]    Report Status PENDING  Incomplete    Radiology Studies: DG Chest 2 View  Result Date: 06/18/2021 CLINICAL DATA:  Shortness of breath EXAM: CHEST - 2 VIEW COMPARISON:  04/19/2021 FINDINGS: Stable cardiomediastinal contours. Low lung volumes with moderate sized bilateral pleural effusions. Hazy bibasilar opacities. No pneumothorax. IMPRESSION: Moderate bilateral pleural effusions with hazy bibasilar opacities, atelectasis versus pneumonia. Electronically Signed   By: Davina Poke D.O.   On: 06/18/2021 15:43   DG CHEST PORT 1 VIEW  Result Date: 06/19/2021 CLINICAL DATA:  Congestive heart failure.  Shortness of breath EXAM: PORTABLE CHEST 1 VIEW COMPARISON:  Yesterday FINDINGS: Mildly degraded exam due to AP portable technique and patient body habitus. Midline trachea. Mild cardiomegaly. Atherosclerosis in the transverse aorta. Moderate right and small left pleural effusions, increased. No pneumothorax. Interstitial prominence and indistinctness is similar. Right upper lobe airspace disease is slightly increased. Right greater than left base airspace disease is similar. IMPRESSION: Overall slightly worsened aeration, with increased interstitial edema and right greater than left airspace disease. The airspace disease could represent atelectasis or concurrent pneumonia. Enlargement of right larger than left pleural effusions. Aortic Atherosclerosis (ICD10-I70.0). Electronically Signed   By: Abigail Miyamoto M.D.   On: 06/19/2021 12:05   ECHOCARDIOGRAM COMPLETE  Result Date: 06/19/2021    ECHOCARDIOGRAM REPORT   Patient Name:   Zachary [name] Date of Exam: 06/19/2021 Medical Rec #:  [id number]       Height:       68.0 in Accession #:     [id number]      Weight:       188.5 lb Date of Birth:  [birth year]       BSA:          1.993 m Patient Age:  75 years        BP:           128/77 mmHg Patient Gender: M               HR:           93 bpm. Exam Location:  Inpatient Procedure: 2D Echo and Intracardiac Opacification Agent Indications:    Dyspnea  History:        Patient has prior history of Echocardiogram examinations, most                 recent 04/20/2021. Risk Factors:Diabetes and Hypertension.  Sonographer:    Arlyss Gandy Referring Phys: [id number] Riva T TU IMPRESSIONS  1. Left ventricular ejection fraction, by estimation, is 25 to 30%. The left ventricle has severely decreased function. The left ventricle demonstrates regional wall motion abnormalities (see scoring diagram/findings for description). Left ventricular diastolic function could not be evaluated. There is severe hypokinesis of the left ventricular, mid-apical anteroseptal wall, anterior wall and apical segment.  2. Right ventricular systolic function is mildly reduced. The right ventricular size is normal. There is normal pulmonary artery systolic pressure.  3. Left atrial size was mildly dilated.  4. Right atrial size was mildly dilated.  5. The mitral valve is abnormal. Moderate mitral valve regurgitation. Moderate mitral stenosis. Moderate mitral annular calcification. Mean PG 9 mmHg, MVA 1.34 cm2 by PHT method, likely more prominent due to resting tachycardia around 104 bpm.  6. Tricuspid valve regurgitation is mild to moderate.  7. The aortic valve is tricuspid. There is moderate calcification of the aortic valve. Aortic valve regurgitation is moderate. Aortic valve area, by VTI measures 0.87 cm. Aortic valve mean gradient measures 22.0 mmHg. Aortic valve Vmax measures 3.14 m/s. Dimensionless index 0.29.  8. Moderate pleural effusion.  9. Compared to previous transthoracic and transesophageal studies in 04/2021, LVEF is reduced from 40-45%. Regional wall motion abnormalities (LAD  territory) are more pronounced Mild RV dysfunctinon is new. Valvular findings are relatively unchanged. FINDINGS  Left Ventricle: Left ventricular ejection fraction, by estimation, is 25 to 30%. The left ventricle has severely decreased function. The left ventricle demonstrates regional wall motion abnormalities. Severe hypokinesis of the left ventricular, mid-apical anteroseptal wall, anterior wall and apical segment. Definity contrast agent was given IV to delineate the left ventricular endocardial borders. The left ventricular internal cavity size was normal in size. There is no left ventricular hypertrophy. Left ventricular diastolic function could not be evaluated. Right Ventricle: The right ventricular size is normal. No increase in right ventricular wall thickness. Right ventricular systolic function is mildly reduced. There is normal pulmonary artery systolic pressure. The tricuspid regurgitant velocity is 2.14 m/s, and with an assumed right atrial pressure of 15 mmHg, the estimated right ventricular systolic pressure is 33.0 mmHg. Left Atrium: Left atrial size was mildly dilated. Right Atrium: Right atrial size was mildly dilated. Pericardium: There is no evidence of pericardial effusion. Mitral Valve: The mitral valve is abnormal. There is moderate calcification of the mitral valve leaflet(s). Moderate mitral annular calcification. Moderate mitral valve regurgitation. Moderate mitral valve stenosis. MV peak gradient, 13.9 mmHg. The mean mitral valve gradient is 9.0 mmHg. Tricuspid Valve: The tricuspid valve is grossly normal. Tricuspid valve regurgitation is mild to moderate. Aortic Valve: The aortic valve is tricuspid. There is moderate calcification of the aortic valve. Aortic valve regurgitation is moderate. Aortic regurgitation PHT measures 244 msec. Aortic valve mean gradient measures 22.0 mmHg. Aortic valve peak gradient measures  39.6 mmHg. Aortic valve area, by VTI measures 0.87 cm. Pulmonic Valve:  The pulmonic valve was grossly normal. Pulmonic valve regurgitation is mild. Aorta: The aortic root and ascending aorta are structurally normal, with no evidence of dilitation. IAS/Shunts: The interatrial septum was not assessed. Additional Comments: There is a moderate pleural effusion.  LEFT VENTRICLE PLAX 2D LVIDd:         4.80 cm   Diastology LVIDs:         4.00 cm   LV e' medial:    5.44 cm/s LV PW:         0.80 cm   LV E/e' medial:  32.2 LV IVS:        0.80 cm   LV e' lateral:   6.31 cm/s LVOT diam:     2.00 cm   LV E/e' lateral: 27.7 LV SV:         48 LV SV Index:   24 LVOT Area:     3.14 cm  RIGHT VENTRICLE            IVC RV Basal diam:  4.40 cm    IVC diam: 2.30 cm RV Mid diam:    3.90 cm RV S prime:     8.27 cm/s TAPSE (M-mode): 1.6 cm LEFT ATRIUM             Index        RIGHT ATRIUM           Index LA diam:        4.20 cm 2.11 cm/m   RA Area:     18.30 cm LA Vol (A2C):   79.4 ml 39.84 ml/m  RA Volume:   55.10 ml  27.65 ml/m LA Vol (A4C):   68.5 ml 34.37 ml/m LA Biplane Vol: 74.4 ml 37.33 ml/m  AORTIC VALVE AV Area (Vmax):    0.90 cm AV Area (Vmean):   0.85 cm AV Area (VTI):     0.87 cm AV Vmax:           314.50 cm/s AV Vmean:          220.500 cm/s AV VTI:            0.552 m AV Peak Grad:      39.6 mmHg AV Mean Grad:      22.0 mmHg LVOT Vmax:         90.00 cm/s LVOT Vmean:        60.000 cm/s LVOT VTI:          0.154 m LVOT/AV VTI ratio: 0.28 AI PHT:            244 msec  AORTA Ao Root diam: 2.60 cm Ao Asc diam:  2.90 cm MITRAL VALVE                TRICUSPID VALVE MV Area (PHT): 3.42 cm     TR Peak grad:   18.3 mmHg MV Area VTI:   1.37 cm     TR Vmax:        214.00 cm/s MV Peak grad:  13.9 mmHg MV Mean grad:  9.0 mmHg     SHUNTS MV Vmax:       1.87 m/s     Systemic VTI:  0.15 m MV Vmean:      138.0 cm/s   Systemic Diam: 2.00 cm MV Decel Time: 222 msec MR Peak grad: 117.7 mmHg MR Mean grad: 73.0 mmHg MR Vmax:  542.50 cm/s MR Vmean:     401.0 cm/s MV E velocity: 175.00 cm/s MV A velocity:  162.00 cm/s MV E/A ratio:  1.08 Manish Patwardhan MD Electronically signed by Vernell Leep MD Signature Date/Time: 06/19/2021/12:08:57 PM    Final     Scheduled Meds:  aspirin EC  81 mg Oral Daily   clopidogrel  75 mg Oral Daily   enoxaparin (LOVENOX) injection  40 mg Subcutaneous Q24H   furosemide  40 mg Intravenous BID   insulin aspart  0-9 Units Subcutaneous TID WC   ivabradine  5 mg Oral BID WC   rosuvastatin  20 mg Oral Daily   Continuous Infusions:   LOS: 1 day   Raiford Noble, DO [facility] Available via Epic secure chat 7am-7pm After these hours, please refer to coverage provider listed on [email or web address] 06/19/2021, 7:10 PM

## 2021-06-19 NOTE — Plan of Care (Signed)
?  Problem: Education: ?Goal: Ability to demonstrate management of disease process will improve ?Outcome: Progressing ?  ?Problem: Activity: ?Goal: Capacity to carry out activities will improve ?Outcome: Progressing ?  ?Problem: Cardiac: ?Goal: Ability to achieve and maintain adequate cardiopulmonary perfusion will improve ?Outcome: Progressing ?  ?

## 2021-06-19 NOTE — Hospital Course (Addendum)
HPI per Dr. Ileene Musa FINNLEY LARUSSO is a 76 y.o. male with medical history significant of uncontrolled type 2 diabetes, hypertension, hyperlipidemia, PVD, osteomyelitis, MSSA bacteremia who presents with concerns of increasing shortness of breath.   Reports for the past 3 days he has noticed progressive increasing shortness of breath with exertion.  Also feels very edematous with lower extremity swelling.  Denies any chest pain.  No cough or fever.  Patient was just discharged from rehab several days ago following a prolonged hospital course.   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  Later found to have MSSA bacteremia, E. 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  TEE on 1/16 shows no vegetation, EF of 60 to 65% with moderate calcified mitral stenosis.  Also later discovered to have osteomyelitis of the left fourth distal phalanx. Initially treated on cefepime but right hand jerking and was discontinued by neurology.  Ultimately was placed on IV cefazolin and oral ciprofloxacin per ID and completed on 06/14/2021.   In the ED, he was afebrile but hypoxic requiring 2 L via nasal cannula.  No leukocytosis sodium of 129, K of 4.1, creatinine of 1.56 from prior of 1.36.  BNP of 2098.  Troponin of 45 and 33.   EKG on my review shows right axis deviation with intraventricular conduction delay.   **Interim History Cardiology was consulted and patient was found to have heart failure reduced ejection fraction with regional wall motion abnormalities and a mild right ventricular dysfunction that was new.  Given his comorbidities cardiology recommended Lasix IV twice daily and adding [name] nor 5 mg twice daily and holding beta-blocker due to decompensated heart failure and adding [place].  They are recommending a nuclear stress test to evaluate for ischemia  versus infarction on Monday, 06/21/2021 and then performing a right and left heart cath and coronary angiography on 06/22/2021.  Given that his renal function is slightly better today cardiology is going to forego his nuclear medicine scan and recommending a cardiac MRI with gadolinium contrast to assess viable myocardium prior to performing cardiac catheterization.  Continues to be diuresed but continues to be short of breath.  Given that he has bilateral pleural effusions we will consult interventional radiology for thoracentesis and he had a right-sided thoracentesis that yielded 2.4 L of amber-colored fluid and will do the left side tomorrow.  PT OT recommending home health  06/21/2021: He has not reduced his Lasix from IV 40 mg twice daily to IV 40 mg daily and added bisoprolol 2.5 mg today  06/22/2021: Patient underwent cardiac catheterization today which showed that he had severe disease so cardiology has consulted cardiothoracic surgery for further evaluation and have discontinued his Plavix for now.  Cardiology recommends continued optimization for his heart failure with reduced ejection fraction.  The patient was seen by the cardiothoracic surgery team and he was felt to be a high risk coronary arterial surgical revascularization candidate but the cardiothoracic surgeon will determine feasibility of proceeding with surgical intervention at this time.

## 2021-06-19 NOTE — Assessment & Plan Note (Addendum)
-  Start sensitive SSI for now and will adjust -CBGs ranging from 159-235

## 2021-06-19 NOTE — Progress Notes (Signed)
Echocardiogram ?2D Echocardiogram has been performed. ? ?Arlyss Gandy ?06/19/2021, 10:35 AM ?

## 2021-06-19 NOTE — Consult Note (Signed)
CARDIOLOGY CONSULT NOTE  Patient ID: Zachary [name] MRN: [id number] DOB/AGE: 06/24/1945 76 y.o.  Admit date: 06/18/2021 Referring Physician: Zacarias Pontes, ER[facility] Primary Physician: Thedora Hinders, NP Reason for Consultation: Congestive heart failure  HPI:  76 y.o. Caucasian male  with hypertension, uncontrolled type 2 diabetes mellitus, peripheral artery disease, h/o osteomyelitis s/p right foot toe amputation, nonhealing wound left foot toe, h/o epidural abscess and MSSA bacteremia 04/2021, critical limb ischemia, moderate mitral and aortic stenosis, now admitted with congestive heart failure.  Patient's wife and her friend, who is also a healthcare provider with radiology present in the room.  Patient had a prolonged hospitalization in 04/2021.  He was treated with IV cefepime through PICC line for MSSA bacteremia and epidural abscess, after which he spent several weeks in rehab/nursing home.  He had just returned home about a week ago, but developed severe dyspnea, orthopnea, negative symptoms.  He also reported intermittent chest pressure episodes.  He had 1 profound episode about a month ago while in the facility, which was not addressed with any hospitalization.  Patient is currently resting in bed, with no dyspnea on 2 L oxygen.  Diabetes has been uncontrolled.  Diet has been unhealthy, including sugary drinks and food.  Past Medical History:  Diagnosis Date   AKI (acute kidney injury) ([place]) 06/18/2021   Diabetes mellitus    Hypercholesteremia    Hypertension    Patella fracture    left   PVD (peripheral vascular disease) ([place])      Past Surgical History:  Procedure Laterality Date   AMPUTATION     AMPUTATION  11/28/2011   Procedure: AMPUTATION RAY;  Surgeon: [name], MD;  Location: [facility];  Service: Orthopedics;  Laterality: Left;  left 2nd toe amputation   AMPUTATION TOE Right 09/14/2019   Procedure: AMPUTATION TOE RIGHT SECOND TOE;  Surgeon: [name], [name];  Location: [facility];  Service: Podiatry;  Laterality: Right;   AMPUTATION TOE Right 04/20/2021   Procedure: AMPUTATION GREAT TOE; APPLICATION OF SKIN GRAFT SUBSTITUTE;  Surgeon: [name], [name];  Location: [facility];  Service: Podiatry;  Laterality: Right;   BUBBLE STUDY  04/26/2021   Procedure: BUBBLE STUDY;  Surgeon: Geralynn Rile, MD;  Location: [facility];  Service: Cardiovascular;;   IR ANGIOGRAM EXTREMITY BILATERAL  03/03/2021   IR ANGIOGRAM EXTREMITY LEFT  05/14/2021   IR ANGIOGRAM EXTREMITY RIGHT  03/03/2021   IR ANGIOGRAM EXTREMITY RIGHT  04/14/2021   IR FEM POP ART PTA MOD SED  03/03/2021   IR FEM POP INC ATHEREC / STENT / PTA MOD SED  05/14/2021   IR INTRAVASCULAR ULTRASOUND NON CORONARY  03/03/2021   IR RADIOLOGIST EVAL & MGMT  09/27/2019   IR RADIOLOGIST EVAL & MGMT  12/18/2019   IR RADIOLOGIST EVAL & MGMT  02/11/2021   IR RADIOLOGIST EVAL & MGMT  04/09/2021   IR TIB-PERO ART ATHEREC INC PTA MOD SED  03/03/2021   IR TIB-PERO ART PTA MOD SED  04/14/2021   IR US GUIDE VASC ACCESS LEFT  03/03/2021   IR US GUIDE VASC ACCESS RIGHT  04/14/2021   IR US GUIDE VASC ACCESS RIGHT  05/14/2021   ORIF PATELLA Left 01/10/2019   Procedure: OPEN REDUCTION INTERNAL (ORIF) FIXATION LEFT PATELLA FRACTURE;  Surgeon: [name], MD;  Location: [facility];  Service: Orthopedics;  Laterality: Left;   TEE WITHOUT CARDIOVERSION N/A 04/26/2021   Procedure: TRANSESOPHAGEAL ECHOCARDIOGRAM (TEE);  Surgeon: Geralynn Rile,  MD;  Location: [facility];  Service: Cardiovascular;  Laterality: N/A;     No family history on file.   Social History: Social History   Socioeconomic History   Marital status: Married    Spouse name: Not on file   Number of children: Not on file   Years of education: Not on file   Highest education level: Not on file  Occupational History   Not on file  Tobacco Use   Smoking status: Never   Smokeless tobacco: Never  Substance and Sexual Activity    Alcohol use: No   Drug use: No   Sexual activity: Not on file  Other Topics Concern   Not on file  Social History Narrative   Not on file   Social Determinants of Health   Financial Resource Strain: Not on file  Food Insecurity: Not on file  Transportation Needs: Not on file  Physical Activity: Not on file  Stress: Not on file  Social Connections: Not on file  Intimate Partner Violence: Not on file     Medications Prior to Admission  Medication Sig Dispense Refill Last Dose   acetaminophen (TYLENOL) 500 MG tablet Take 1,000 mg by mouth See admin instructions. Take 1000 mg 3 times daily, may take 1000 mg every 8 hours as needed for mild/moderate pain      aspirin EC 81 MG tablet Take 81 mg by mouth daily.      B-D ULTRAFINE III SHORT PEN 31G X 8 MM MISC Inject into the skin 5 (five) times daily.      calcium carbonate (TUMS - DOSED IN MG ELEMENTAL CALCIUM) 500 MG chewable tablet Chew 1,000 mg by mouth daily as needed for indigestion or heartburn.      ciprofloxacin (CIPRO) 500 MG tablet Take 1 tablet (500 mg total) by mouth 2 (two) times daily. (Patient not taking: Reported on 06/15/2021)      clopidogrel (PLAVIX) 75 MG tablet Take 75 mg by mouth daily.      diltiazem (CARDIZEM CD) 120 MG 24 hr capsule Take 120 mg by mouth daily. (Patient not taking: Reported on 06/15/2021)      diltiazem (CARDIZEM LA) 180 MG 24 hr tablet Take 180 mg by mouth daily.      famotidine (PEPCID) 20 MG tablet Take 20 mg by mouth every evening.      furosemide (LASIX) 80 MG tablet Take 80 mg by mouth daily.      glucose blood (FREESTYLE LITE) test strip TEST 3 TIMES A DAY E11.9      insulin aspart (NOVOLOG) 100 UNIT/ML injection Inject 3 Units into the skin 3 (three) times daily with meals. 10 mL 11    insulin glargine (LANTUS SOLOSTAR) 100 UNIT/ML Solostar Pen Inject 24 Units into the skin at bedtime.      lactulose (CHRONULAC) 10 GM/15ML solution Take 30 mLs (20 g total) by mouth 2 (two) times daily as  needed for mild constipation or moderate constipation. 236 mL 0    Lancets (FREESTYLE) lancets AS DIRECTED THREE TIMES A DAY      nitroGLYCERIN (NITROSTAT) 0.4 MG SL tablet Place 0.4 mg under the tongue every 5 (five) minutes as needed for chest pain.      NON FORMULARY Apply 1 application topically 2 (two) times daily. Skin Prep, apply to bilateral heels      pantoprazole (PROTONIX) 40 MG tablet Take 40 mg by mouth daily.      polyethylene glycol (MIRALAX / GLYCOLAX) 17 g  packet Take 17 g by mouth 2 (two) times daily. 14 each 0    POTASSIUM CHLORIDE ER PO Take 20 mEq by mouth 2 times daily at 12 noon and 4 pm.      Povidone-Iodine (BETADINE EX) Apply 1 application topically 2 (two) times daily. To left foot and left foot 4th digit      senna-docusate (SENOKOT-S) 8.6-50 MG tablet Take 2 tablets by mouth 2 (two) times daily.       Review of Systems  Cardiovascular:  Positive for chest pain (Currently absent), dyspnea on exertion and leg swelling. Negative for palpitations and syncope.     Physical Exam: Physical Exam Vitals and nursing note reviewed.  Constitutional:      General: He is not in acute distress. Neck:     Vascular: No JVD.  Cardiovascular:     Rate and Rhythm: Normal rate and regular rhythm.     Pulses:          Dorsalis pedis pulses are 0 on the right side and 0 on the left side.       Posterior tibial pulses are 0 on the right side and 0 on the left side.     Heart sounds: Normal heart sounds. No murmur heard.    Comments: Healing wound s/p amputation right foot. Nonhealing wound base of third toe left foot. Pulmonary:     Effort: Pulmonary effort is normal.     Breath sounds: Normal breath sounds. No wheezing or rales.  Musculoskeletal:     Right lower leg: Edema (2+) present.     Left lower leg: Edema (2+) present.       Lab Results: Reviewed and interpreted: BMP, BNP, trop HS  Imaging/tests reviewed and independently interpreted:  Cardiac  Studies:  Telemetry 06/19/2021: No significant arrhythmia  EKG 06/19/2021: Sinus tachycardia Anterior infarct, old  Echocardiogram 06/19/2021:  1. Left ventricular ejection fraction, by estimation, is 25 to 30%. The  left ventricle has severely decreased function. The left ventricle  demonstrates regional wall motion abnormalities (see scoring  diagram/findings for description). Left ventricular  diastolic function could not be evaluated. There is severe hypokinesis of  the left ventricular, mid-apical anteroseptal wall, anterior wall and  apical segment.   2. Right ventricular systolic function is mildly reduced. The right  ventricular size is normal. There is normal pulmonary artery systolic  pressure.   3. Left atrial size was mildly dilated.   4. Right atrial size was mildly dilated.   5. The mitral valve is abnormal. Moderate mitral valve regurgitation.  Moderate mitral stenosis. Moderate mitral annular calcification. Mean PG 9  mmHg, MVA 1.34 cm2 by PHT method, likely more prominent due to resting  tachycardia around 104 bpm.   6. Tricuspid valve regurgitation is mild to moderate.   7. The aortic valve is tricuspid. There is moderate calcification of the  aortic valve. Aortic valve regurgitation is moderate. Aortic valve area,  by VTI measures 0.87 cm. Aortic valve mean gradient measures 22.0 mmHg.  Aortic valve Vmax measures 3.14  m/s. Dimensionless index 0.29.   8. Moderate pleural effusion.   9. Compared to previous transthoracic and transesophageal studies in  04/2021, LVEF is reduced from 40-45%. Regional wall motion abnormalities  (LAD territory) are more pronounced Mild RV dysfunctinon is new. Valvular  findings are relatively unchanged.   Echocardiogram 04/26/2021: 1. Left ventricular ejection fraction, by estimation, is 40 to 45%. The  left ventricle has mildly decreased function. The left ventricle  demonstrates regional wall motion abnormalities (see scoring   diagram/findings for description). Left ventricular  diastolic parameters are indeterminate.   2. Right ventricular systolic function is normal. The right ventricular  size is normal. Tricuspid regurgitation signal is inadequate for assessing  PA pressure.   3. Left atrial size was mildly dilated.   4. Right atrial size was mildly dilated.   5. The mitral valve is degenerative. No evidence of mitral valve  regurgitation. Mild to moderate mitral stenosis. The mean mitral valve  gradient is 6.0 mmHg with average heart rate of 88 bpm. Moderate to severe  mitral annular calcification.   6. The aortic valve is tricuspid. Aortic valve regurgitation is not  visualized. Moderate to severe aortic valve stenosis. Aortic valve mean  gradient measures 28.0 mmHg. Aortic valve Vmax measures 3.82 m/s.   Conclusion(s)/Recommendation(s): No hemodynamically significant valvular  heart disease.   Assessment & Recommendations:  76 y.o. Caucasian male  with hypertension, uncontrolled type 2 diabetes mellitus, peripheral artery disease, h/o osteomyelitis s/p right foot toe amputation, nonhealing wound left foot toe, h/o epidural abscess and MSSA bacteremia 04/2021, critical limb ischemia, moderate mitral and aortic stenosis, now admitted with congestive heart failure.  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  I recommend he probably had a myocardial infarction episode about a month ago while in nursing home. I personally reviewed and compared with prior transthoracic and transesophageal echocardiogram in 04/2021. Regional wall motion abnormalities in LAD territory are new, LVEF is significantly reduced, mild RV dysfunction is new. Moderate mitral stenosis and aortic stenosis relatively unchanged and unlikely to be primary cause for his cardiomyopathy. He will need evaluation for obstructive coronary artery disease.  This has to be timed in relation to his renal dysfunction  issues. I recommend nuclear stress testing to evaluate for ischemia versus infarction-on Monday, 06/21/2021, and then perform left and right heart catheterization and coronary angiography with possibility of intervention on 06/22/2021. Given his type 2 diabetes mellitus and LV dysfunction, CABG would be recommended should he have severe obstructive multivessel disease with viable myocardium.  However, this can be challenging given his history of osteomyelitis, current nonhealing wound that could require amputation and left foot.  We will discuss more as we go along. Recommend IV Lasix 40 mg twice daily at this time. We will add Corlanor 5 mg twice daily. Holding of beta-blocker due to decompensated heart failure. Holding of ARB/ARNI given renal dysfunction and possibility of upcoming coronary angiography in the near future. Will add Farxiga after heart catheterization.  CAD: Suspect obstructive coronary artery disease, at least in LAD territory. Troponin elevation likely reflective of type II MI in the setting of heart failure, and possibly resting ischemia in the setting of suspected obstructive coronary artery disease.  No acute coronary syndrome. Continue aspirin, Plavix. Needs aggressive management of type 2 diabetes mellitus.  Long discussion with the patient regarding dietary modifications.  Defer management of hyperglycemia to primary team. Check lipid panel. Ordered Crestor 20 mg.  He reportedly has myalgias with pravastatin in the past if intolerant to Crestor, will need to consider Repatha.  Hyponatremia: Likely delusional in the setting of decompensated heart failure. Continue diuresis.  PAD: Absent pedal pulses on exam. Follows up with Dr. [name] with vascular interventional radiology. Plan for repeat angiography per Dr. [name] prior to anticipated amputation of left foot toes   Extensive review of prior imaging, including transthoracic and transesophageal echocardiogram from  04/2021 and comparing with transthoracic echocardiogram from today.  Complex decision making and discussion with the patient and family and primary team.  Time spent: 110 min  Nigel Mormon, MD Pager: [phone] Office: [phone]

## 2021-06-20 ENCOUNTER — Inpatient Hospital Stay (HOSPITAL_COMMUNITY): Payer: Medicare PPO

## 2021-06-20 DIAGNOSIS — I5021 Acute systolic (congestive) heart failure: Secondary | ICD-10-CM | POA: Diagnosis not present

## 2021-06-20 DIAGNOSIS — M8660 Other chronic osteomyelitis, unspecified site: Secondary | ICD-10-CM | POA: Diagnosis not present

## 2021-06-20 DIAGNOSIS — J9601 Acute respiratory failure with hypoxia: Secondary | ICD-10-CM | POA: Diagnosis not present

## 2021-06-20 DIAGNOSIS — N179 Acute kidney failure, unspecified: Secondary | ICD-10-CM | POA: Diagnosis not present

## 2021-06-20 LAB — COMPREHENSIVE METABOLIC PANEL
ALT: 9 U/L (ref 0–44)
AST: 18 U/L (ref 15–41)
Albumin: 2.9 g/dL — ABNORMAL LOW (ref 3.5–5.0)
Alkaline Phosphatase: 55 U/L (ref 38–126)
Anion gap: 8 (ref 5–15)
BUN: 26 mg/dL — ABNORMAL HIGH (ref 8–23)
CO2: 34 mmol/L — ABNORMAL HIGH (ref 22–32)
Calcium: 8.6 mg/dL — ABNORMAL LOW (ref 8.9–10.3)
Chloride: 88 mmol/L — ABNORMAL LOW (ref 98–111)
Creatinine, Ser: 1.73 mg/dL — ABNORMAL HIGH (ref 0.61–1.24)
GFR, Estimated: 40 mL/min — ABNORMAL LOW (ref >60)
Glucose, Bld: 196 mg/dL — ABNORMAL HIGH (ref 70–99)
Potassium: 3.9 mmol/L (ref 3.5–5.1)
Sodium: 130 mmol/L — ABNORMAL LOW (ref 135–145)
Total Bilirubin: 0.6 mg/dL (ref 0.3–1.2)
Total Protein: 6.3 g/dL — ABNORMAL LOW (ref 6.5–8.1)

## 2021-06-20 LAB — CBC WITH DIFFERENTIAL/PLATELET
Abs Immature Granulocytes: 0.1 10*3/uL — ABNORMAL HIGH (ref 0.00–0.07)
Basophils Absolute: 0.1 10*3/uL (ref 0.0–0.1)
Basophils Relative: 1 %
Eosinophils Absolute: 0.1 10*3/uL (ref 0.0–0.5)
Eosinophils Relative: 2 %
HCT: 40.4 % (ref 39.0–52.0)
Hemoglobin: 13.5 g/dL (ref 13.0–17.0)
Immature Granulocytes: 1 %
Lymphocytes Relative: 9 %
Lymphs Abs: 0.8 10*3/uL (ref 0.7–4.0)
MCH: 28.9 pg (ref 26.0–34.0)
MCHC: 33.4 g/dL (ref 30.0–36.0)
MCV: 86.5 fL (ref 80.0–100.0)
Monocytes Absolute: 0.7 10*3/uL (ref 0.1–1.0)
Monocytes Relative: 8 %
Neutro Abs: 6.5 10*3/uL (ref 1.7–7.7)
Neutrophils Relative %: 79 %
Platelets: 200 10*3/uL (ref 150–400)
RBC: 4.67 MIL/uL (ref 4.22–5.81)
RDW: 13.9 % (ref 11.5–15.5)
WBC: 8.2 10*3/uL (ref 4.0–10.5)
nRBC: 0 % (ref 0.0–0.2)

## 2021-06-20 LAB — LIPID PANEL
Cholesterol: 155 mg/dL (ref 0–200)
HDL: 41 mg/dL (ref >40)
LDL Cholesterol: 97 mg/dL (ref 0–99)
Total CHOL/HDL Ratio: 3.8 RATIO
Triglycerides: 84 mg/dL (ref <150)
VLDL: 17 mg/dL (ref 0–40)

## 2021-06-20 LAB — BODY FLUID CELL COUNT WITH DIFFERENTIAL
Lymphs, Fluid: 8 %
Monocyte-Macrophage-Serous Fluid: 5 % — ABNORMAL LOW (ref 50–90)
Neutrophil Count, Fluid: 87 % — ABNORMAL HIGH (ref 0–25)
Total Nucleated Cell Count, Fluid: 893 cu mm (ref 0–1000)

## 2021-06-20 LAB — IRON AND TIBC
Iron: 26 ug/dL — ABNORMAL LOW (ref 45–182)
Saturation Ratios: 9 % — ABNORMAL LOW (ref 17.9–39.5)
TIBC: 274 ug/dL (ref 250–450)
UIBC: 248 ug/dL

## 2021-06-20 LAB — RETICULOCYTES
Immature Retic Fract: 8.8 % (ref 2.3–15.9)
RBC.: 4.63 MIL/uL (ref 4.22–5.81)
Retic Count, Absolute: 71.8 10*3/uL (ref 19.0–186.0)
Retic Ct Pct: 1.6 % (ref 0.4–3.1)

## 2021-06-20 LAB — FERRITIN: Ferritin: 391 ng/mL — ABNORMAL HIGH (ref 24–336)

## 2021-06-20 LAB — PROTEIN, PLEURAL OR PERITONEAL FLUID: Total protein, fluid: <3 g/dL

## 2021-06-20 LAB — GLUCOSE, PLEURAL OR PERITONEAL FLUID: Glucose, Fluid: 204 mg/dL

## 2021-06-20 LAB — LACTATE DEHYDROGENASE, PLEURAL OR PERITONEAL FLUID: LD, Fluid: 121 U/L — ABNORMAL HIGH (ref 3–23)

## 2021-06-20 LAB — GLUCOSE, CAPILLARY
Glucose-Capillary: 156 mg/dL — ABNORMAL HIGH (ref 70–99)
Glucose-Capillary: 226 mg/dL — ABNORMAL HIGH (ref 70–99)
Glucose-Capillary: 180 mg/dL — ABNORMAL HIGH (ref 70–99)
Glucose-Capillary: 235 mg/dL — ABNORMAL HIGH (ref 70–99)

## 2021-06-20 LAB — VITAMIN B12: Vitamin B-12: 205 pg/mL (ref 180–914)

## 2021-06-20 LAB — PHOSPHORUS: Phosphorus: 4.4 mg/dL (ref 2.5–4.6)

## 2021-06-20 LAB — FOLATE: Folate: 22.4 ng/mL (ref >5.9)

## 2021-06-20 LAB — MAGNESIUM: Magnesium: 1.8 mg/dL (ref 1.7–2.4)

## 2021-06-20 MED ORDER — LIDOCAINE HCL (PF) 1 % IJ SOLN
INTRAMUSCULAR | Status: AC
  Filled 2021-06-20: qty 30

## 2021-06-20 MED ORDER — MAGNESIUM SULFATE 2 GM/50ML IV SOLN
2.0000 g | Freq: Once | INTRAVENOUS | Status: AC
  Administered 2021-06-20: 2 g via INTRAVENOUS
  Filled 2021-06-20: qty 50

## 2021-06-20 NOTE — Progress Notes (Signed)
PROGRESS NOTE    Zachary [name]  OZH:[id number] DOB: 1945/09/01 DOA: 06/18/2021 PCP: [name], FNP   Brief Narrative:  HPI per Dr. Ileene Musa Zachary [name] is a 76 y.o. male with medical history significant of uncontrolled type 2 diabetes, hypertension, hyperlipidemia, PVD, osteomyelitis, MSSA bacteremia who presents with concerns of increasing shortness of breath.   Reports for the past 3 days he has noticed progressive increasing shortness of breath with exertion.  Also feels very edematous with lower extremity swelling.  Denies any chest pain.  No cough or fever.  Patient was just discharged from rehab several days ago following a prolonged hospital course.   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  Later found to have MSSA bacteremia, E. 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  TEE on 1/16 shows no vegetation, EF of 60 to 65% with moderate calcified mitral stenosis.  Also later discovered to have osteomyelitis of the left fourth distal phalanx. Initially treated on cefepime but right hand jerking and was discontinued by neurology.  Ultimately was placed on IV cefazolin and oral ciprofloxacin per ID and completed on 06/14/2021.   In the ED, he was afebrile but hypoxic requiring 2 L via nasal cannula.  No leukocytosis sodium of 129, K of 4.1, creatinine of 1.56 from prior of 1.36.  BNP of 2098.  Troponin of 45 and 33.   EKG on my review shows right axis deviation with intraventricular conduction delay.   **Interim History Cardiology was consulted and patient was found to have heart failure reduced ejection fraction with regional wall motion abnormalities and a mild right ventricular dysfunction that was new.  Given his comorbidities cardiology recommended Lasix IV twice daily and adding [name] nor 5 mg twice daily and holding  beta-blocker due to decompensated heart failure and adding [place].  They are recommending a nuclear stress test to evaluate for ischemia versus infarction on Monday, 06/21/2021 and then performing a right and left heart cath and coronary angiography on 06/22/2021  Continues to be diuresed but continues to be short of breath.  Given that he has bilateral pleural effusions we will consult interventional radiology for thoracentesis and he had a right-sided thoracentesis that yielded 2.4 L of amber-colored fluid and will do the left side tomorrow.  PT OT recommending home health    Assessment and Plan: * Acute CHF (congestive heart failure) ([place]) -Found to have Systolic Dysfunction CXR with bilateral pleural effusion with hypoxia and LE edema on exam -Recent TEE on 1/16 shows no vegetation, EF of 60 to 65% with moderate calcified mitral stenosis. Pt recently completed 8 week course of IV cefazolin and Cipro on 3/6. No fever or leukocytosis to suggest ongoing infection. -Repeat ECHO showed an EF of 25 to 30% with severely decreased left ventricular function and left ventricular diastolic function could not be evaluated.  He has mild RV dysfunction as well that was new and valvular findings are relatively unchanged -Lasix IV '40mg'$  daily increased to BID and will continue   -strict intake and output, daily weights -Cardiology was consulted and they are recommending nuclear medicine stress test on Monday and cardiac cath on Tuesday -Cardiology recommends adding Corlanor 5 mg twice daily and holding beta-blocker due to decompensated heart failure and holding of ARB and ARNI given possible cath and recommending adding [place] and  after the cath -Plan is for nuclear medicine stress test tomorrow  Acute respiratory failure with hypoxia ([place]) -Secondary to new CHF exacerbation. Admit on 2L  -SpO2: 97 % O2 Flow Rate (L/min): 2 L/min -Has bilateral pleural effusions worse on the right compared to left and may  benefit from thoracentesis but he is on aspirin Plavix will need to confirm if this is okay with interventional radiology; They are able to do the Thoracentesis and he had 2.4 Liters removed from the Right and will have the Left one done tomorrow -Maintain O2 >92% and wean as tolerated with IV diuresis as above -We will need an ambulatory home O2 screen prior to discharge and a repeat chest x-ray -Continuous pulse oximetry maintain O2 saturation greater 90% -Continue supplemental oxygen via nasal cannula and wean to room air if able  AKI (acute kidney injury) ([place]) -Creatinine of 1.56 from prior of 1.36. Suspect more vascular congestion. Monitor closely with diuresis.  BUN/creatinine went up slightly and is now 24/1.62 yesterday and today is 26/1.73 -Avoid further nephrotoxic medications, contrast dyes, hypotension renally dose medications -Repeat CMP in a.m.   Chronic osteomyelitis ([place]) -Follows with Podiatry.  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  He has plans for transmetatarsal amputation of the left foot in April with podiatry.   Hyponatremia -Sodium of 129.  Suspect due to hypervolemia follow with repeat in the morning after diuresis. -Sodium is now slightly improved 130 x2 -Continue to monitor and trend and repeat CMP in a.m.  Peripheral vascular disease (HCC) -Continue aspirin and Plavix -Cards consulted   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 ([place]) -Start sensitive SSI for now and will adjust -CBGs ranging from 156-226   Essential hypertension -Continue Diltiazem and cardiology is adding: On holding beta-blocker for now -Continue to Monitor BP  DVT Prophylaxis: enoxaparin (LOVENOX) injection 40 mg Start: 06/18/21 2015    Code Status: Full Code Family Communication: No family at bedside but was updated via telephone by the Cardiology team  Disposition Plan:   Level of care: Telemetry Cardiac Status is: Inpatient Remains inpatient appropriate because: Needs NM Stress test   Consultants:  Cardiology  Procedures:  None  Antimicrobials:  Anti-infectives (From admission, onward)    None       Subjective: Seen and examined at bedside and was still feeling short of breath and was coughing.  Denies any lightheadedness or dizziness and thinks his legs are still swollen.  No nausea or vomiting.  No other concerns or  complaints this time  Objective: Vitals:   06/20/21 1100 06/20/21 1230 06/20/21 1252 06/20/21 1700  BP: 119/65 122/69 117/65 118/68  Pulse: 92   92  Resp: 20   19  Temp: 97.9 F (36.6 C)   98 F (36.7 C)  TempSrc: Oral   Oral  SpO2: 98%   94%  Weight:      Height:        Intake/Output Summary (Last 24 hours) at 06/20/2021 1831 Last data filed at 06/20/2021 1501 Gross per 24 hour  Intake 240 ml  Output 1950 ml  Net -1710 ml   Filed Weights   06/18/21 1521 06/18/21 2110 06/19/21 0401  Weight: 86.6 kg 86.4 kg 85.5 kg   Examination: Physical Exam:  Constitutional: WN/WD overweight chronically ill-appearing Caucasian male currently in mild distress and is coughing Respiratory: Diminished to auscultation bilaterally with coarse breath sounds and some  crackles and some slight rhonchi.  No appreciable rales or wheezing noted.  And patient is not tachypenic. No accessory muscle use.  Unlabored breathing but is wearing supplemental oxygen via nasal cannula Cardiovascular: RRR, no murmurs / rubs / gallops. S1 and S2 auscultated.  Has 1-2+ lower extremity edema Abdomen: Soft, non-tender, distended secondary body habitus. Bowel sounds positive.  GU: Deferred. Musculoskeletal: No clubbing / cyanosis of digits/nails.  Has toe amputations noted  Neurologic: CN 2-12 grossly intact with no focal deficits.   Data Reviewed: I have personally reviewed following labs and imaging studies  CBC: Recent Labs  Lab 06/18/21 1505  06/19/21 0125 06/20/21 0310  WBC 9.9 8.8 8.2  NEUTROABS 8.4* 6.7 6.5  HGB 12.9* 11.7* 13.5  HCT 38.5* 36.3* 40.4  MCV 86.9 88.1 86.5  PLT 192 184 703   Basic Metabolic Panel: Recent Labs  Lab 06/18/21 1643 06/19/21 0125 06/20/21 0310  NA 129* 130* 130*  K 4.1 3.9 3.9  CL 90* 91* 88*  CO2 27 29 34*  GLUCOSE 109* 90 196*  BUN 26* 24* 26*  CREATININE 1.56* 1.62* 1.73*  CALCIUM 8.5* 8.3* 8.6*  MG  --   --  1.8  PHOS  --   --  4.4   GFR: Estimated Creatinine Clearance: 38 mL/min (A) (by C-G formula based on SCr of 1.73 mg/dL (H)). Liver Function Tests: Recent Labs  Lab 06/19/21 0125 06/20/21 0310  AST 17 18  ALT 7 9  ALKPHOS 51 55  BILITOT 0.8 0.6  PROT 5.6* 6.3*  ALBUMIN 2.7* 2.9*   No results for input(s): LIPASE, AMYLASE in the last 168 hours. No results for input(s): AMMONIA in the last 168 hours. Coagulation Profile: No results for input(s): INR, PROTIME in the last 168 hours. Cardiac Enzymes: No results for input(s): CKTOTAL, CKMB, CKMBINDEX, TROPONINI in the last 168 hours. BNP (last 3 results) No results for input(s): PROBNP in the last 8760 hours. HbA1C: No results for input(s): HGBA1C in the last 72 hours. CBG: Recent Labs  Lab 06/19/21 1619 06/19/21 2112 06/20/21 0549 06/20/21 1106 06/20/21 1719  GLUCAP 172* 152* 156* 226* 180*   Lipid Profile: Recent Labs    06/20/21 0310  CHOL 155  HDL 41  LDLCALC 97  TRIG 84  CHOLHDL 3.8   Thyroid Function Tests: No results for input(s): TSH, T4TOTAL, FREET4, T3FREE, THYROIDAB in the last 72 hours. Anemia Panel: Recent Labs    06/20/21 0310  VITAMINB12 205  FOLATE 22.4  FERRITIN 391*  TIBC 274  IRON 26*  RETICCTPCT 1.6   Sepsis Labs: No results for input(s): PROCALCITON, LATICACIDVEN in the last 168 hours.  Recent Results (from the past 240 hour(s))  Resp Panel by RT-PCR (Flu A&B, Covid) Nasopharyngeal Swab     Status: None   Collection Time: 06/18/21  3:17 PM   Specimen:  Nasopharyngeal Swab; Nasopharyngeal(NP) swabs in vial transport medium  Result Value Ref Range Status   SARS Coronavirus 2 by RT PCR NEGATIVE NEGATIVE Final    Comment: (NOTE) SARS-CoV-2 target nucleic acids are NOT DETECTED.  The SARS-CoV-2 RNA is generally detectable in upper respiratory specimens during the acute phase of infection. The lowest concentration of SARS-CoV-2 viral copies this assay can detect is 138 copies/mL. A negative result does not preclude SARS-Cov-2 infection and should not be used as the sole basis for treatment or other patient management decisions. A negative result may occur with  improper specimen collection/handling, submission of specimen other than nasopharyngeal swab, presence  of viral mutation(s) within the areas targeted by this assay, and inadequate number of viral copies(<138 copies/mL). A negative result must be combined with clinical observations, patient history, and epidemiological information. The expected result is Negative.  Fact Sheet for Patients:  EntrepreneurPulse.com.au  Fact Sheet for Healthcare Providers:  IncredibleEmployment.be  This test is no t yet approved or cleared by the [place] FDA and  has been authorized for detection and/or diagnosis of SARS-CoV-2 by FDA under an Emergency Use Authorization (EUA). This EUA will remain  in effect (meaning this test can be used) for the duration of the COVID-19 declaration under Section 564(b)(1) of the Act, 21 U.S.C.section 360bbb-3(b)(1), unless the authorization is terminated  or revoked sooner.       Influenza A by PCR NEGATIVE NEGATIVE Final   Influenza B by PCR NEGATIVE NEGATIVE Final    Comment: (NOTE) The Xpert Xpress SARS-CoV-2/FLU/RSV plus assay is intended as an aid in the diagnosis of influenza from Nasopharyngeal swab specimens and should not be used as a sole basis for treatment. Nasal washings and aspirates are unacceptable for  Xpert Xpress SARS-CoV-2/FLU/RSV testing.  Fact Sheet for Patients: EntrepreneurPulse.com.au  Fact Sheet for Healthcare Providers: IncredibleEmployment.be  This test is not yet approved or cleared by the [place] FDA and has been authorized for detection and/or diagnosis of SARS-CoV-2 by FDA under an Emergency Use Authorization (EUA). This EUA will remain in effect (meaning this test can be used) for the duration of the COVID-19 declaration under Section 564(b)(1) of the Act, 21 U.S.C. section 360bbb-3(b)(1), unless the authorization is terminated or revoked.  Performed at [facility], [place] [address] [place], [place] [address]   Culture, blood (routine x 2)     Status: None (Preliminary result)   Collection Time: 06/18/21  5:15 PM   Specimen: BLOOD  Result Value Ref Range Status   Specimen Description BLOOD SITE NOT SPECIFIED  Final   Special Requests   Final    BOTTLES DRAWN AEROBIC AND ANAEROBIC Blood Culture adequate volume   Culture   Final    NO GROWTH 2 DAYS Performed at [facility], [place] [address] [place], [place] [address]    Report Status PENDING  Incomplete  Culture, blood (routine x 2)     Status: None (Preliminary result)   Collection Time: 06/18/21  5:20 PM   Specimen: BLOOD  Result Value Ref Range Status   Specimen Description BLOOD SITE NOT SPECIFIED  Final   Special Requests   Final    BOTTLES DRAWN AEROBIC AND ANAEROBIC Blood Culture adequate volume   Culture   Final    NO GROWTH 2 DAYS Performed at [facility], [place] [address], [place], [place] [address]    Report Status PENDING  Incomplete     Radiology Studies: DG Chest 1 View  Result Date: 06/20/2021 CLINICAL DATA:  Follow-up pleural effusions. Status post right thoracentesis. EXAM: CHEST  1 VIEW COMPARISON:  Prior today FINDINGS: Previously seen right pleural effusion has nearly completely resolved since prior study. No pneumothorax  visualized. Decreased atelectasis seen in the right lung base. Persistent consolidation or collapse is seen in the left lower lobe. Small left pleural effusion is unchanged. Pulmonary interstitial prominence remains stable, and mild interstitial edema cannot be excluded. Stable heart size. IMPRESSION: Near complete resolution of right pleural effusion. No pneumothorax visualized. Decreased right basilar atelectasis. Stable left lower lobe consolidation or collapse, and small left pleural effusion. Electronically Signed  By: Marlaine Hind M.D.   On: 06/20/2021 13:27   DG CHEST PORT 1 VIEW  Result Date: 06/20/2021 CLINICAL DATA:  Shortness of breath. EXAM: PORTABLE CHEST 1 VIEW COMPARISON:  06/19/2021 FINDINGS: The patient is rotated to the right on today's radiograph, reducing diagnostic sensitivity and specificity. Low lung volumes are present, causing crowding of the pulmonary vasculature. Continued obscuration of both hemidiaphragms with confluent bandlike density in the right mid lung probably from fluid in the minor fissure or perifissural airspace opacity. Overall this appearance is not substantially changed. Atherosclerotic calcification of the aortic arch. Thoracic spondylosis. IMPRESSION: 1. Stable appearance of bibasilar and right midlung airspace opacities. A component of these opacities is attributable to the moderate-sized bilateral pleural effusions shown on the lateral radiograph of 06/18/2021. 2. Low lung volumes. 3.  Aortic Atherosclerosis (ICD10-I70.0). Electronically Signed   By: Van Clines M.D.   On: 06/20/2021 09:17   DG CHEST PORT 1 VIEW  Result Date: 06/19/2021 CLINICAL DATA:  Congestive heart failure.  Shortness of breath EXAM: PORTABLE CHEST 1 VIEW COMPARISON:  Yesterday FINDINGS: Mildly degraded exam due to AP portable technique and patient body habitus. Midline trachea. Mild cardiomegaly. Atherosclerosis in the transverse aorta. Moderate right and small left pleural  effusions, increased. No pneumothorax. Interstitial prominence and indistinctness is similar. Right upper lobe airspace disease is slightly increased. Right greater than left base airspace disease is similar. IMPRESSION: Overall slightly worsened aeration, with increased interstitial edema and right greater than left airspace disease. The airspace disease could represent atelectasis or concurrent pneumonia. Enlargement of right larger than left pleural effusions. Aortic Atherosclerosis (ICD10-I70.0). Electronically Signed   By: Abigail Miyamoto M.D.   On: 06/19/2021 12:05   ECHOCARDIOGRAM COMPLETE  Result Date: 06/19/2021    ECHOCARDIOGRAM REPORT   Patient Name:   DENNY MCCREE Date of Exam: 06/19/2021 Medical Rec #:  [id number]       Height:       68.0 in Accession #:    [id number]      Weight:       188.5 lb Date of Birth:  1945/10/09       BSA:          1.993 m Patient Age:    80 years        BP:           128/77 mmHg Patient Gender: M               HR:           93 bpm. Exam Location:  Inpatient Procedure: 2D Echo and Intracardiac Opacification Agent Indications:    Dyspnea  History:        Patient has prior history of Echocardiogram examinations, most                 recent 04/20/2021. Risk Factors:Diabetes and Hypertension.  Sonographer:    Arlyss Gandy Referring Phys: [id number] Archer T TU IMPRESSIONS  1. Left ventricular ejection fraction, by estimation, is 25 to 30%. The left ventricle has severely decreased function. The left ventricle demonstrates regional wall motion abnormalities (see scoring diagram/findings for description). Left ventricular diastolic function could not be evaluated. There is severe hypokinesis of the left ventricular, mid-apical anteroseptal wall, anterior wall and apical segment.  2. Right ventricular systolic function is mildly reduced. The right ventricular size is normal. There is normal pulmonary artery systolic pressure.  3. Left atrial size was mildly dilated.  4. Right atrial  size was mildly  dilated.  5. The mitral valve is abnormal. Moderate mitral valve regurgitation. Moderate mitral stenosis. Moderate mitral annular calcification. Mean PG 9 mmHg, MVA 1.34 cm2 by PHT method, likely more prominent due to resting tachycardia around 104 bpm.  6. Tricuspid valve regurgitation is mild to moderate.  7. The aortic valve is tricuspid. There is moderate calcification of the aortic valve. Aortic valve regurgitation is moderate. Aortic valve area, by VTI measures 0.87 cm. Aortic valve mean gradient measures 22.0 mmHg. Aortic valve Vmax measures 3.14 m/s. Dimensionless index 0.29.  8. Moderate pleural effusion.  9. Compared to previous transthoracic and transesophageal studies in 04/2021, LVEF is reduced from 40-45%. Regional wall motion abnormalities (LAD territory) are more pronounced Mild RV dysfunctinon is new. Valvular findings are relatively unchanged. FINDINGS  Left Ventricle: Left ventricular ejection fraction, by estimation, is 25 to 30%. The left ventricle has severely decreased function. The left ventricle demonstrates regional wall motion abnormalities. Severe hypokinesis of the left ventricular, mid-apical anteroseptal wall, anterior wall and apical segment. Definity contrast agent was given IV to delineate the left ventricular endocardial borders. The left ventricular internal cavity size was normal in size. There is no left ventricular hypertrophy. Left ventricular diastolic function could not be evaluated. Right Ventricle: The right ventricular size is normal. No increase in right ventricular wall thickness. Right ventricular systolic function is mildly reduced. There is normal pulmonary artery systolic pressure. The tricuspid regurgitant velocity is 2.14 m/s, and with an assumed right atrial pressure of 15 mmHg, the estimated right ventricular systolic pressure is 23.5 mmHg. Left Atrium: Left atrial size was mildly dilated. Right Atrium: Right atrial size was mildly dilated.  Pericardium: There is no evidence of pericardial effusion. Mitral Valve: The mitral valve is abnormal. There is moderate calcification of the mitral valve leaflet(s). Moderate mitral annular calcification. Moderate mitral valve regurgitation. Moderate mitral valve stenosis. MV peak gradient, 13.9 mmHg. The mean mitral valve gradient is 9.0 mmHg. Tricuspid Valve: The tricuspid valve is grossly normal. Tricuspid valve regurgitation is mild to moderate. Aortic Valve: The aortic valve is tricuspid. There is moderate calcification of the aortic valve. Aortic valve regurgitation is moderate. Aortic regurgitation PHT measures 244 msec. Aortic valve mean gradient measures 22.0 mmHg. Aortic valve peak gradient measures 39.6 mmHg. Aortic valve area, by VTI measures 0.87 cm. Pulmonic Valve: The pulmonic valve was grossly normal. Pulmonic valve regurgitation is mild. Aorta: The aortic root and ascending aorta are structurally normal, with no evidence of dilitation. IAS/Shunts: The interatrial septum was not assessed. Additional Comments: There is a moderate pleural effusion.  LEFT VENTRICLE PLAX 2D LVIDd:         4.80 cm   Diastology LVIDs:         4.00 cm   LV e' medial:    5.44 cm/s LV PW:         0.80 cm   LV E/e' medial:  32.2 LV IVS:        0.80 cm   LV e' lateral:   6.31 cm/s LVOT diam:     2.00 cm   LV E/e' lateral: 27.7 LV SV:         48 LV SV Index:   24 LVOT Area:     3.14 cm  RIGHT VENTRICLE            IVC RV Basal diam:  4.40 cm    IVC diam: 2.30 cm RV Mid diam:    3.90 cm RV S prime:  8.27 cm/s TAPSE (M-mode): 1.6 cm LEFT ATRIUM             Index        RIGHT ATRIUM           Index LA diam:        4.20 cm 2.11 cm/m   RA Area:     18.30 cm LA Vol (A2C):   79.4 ml 39.84 ml/m  RA Volume:   55.10 ml  27.65 ml/m LA Vol (A4C):   68.5 ml 34.37 ml/m LA Biplane Vol: 74.4 ml 37.33 ml/m  AORTIC VALVE AV Area (Vmax):    0.90 cm AV Area (Vmean):   0.85 cm AV Area (VTI):     0.87 cm AV Vmax:           314.50 cm/s  AV Vmean:          220.500 cm/s AV VTI:            0.552 m AV Peak Grad:      39.6 mmHg AV Mean Grad:      22.0 mmHg LVOT Vmax:         90.00 cm/s LVOT Vmean:        60.000 cm/s LVOT VTI:          0.154 m LVOT/AV VTI ratio: 0.28 AI PHT:            244 msec  AORTA Ao Root diam: 2.60 cm Ao Asc diam:  2.90 cm MITRAL VALVE                TRICUSPID VALVE MV Area (PHT): 3.42 cm     TR Peak grad:   18.3 mmHg MV Area VTI:   1.37 cm     TR Vmax:        214.00 cm/s MV Peak grad:  13.9 mmHg MV Mean grad:  9.0 mmHg     SHUNTS MV Vmax:       1.87 m/s     Systemic VTI:  0.15 m MV Vmean:      138.0 cm/s   Systemic Diam: 2.00 cm MV Decel Time: 222 msec MR Peak grad: 117.7 mmHg MR Mean grad: 73.0 mmHg MR Vmax:      542.50 cm/s MR Vmean:     401.0 cm/s MV E velocity: 175.00 cm/s MV A velocity: 162.00 cm/s MV E/A ratio:  1.08 Manish Patwardhan MD Electronically signed by Vernell Leep MD Signature Date/Time: 06/19/2021/12:08:57 PM    Final    US THORACENTESIS ASP PLEURAL SPACE W/IMG GUIDE  Result Date: 06/20/2021 INDICATION: Congestive heart failure with bilateral pleural effusions. Request for therapeutic and diagnostic thoracentesis. EXAM: ULTRASOUND GUIDED RIGHT THORACENTESIS MEDICATIONS: 1% lidocaine 10 mL COMPLICATIONS: None immediate. PROCEDURE: An ultrasound guided thoracentesis was thoroughly discussed with the patient and questions answered. The benefits, risks, alternatives and complications were also discussed. The patient understands and wishes to proceed with the procedure. Written consent was obtained. Ultrasound was performed to localize and mark an adequate pocket of fluid in the right chest. The area was then prepped and draped in the normal sterile fashion. 1% Lidocaine was used for local anesthesia. Under ultrasound guidance a 6 Fr Safe-T-Centesis catheter was introduced. Thoracentesis was performed. The catheter was removed and a dressing applied. FINDINGS: A total of approximately 2.4 L of amber fluid was  removed. Samples were sent to the laboratory as requested by the clinical team. IMPRESSION: Successful ultrasound guided right thoracentesis yielding 2.4 L of pleural fluid.  No pneumothorax on post-procedure chest x-ray. Procedure performed by: Gareth Eagle, PA-C Electronically Signed   By: Markus Daft M.D.   On: 06/20/2021 14:40     Scheduled Meds:  aspirin EC  81 mg Oral Daily   clopidogrel  75 mg Oral Daily   enoxaparin (LOVENOX) injection  40 mg Subcutaneous Q24H   furosemide  40 mg Intravenous BID   insulin aspart  0-9 Units Subcutaneous TID WC   ivabradine  5 mg Oral BID WC   rosuvastatin  20 mg Oral Daily   Continuous Infusions:   LOS: 2 days   Raiford Noble, DO [facility] Available via Epic secure chat 7am-7pm After these hours, please refer to coverage provider listed on [email or web address] 06/20/2021, 6:31 PM

## 2021-06-20 NOTE — Progress Notes (Signed)
OT Cancellation Note ? ?Patient Details ?Name: Zachary [name] ?MRN: [id number] ?DOB: 1945/12/31 ? ? ?Cancelled Treatment:    Reason Eval/Treat Not Completed: Patient at procedure or test/ unavailable (Pt getting cleaned up by staff upon arrival, OT evaluation to re-attempt as appropriate.) ? ?Gwendolin Briel A Lucelia Lacey ?06/20/2021, 4:45 PM ?

## 2021-06-20 NOTE — Progress Notes (Signed)
Subjective:  ?Still has difficulty breathing ? ?Objective:  ?Vital Signs in the last 24 hours: ?Temp:  [97.5 ?F (36.4 ?C)-97.8 ?F (36.6 ?C)] 97.5 ?F (36.4 ?C) (03/12 9767) ?Pulse Rate:  [84-98] 85 (03/12 0814) ?Resp:  [17-20] 18 (03/12 0814) ?BP: (102-129)/(66-75) 119/66 (03/12 3419) ?SpO2:  [97 %-98 %] 97 % (03/12 0814) ? ?Intake/Output from previous day: ?03/11 0701 - 03/12 0700 ?In: 600 [P.O.:600] ?Out: 1125 [Urine:1125] ? ?Physical Exam ?Vitals and nursing note reviewed.  ?Constitutional:   ?   General: He is not in acute distress. ?Neck:  ?   Vascular: JVD present.  ?Cardiovascular:  ?   Rate and Rhythm: Normal rate and regular rhythm.  ?   Heart sounds: Normal heart sounds. No murmur heard. ?Pulmonary:  ?   Effort: Pulmonary effort is normal.  ?   Breath sounds: Examination of the right-lower field reveals decreased breath sounds. Examination of the left-lower field reveals decreased breath sounds. Decreased breath sounds present. No wheezing or rales.  ?Musculoskeletal:  ?   Right lower leg: Edema (1+) present.  ?   Left lower leg: Edema (1+) present.  ? ? ?Cardiac Studies: ? ?Reviewed and interpreted: ?BMP, BNP, trop HS ?  ?Imaging/tests reviewed and independently interpreted: ?  ?Cardiac Studies: ?  ?Telemetry 06/20/2021: ?No significant arrhythmia ?  ?EKG 06/19/2021: ?Sinus tachycardia ?Anterior infarct, old ?  ?Echocardiogram 06/19/2021: ? 1. Left ventricular ejection fraction, by estimation, is 25 to 30%. The  ?left ventricle has severely decreased function. The left ventricle  ?demonstrates regional wall motion abnormalities (see scoring  ?diagram/findings for description). Left ventricular  ?diastolic function could not be evaluated. There is severe hypokinesis of  ?the left ventricular, mid-apical anteroseptal wall, anterior wall and  ?apical segment.  ? 2. Right ventricular systolic function is mildly reduced. The right  ?ventricular size is normal. There is normal pulmonary artery systolic  ?pressure.   ? 3. Left atrial size was mildly dilated.  ? 4. Right atrial size was mildly dilated.  ? 5. The mitral valve is abnormal. Moderate mitral valve regurgitation.  ?Moderate mitral stenosis. Moderate mitral annular calcification. Mean PG 9  ?mmHg, MVA 1.34 cm2 by PHT method, likely more prominent due to resting  ?tachycardia around 104 bpm.  ? 6. Tricuspid valve regurgitation is mild to moderate.  ? 7. The aortic valve is tricuspid. There is moderate calcification of the  ?aortic valve. Aortic valve regurgitation is moderate. Aortic valve area,  ?by VTI measures 0.87 cm?Marland Kitchen Aortic valve mean gradient measures 22.0 mmHg.  ?Aortic valve Vmax measures 3.14  ?m/s. Dimensionless index 0.29.  ? 8. Moderate pleural effusion.  ? 9. Compared to previous transthoracic and transesophageal studies in  ?04/2021, LVEF is reduced from 40-45%. Regional wall motion abnormalities  ?(LAD territory) are more pronounced Mild RV dysfunctinon is new. Valvular  ?findings are relatively unchanged.  ?  ?Echocardiogram 04/26/2021: ?1. Left ventricular ejection fraction, by estimation, is 40 to 45%. The  ?left ventricle has mildly decreased function. The left ventricle  ?demonstrates regional wall motion abnormalities (see scoring  ?diagram/findings for description). Left ventricular  ?diastolic parameters are indeterminate.  ? 2. Right ventricular systolic function is normal. The right ventricular  ?size is normal. Tricuspid regurgitation signal is inadequate for assessing  ?PA pressure.  ? 3. Left atrial size was mildly dilated.  ? 4. Right atrial size was mildly dilated.  ? 5. The mitral valve is degenerative. No evidence of mitral valve  ?regurgitation. Mild to moderate mitral stenosis.  The mean mitral valve  ?gradient is 6.0 mmHg with average heart rate of 88 bpm. Moderate to severe  ?mitral annular calcification.  ? 6. The aortic valve is tricuspid. Aortic valve regurgitation is not  ?visualized. Moderate to severe aortic valve stenosis. Aortic  valve mean  ?gradient measures 28.0 mmHg. Aortic valve Vmax measures 3.82 m/s.  ? ?Conclusion(s)/Recommendation(s): No hemodynamically significant valvular  ?heart disease.  ?  ?Assessment & Recommendations: ?  ?76 y.o. Caucasian male  with hypertension, uncontrolled type 2 diabetes mellitus, peripheral artery disease, h/o osteomyelitis s/p right foot toe amputation, nonhealing wound left foot toe, h/o epidural abscess and MSSA bacteremia 04/2021, critical limb ischemia, moderate mitral and aortic stenosis, now admitted with congestive heart failure. ?  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  I recommend he probably had a myocardial infarction episode about a month ago while in nursing home. ?I personally reviewed and compared with prior transthoracic and transesophageal echocardiogram in 04/2021. ?Regional wall motion abnormalities in LAD territory are new, LVEF is significantly reduced, mild RV dysfunction is new. ?Moderate mitral stenosis and aortic stenosis relatively unchanged and unlikely to be primary cause for his cardiomyopathy. ?He will need evaluation for obstructive coronary artery disease.  This has to be timed in relation to his renal dysfunction issues. ?I recommend nuclear stress testing to evaluate for ischemia versus infarction-on Monday, 06/21/2021, and then perform left and right heart catheterization and coronary angiography with possibility of intervention on 06/22/2021. ?Given his type 2 diabetes mellitus and LV dysfunction, CABG would be recommended should he have severe obstructive multivessel disease with viable myocardium.  However, this can be challenging given his history of osteomyelitis, current nonhealing wound that could require amputation and left foot.  We will discuss more as we go along. ? ?Continue IV Lasix 40 mg twice daily. Cr increased from 1.62 to 1.73, which is not unexpected. Continue to monitor closely.  ?Continue Corlanor 5 mg twice  daily. ?Holding of beta-blocker due to decompensated heart failure. ?Holding of ARB/ARNI given renal dysfunction and possibility of upcoming coronary angiography in the near future. ?Will add Farxiga after heart catheterization. ?  ?Agree with consideration for thoracentesis given pleural effusions. Will need to see if radiology could perform this on Aspirin and plavix., Can hold if needed.  ? ?CAD: ?Suspect obstructive coronary artery disease, at least in LAD territory. ?Troponin elevation likely reflective of type II MI in the setting of heart failure, and possibly resting ischemia in the setting of suspected obstructive coronary artery disease.  No acute coronary syndrome. ?Continue aspirin, Plavix. ?Needs aggressive management of type 2 diabetes mellitus.  Long discussion with the patient regarding dietary modifications.  Defer management of hyperglycemia to primary team. ?Check lipid panel. ?Ordered Crestor 20 mg.  He reportedly has myalgias with pravastatin in the past if intolerant to Crestor, will need to consider Repatha. ?Will plan for nuclear stress testing on 06/21/2021.  ? ?Hyponatremia: ?Likely delusional in the setting of decompensated heart failure. ?Continue diuresis. ?  ?PAD: ?Absent pedal pulses on exam. ?Follows up with Dr. [name] with vascular interventional radiology. ?Plan for repeat angiography per Dr. [name] prior to anticipated amputation of left foot toes ?   ?Discussed with Dr. Alfredia Ferguson. ? ? ? ?Nigel Mormon, MD ?Pager: [phone] ?Office: [phone] ? ?

## 2021-06-20 NOTE — Procedures (Signed)
PROCEDURE SUMMARY: ? ?Successful US guided right thoracentesis. ?Yielded 2.4 L of amber fluid. ?Patient tolerated procedure well. ?No immediate complications. ?EBL = trace ? ?Specimen was sent for labs. ? ?Post procedure chest X-ray pending. ? ?*Recommend Left thoracentesis tomorrow* ? ?Murrell Redden PA-C ?06/20/2021 ?1:02 PM ? ? ? ?

## 2021-06-20 NOTE — Evaluation (Signed)
Physical Therapy Evaluation ?Patient Details ?Name: Zachary [name] ?MRN: [id number] ?DOB: 02-23-1946 ?Today's Date: 06/20/2021 ? ?History of Present Illness ? Patient is a 76 y/o male who presents on 3/10 with SOB, chest pain and BLE swelling. Found to have CHF exacerbation, s/p right thoracentesis 3/12. Plan for stress test Monday 06/21/21. PMH includes DM, HTN, PVD, left great toe amp, recently with long hospital stay in 04/2021 and d/ced from rehab (only home 2 days before being readmitted).  ?Clinical Impression ? Patient presents with chest pain, decreased activity tolerance, dyspnea on exertion and impaired mobility s/p above. Pt was only home from rehab for 2 days per wife before being re-admitted to hospital and was using RW for ambulation. Today, pt tolerated transfers and gait training with min guard assist and use of RW. Sp02 remained >90% on RA with activity but distance limited due to worsening chest pain on right side. Educated pt on IS usage. Will follow acutely to maximize independence and mobility and prevent hospital deconditioning prior to return home. Will likely need HHPT.  ?   ? ?Recommendations for follow up therapy are one component of a multi-disciplinary discharge planning process, led by the attending physician.  Recommendations may be updated based on patient status, additional functional criteria and insurance authorization. ? ?Follow Up Recommendations Home health PT ? ?  ?Assistance Recommended at Discharge Intermittent Supervision/Assistance  ?Patient can return home with the following ? A little help with walking and/or transfers;A little help with bathing/dressing/bathroom;Assistance with cooking/housework;Help with stairs or ramp for entrance;Assist for transportation ? ?  ?Equipment Recommendations None recommended by PT  ?Recommendations for Other Services ?    ?  ?Functional Status Assessment Patient has had a recent decline in their functional status and demonstrates the ability to  make significant improvements in function in a reasonable and predictable amount of time.  ? ?  ?Precautions / Restrictions Precautions ?Precautions: Fall;Other (comment) ?Precaution Comments: watch 02 ?Restrictions ?Weight Bearing Restrictions: No  ? ?  ? ?Mobility ? Bed Mobility ?Overal bed mobility: Needs Assistance ?Bed Mobility: Supine to Sit, Sit to Supine ?  ?  ?Supine to sit: Supervision, HOB elevated ?Sit to supine: HOB elevated, Supervision ?  ?General bed mobility comments: No assist needed. ?  ? ?Transfers ?Overall transfer level: Needs assistance ?Equipment used: Rolling walker (2 wheels) ?Transfers: Sit to/from Stand ?Sit to Stand: Min guard ?  ?  ?  ?  ?  ?General transfer comment: Min guard for safety. Stood from [employer]. ?  ? ?Ambulation/Gait ?Ambulation/Gait assistance: Min guard ?Gait Distance (Feet): 32 Feet ?Assistive device: Rolling walker (2 wheels) ?Gait Pattern/deviations: Step-through pattern, Decreased stride length ?Gait velocity: decreased ?Gait velocity interpretation: <1.31 ft/sec, indicative of household ambulator ?  ?General Gait Details: Slow, mostly steady gait with RW; chest pain worsened with mobility limiting distance. Sp02 remained >92% on RA. 2/4 DOE. ? ?Stairs ?  ?  ?  ?  ?  ? ?Wheelchair Mobility ?  ? ?Modified Rankin (Stroke Patients Only) ?  ? ?  ? ?Balance Overall balance assessment: Needs assistance ?Sitting-balance support: Feet supported, No upper extremity supported ?Sitting balance-Leahy Scale: Good ?  ?  ?Standing balance support: During functional activity, Reliant on assistive device for balance ?Standing balance-Leahy Scale: Poor ?  ?  ?  ?  ?  ?  ?  ?  ?  ?  ?  ?  ?   ? ? ? ?Pertinent Vitals/Pain Pain Assessment ?Pain Assessment: Faces ?Pain Score: 5  ?  Faces Pain Scale: Hurts even more ?Pain Location: right chest ?Pain Descriptors / Indicators: Grimacing, Guarding, Sore, Discomfort ?Pain Intervention(s): Monitored during session, Repositioned, Limited activity  within patient's tolerance  ? ? ?Home Living Family/patient expects to be discharged to:: Private residence ?Living Arrangements: Spouse/significant other ?Available Help at Discharge: Family;Available 24 hours/day ?Type of Home: House ?Home Access: Stairs to enter ?  ?Entrance Stairs-Number of Steps: 2 ?  ?Home Layout: One level ?Home Equipment: International aid/development worker (2 wheels) ?Additional Comments: son lives close and helps as needed  ?  ?Prior Function Prior Level of Function : Independent/Modified Independent ?  ?  ?  ?  ?  ?  ?Mobility Comments: Using RW since leaving rehab, was only home 2 nights. ?  ?  ? ? ?Hand Dominance  ? Dominant Hand: Right ? ?  ?Extremity/Trunk Assessment  ? Upper Extremity Assessment ?Upper Extremity Assessment: Defer to OT evaluation ?  ? ?Lower Extremity Assessment ?Lower Extremity Assessment: Overall WFL for tasks assessed ?  ? ?Cervical / Trunk Assessment ?Cervical / Trunk Assessment: Kyphotic  ?Communication  ? Communication: No difficulties  ?Cognition Arousal/Alertness: Awake/alert ?Behavior During Therapy: [facility] for tasks assessed/performed ?Overall Cognitive Status: Within Functional Limits for tasks assessed ?  ?  ?  ?  ?  ?  ?  ?  ?  ?  ?  ?  ?  ?  ?  ?  ?  ?  ?  ? ?  ?General Comments General comments (skin integrity, edema, etc.): Wife present during session. ? ?  ?Exercises    ? ?Assessment/Plan  ?  ?PT Assessment Patient needs continued PT services  ?PT Problem List Decreased mobility;Pain;Decreased balance;Decreased activity tolerance;Cardiopulmonary status limiting activity ? ?   ?  ?PT Treatment Interventions Therapeutic exercise;Patient/family education;Therapeutic activities;Functional mobility training;Balance training;Gait training;DME instruction;Stair training   ? ?PT Goals (Current goals can be found in the Care Plan section)  ?Acute Rehab PT Goals ?Patient Stated Goal: feel better ?PT Goal Formulation: With patient ?Time For Goal Achievement:  07/04/21 ?Potential to Achieve Goals: Fair ? ?  ?Frequency Min 3X/week ?  ? ? ?Co-evaluation   ?  ?  ?  ?  ? ? ?  ?AM-PAC PT "6 Clicks" Mobility  ?Outcome Measure Help needed turning from your back to your side while in a flat bed without using bedrails?: None ?Help needed moving from lying on your back to sitting on the side of a flat bed without using bedrails?: A Little ?Help needed moving to and from a bed to a chair (including a wheelchair)?: A Little ?Help needed standing up from a chair using your arms (e.g., wheelchair or bedside chair)?: A Little ?Help needed to walk in hospital room?: A Little ?Help needed climbing 3-5 steps with a railing? : A Little ?6 Click Score: 19 ? ?  ?End of Session Equipment Utilized During Treatment: Gait belt ?Activity Tolerance: Patient limited by pain ?Patient left: in bed;with call bell/phone within reach;with bed alarm set;with family/visitor present ?Nurse Communication: Mobility status ?PT Visit Diagnosis: Pain;Difficulty in walking, not elsewhere classified (R26.2) ?Pain - part of body:  (chest) ?  ? ?Time: 2952-8413 ?PT Time Calculation (min) (ACUTE ONLY): 28 min ? ? ?Charges:   PT Evaluation ?$PT Eval Moderate Complexity: 1 Mod ?PT Treatments ?$Therapeutic Activity: 8-22 mins ?  ?   ? ? ?Marisa Severin, PT, DPT ?Acute Rehabilitation Services ?Pager [phone] ?Office [phone] ? ? ? ? ?[place] ?06/20/2021, 3:27 PM ? ?

## 2021-06-21 ENCOUNTER — Other Ambulatory Visit: Payer: Medicare PPO

## 2021-06-21 ENCOUNTER — Inpatient Hospital Stay (HOSPITAL_COMMUNITY): Payer: Medicare PPO

## 2021-06-21 ENCOUNTER — Ambulatory Visit (HOSPITAL_COMMUNITY): Payer: Medicare PPO

## 2021-06-21 DIAGNOSIS — N179 Acute kidney failure, unspecified: Secondary | ICD-10-CM | POA: Diagnosis not present

## 2021-06-21 DIAGNOSIS — I509 Heart failure, unspecified: Secondary | ICD-10-CM

## 2021-06-21 DIAGNOSIS — G062 Extradural and subdural abscess, unspecified: Secondary | ICD-10-CM

## 2021-06-21 DIAGNOSIS — I5020 Unspecified systolic (congestive) heart failure: Secondary | ICD-10-CM

## 2021-06-21 DIAGNOSIS — J9601 Acute respiratory failure with hypoxia: Secondary | ICD-10-CM | POA: Diagnosis not present

## 2021-06-21 DIAGNOSIS — M8660 Other chronic osteomyelitis, unspecified site: Secondary | ICD-10-CM | POA: Diagnosis not present

## 2021-06-21 DIAGNOSIS — I5021 Acute systolic (congestive) heart failure: Secondary | ICD-10-CM | POA: Diagnosis not present

## 2021-06-21 DIAGNOSIS — I050 Rheumatic mitral stenosis: Secondary | ICD-10-CM

## 2021-06-21 LAB — CBC WITH DIFFERENTIAL/PLATELET
Abs Immature Granulocytes: 0.03 10*3/uL (ref 0.00–0.07)
Basophils Absolute: 0.1 10*3/uL (ref 0.0–0.1)
Basophils Relative: 1 %
Eosinophils Absolute: 0.2 10*3/uL (ref 0.0–0.5)
Eosinophils Relative: 2 %
HCT: 38.7 % — ABNORMAL LOW (ref 39.0–52.0)
Hemoglobin: 13.2 g/dL (ref 13.0–17.0)
Immature Granulocytes: 0 %
Lymphocytes Relative: 9 %
Lymphs Abs: 0.7 10*3/uL (ref 0.7–4.0)
MCH: 29.1 pg (ref 26.0–34.0)
MCHC: 34.1 g/dL (ref 30.0–36.0)
MCV: 85.4 fL (ref 80.0–100.0)
Monocytes Absolute: 0.6 10*3/uL (ref 0.1–1.0)
Monocytes Relative: 7 %
Neutro Abs: 6.5 10*3/uL (ref 1.7–7.7)
Neutrophils Relative %: 81 %
Platelets: 191 10*3/uL (ref 150–400)
RBC: 4.53 MIL/uL (ref 4.22–5.81)
RDW: 13.6 % (ref 11.5–15.5)
WBC: 8 10*3/uL (ref 4.0–10.5)
nRBC: 0 % (ref 0.0–0.2)

## 2021-06-21 LAB — COMPREHENSIVE METABOLIC PANEL
ALT: 8 U/L (ref 0–44)
AST: 13 U/L — ABNORMAL LOW (ref 15–41)
Albumin: 2.6 g/dL — ABNORMAL LOW (ref 3.5–5.0)
Alkaline Phosphatase: 50 U/L (ref 38–126)
Anion gap: 10 (ref 5–15)
BUN: 26 mg/dL — ABNORMAL HIGH (ref 8–23)
CO2: 33 mmol/L — ABNORMAL HIGH (ref 22–32)
Calcium: 8.4 mg/dL — ABNORMAL LOW (ref 8.9–10.3)
Chloride: 89 mmol/L — ABNORMAL LOW (ref 98–111)
Creatinine, Ser: 1.62 mg/dL — ABNORMAL HIGH (ref 0.61–1.24)
GFR, Estimated: 43 mL/min — ABNORMAL LOW (ref >60)
Glucose, Bld: 169 mg/dL — ABNORMAL HIGH (ref 70–99)
Potassium: 3.9 mmol/L (ref 3.5–5.1)
Sodium: 132 mmol/L — ABNORMAL LOW (ref 135–145)
Total Bilirubin: 0.7 mg/dL (ref 0.3–1.2)
Total Protein: 5.6 g/dL — ABNORMAL LOW (ref 6.5–8.1)

## 2021-06-21 LAB — GLUCOSE, CAPILLARY
Glucose-Capillary: 159 mg/dL — ABNORMAL HIGH (ref 70–99)
Glucose-Capillary: 219 mg/dL — ABNORMAL HIGH (ref 70–99)
Glucose-Capillary: 173 mg/dL — ABNORMAL HIGH (ref 70–99)

## 2021-06-21 LAB — MAGNESIUM: Magnesium: 2.1 mg/dL (ref 1.7–2.4)

## 2021-06-21 LAB — PHOSPHORUS: Phosphorus: 4.1 mg/dL (ref 2.5–4.6)

## 2021-06-21 LAB — PATHOLOGIST SMEAR REVIEW

## 2021-06-21 MED ORDER — BISOPROLOL FUMARATE 5 MG PO TABS
2.5000 mg | ORAL_TABLET | Freq: Every day | ORAL | Status: DC
  Administered 2021-06-21 – 2021-06-29 (×8): 2.5 mg via ORAL
  Filled 2021-06-21 (×9): qty 1

## 2021-06-21 MED ORDER — FUROSEMIDE 10 MG/ML IJ SOLN
40.0000 mg | Freq: Every day | INTRAMUSCULAR | Status: DC
Start: 2021-06-21 — End: 2021-06-22
  Administered 2021-06-21: 40 mg via INTRAVENOUS

## 2021-06-21 MED ORDER — GADOBUTROL 1 MMOL/ML IV SOLN
8.5000 mL | Freq: Once | INTRAVENOUS | Status: AC | PRN
  Administered 2021-06-21: 8.5 mL via INTRAVENOUS

## 2021-06-21 MED ORDER — FUROSEMIDE 10 MG/ML IJ SOLN
40.0000 mg | Freq: Every day | INTRAMUSCULAR | Status: DC
  Filled 2021-06-21: qty 4

## 2021-06-21 NOTE — Consult Note (Addendum)
WOC Nurse Consult Note: ?Reason for Consult: Consult requested for bilat feet.  Pt had toe amputations previously which are healed.  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  He has been told to apply Betadine to bilat foot wounds prior to admission. All are black dry eschar without odor, drainage, or fluctuance.  ?Left 4th tip of toe 2X2cm ?Left anterior plantar foot .5X.5cm, surrounded by dry callous raised above skin level ?Right anterior foot over previous toe amputation site with patchy areas of eschar, approx .5X2cm ?Dressing procedure/placement/frequency: Continue present plan of care; topical treatment orders provided for bedside nurses to perform as follows: Apply Betadine using a swab to left and right foot wounds Q day, then cover with kerlex.  Pt should resume follow-up with podiatry service after discharge. ?Please re-consult if further assistance is needed.  Thank-you,  ?Julien Girt MSN, RN, [place], [place], CNS ?[phone]  ?  ?

## 2021-06-21 NOTE — Assessment & Plan Note (Signed)
-  See Epidural Abscess ?

## 2021-06-21 NOTE — Assessment & Plan Note (Signed)
-  CXR 06/19/21 showed "Overall slightly worsened aeration, with increased interstitial edema and right greater than left airspace disease. The airspace disease could represent atelectasis or concurrent pneumonia. Enlargement of right larger than left pleural effusions. Aortic Atherosclerosis." -Had a Thoracentesis on the Right yesterday that yielded 2.4 Liters of Amber Fluid -Left Side to be done today -CXR this AM showed "Worsening aeration of both lungs compatible with increased pulmonary edema and pleural effusions." -Repeat CXR in the AM

## 2021-06-21 NOTE — H&P (View-Only) (Signed)
Subjective:  ?Breathing improving ? ?S/p Rt thoracentesis 3/12 ?Lt thoracentesis today ? ?Objective:  ?Vital Signs in the last 24 hours: ?Temp:  [97.9 ?F (36.6 ?C)-98.2 ?F (36.8 ?C)] 97.9 ?F (36.6 ?C) (03/13 9622) ?Pulse Rate:  [88-92] 88 (03/13 0828) ?Resp:  [19-20] 20 (03/13 2979) ?BP: (117-122)/(63-70) 121/64 (03/13 8921) ?SpO2:  [93 %-98 %] 98 % (03/13 0828) ? ?Intake/Output from previous day: ?03/12 0701 - 03/13 0700 ?In: 840 [P.O.:840] ?Out: 2950 [Urine:2950] ? ?Physical Exam ?Vitals and nursing note reviewed.  ?Constitutional:   ?   General: He is not in acute distress. ?Neck:  ?   Vascular: JVD present.  ?Cardiovascular:  ?   Rate and Rhythm: Normal rate and regular rhythm.  ?   Heart sounds: Normal heart sounds. No murmur heard. ?Pulmonary:  ?   Effort: Pulmonary effort is normal.  ?   Breath sounds: Examination of the right-lower field reveals decreased breath sounds. Examination of the left-lower field reveals decreased breath sounds. Decreased breath sounds present. No wheezing or rales.  ?Musculoskeletal:  ?   Right lower leg: Edema (1+) present.  ?   Left lower leg: Edema (1+) present.  ? ? ?Cardiac Studies: ? ?Reviewed and interpreted: ?BMP, BNP, trop HS ?  ?Imaging/tests reviewed and independently interpreted: ?  ?Cardiac Studies: ?  ?Telemetry 06/20/2021: ?No significant arrhythmia ?  ?EKG 06/19/2021: ?Sinus tachycardia ?Anterior infarct, old ?  ?Echocardiogram 06/19/2021: ? 1. Left ventricular ejection fraction, by estimation, is 25 to 30%. The  ?left ventricle has severely decreased function. The left ventricle  ?demonstrates regional wall motion abnormalities (see scoring  ?diagram/findings for description). Left ventricular  ?diastolic function could not be evaluated. There is severe hypokinesis of  ?the left ventricular, mid-apical anteroseptal wall, anterior wall and  ?apical segment.  ? 2. Right ventricular systolic function is mildly reduced. The right  ?ventricular size is normal. There is  normal pulmonary artery systolic  ?pressure.  ? 3. Left atrial size was mildly dilated.  ? 4. Right atrial size was mildly dilated.  ? 5. The mitral valve is abnormal. Moderate mitral valve regurgitation.  ?Moderate mitral stenosis. Moderate mitral annular calcification. Mean PG 9  ?mmHg, MVA 1.34 cm2 by PHT method, likely more prominent due to resting  ?tachycardia around 104 bpm.  ? 6. Tricuspid valve regurgitation is mild to moderate.  ? 7. The aortic valve is tricuspid. There is moderate calcification of the  ?aortic valve. Aortic valve regurgitation is moderate. Aortic valve area,  ?by VTI measures 0.87 cm?Marland Kitchen Aortic valve mean gradient measures 22.0 mmHg.  ?Aortic valve Vmax measures 3.14  ?m/s. Dimensionless index 0.29.  ? 8. Moderate pleural effusion.  ? 9. Compared to previous transthoracic and transesophageal studies in  ?04/2021, LVEF is reduced from 40-45%. Regional wall motion abnormalities  ?(LAD territory) are more pronounced Mild RV dysfunctinon is new. Valvular  ?findings are relatively unchanged.  ?  ?Echocardiogram 04/26/2021: ?1. Left ventricular ejection fraction, by estimation, is 40 to 45%. The  ?left ventricle has mildly decreased function. The left ventricle  ?demonstrates regional wall motion abnormalities (see scoring  ?diagram/findings for description). Left ventricular  ?diastolic parameters are indeterminate.  ? 2. Right ventricular systolic function is normal. The right ventricular  ?size is normal. Tricuspid regurgitation signal is inadequate for assessing  ?PA pressure.  ? 3. Left atrial size was mildly dilated.  ? 4. Right atrial size was mildly dilated.  ? 5. The mitral valve is degenerative. No evidence of mitral valve  ?  regurgitation. Mild to moderate mitral stenosis. The mean mitral valve  ?gradient is 6.0 mmHg with average heart rate of 88 bpm. Moderate to severe  ?mitral annular calcification.  ? 6. The aortic valve is tricuspid. Aortic valve regurgitation is not  ?visualized.  Moderate to severe aortic valve stenosis. Aortic valve mean  ?gradient measures 28.0 mmHg. Aortic valve Vmax measures 3.82 m/s.  ? ?Conclusion(s)/Recommendation(s): No hemodynamically significant valvular  ?heart disease.  ?  ?Assessment & Recommendations: ?  ?76 y.o. Caucasian male  with hypertension, uncontrolled type 2 diabetes mellitus, peripheral artery disease, h/o osteomyelitis s/p right foot toe amputation, nonhealing wound left foot toe, h/o epidural abscess and MSSA bacteremia 04/2021, critical limb ischemia, moderate mitral and aortic stenosis, now admitted with congestive heart failure. ?  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  I recommend he probably had a myocardial infarction episode about a month ago while in nursing home. ?I personally reviewed and compared with prior transthoracic and transesophageal echocardiogram in 04/2021. ?Regional wall motion abnormalities in LAD territory are new, LVEF is significantly reduced, mild RV dysfunction is new. ?Moderate mitral stenosis and aortic stenosis relatively unchanged and unlikely to be primary cause for his cardiomyopathy. ?He will need evaluation for obstructive coronary artery disease.  This has to be timed in relation to his renal dysfunction issues. ?Given his type 2 diabetes mellitus and LV dysfunction, CABG would be recommended should he have severe obstructive multivessel disease with viable myocardium.  However, this can be challenging given his history of osteomyelitis, current nonhealing wound that could require amputation and left foot.  We will discuss more as we go along. ?Given that his renal function is slightly better today, we can use Gadolinium contrast. Cardiac MRI with Gadolinium contrast will be of more value in assessing viable myocardium, before even performing cardiac catheterization. This could help in minimizing contrast during cardiac catheterization and strategize revascularization strategies  better. ?Ordered for today. ?Reduce IV lasix from 40 mg bid to 40 mg daily. ?Add bisoprolol 2.5 mg today. ?Continue Corlanor 5 mg twice daily. ?Holding of ARB/ARNI given renal dysfunction and possibility of upcoming coronary angiography in the near future. ?Will add Farxiga after heart catheterization. ?  ?CAD: ?Suspect obstructive coronary artery disease, at least in LAD territory. ?Troponin elevation likely reflective of type II MI in the setting of heart failure, and possibly resting ischemia in the setting of suspected obstructive coronary artery disease.  No acute coronary syndrome. ?Continue aspirin, Plavix. ?Needs aggressive management of type 2 diabetes mellitus.  Long discussion with the patient regarding dietary modifications.  Defer management of hyperglycemia to primary team. ?Check lipid panel. ?Ordered Crestor 20 mg.  He reportedly has myalgias with pravastatin in the past if intolerant to Crestor, will need to consider Repatha. ?Plan for cardiac MRI today, possible heart catheterization 3/14. ? ?Hyponatremia: ?Likely delusional in the setting of decompensated heart failure. ?Improving with diuresis. ?  ?PAD: ?Absent pedal pulses on exam. ?Follows up with Dr. [name] with vascular interventional radiology. ?Plan for repeat angiography per Dr. [name] prior to anticipated amputation of left foot toes ?   ?Discussed with Dr. Alfredia Ferguson. ? ? ? ?Nigel Mormon, MD ?Pager: [phone] ?Office: [phone] ? ?

## 2021-06-21 NOTE — Progress Notes (Signed)
Physical Therapy Treatment ?Patient Details ?Name: Zachary [name] ?MRN: [id number] ?DOB: [birth year] ?Today's Date: 06/21/2021 ? ? ?History of Present Illness Patient is a 76 y/o male who presents on 3/10 with SOB, chest pain and BLE swelling. Found to have CHF exacerbation, s/p right thoracentesis 3/12. Plan for stress test Monday 06/21/21. PMH includes DM, HTN, PVD, left great toe amp, recently with long hospital stay in 04/2021 and d/ced from rehab (only home 2 days before being readmitted). ? ?  ?PT Comments  ? ? Patient progressing well towards PT goals. Reports swelling has gone down in BLEs and breathing is improved. Reports pain in chest with coughing. Improved ambulation distance with use of RW and min guard assist. Sp02 remained in high 90s on 2L/min 02 [place] with 2/4 DOE. Reports some weakness in LLE. Plan for cardiac cath tomorrow 06/22/21. Recommend getting up daily to prevent hospital deconditioning. Will follow and progress as able.  ?   ?Recommendations for follow up therapy are one component of a multi-disciplinary discharge planning process, led by the attending physician.  Recommendations may be updated based on patient status, additional functional criteria and insurance authorization. ? ?Follow Up Recommendations ? Home health PT ?  ?  ?Assistance Recommended at Discharge Intermittent Supervision/Assistance  ?Patient can return home with the following A little help with walking and/or transfers;A little help with bathing/dressing/bathroom;Assistance with cooking/housework;Help with stairs or ramp for entrance;Assist for transportation ?  ?Equipment Recommendations ? None recommended by PT  ?  ?Recommendations for Other Services   ? ? ?  ?Precautions / Restrictions Precautions ?Precautions: Fall;Other (comment) ?Precaution Comments: watch 02 ?Restrictions ?Weight Bearing Restrictions: No  ?  ? ?Mobility ? Bed Mobility ?Overal bed mobility: Needs Assistance ?Bed Mobility: Supine to Sit, Sit to Supine ?  ?   ?Supine to sit: Supervision, HOB elevated ?Sit to supine: HOB elevated, Supervision ?  ?General bed mobility comments: No assist needed. ?  ? ?Transfers ?Overall transfer level: Needs assistance ?Equipment used: Rolling walker (2 wheels) ?Transfers: Sit to/from Stand ?Sit to Stand: Min guard ?  ?  ?  ?  ?  ?General transfer comment: Min guard for safety. Stood from [employer]. ?  ? ?Ambulation/Gait ?Ambulation/Gait assistance: Min guard ?Gait Distance (Feet): 300 Feet ?Assistive device: Rolling walker (2 wheels) ?Gait Pattern/deviations: Step-through pattern, Decreased stride length ?Gait velocity: functional ?Gait velocity interpretation: 1.31 - 2.62 ft/sec, indicative of limited community ambulator ?  ?General Gait Details: Slow, mostly steady gait with RW; reports LLE weakness. No evidence of imbalance, needs 2L/min 02 [place] Sp02 99%. 2/4 DOE. ? ? ?Stairs ?  ?  ?  ?  ?  ? ? ?Wheelchair Mobility ?  ? ?Modified Rankin (Stroke Patients Only) ?  ? ? ?  ?Balance Overall balance assessment: Needs assistance ?Sitting-balance support: Feet supported, No upper extremity supported ?Sitting balance-Leahy Scale: Good ?  ?  ?Standing balance support: During functional activity, Reliant on assistive device for balance ?Standing balance-Leahy Scale: Poor ?  ?  ?  ?  ?  ?  ?  ?  ?  ?  ?  ?  ?  ? ?  ?Cognition Arousal/Alertness: Awake/alert ?Behavior During Therapy: [facility] for tasks assessed/performed ?Overall Cognitive Status: Within Functional Limits for tasks assessed ?  ?  ?  ?  ?  ?  ?  ?  ?  ?  ?  ?  ?  ?  ?  ?  ?General Comments: Question some memory deficits as pt asking  to go home despite being told he has a procedure planned for tomorrow/ ?  ?  ? ?  ?Exercises   ? ?  ?General Comments   ?  ?  ? ?Pertinent Vitals/Pain Pain Assessment ?Pain Assessment: Faces ?Faces Pain Scale: Hurts a little bit ?Pain Location: chest when he coughs ?Pain Descriptors / Indicators: Discomfort, Grimacing ?Pain Intervention(s): Monitored during  session  ? ? ?Home Living   ?  ?  ?  ?  ?  ?  ?  ?  ?  ?   ?  ?Prior Function    ?  ?  ?   ? ?PT Goals (current goals can now be found in the care plan section) Progress towards PT goals: Progressing toward goals ? ?  ?Frequency ? ? ? Min 3X/week ? ? ? ?  ?PT Plan Current plan remains appropriate  ? ? ?Co-evaluation   ?  ?  ?  ?  ? ?  ?AM-PAC PT "6 Clicks" Mobility   ?Outcome Measure ? Help needed turning from your back to your side while in a flat bed without using bedrails?: None ?Help needed moving from lying on your back to sitting on the side of a flat bed without using bedrails?: A Little ?Help needed moving to and from a bed to a chair (including a wheelchair)?: A Little ?Help needed standing up from a chair using your arms (e.g., wheelchair or bedside chair)?: A Little ?Help needed to walk in hospital room?: A Little ?Help needed climbing 3-5 steps with a railing? : A Little ?6 Click Score: 19 ? ?  ?End of Session   ?Activity Tolerance: Patient tolerated treatment well ?Patient left: in bed;with call bell/phone within reach;with bed alarm set;with family/visitor present ?Nurse Communication: Mobility status ?PT Visit Diagnosis: Pain;Difficulty in walking, not elsewhere classified (R26.2) ?Pain - part of body:  (chest with coughing) ?  ? ? ?Time: 2706-2376 ?PT Time Calculation (min) (ACUTE ONLY): 20 min ? ?Charges:  $Therapeutic Exercise: 8-22 mins          ?          ? ?Marisa Severin, PT, DPT ?Acute Rehabilitation Services ?Pager [phone] ?Office [phone] ? ? ? ? ? ?[place] ?06/21/2021, 2:01 PM ? ?

## 2021-06-21 NOTE — TOC Initial Note (Signed)
Transition of Care (TOC) - Initial/Assessment Note  ? ? ?Patient Details  ?Name: Zachary [name] ?MRN: [id number] ?Date of Birth: October 26, 1945 ? ?Transition of Care (TOC) CM/SW Contact:    ?Tom-Arenas, Renea Ee, RN ?Phone Number: ?06/21/2021, 4:25 PM ? ?Clinical Narrative:                 ? ?CM spoke with patient and wife, Zachary [name] about needs for post hospital transition. From home with wife. Recently discharged form the hospital, went to [facility] and home with home health. Now admitted for CHF exacerbation, s/p right thoracentesis 3/12. Nuclear Stress Test scheduled for today, 06/21/21 and will undergo right and left heart cath and coronary angiography tomorrow, 06/22/21.  ?Has two supportive adult children. Has a walker and shower seat at home. PCP is [name], [place] Reader, FNP and [facility] [address]. ?PT/OT recommending home health. Patient currently active with [facility]. Will continue services at discharge. Family to transport at discharge. ?CM will continue to follow for needs.  ? ?Expected Discharge Plan: [place] ?Barriers to Discharge: Continued Medical Work up ? ? ?Patient Goals and CMS Choice ?Patient states their goals for this hospitalization and ongoing recovery are:: To return home ?CMS Medicare.gov Compare Post Acute Care list provided to:: Patient ?Choice offered to / list presented to : Patient, Spouse ? ?Expected Discharge Plan and Services ?Expected Discharge Plan: [place] ?  ?Discharge Planning Services: CM Consult ?Post Acute Care Choice: Home Health ?Living arrangements for the past 2 months: [place] ?                ?DME Arranged: N/A ?DME Agency: NA ?  ?  ?  ?HH Arranged: PT, OT, RN ?[place] Agency: [place] [facility] ?Date HH Agency Contacted: 06/21/21 ?Time [place]: [id number] ?Representative spoke with at [place]: Tommi Rumps ? ?Prior Living Arrangements/Services ?Living arrangements for the past 2 months: [place] ?Lives with:: Spouse ?Patient language and need for interpreter reviewed:: Yes ?Do you feel safe going back to the place where you live?: Yes      ?Need for Family Participation in Patient Care: Yes (Comment) ?Care giver support system in place?: Yes (comment) ?Current home services: DME [place] Rile, shower seat) ?Criminal Activity/Legal Involvement Pertinent to Current Situation/Hospitalization: No - Comment as needed ? ?Activities of Daily Living ?Home Assistive Devices/Equipment: Dentures (specify type), CBG Meter, Eyeglasses, Shower chair without back, Grab bars in shower, Scales ?ADL Screening (condition at time of admission) ?Patient's cognitive ability adequate to safely complete daily activities?: Yes ?Is the patient deaf or have difficulty hearing?: No ?Does the patient have difficulty seeing, even when wearing glasses/contacts?: No ?Does the patient have difficulty concentrating, remembering, or making decisions?: No ?Patient able to express need for assistance with ADLs?: Yes ?Does the patient have difficulty dressing or bathing?: No ?Independently performs ADLs?: No ?Communication: Needs assistance ?Is this a change from baseline?: Change from baseline, expected to last <3 days ?Dressing (OT): Needs assistance ?Is this a change from baseline?: Change from baseline, expected to last <3days ?Grooming: Needs assistance ?Is this a change from baseline?: Change from baseline, expected to last <3 days ?Feeding: Independent ?Bathing: Needs assistance ?Is this a change from baseline?: Change from baseline, expected to last <3 days ?Toileting: Needs assistance ?Is this a change from baseline?: Change from baseline, expected to last <3 days ?In/Out Bed: Needs assistance ?Is this a change from baseline?: Change from baseline,  expected to last <3 days ?Does the patient have difficulty walking or climbing stairs?: No ?Weakness of Legs: None ?Weakness of Arms/Hands: None ? ?Permission Sought/Granted ?Permission  sought to share information with : Case Manager, Customer service manager, Family Supports ?Permission granted to share information with : Yes, Verbal Permission Granted ? Share Information with NAME: Tommi Rumps ? Permission granted to share info w AGENCY: [facility] ?   ?   ? ?Emotional Assessment ?Appearance:: Appears stated age ?Attitude/Demeanor/Rapport: Engaged, Gracious ?Affect (typically observed): Accepting, Appropriate, Calm, Hopeful ?Orientation: : Oriented to Self, Oriented to Place, Oriented to Situation ?Alcohol / Substance Use: Not Applicable ?Psych Involvement: No (comment) ? ?Admission diagnosis:  Shortness of breath [R06.02] ?Acute CHF (congestive heart failure) (HCC) [I50.9] ?Patient Active Problem List  ? Diagnosis Date Noted  ? Pleural effusion due to CHF (congestive heart failure) ([place]) 06/21/2021  ? Acute systolic CHF (congestive heart failure) ([place]) 06/18/2021  ? AKI (acute kidney injury) ([place]) 06/18/2021  ? Acute respiratory failure with hypoxia ([place]) 06/18/2021  ? Mitral stenosis 06/18/2021  ? Chronic osteomyelitis ([place]) 06/18/2021  ? Myoclonic jerking- right hand 04/26/2021  ? Constipation 04/25/2021  ? 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 E. coli UTI 04/21/2021  ? MSSA bacteremia 04/19/2021  ? Osteomyelitis of great toe of right foot ([place]) 04/18/2021  ? sepsis secondary to pneumonia vs. osteomyelitis of rigth great toe  04/18/2021  ? Hyponatremia 04/18/2021  ? Diabetic peripheral neuropathy associated with type 2 diabetes mellitus ([place]) 01/08/2021  ? Peripheral vascular disease ([place]) 01/08/2021  ? Body mass index (BMI) 30.0-30.9, adult 03/24/2020  ? Carpal tunnel syndrome 03/24/2020  ? Colonic polyp 03/24/2020  ? 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 Elevated PSA 03/24/2020  ? Essential hypertension 03/24/2020  ? 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 Headache 03/24/2020  ? Hyperlipidemia, unspecified 03/24/2020  ? Hyperlipidemia 03/24/2020  ? Long term (current) use of insulin ([place])  03/24/2020  ? 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 Stage 3 chronic kidney disease ([place]) 03/24/2020  ? 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 ([place]) 59/16/3846  ? Gangrenous toe ([place])   ? Acute osteomyelitis ([place]) 09/13/2019  ? Quadriceps tendon rupture, left, initial encounter 01/10/2019  ? Closed comminuted fracture of left patella, initial encounter 01/08/2019  ? ?PCP:  [name], FNP ?Pharmacy:   ?[id number]?Phone: [phone]: [phone]? ? ? ? ?Social Determinants of Health (SDOH) Interventions ?  ? ?Readmission Risk Interventions ?No flowsheet data found. ? ? ?

## 2021-06-21 NOTE — Evaluation (Signed)
Occupational Therapy Evaluation ?Patient Details ?Name: Zachary [name] ?MRN: [id number] ?DOB: [birth year] ?Today's Date: 06/21/2021 ? ? ?History of Present Illness Patient is a 76 y/o male who presents on 3/10 with SOB, chest pain and BLE swelling. Found to have CHF exacerbation, s/p right thoracentesis 3/12. Plan for stress test Monday 06/21/21. PMH includes DM, HTN, PVD, left great toe amp, recently with long hospital stay in 04/2021 and d/ced from rehab (only home 2 days before being readmitted).  ? ?Clinical Impression ?  ?Prior to this admission, patient had been discharged from [facility] rehab 2 days and had returned home. Wife is available 24/7 and son lives close by. Currently, patient is min A for lower body ADLs, and min guard for transfers. Patient declining OOB activity after completing sit<>stand at side of bed and marching in place. Patient left with all needs met; OT recommending HHOT at discharge to promote safety completing functional tasks. OT will continue to follow acutely in order to address problem list outlined below.  ?   ? ?Recommendations for follow up therapy are one component of a multi-disciplinary discharge planning process, led by the attending physician.  Recommendations may be updated based on patient status, additional functional criteria and insurance authorization.  ? ?Follow Up Recommendations ? Home health OT  ?  ?Assistance Recommended at Discharge Intermittent Supervision/Assistance  ?Patient can return home with the following A little help with bathing/dressing/bathroom;Assistance with cooking/housework;Assist for transportation;Help with stairs or ramp for entrance ? ?  ?Functional Status Assessment ? Patient has had a recent decline in their functional status and demonstrates the ability to make significant improvements in function in a reasonable and predictable amount of time.  ?Equipment Recommendations ? None recommended by OT (Patient has all DME needed)  ?  ?Recommendations  for Other Services   ? ? ?  ?Precautions / Restrictions Precautions ?Precautions: Fall;Other (comment) ?Precaution Comments: watch 02 ?Restrictions ?Weight Bearing Restrictions: No  ? ?  ? ?Mobility Bed Mobility ?Overal bed mobility: Needs Assistance ?Bed Mobility: Supine to Sit, Sit to Supine ?  ?  ?Supine to sit: Supervision, HOB elevated ?Sit to supine: HOB elevated, Supervision ?  ?General bed mobility comments: No assist needed. ?  ? ?Transfers ?Overall transfer level: Needs assistance ?Equipment used: Rolling walker (2 wheels) ?Transfers: Sit to/from Stand ?Sit to Stand: Min guard ?  ?  ?  ?  ?  ?General transfer comment: Min guard for safety. Stood from [employer]. ?  ? ?  ?Balance Overall balance assessment: Needs assistance ?Sitting-balance support: Feet supported, No upper extremity supported ?Sitting balance-Leahy Scale: Good ?  ?  ?Standing balance support: During functional activity, Reliant on assistive device for balance ?Standing balance-Leahy Scale: Poor ?  ?  ?  ?  ?  ?  ?  ?  ?  ?  ?  ?  ?   ? ?ADL either performed or assessed with clinical judgement  ? ?ADL Overall ADL's : Needs assistance/impaired ?Eating/Feeding: Set up;Sitting ?  ?Grooming: Set up;Sitting ?  ?Upper Body Bathing: Set up;Sitting ?  ?Lower Body Bathing: Minimal assistance;Sitting/lateral leans ?  ?Upper Body Dressing : Set up;Sitting ?  ?Lower Body Dressing: Minimal assistance;Sitting/lateral leans ?  ?Toilet Transfer: Min guard;Ambulation;Rolling walker (2 wheels) ?  ?Toileting- Clothing Manipulation and Hygiene: Set up;Sitting/lateral lean ?  ?  ?  ?Functional mobility during ADLs: Minimal assistance;Rolling walker (2 wheels) ?General ADL Comments: Patient presenting with decreased activity tolerance  ? ? ? ?Vision Baseline Vision/History: 1 Wears  glasses (for reading) ?Ability to See in Adequate Light: 0 Adequate ?Patient Visual Report: No change from baseline ?   ?   ?Perception   ?  ?Praxis   ?  ? ?Pertinent Vitals/Pain Pain  Assessment ?Pain Assessment: No/denies pain  ? ? ? ?Hand Dominance Right ?  ?Extremity/Trunk Assessment Upper Extremity Assessment ?Upper Extremity Assessment: Overall WFL for tasks assessed ?  ?Lower Extremity Assessment ?Lower Extremity Assessment: Defer to PT evaluation ?  ?Cervical / Trunk Assessment ?Cervical / Trunk Assessment: Kyphotic ?  ?Communication Communication ?Communication: No difficulties ?  ?Cognition Arousal/Alertness: Awake/alert ?Behavior During Therapy: [facility] for tasks assessed/performed ?Overall Cognitive Status: Within Functional Limits for tasks assessed ?  ?  ?  ?  ?  ?  ?  ?  ?  ?  ?  ?  ?  ?  ?  ?  ?  ?  ?  ?General Comments    ? ?  ?Exercises   ?  ?Shoulder Instructions    ? ? ?Home Living Family/patient expects to be discharged to:: Private residence ?Living Arrangements: Spouse/significant other ?Available Help at Discharge: Family;Available 24 hours/day ?Type of Home: House ?Home Access: Stairs to enter ?Entrance Stairs-Number of Steps: 2 ?Entrance Stairs-Rails: Can reach both ?Home Layout: One level ?  ?  ?Bathroom Shower/Tub: Gaffer;Door ?  ?Bathroom Toilet: Standard ?  ?  ?Home Equipment: [employer] aid/development worker (2 wheels) ?  ?Additional Comments: son lives close and helps as needed ?  ? ?  ?Prior Functioning/Environment Prior Level of Function : Independent/Modified Independent ?  ?  ?  ?  ?  ?  ?Mobility Comments: Using RW since leaving rehab, was only home 2 nights. ?ADLs Comments: min A to complete lower body ADLs otherwise independent ?  ? ?  ?  ?OT Problem List: Decreased strength;Decreased activity tolerance;Impaired balance (sitting and/or standing) ?  ?   ?OT Treatment/Interventions: Self-care/ADL training;Therapeutic exercise;Energy conservation;DME and/or AE instruction;Therapeutic activities;Patient/family education;Balance training  ?  ?OT Goals(Current goals can be found in the care plan section) Acute Rehab OT Goals ?Patient Stated Goal: to go  home ?OT Goal Formulation: With patient ?Time For Goal Achievement: 07/05/21 ?Potential to Achieve Goals: Fair ?ADL Goals ?Pt Will Perform Lower Body Bathing: with adaptive equipment;sitting/lateral leans;sit to/from stand;with set-up ?Pt Will Perform Lower Body Dressing: with set-up;with adaptive equipment;sitting/lateral leans;sit to/from stand ?Pt Will Transfer to Toilet: with supervision;ambulating ?Additional ADL Goal #1: Patient will demonstrate increased activity tolerance by completing functional standing task for 3-5 with one seated rest break.  ?OT Frequency: Min 2X/week ?  ? ?Co-evaluation   ?  ?  ?  ?  ? ?  ?AM-PAC OT "6 Clicks" Daily Activity     ?Outcome Measure Help from another person eating meals?: A Little ?Help from another person taking care of personal grooming?: A Little ?Help from another person toileting, which includes using toliet, bedpan, or urinal?: A Little ?Help from another person bathing (including washing, rinsing, drying)?: A Little ?Help from another person to put on and taking off regular upper body clothing?: A Little ?Help from another person to put on and taking off regular lower body clothing?: A Little ?6 Click Score: 18 ?  ?End of Session Equipment Utilized During Treatment: Rolling walker (2 wheels) ?Nurse Communication: Mobility status ? ?Activity Tolerance: Patient tolerated treatment well ?Patient left: in bed;with call bell/phone within reach;with nursing/sitter in room ? ?OT Visit Diagnosis: Unsteadiness on feet (R26.81);Other abnormalities of gait and mobility (R26.89);Muscle weakness (generalized) (  M62.81)  ?              ?Time: 5208-0223 ?OT Time Calculation (min): 12 min ?Charges:  OT General Charges ?$OT Visit: 1 Visit ?OT Evaluation ?$OT Eval Moderate Complexity: 1 Mod ? ?Corinne Ports E. Manny Vitolo, OTR/L ?Acute Rehabilitation Services ?[phone] ?[phone]  ? ?Corinne Ports Trenda Corliss ?06/21/2021, 9:18 AM ?

## 2021-06-21 NOTE — Progress Notes (Signed)
PROGRESS NOTE    Zachary [name]  GHW:[id number] DOB: 08-12-1945 DOA: 06/18/2021 PCP: [name], FNP   Brief Narrative:  HPI per Dr. Ileene Musa Zachary [name] is a 76 y.o. male with medical history significant of uncontrolled type 2 diabetes, hypertension, hyperlipidemia, PVD, osteomyelitis, MSSA bacteremia who presents with concerns of increasing shortness of breath.   Reports for the past 3 days he has noticed progressive increasing shortness of breath with exertion.  Also feels very edematous with lower extremity swelling.  Denies any chest pain.  No cough or fever.  Patient was just discharged from rehab several days ago following a prolonged hospital course.   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  Later found to have MSSA bacteremia, E. 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  TEE on 1/16 shows no vegetation, EF of 60 to 65% with moderate calcified mitral stenosis.  Also later discovered to have osteomyelitis of the left fourth distal phalanx. Initially treated on cefepime but right hand jerking and was discontinued by neurology.  Ultimately was placed on IV cefazolin and oral ciprofloxacin per ID and completed on 06/14/2021.   In the ED, he was afebrile but hypoxic requiring 2 L via nasal cannula.  No leukocytosis sodium of 129, K of 4.1, creatinine of 1.56 from prior of 1.36.  BNP of 2098.  Troponin of 45 and 33.   EKG on my review shows right axis deviation with intraventricular conduction delay.   **Interim History Cardiology was consulted and patient was found to have heart failure reduced ejection fraction with regional wall motion abnormalities and a mild right ventricular dysfunction that was new.  Given his comorbidities cardiology recommended Lasix IV twice daily and adding [name] nor 5 mg twice daily and holding  beta-blocker due to decompensated heart failure and adding [place].  They are recommending a nuclear stress test to evaluate for ischemia versus infarction on Monday, 06/21/2021 and then performing a right and left heart cath and coronary angiography on 06/22/2021.  Given that his renal function is slightly better today cardiology is going to forego his nuclear medicine scan and recommending a cardiac MRI with gadolinium contrast to assess viable myocardium prior to performing cardiac catheterization.  Continues to be diuresed but continues to be short of breath.  Given that he has bilateral pleural effusions we will consult interventional radiology for thoracentesis and he had a right-sided thoracentesis that yielded 2.4 L of amber-colored fluid and will do the left side tomorrow.  PT OT recommending home health  06/21/2021: He has not reduced his Lasix from IV 40 mg twice daily to IV 40 mg daily and added bisoprolol 2.5 mg today    Assessment and Plan: * Acute CHF (congestive heart failure) ([place]) -Found to have Systolic Dysfunction CXR with bilateral pleural effusion with hypoxia and LE edema on exam -Recent TEE on 1/16 shows no vegetation, EF of 60 to 65% with moderate calcified mitral stenosis. Pt recently completed 8 week course of IV cefazolin and Cipro on 3/6. No fever or leukocytosis to suggest ongoing infection. -Repeat ECHO showed an EF of 25 to 30% with severely decreased left ventricular function and left ventricular diastolic function could not be evaluated.  He has mild RV dysfunction as well that was new and valvular findings are relatively unchanged -Lasix IV '40mg'$  daily increased  to BID and now they will decrease to Daily  -Patient is Negative 2.635 Liters  -Strict intake and output, daily weights -Cardiology was consulted and they are recommending nuclear medicine stress test on Monday and cardiac cath on Tuesday -Cardiology recommends adding Corlanor 5 mg twice daily and were holding  beta-blocker due to decompensated heart failure but now they are adding Bisoprolol 2.5 mg Daily  -Currently holding of ARB and ARNI given possible cath and recommending adding Farxiga and after the cath -Plan is for nuclear medicine stress test tomorrow  Acute respiratory failure with hypoxia ([place]) -Secondary to new CHF exacerbation. Admit on 2L  -SpO2: 95 % O2 Flow Rate (L/min): 2 L/min -Has bilateral pleural effusions worse on the right compared to left and may benefit from thoracentesis but he is on aspirin Plavix will need to confirm if this is okay with interventional radiology; They are able to do the Thoracentesis and he had 2.4 Liters removed from the Right and will have the Left one done tomorrow -Maintain O2 >92% and wean as tolerated with IV diuresis as above -We will need an ambulatory home O2 screen prior to discharge and a repeat chest x-ray -Continuous pulse oximetry maintain O2 saturation greater 90% -Continue supplemental oxygen via nasal cannula and wean to room air if able  AKI (acute kidney injury) ([place]) -Creatinine of 1.56 from prior of 1.36. Suspect more vascular congestion. Monitor closely with diuresis.  BUN/creatinine went up slightly and is now 24/1.62 -> 26/1.73 -> 26/1.62 -Avoid further nephrotoxic medications, contrast dyes, hypotension and  renally dose medications -Repeat CMP in a.m.   Chronic osteomyelitis ([place]) -Follows with Podiatry.  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  He has plans for transmetatarsal amputation of the left foot in April with Podiatry.   Hyponatremia -Sodium of 129 on Admission.  Suspect due to hypervolemia follow with repeat in the morning after diuresis. -Sodium is now slightly improved 130 x2 -> 132 -Continue to monitor and trend and repeat CMP in a.m.  Pleural effusion due to CHF (congestive heart failure) ([place]) -CXR 06/19/21 showed  "Overall slightly worsened aeration, with increased interstitial edema and right greater than left airspace disease. The airspace disease could represent atelectasis or concurrent pneumonia. Enlargement of right larger than left pleural effusions. Aortic Atherosclerosis." -Had a Thoracentesis on the Right yesterday that yielded 2.4 Liters of Amber Fluid -Left Side to be done today -CXR this AM showed "Worsening aeration of both lungs compatible with increased pulmonary edema and pleural effusions." -Repeat CXR in the AM       Epidural abscess -Had an MSSA Bacteremia and an Epidural Abscess Extending from T12-L4/L5 -He on 04/26/2020 showed no vegetation and EF of 60 to 65% with moderate calcified mitral stenosis and then he later discovered to have osteomyelitis of the left fourth distal phalanx. -Initiate treated with cefepime but had right hand jerking and was discontinued by neurology and ultimately placed on IV cefazolin and oral ciprofloxacin per ID and he completed this course on 06/14/2021   MSSA bacteremia -See Epidural Abscess  Peripheral vascular disease ([place]) -Continue Aspirin and Plavix -Cards consulted and he has absent pedal pulses and follows with Dr. [name] of Podiatry and plan is for Surgical Intervention on the 4th Toe in April with a Transmetatarsal Amputation of the Left Foot in April   -Patient to have plan for repeat Angiography per Dr. [name]   Type  2 diabetes mellitus with unspecified complications ([place]) -Start sensitive SSI for now and will adjust -CBGs ranging from 159-235   Essential hypertension -Continued Diltiazem but have now discontinued  -C/w Bisoprolol 2.5 mg po Daily, Furosemide IV 40 mg Daily -Continue to Monitor BP per Protocol -Last BP reading was 101/50  DVT prophylaxis: enoxaparin (LOVENOX) injection 40 mg Start: 06/18/21 2015    Code Status: Full Code Family Communication:   Disposition Plan:  Level of care: Telemetry Cardiac Status is:  Inpatient Remains inpatient appropriate because: Still remains extremely volume overloaded and will need diuresis and wean off of O2 prior to safe discharge disposition   Consultants:  Cardiology  Procedures:  Thoracentesis Cardiac MRI  Antimicrobials:  Anti-infectives (From admission, onward)    None       Subjective: Seen and examined at bedside and thinks he is doing a little bit better today but still short of breath.  Continues to wear supplemental oxygen but thinks his legs are less swollen.  Thinks is a little bit easier for him to breathe today after his procedure.  Denies any chest pain.  No other concerns or complaints at this time.  Objective: Vitals:   06/20/21 2355 06/21/21 0422 06/21/21 0828 06/21/21 1046  BP: 122/63 122/63 121/64 (!) 101/50  Pulse: 91 91 88 92  Resp: '20 20 20 20  '$ Temp: 98.2 F (36.8 C) 97.9 F (36.6 C) 97.9 F (36.6 C) 98.6 F (37 C)  TempSrc: Oral Oral Oral Oral  SpO2: 93% 94% 98% 95%  Weight:      Height:        Intake/Output Summary (Last 24 hours) at 06/21/2021 1213 Last data filed at 06/21/2021 0434 Gross per 24 hour  Intake 720 ml  Output 1800 ml  Net -1080 ml   Filed Weights   06/18/21 1521 06/18/21 2110 06/19/21 0401  Weight: 86.6 kg 86.4 kg 85.5 kg   Examination: Physical Exam:  Constitutional: WN/WD overweight Caucasian male currently no acute distress appears calm but a little dyspneic Respiratory: Diminished to auscultation bilaterally with coarse breath sounds and some crackles and some slight rales but no appreciable wheezing or rhonchi noted.,Normal respiratory effort but he is slightly dyspneic on conversation. No accessory muscle use.  Wearing supplemental oxygen via nasal cannula Cardiovascular: RRR, no murmurs / rubs / gallops. S1 and S2 auscultated.  Has 1+ lower extremity edema which is improving Abdomen: Soft, non-tender, distended secondary to body habitus. Bowel sounds positive.  GU:  Deferred. Musculoskeletal: Noted his toe amputations. Neurologic: CN 2-12 grossly intact with no focal deficits.   Data Reviewed: I have personally reviewed following labs and imaging studies  CBC: Recent Labs  Lab 06/18/21 1505 06/19/21 0125 06/20/21 0310 06/21/21 0307  WBC 9.9 8.8 8.2 8.0  NEUTROABS 8.4* 6.7 6.5 6.5  HGB 12.9* 11.7* 13.5 13.2  HCT 38.5* 36.3* 40.4 38.7*  MCV 86.9 88.1 86.5 85.4  PLT 192 184 200 332   Basic Metabolic Panel: Recent Labs  Lab 06/18/21 1643 06/19/21 0125 06/20/21 0310 06/21/21 0307  NA 129* 130* 130* 132*  K 4.1 3.9 3.9 3.9  CL 90* 91* 88* 89*  CO2 27 29 34* 33*  GLUCOSE 109* 90 196* 169*  BUN 26* 24* 26* 26*  CREATININE 1.56* 1.62* 1.73* 1.62*  CALCIUM 8.5* 8.3* 8.6* 8.4*  MG  --   --  1.8 2.1  PHOS  --   --  4.4 4.1   GFR: Estimated Creatinine Clearance: 40.6 mL/min (A) (by C-G  formula based on SCr of 1.62 mg/dL (H)). Liver Function Tests: Recent Labs  Lab 06/19/21 0125 06/20/21 0310 06/21/21 0307  AST 17 18 13*  ALT '7 9 8  '$ ALKPHOS 51 55 50  BILITOT 0.8 0.6 0.7  PROT 5.6* 6.3* 5.6*  ALBUMIN 2.7* 2.9* 2.6*   No results for input(s): LIPASE, AMYLASE in the last 168 hours. No results for input(s): AMMONIA in the last 168 hours. Coagulation Profile: No results for input(s): INR, PROTIME in the last 168 hours. Cardiac Enzymes: No results for input(s): CKTOTAL, CKMB, CKMBINDEX, TROPONINI in the last 168 hours. BNP (last 3 results) No results for input(s): PROBNP in the last 8760 hours. HbA1C: No results for input(s): HGBA1C in the last 72 hours. CBG: Recent Labs  Lab 06/20/21 0549 06/20/21 1106 06/20/21 1719 06/20/21 2130 06/21/21 0602  GLUCAP 156* 226* 180* 235* 159*   Lipid Profile: Recent Labs    06/20/21 0310  CHOL 155  HDL 41  LDLCALC 97  TRIG 84  CHOLHDL 3.8   Thyroid Function Tests: No results for input(s): TSH, T4TOTAL, FREET4, T3FREE, THYROIDAB in the last 72 hours. Anemia Panel: Recent Labs     06/20/21 0310  VITAMINB12 205  FOLATE 22.4  FERRITIN 391*  TIBC 274  IRON 26*  RETICCTPCT 1.6   Sepsis Labs: No results for input(s): PROCALCITON, LATICACIDVEN in the last 168 hours.  Recent Results (from the past 240 hour(s))  Resp Panel by RT-PCR (Flu A&B, Covid) Nasopharyngeal Swab     Status: None   Collection Time: 06/18/21  3:17 PM   Specimen: Nasopharyngeal Swab; Nasopharyngeal(NP) swabs in vial transport medium  Result Value Ref Range Status   SARS Coronavirus 2 by RT PCR NEGATIVE NEGATIVE Final    Comment: (NOTE) SARS-CoV-2 target nucleic acids are NOT DETECTED.  The SARS-CoV-2 RNA is generally detectable in upper respiratory specimens during the acute phase of infection. The lowest concentration of SARS-CoV-2 viral copies this assay can detect is 138 copies/mL. A negative result does not preclude SARS-Cov-2 infection and should not be used as the sole basis for treatment or other patient management decisions. A negative result may occur with  improper specimen collection/handling, submission of specimen other than nasopharyngeal swab, presence of viral mutation(s) within the areas targeted by this assay, and inadequate number of viral copies(<138 copies/mL). A negative result must be combined with clinical observations, patient history, and epidemiological information. The expected result is Negative.  Fact Sheet for Patients:  EntrepreneurPulse.com.au  Fact Sheet for Healthcare Providers:  IncredibleEmployment.be  This test is no t yet approved or cleared by the [place] FDA and  has been authorized for detection and/or diagnosis of SARS-CoV-2 by FDA under an Emergency Use Authorization (EUA). This EUA will remain  in effect (meaning this test can be used) for the duration of the COVID-19 declaration under Section 564(b)(1) of the Act, 21 U.S.C.section 360bbb-3(b)(1), unless the authorization is terminated  or revoked  sooner.       Influenza A by PCR NEGATIVE NEGATIVE Final   Influenza B by PCR NEGATIVE NEGATIVE Final    Comment: (NOTE) The Xpert Xpress SARS-CoV-2/FLU/RSV plus assay is intended as an aid in the diagnosis of influenza from Nasopharyngeal swab specimens and should not be used as a sole basis for treatment. Nasal washings and aspirates are unacceptable for Xpert Xpress SARS-CoV-2/FLU/RSV testing.  Fact Sheet for Patients: EntrepreneurPulse.com.au  Fact Sheet for Healthcare Providers: IncredibleEmployment.be  This test is not yet approved or cleared by the [place]  States FDA and has been authorized for detection and/or diagnosis of SARS-CoV-2 by FDA under an Emergency Use Authorization (EUA). This EUA will remain in effect (meaning this test can be used) for the duration of the COVID-19 declaration under Section 564(b)(1) of the Act, 21 U.S.C. section 360bbb-3(b)(1), unless the authorization is terminated or revoked.  Performed at [facility], [place] [address], [place], [place] [address]   Culture, blood (routine x 2)     Status: None (Preliminary result)   Collection Time: 06/18/21  5:15 PM   Specimen: BLOOD  Result Value Ref Range Status   Specimen Description BLOOD SITE NOT SPECIFIED  Final   Special Requests   Final    BOTTLES DRAWN AEROBIC AND ANAEROBIC Blood Culture adequate volume   Culture   Final    NO GROWTH 3 DAYS Performed at [facility], [address] [place], [place] [address]    Report Status PENDING  Incomplete  Culture, blood (routine x 2)     Status: None (Preliminary result)   Collection Time: 06/18/21  5:20 PM   Specimen: BLOOD  Result Value Ref Range Status   Specimen Description BLOOD SITE NOT SPECIFIED  Final   Special Requests   Final    BOTTLES DRAWN AEROBIC AND ANAEROBIC Blood Culture adequate volume   Culture   Final    NO GROWTH 3 DAYS Performed at [facility], [address]  [place], [place] [address]    Report Status PENDING  Incomplete  Body fluid culture w Gram Stain     Status: None (Preliminary result)   Collection Time: 06/20/21  1:44 PM   Specimen: Lung, Right Lower Lobe; Pleural Fluid  Result Value Ref Range Status   Specimen Description PLEURAL FLUID  Final   Special Requests LUNG RLL  Final   Gram Stain   Final    FEW WBC PRESENT,BOTH PMN AND MONONUCLEAR NO ORGANISMS SEEN    Culture   Final    NO GROWTH < 24 HOURS Performed at [facility], [place] [address], [place], [place] [address]    Report Status PENDING  Incomplete    Radiology Studies: DG Chest 1 View  Result Date: 06/20/2021 CLINICAL DATA:  Follow-up pleural effusions. Status post right thoracentesis. EXAM: CHEST  1 VIEW COMPARISON:  Prior today FINDINGS: Previously seen right pleural effusion has nearly completely resolved since prior study. No pneumothorax visualized. Decreased atelectasis seen in the right lung base. Persistent consolidation or collapse is seen in the left lower lobe. Small left pleural effusion is unchanged. Pulmonary interstitial prominence remains stable, and mild interstitial edema cannot be excluded. Stable heart size. IMPRESSION: Near complete resolution of right pleural effusion. No pneumothorax visualized. Decreased right basilar atelectasis. Stable left lower lobe consolidation or collapse, and small left pleural effusion. Electronically Signed   By: Marlaine Hind M.D.   On: 06/20/2021 13:27   MR FOOT LEFT WO CONTRAST  Result Date: 06/21/2021 CLINICAL DATA:  Osteomyelitis EXAM: MRI OF THE LEFT FOOT WITHOUT CONTRAST TECHNIQUE: Multiplanar, multisequence MR imaging of the left foot was performed. No intravenous contrast was administered. COMPARISON:  MRI left foot 04/22/2021, left foot radiograph 04/18/2021 FINDINGS: Bones/Joint/Cartilage Prior first and second toe amputations. There is an angulated fracture deformity of the third toe proximal phalanx at the distal  aspect, with increased edema. There is intense marrow edema with low T1 signal of the fourth toe distal phalanx. No signal abnormality involving the fifth toe. Moderate  first metatarsophalangeal joint degenerative change mild-to-moderate first tarsometatarsal and third through fifth toe. Ligaments Intact Lisfranc ligament. Muscles and Tendons Postsurgical changes involving the first and second toe tendons. The other flexor extensor tendons are unremarkable. Intramuscular edema muscle and atrophy in the foot likely rated to diabetic muscle changes. Soft tissues Soft tissue ulceration at the tip of the fourth toe which appears to extend to the bone. No defined/drainable abscess. IMPRESSION: Osteomyelitis of the fourth toe distal phalanx with adjacent soft tissue ulcer. No drainable abscess. Chronic fracture deformity of the third toe proximal phalanx with increased associated edema and low T1 signal in comparison to prior. This could represent osteomyelitis, correlate with presence of adjacent ulcer. Electronically Signed   By: Maurine Simmering M.D.   On: 06/21/2021 08:17   DG CHEST PORT 1 VIEW  Result Date: 06/21/2021 CLINICAL DATA:  Shortness of breath. EXAM: PORTABLE CHEST 1 VIEW COMPARISON:  06/20/2021 FINDINGS: Heart size is normal. Bilateral pleural effusions and mild diffuse interstitial edema have increased in the interval. There is progressive veil like opacification of both lungs. Osseous structures appear intact. IMPRESSION: 1. Worsening aeration of both lungs compatible with increased pulmonary edema and pleural effusions. Electronically Signed   By: Kerby Moors M.D.   On: 06/21/2021 06:07   DG CHEST PORT 1 VIEW  Result Date: 06/20/2021 CLINICAL DATA:  Shortness of breath. EXAM: PORTABLE CHEST 1 VIEW COMPARISON:  06/19/2021 FINDINGS: The patient is rotated to the right on today's radiograph, reducing diagnostic sensitivity and specificity. Low lung volumes are present, causing crowding of the  pulmonary vasculature. Continued obscuration of both hemidiaphragms with confluent bandlike density in the right mid lung probably from fluid in the minor fissure or perifissural airspace opacity. Overall this appearance is not substantially changed. Atherosclerotic calcification of the aortic arch. Thoracic spondylosis. IMPRESSION: 1. Stable appearance of bibasilar and right midlung airspace opacities. A component of these opacities is attributable to the moderate-sized bilateral pleural effusions shown on the lateral radiograph of 06/18/2021. 2. Low lung volumes. 3.  Aortic Atherosclerosis (ICD10-I70.0). Electronically Signed   By: Van Clines M.D.   On: 06/20/2021 09:17   US THORACENTESIS ASP PLEURAL SPACE W/IMG GUIDE  Result Date: 06/20/2021 INDICATION: Congestive heart failure with bilateral pleural effusions. Request for therapeutic and diagnostic thoracentesis. EXAM: ULTRASOUND GUIDED RIGHT THORACENTESIS MEDICATIONS: 1% lidocaine 10 mL COMPLICATIONS: None immediate. PROCEDURE: An ultrasound guided thoracentesis was thoroughly discussed with the patient and questions answered. The benefits, risks, alternatives and complications were also discussed. The patient understands and wishes to proceed with the procedure. Written consent was obtained. Ultrasound was performed to localize and mark an adequate pocket of fluid in the right chest. The area was then prepped and draped in the normal sterile fashion. 1% Lidocaine was used for local anesthesia. Under ultrasound guidance a 6 Fr Safe-T-Centesis catheter was introduced. Thoracentesis was performed. The catheter was removed and a dressing applied. FINDINGS: A total of approximately 2.4 L of amber fluid was removed. Samples were sent to the laboratory as requested by the clinical team. IMPRESSION: Successful ultrasound guided right thoracentesis yielding 2.4 L of pleural fluid. No pneumothorax on post-procedure chest x-ray. Procedure performed by: Gareth Eagle, PA-C Electronically Signed   By: Markus Daft M.D.   On: 06/20/2021 14:40    Scheduled Meds:  aspirin EC  81 mg Oral Daily   bisoprolol  2.5 mg Oral Daily   clopidogrel  75 mg Oral Daily   enoxaparin (LOVENOX) injection  40 mg Subcutaneous Q24H   [  START ON 06/22/2021] furosemide  40 mg Intravenous Daily   insulin aspart  0-9 Units Subcutaneous TID WC   ivabradine  5 mg Oral BID WC   rosuvastatin  20 mg Oral Daily   Continuous Infusions:   LOS: 3 days   Raiford Noble, DO [facility] Available via Epic secure chat 7am-7pm After these hours, please refer to coverage provider listed on [email or web address] 06/21/2021, 12:13 PM

## 2021-06-21 NOTE — Assessment & Plan Note (Signed)
-  Had an MSSA Bacteremia and an Epidural Abscess Extending from T12-L4/L5 ?-He on 04/26/2020 showed no vegetation and EF of 60 to 65% with moderate calcified mitral stenosis and then he later discovered to have osteomyelitis of the left fourth distal phalanx. ?-Initiate treated with cefepime but had right hand jerking and was discontinued by neurology and ultimately placed on IV cefazolin and oral ciprofloxacin per ID and he completed this course on 06/14/2021 ? ?

## 2021-06-21 NOTE — Progress Notes (Signed)
Subjective:  ?Breathing improving ? ?S/p Rt thoracentesis 3/12 ?Lt thoracentesis today ? ?Objective:  ?Vital Signs in the last 24 hours: ?Temp:  [97.9 ?F (36.6 ?C)-98.2 ?F (36.8 ?C)] 97.9 ?F (36.6 ?C) (03/13 5697) ?Pulse Rate:  [88-92] 88 (03/13 0828) ?Resp:  [19-20] 20 (03/13 9480) ?BP: (117-122)/(63-70) 121/64 (03/13 1655) ?SpO2:  [93 %-98 %] 98 % (03/13 0828) ? ?Intake/Output from previous day: ?03/12 0701 - 03/13 0700 ?In: 840 [P.O.:840] ?Out: 2950 [Urine:2950] ? ?Physical Exam ?Vitals and nursing note reviewed.  ?Constitutional:   ?   General: He is not in acute distress. ?Neck:  ?   Vascular: JVD present.  ?Cardiovascular:  ?   Rate and Rhythm: Normal rate and regular rhythm.  ?   Heart sounds: Normal heart sounds. No murmur heard. ?Pulmonary:  ?   Effort: Pulmonary effort is normal.  ?   Breath sounds: Examination of the right-lower field reveals decreased breath sounds. Examination of the left-lower field reveals decreased breath sounds. Decreased breath sounds present. No wheezing or rales.  ?Musculoskeletal:  ?   Right lower leg: Edema (1+) present.  ?   Left lower leg: Edema (1+) present.  ? ? ?Cardiac Studies: ? ?Reviewed and interpreted: ?BMP, BNP, trop HS ?  ?Imaging/tests reviewed and independently interpreted: ?  ?Cardiac Studies: ?  ?Telemetry 06/20/2021: ?No significant arrhythmia ?  ?EKG 06/19/2021: ?Sinus tachycardia ?Anterior infarct, old ?  ?Echocardiogram 06/19/2021: ? 1. Left ventricular ejection fraction, by estimation, is 25 to 30%. The  ?left ventricle has severely decreased function. The left ventricle  ?demonstrates regional wall motion abnormalities (see scoring  ?diagram/findings for description). Left ventricular  ?diastolic function could not be evaluated. There is severe hypokinesis of  ?the left ventricular, mid-apical anteroseptal wall, anterior wall and  ?apical segment.  ? 2. Right ventricular systolic function is mildly reduced. The right  ?ventricular size is normal. There is  normal pulmonary artery systolic  ?pressure.  ? 3. Left atrial size was mildly dilated.  ? 4. Right atrial size was mildly dilated.  ? 5. The mitral valve is abnormal. Moderate mitral valve regurgitation.  ?Moderate mitral stenosis. Moderate mitral annular calcification. Mean PG 9  ?mmHg, MVA 1.34 cm2 by PHT method, likely more prominent due to resting  ?tachycardia around 104 bpm.  ? 6. Tricuspid valve regurgitation is mild to moderate.  ? 7. The aortic valve is tricuspid. There is moderate calcification of the  ?aortic valve. Aortic valve regurgitation is moderate. Aortic valve area,  ?by VTI measures 0.87 cm?Marland Kitchen Aortic valve mean gradient measures 22.0 mmHg.  ?Aortic valve Vmax measures 3.14  ?m/s. Dimensionless index 0.29.  ? 8. Moderate pleural effusion.  ? 9. Compared to previous transthoracic and transesophageal studies in  ?04/2021, LVEF is reduced from 40-45%. Regional wall motion abnormalities  ?(LAD territory) are more pronounced Mild RV dysfunctinon is new. Valvular  ?findings are relatively unchanged.  ?  ?Echocardiogram 04/26/2021: ?1. Left ventricular ejection fraction, by estimation, is 40 to 45%. The  ?left ventricle has mildly decreased function. The left ventricle  ?demonstrates regional wall motion abnormalities (see scoring  ?diagram/findings for description). Left ventricular  ?diastolic parameters are indeterminate.  ? 2. Right ventricular systolic function is normal. The right ventricular  ?size is normal. Tricuspid regurgitation signal is inadequate for assessing  ?PA pressure.  ? 3. Left atrial size was mildly dilated.  ? 4. Right atrial size was mildly dilated.  ? 5. The mitral valve is degenerative. No evidence of mitral valve  ?  regurgitation. Mild to moderate mitral stenosis. The mean mitral valve  ?gradient is 6.0 mmHg with average heart rate of 88 bpm. Moderate to severe  ?mitral annular calcification.  ? 6. The aortic valve is tricuspid. Aortic valve regurgitation is not  ?visualized.  Moderate to severe aortic valve stenosis. Aortic valve mean  ?gradient measures 28.0 mmHg. Aortic valve Vmax measures 3.82 m/s.  ? ?Conclusion(s)/Recommendation(s): No hemodynamically significant valvular  ?heart disease.  ?  ?Assessment & Recommendations: ?  ?76 y.o. Caucasian male  with hypertension, uncontrolled type 2 diabetes mellitus, peripheral artery disease, h/o osteomyelitis s/p right foot toe amputation, nonhealing wound left foot toe, h/o epidural abscess and MSSA bacteremia 04/2021, critical limb ischemia, moderate mitral and aortic stenosis, now admitted with congestive heart failure. ?  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  I recommend he probably had a myocardial infarction episode about a month ago while in nursing home. ?I personally reviewed and compared with prior transthoracic and transesophageal echocardiogram in 04/2021. ?Regional wall motion abnormalities in LAD territory are new, LVEF is significantly reduced, mild RV dysfunction is new. ?Moderate mitral stenosis and aortic stenosis relatively unchanged and unlikely to be primary cause for his cardiomyopathy. ?He will need evaluation for obstructive coronary artery disease.  This has to be timed in relation to his renal dysfunction issues. ?Given his type 2 diabetes mellitus and LV dysfunction, CABG would be recommended should he have severe obstructive multivessel disease with viable myocardium.  However, this can be challenging given his history of osteomyelitis, current nonhealing wound that could require amputation and left foot.  We will discuss more as we go along. ?Given that his renal function is slightly better today, we can use Gadolinium contrast. Cardiac MRI with Gadolinium contrast will be of more value in assessing viable myocardium, before even performing cardiac catheterization. This could help in minimizing contrast during cardiac catheterization and strategize revascularization strategies  better. ?Ordered for today. ?Reduce IV lasix from 40 mg bid to 40 mg daily. ?Add bisoprolol 2.5 mg today. ?Continue Corlanor 5 mg twice daily. ?Holding of ARB/ARNI given renal dysfunction and possibility of upcoming coronary angiography in the near future. ?Will add Farxiga after heart catheterization. ?  ?CAD: ?Suspect obstructive coronary artery disease, at least in LAD territory. ?Troponin elevation likely reflective of type II MI in the setting of heart failure, and possibly resting ischemia in the setting of suspected obstructive coronary artery disease.  No acute coronary syndrome. ?Continue aspirin, Plavix. ?Needs aggressive management of type 2 diabetes mellitus.  Long discussion with the patient regarding dietary modifications.  Defer management of hyperglycemia to primary team. ?Check lipid panel. ?Ordered Crestor 20 mg.  He reportedly has myalgias with pravastatin in the past if intolerant to Crestor, will need to consider Repatha. ?Plan for cardiac MRI today, possible heart catheterization 3/14. ? ?Hyponatremia: ?Likely delusional in the setting of decompensated heart failure. ?Improving with diuresis. ?  ?PAD: ?Absent pedal pulses on exam. ?Follows up with Dr. [name] with vascular interventional radiology. ?Plan for repeat angiography per Dr. [name] prior to anticipated amputation of left foot toes ?   ?Discussed with Dr. Alfredia Ferguson. ? ? ? ?Nigel Mormon, MD ?Pager: [phone] ?Office: [phone] ? ?

## 2021-06-21 NOTE — Progress Notes (Signed)
Heart Failure Nurse Navigator Progress Note ? ?Following to assess for HV TOC readiness.  ?Pt had thoracentesis 3/12.  ?Planned NM stress 3/13. ?[facility] 3/14 per Dr. Bonney Roussel note.  ? ?Pt intermittently in nursing facilities. Recently DC from [facility], home for 2 days, then hospitalized for CHF and SOB. Pt has planned surgery on 4th toe in April per [facility]OCN note.  ? ?Will plan to assess patient after cardiac catheterization to determine need/functionality for HV TOC.  ? ?Earnestine Leys, BSN, RN ?Heart Failure Nurse Navigator ?[phone] ? ?

## 2021-06-21 NOTE — Care Management Important Message (Signed)
Important Message ? ?Patient Details  ?Name: Zachary [name] ?MRN: [id number] ?Date of Birth: 27-Dec-1945 ? ? ?Medicare Important Message Given:  Yes ? ? ? ? ?[name] ?06/21/2021, 12:00 PM ?

## 2021-06-22 ENCOUNTER — Encounter (HOSPITAL_COMMUNITY)
Admission: EM | Disposition: A | Discharge status: Home Health | Payer: Self-pay | Source: Home / Self Care | Attending: Internal Medicine

## 2021-06-22 ENCOUNTER — Encounter (HOSPITAL_COMMUNITY): Payer: Self-pay | Admitting: Family Medicine

## 2021-06-22 ENCOUNTER — Other Ambulatory Visit (HOSPITAL_COMMUNITY): Payer: Medicare PPO

## 2021-06-22 ENCOUNTER — Inpatient Hospital Stay (HOSPITAL_COMMUNITY): Payer: Medicare PPO

## 2021-06-22 DIAGNOSIS — I340 Nonrheumatic mitral (valve) insufficiency: Secondary | ICD-10-CM

## 2021-06-22 DIAGNOSIS — I5020 Unspecified systolic (congestive) heart failure: Secondary | ICD-10-CM

## 2021-06-22 DIAGNOSIS — I2510 Atherosclerotic heart disease of native coronary artery without angina pectoris: Secondary | ICD-10-CM

## 2021-06-22 DIAGNOSIS — M8660 Other chronic osteomyelitis, unspecified site: Secondary | ICD-10-CM | POA: Diagnosis not present

## 2021-06-22 DIAGNOSIS — I350 Nonrheumatic aortic (valve) stenosis: Secondary | ICD-10-CM

## 2021-06-22 DIAGNOSIS — I342 Nonrheumatic mitral (valve) stenosis: Secondary | ICD-10-CM

## 2021-06-22 DIAGNOSIS — J9601 Acute respiratory failure with hypoxia: Secondary | ICD-10-CM | POA: Diagnosis not present

## 2021-06-22 DIAGNOSIS — N179 Acute kidney failure, unspecified: Secondary | ICD-10-CM | POA: Diagnosis not present

## 2021-06-22 DIAGNOSIS — I351 Nonrheumatic aortic (valve) insufficiency: Secondary | ICD-10-CM

## 2021-06-22 DIAGNOSIS — I5021 Acute systolic (congestive) heart failure: Secondary | ICD-10-CM | POA: Diagnosis not present

## 2021-06-22 LAB — POCT I-STAT EG7
Acid-Base Excess: 7 mmol/L — ABNORMAL HIGH (ref 0.0–2.0)
Acid-Base Excess: 9 mmol/L — ABNORMAL HIGH (ref 0.0–2.0)
Bicarbonate: 33.6 mmol/L — ABNORMAL HIGH (ref 20.0–28.0)
Bicarbonate: 35.9 mmol/L — ABNORMAL HIGH (ref 20.0–28.0)
Calcium, Ion: 1.07 mmol/L — ABNORMAL LOW (ref 1.15–1.40)
Calcium, Ion: 1.11 mmol/L — ABNORMAL LOW (ref 1.15–1.40)
HCT: 36 % — ABNORMAL LOW (ref 39.0–52.0)
HCT: 37 % — ABNORMAL LOW (ref 39.0–52.0)
Hemoglobin: 12.2 g/dL — ABNORMAL LOW (ref 13.0–17.0)
Hemoglobin: 12.6 g/dL — ABNORMAL LOW (ref 13.0–17.0)
O2 Saturation: 68 %
O2 Saturation: 70 %
Potassium: 3.8 mmol/L (ref 3.5–5.1)
Potassium: 3.8 mmol/L (ref 3.5–5.1)
Sodium: 134 mmol/L — ABNORMAL LOW (ref 135–145)
Sodium: 136 mmol/L (ref 135–145)
TCO2: 35 mmol/L — ABNORMAL HIGH (ref 22–32)
TCO2: 38 mmol/L — ABNORMAL HIGH (ref 22–32)
pCO2, Ven: 58.2 mmHg (ref 44–60)
pCO2, Ven: 60.7 mmHg — ABNORMAL HIGH (ref 44–60)
pH, Ven: 7.369 (ref 7.25–7.43)
pH, Ven: 7.38 (ref 7.25–7.43)
pO2, Ven: 38 mmHg (ref 32–45)
pO2, Ven: 39 mmHg (ref 32–45)

## 2021-06-22 LAB — POCT I-STAT 7, (LYTES, BLD GAS, ICA,H+H)
Acid-Base Excess: 9 mmol/L — ABNORMAL HIGH (ref 0.0–2.0)
Bicarbonate: 35.4 mmol/L — ABNORMAL HIGH (ref 20.0–28.0)
Calcium, Ion: 1.11 mmol/L — ABNORMAL LOW (ref 1.15–1.40)
HCT: 36 % — ABNORMAL LOW (ref 39.0–52.0)
Hemoglobin: 12.2 g/dL — ABNORMAL LOW (ref 13.0–17.0)
O2 Saturation: 98 %
Potassium: 3.8 mmol/L (ref 3.5–5.1)
Sodium: 133 mmol/L — ABNORMAL LOW (ref 135–145)
TCO2: 37 mmol/L — ABNORMAL HIGH (ref 22–32)
pCO2 arterial: 56.2 mmHg — ABNORMAL HIGH (ref 32–48)
pH, Arterial: 7.407 (ref 7.35–7.45)
pO2, Arterial: 101 mmHg (ref 83–108)

## 2021-06-22 LAB — CBC WITH DIFFERENTIAL/PLATELET
Abs Immature Granulocytes: 0.04 10*3/uL (ref 0.00–0.07)
Basophils Absolute: 0.1 10*3/uL (ref 0.0–0.1)
Basophils Relative: 1 %
Eosinophils Absolute: 0.3 10*3/uL (ref 0.0–0.5)
Eosinophils Relative: 3 %
HCT: 42 % (ref 39.0–52.0)
Hemoglobin: 13.9 g/dL (ref 13.0–17.0)
Immature Granulocytes: 1 %
Lymphocytes Relative: 10 %
Lymphs Abs: 0.9 10*3/uL (ref 0.7–4.0)
MCH: 28.7 pg (ref 26.0–34.0)
MCHC: 33.1 g/dL (ref 30.0–36.0)
MCV: 86.6 fL (ref 80.0–100.0)
Monocytes Absolute: 0.6 10*3/uL (ref 0.1–1.0)
Monocytes Relative: 7 %
Neutro Abs: 6.8 10*3/uL (ref 1.7–7.7)
Neutrophils Relative %: 78 %
Platelets: 221 10*3/uL (ref 150–400)
RBC: 4.85 MIL/uL (ref 4.22–5.81)
RDW: 13.5 % (ref 11.5–15.5)
WBC: 8.6 10*3/uL (ref 4.0–10.5)
nRBC: 0 % (ref 0.0–0.2)

## 2021-06-22 LAB — GLUCOSE, CAPILLARY
Glucose-Capillary: 172 mg/dL — ABNORMAL HIGH (ref 70–99)
Glucose-Capillary: 174 mg/dL — ABNORMAL HIGH (ref 70–99)
Glucose-Capillary: 230 mg/dL — ABNORMAL HIGH (ref 70–99)
Glucose-Capillary: 170 mg/dL — ABNORMAL HIGH (ref 70–99)

## 2021-06-22 LAB — COMPREHENSIVE METABOLIC PANEL
ALT: 9 U/L (ref 0–44)
AST: 16 U/L (ref 15–41)
Albumin: 2.8 g/dL — ABNORMAL LOW (ref 3.5–5.0)
Alkaline Phosphatase: 53 U/L (ref 38–126)
Anion gap: 11 (ref 5–15)
BUN: 29 mg/dL — ABNORMAL HIGH (ref 8–23)
CO2: 33 mmol/L — ABNORMAL HIGH (ref 22–32)
Calcium: 9 mg/dL (ref 8.9–10.3)
Chloride: 90 mmol/L — ABNORMAL LOW (ref 98–111)
Creatinine, Ser: 1.76 mg/dL — ABNORMAL HIGH (ref 0.61–1.24)
GFR, Estimated: 39 mL/min — ABNORMAL LOW (ref >60)
Glucose, Bld: 187 mg/dL — ABNORMAL HIGH (ref 70–99)
Potassium: 4 mmol/L (ref 3.5–5.1)
Sodium: 134 mmol/L — ABNORMAL LOW (ref 135–145)
Total Bilirubin: 0.8 mg/dL (ref 0.3–1.2)
Total Protein: 6.2 g/dL — ABNORMAL LOW (ref 6.5–8.1)

## 2021-06-22 LAB — ACID FAST SMEAR (AFB, MYCOBACTERIA): Acid Fast Smear: NEGATIVE

## 2021-06-22 LAB — MAGNESIUM: Magnesium: 2.1 mg/dL (ref 1.7–2.4)

## 2021-06-22 LAB — PHOSPHORUS: Phosphorus: 4.3 mg/dL (ref 2.5–4.6)

## 2021-06-22 SURGERY — RIGHT/LEFT HEART CATH AND CORONARY ANGIOGRAPHY
Anesthesia: LOCAL

## 2021-06-22 MED ORDER — FUROSEMIDE 10 MG/ML IJ SOLN
INTRAMUSCULAR | Status: AC
  Filled 2021-06-22: qty 4

## 2021-06-22 MED ORDER — ASPIRIN 81 MG PO CHEW
81.0000 mg | CHEWABLE_TABLET | ORAL | Status: AC
  Administered 2021-06-22: 81 mg via ORAL
  Filled 2021-06-22: qty 1

## 2021-06-22 MED ORDER — ONDANSETRON HCL 4 MG/2ML IJ SOLN: 4.0000 mg | Freq: Four times a day (QID) | INTRAMUSCULAR | Status: DC | PRN

## 2021-06-22 MED ORDER — HEPARIN SODIUM (PORCINE) 5000 UNIT/ML IJ SOLN
5000.0000 [IU] | Freq: Three times a day (TID) | INTRAMUSCULAR | Status: DC
  Administered 2021-06-23 – 2021-06-29 (×18): 5000 [IU] via SUBCUTANEOUS
  Filled 2021-06-22 (×18): qty 1

## 2021-06-22 MED ORDER — SODIUM CHLORIDE 0.9% FLUSH
3.0000 mL | Freq: Two times a day (BID) | INTRAVENOUS | Status: DC
  Administered 2021-06-22 – 2021-06-27 (×8): 3 mL via INTRAVENOUS

## 2021-06-22 MED ORDER — SODIUM CHLORIDE 0.9% FLUSH: 3.0000 mL | INTRAVENOUS | Status: DC | PRN

## 2021-06-22 MED ORDER — HEPARIN (PORCINE) IN NACL 1000-0.9 UT/500ML-% IV SOLN
INTRAVENOUS | Status: AC
  Filled 2021-06-22: qty 500

## 2021-06-22 MED ORDER — ACETAMINOPHEN 325 MG PO TABS
650.0000 mg | ORAL_TABLET | ORAL | Status: DC | PRN
  Administered 2021-06-24 – 2021-06-26 (×2): 650 mg via ORAL
  Filled 2021-06-22 (×2): qty 2

## 2021-06-22 MED ORDER — HEPARIN SODIUM (PORCINE) 1000 UNIT/ML IJ SOLN
INTRAMUSCULAR | Status: AC
  Filled 2021-06-22: qty 10

## 2021-06-22 MED ORDER — MIDAZOLAM HCL 2 MG/2ML IJ SOLN
INTRAMUSCULAR | Status: DC | PRN
  Administered 2021-06-22: 1 mg via INTRAVENOUS

## 2021-06-22 MED ORDER — FENTANYL CITRATE (PF) 100 MCG/2ML IJ SOLN
INTRAMUSCULAR | Status: DC | PRN
  Administered 2021-06-22: 25 ug via INTRAVENOUS

## 2021-06-22 MED ORDER — IOHEXOL 350 MG/ML SOLN
INTRAVENOUS | Status: DC | PRN
  Administered 2021-06-22: 20 mL via INTRA_ARTERIAL

## 2021-06-22 MED ORDER — LIDOCAINE HCL (PF) 1 % IJ SOLN
INTRAMUSCULAR | Status: DC | PRN
  Administered 2021-06-22 (×2): 2 mL

## 2021-06-22 MED ORDER — VERAPAMIL HCL 2.5 MG/ML IV SOLN
INTRAVENOUS | Status: AC
  Filled 2021-06-22: qty 2

## 2021-06-22 MED ORDER — ISOSORB DINITRATE-HYDRALAZINE 20-37.5 MG PO TABS
0.5000 | ORAL_TABLET | Freq: Three times a day (TID) | ORAL | Status: DC
  Administered 2021-06-22 – 2021-06-26 (×12): 0.5 via ORAL
  Filled 2021-06-22 (×12): qty 1

## 2021-06-22 MED ORDER — MIDAZOLAM HCL 2 MG/2ML IJ SOLN
INTRAMUSCULAR | Status: AC
  Filled 2021-06-22: qty 2

## 2021-06-22 MED ORDER — LIDOCAINE HCL (PF) 1 % IJ SOLN
INTRAMUSCULAR | Status: AC
  Filled 2021-06-22: qty 30

## 2021-06-22 MED ORDER — SODIUM CHLORIDE 0.9 % IV SOLN: 250.0000 mL | INTRAVENOUS | Status: DC | PRN

## 2021-06-22 MED ORDER — SODIUM CHLORIDE 0.9 % IV SOLN: INTRAVENOUS | Status: DC

## 2021-06-22 MED ORDER — HEPARIN (PORCINE) IN NACL 1000-0.9 UT/500ML-% IV SOLN
INTRAVENOUS | Status: DC | PRN
Start: 2021-06-22 — End: 2021-06-22
  Administered 2021-06-22 (×2): 500 mL

## 2021-06-22 MED ORDER — SODIUM CHLORIDE 0.9% FLUSH: 3.0000 mL | Freq: Two times a day (BID) | INTRAVENOUS | Status: DC

## 2021-06-22 MED ORDER — LABETALOL HCL 5 MG/ML IV SOLN: 10.0000 mg | INTRAVENOUS | Status: AC | PRN

## 2021-06-22 MED ORDER — HYDRALAZINE HCL 20 MG/ML IJ SOLN: 10.0000 mg | INTRAMUSCULAR | Status: AC | PRN

## 2021-06-22 MED ORDER — FUROSEMIDE 10 MG/ML IJ SOLN
40.0000 mg | Freq: Two times a day (BID) | INTRAMUSCULAR | Status: DC
  Administered 2021-06-22 – 2021-06-23 (×3): 40 mg via INTRAVENOUS
  Filled 2021-06-22 (×3): qty 4

## 2021-06-22 MED ORDER — FENTANYL CITRATE (PF) 100 MCG/2ML IJ SOLN
INTRAMUSCULAR | Status: AC
Start: 2021-06-22 — End: ?
  Filled 2021-06-22: qty 2

## 2021-06-22 MED ORDER — VERAPAMIL HCL 2.5 MG/ML IV SOLN
INTRAVENOUS | Status: DC | PRN
  Administered 2021-06-22: 10 mL via INTRA_ARTERIAL

## 2021-06-22 MED ORDER — HEPARIN SODIUM (PORCINE) 1000 UNIT/ML IJ SOLN
INTRAMUSCULAR | Status: DC | PRN
  Administered 2021-06-22: 4000 [IU] via INTRAVENOUS

## 2021-06-22 MED ORDER — FUROSEMIDE 10 MG/ML IJ SOLN
INTRAMUSCULAR | Status: DC | PRN
  Administered 2021-06-22: 40 mg via INTRAVENOUS

## 2021-06-22 MED ORDER — SODIUM CHLORIDE 0.9% FLUSH
3.0000 mL | INTRAVENOUS | Status: DC | PRN
  Administered 2021-06-25: 3 mL via INTRAVENOUS

## 2021-06-22 SURGICAL SUPPLY — 13 items
BAND CMPR LRG ZPHR (HEMOSTASIS) ×1
BAND ZEPHYR COMPRESS 30 LONG (HEMOSTASIS) ×1 IMPLANT
CATH 5FR JL3.5 JR4 ANG PIG MP (CATHETERS) ×1 IMPLANT
CATH BALLN WEDGE 5F 110CM (CATHETERS) ×1 IMPLANT
GLIDESHEATH SLEND A-KIT 6F 22G (SHEATH) ×1 IMPLANT
GUIDEWIRE INQWIRE 1.5J.035X260 (WIRE) IMPLANT
INQWIRE 1.5J .035X260CM (WIRE) ×2
KIT HEART LEFT (KITS) ×2 IMPLANT
PACK CARDIAC CATHETERIZATION (CUSTOM PROCEDURE TRAY) ×2 IMPLANT
SHEATH GLIDE SLENDER 4/5FR (SHEATH) ×1 IMPLANT
SHEATH PROBE COVER 6X72 (BAG) ×1 IMPLANT
TRANSDUCER W/STOPCOCK (MISCELLANEOUS) ×2 IMPLANT
TUBING CIL FLEX 10 FLL-RA (TUBING) ×2 IMPLANT

## 2021-06-22 NOTE — Progress Notes (Signed)
Heart Failure Navigator Progress Note ? ?Following this hospitalization to assess for HV TOC readiness.  ? ?Plan of care : CVTS consulted for potential surgery after R/LHC done on 06/22/21.  ? ?Will plan to schedule HVTOC appointment closer to discharge.  ? ?Earnestine Leys, BSN, RN ?Heart Failure Nurse Navigator ?[phone]  ?

## 2021-06-22 NOTE — Progress Notes (Signed)
Pt received from cath lab. VSS. R radial TR band level 0. R brachial site level 0. Call light in reach. ? ?Clyde Canterbury, RN ? ?

## 2021-06-22 NOTE — Progress Notes (Signed)
?PROGRESS NOTE ? ? ? ?Zachary [name]  TOI:[id number] DOB: [birth year] DOA: 06/18/2021 ?PCP: [name], FNP  ? ?Brief Narrative:  ?HPI per Dr. Ileene Musa ?Zachary [name] is a 76 y.o. male with medical history significant of uncontrolled type 2 diabetes, hypertension, hyperlipidemia, PVD, osteomyelitis, MSSA bacteremia who presents with concerns of increasing shortness of breath. ?  ?Reports for the past 3 days he has noticed progressive increasing shortness of breath with exertion.  Also feels very edematous with lower extremity swelling.  Denies any chest pain.  No cough or fever.  Patient was just discharged from rehab several days ago following a prolonged hospital course. ?  ?He was hospitalized from 1/8 to 1/19 after he presented with bilateral leg pain and initially was found to have osteomyelitis of the right great toe s/p amputation on 1/10 by podiatry.  Later found to have MSSA bacteremia, E. 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  TEE on 1/16 shows no vegetation, EF of 60 to 65% with moderate calcified mitral stenosis.  Also later discovered to have osteomyelitis of the left fourth distal phalanx. Initially treated on cefepime but right hand jerking and was discontinued by neurology.  Ultimately was placed on IV cefazolin and oral ciprofloxacin per ID and completed on 06/14/2021. ?  ?In the ED, he was afebrile but hypoxic requiring 2 L via nasal cannula.  No leukocytosis sodium of 129, K of 4.1, creatinine of 1.56 from prior of 1.36.  BNP of 2098.  Troponin of 45 and 33.   ?EKG on my review shows right axis deviation with intraventricular conduction delay. ?  ?**Interim History ?Cardiology was consulted and patient was found to have heart failure reduced ejection fraction with regional wall motion abnormalities and a mild right ventricular dysfunction that was new.  Given his comorbidities cardiology recommended Lasix IV twice daily and adding [name] nor 5 mg twice daily and holding  beta-blocker due to decompensated heart failure and adding [place].  They are recommending a nuclear stress test to evaluate for ischemia versus infarction on Monday, 06/21/2021 and then performing a right and left heart cath and coronary angiography on 06/22/2021.  Given that his renal function is slightly better today cardiology is going to forego his nuclear medicine scan and recommending a cardiac MRI with gadolinium contrast to assess viable myocardium prior to performing cardiac catheterization. ? ?Continues to be diuresed but continues to be short of breath.  Given that he has bilateral pleural effusions we will consult interventional radiology for thoracentesis and he had a right-sided thoracentesis that yielded 2.4 L of amber-colored fluid and will do the left side tomorrow.  PT OT recommending home health ? ?06/21/2021: He has not reduced his Lasix from IV 40 mg twice daily to IV 40 mg daily and added bisoprolol 2.5 mg today ? ?06/22/2021: Patient underwent cardiac catheterization today which showed that he had severe disease so cardiology has consulted cardiothoracic surgery for further evaluation and have discontinued his Plavix for now.  Cardiology recommends continued optimization for his heart failure with reduced ejection fraction.  The patient was seen by the cardiothoracic surgery team and he was felt to be a high risk coronary arterial surgical revascularization candidate but the cardiothoracic surgeon will determine feasibility of proceeding with surgical intervention at this time.  ? ? ?Assessment and Plan: ?* Acute systolic CHF (congestive heart failure) ([place]) ?-Found to have Systolic Dysfunction ?CXR with bilateral pleural effusion with hypoxia and LE edema on exam ?-Recent  TEE on 1/16 shows no vegetation, EF of 60 to 65% with moderate calcified mitral stenosis. Pt recently completed 8 week course of IV cefazolin and Cipro on 3/6. No fever or leukocytosis to suggest ongoing infection. ?-Repeat  ECHO showed an EF of 25 to 30% with severely decreased left ventricular function and left ventricular diastolic function could not be evaluated.  He has mild RV dysfunction as well that was new and valvular findings are relatively unchanged ?-Lasix IV '40mg'$  daily increased to BID and now they will decrease to Daily however will increase Back to BID  ?-Patient is Negative 3.655 Liters  ?-Strict intake and output, daily weights ?-Cardiology was consulted and they had initially recommended nuclear medicine stress test on Monday but he underwent Cardiac MRI yesterday and showed "Severely Reduced LV function with regional wall motion abnormalities. Apex does not appear viable, other LV segments appear viable. Mixed and multiple valve disease (aortic, mitral) as described above." ?-Cardiology recommends adding Corlanor 5 mg twice daily and were holding beta-blocker due to decompensated heart failure but now they are adding Bisoprolol 2.5 mg Daily  ?-Currently holding of ARB and ARNI given possible cath and recommending adding Farxiga and after the cath ?-Cath done and showed multiple vessel disease and so TCTS was consulted and will make final decisions about surgical re-vascularization ? ?Acute respiratory failure with hypoxia ([place]) ?-Secondary to new CHF exacerbation. Admit on 2L  ?-SpO2: 100 % ?O2 Flow Rate (L/min): 2 L/min ?-Has bilateral pleural effusions worse on the right compared to left and may benefit from thoracentesis but he is on aspirin Plavix will need to confirm if this is okay with interventional radiology; They are able to do the Thoracentesis and he had 2.4 Liters removed from the Right and will have the Left one done at sometime  ?-Maintain O2 >92% and wean as tolerated with IV diuresis as above ?-We will need an ambulatory home O2 screen prior to discharge and a repeat chest x-ray ?-Continuous pulse oximetry maintain O2 saturation greater 90% ?-Continue supplemental oxygen via nasal cannula and wean to  room air if able ? ?AKI (acute kidney injury) ([place]) ?-Creatinine of 1.56 from prior of 1.36. Suspect more vascular congestion. Monitor closely with diuresis.  BUN/creatinine went up slightly and is now 24/1.62 -> 26/1.73 -> 26/1.62 -> 29/1.76 ?-Avoid further nephrotoxic medications, contrast dyes, hypotension and  renally dose medications ?-Repeat CMP in a.m. ? ? ?Chronic osteomyelitis ([place]) ?-Follows with Podiatry.  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  He has plans for transmetatarsal amputation of the left foot in April with Podiatry. ? ? ?Hyponatremia ?-Sodium of 129 on Admission.  Suspect due to hypervolemia follow with repeat in the morning after diuresis. ?-Sodium is now slightly improved 130 x2 -> 132 -> 134 ?-Continue to monitor and trend and repeat CMP in a.m. ? ?Pleural effusion due to CHF (congestive heart failure) ([place]) ?-CXR 06/19/21 showed "Overall slightly worsened aeration, with increased interstitial edema and right greater than left airspace disease. The airspace disease could represent atelectasis or concurrent pneumonia. Enlargement of right larger than left pleural effusions. Aortic Atherosclerosis." ?-Had a Thoracentesis on the Right 06/20/21 that yielded 2.4 Liters of Amber Fluid ?-Left Side to be done and still pending  ?-CXR this AM showed "Unchanged lower lung predominant opacities which are likely combination of pulmonary edema pleural effusions." ?-Repeat CXR in the AM  ?  ?  ? ?  Epidural abscess ?-Had an MSSA Bacteremia and an Epidural Abscess Extending from T12-L4/L5 ?-He on 04/26/2020 showed no vegetation and EF of 60 to 65% with moderate calcified mitral stenosis and then he later discovered to have osteomyelitis of the left fourth distal phalanx. ?-Initiate treated with cefepime but had right hand jerking and was discontinued by neurology and ultimately placed on IV cefazolin and oral  ciprofloxacin per ID and he completed this course on 06/14/2021 ? ? ?MSSA bacteremia ?-See Epidural Abscess ? ?Peripheral vascular disease ([place]) ?-Continued Aspirin but held Plavix ?-Cards consulted and he has abs

## 2021-06-22 NOTE — Interval H&P Note (Signed)
History and Physical Interval Note: ? ?06/22/2021 ?7:39 AM ? ?Zachary [name]  has presented today for surgery, with the diagnosis of heart failure.  The various methods of treatment have been discussed with the patient and family. After consideration of risks, benefits and other options for treatment, the patient has consented to  Procedure(s): ?RIGHT/LEFT HEART CATH AND CORONARY ANGIOGRAPHY (N/A) as a surgical intervention.  The patient's history has been reviewed, patient examined, no change in status, stable for surgery.  I have reviewed the patient's chart and labs.  Questions were answered to the patient's satisfaction.   ? ?2012 Appropriate Use Criteria for Diagnostic Catheterization ?Cardiomyopathies ?(Right and Left Heart Catheterization OR ?Right Heart Catheterization Alone With/Without Left Ventriculography and Coronary Angiography) ?Indication: ? ?Known or suspected cardiomyopathy with or without heart failure ?A (7) Indication: 93; Score 7 ?291 ? ?[place] ? ? ?

## 2021-06-22 NOTE — Consult Note (Addendum)
? ?   ?[address] ?      [place] [address] ?            [phone]       ? ?L[place]lee Market ?[place] Record #[id number] ?Date of Birth: [birth year] ? ?Referring: No ref. provider found ?Primary Care: [name], FNP ?Primary Cardiologist:None ? ?Chief Complaint:    ?Chief Complaint  ?Patient presents with  ? Shortness of Breath  ? ? ?History of Present Illness:    Patient is a 76 year old male with a history of significant cardiac risk factors and other comorbidities we are asked to see in cardiothoracic surgical consultation for consideration of surgical revascularization.  The patient has known hypertension, poorly controlled type 2 diabetes mellitus, PAD with history of osteomyelitis and right toe amputation as well as nonhealing wounds to the left foot(osteomyelitis).  He also has a previous history of an epidural abscess , MSSA bacteremia,  and E. coli UTI in January of 2023.  This was a prolonged hospitalization in which she was treated with IV cefepime/cefazolin and oral Cipro through PICC line and he also spent several weeks in a rehab/nursing facility.  He completed his course of antibiotics on 06/14/2021.  A TEE that was done during the hospitalization on 1/16 showed no vegetation, EF of 60 to 65% with moderate calcified mitral stenosis.  He returned home approximately 1 week ago and developed an episode of severe dyspnea,  orthopnea, and intermittent chest pressure episodes.  He presented to the emergency department on 06/18/2021. Symptoms were progressive over several days.  He also noted increasing swelling/edema in his lower extremities.  He denied any cough or fevers.  He was admitted for acute congestive heart failure.  It was associated with hypoxia.  He was admitted to the medical service with cardiology consultation.  He began a course of diuretics and heart failure management.  He was noted to have an acute kidney injury on admission with creatinine of 1.56  from prior of 1.36.  He also had some hyponatremia.  His most recent creatinine on today's date is 1.76 with a BUN of 29.  His sodium has improved to 134.  He does not have a leukocytosis.  He is currently not anemic.  A repeat echocardiogram was done on 06/19/2021.  Left ventricular ejection fraction by estimation was down significantly to 25 to 30%.  The left ventricle had severely decreased function.  Right ventricular systolic function was mildly reduced.  There is moderate mitral regurgitation.  There is moderate mitral stenosis.  There is moderate aortic valve regurgitation.  Please see the full report listed below.  Cardiac catheterization has been performed and this reveals severe multivessel coronary artery disease.  Please see the full report below.  He has had a cardiac MRI/viability study and there are some areas of both viability and non-viability(apex).  ? ? ? ?Current Activity/ Functional Status: ?Patient is not independent with mobility/ambulation, transfers, ADL's, IADL's. ?  ?Zubrod Score: ?At the time of surgery this patient?s most appropriate activity status/level should be described as: ?'[]'$     0    Normal activity, no symptoms ?'[]'$     1    Restricted in physical strenuous activity but ambulatory, able to do out light work ?'[]'$     2    Ambulatory and capable of self care, unable to do work activities, up and about  more than 50%  Of the time                            ?'[x]'$     3    Only limited self care, in bed greater than 50% of waking hours ?'[]'$     4    Completely disabled, no self care, confined to bed or chair ?'[]'$     5    Mor[place]und ? ?Past Medical History:  ?Diagnosis Date  ? AKI (acute kidney injury) ([place]) 06/18/2021  ? Diabetes mellitus   ? Hypercholesteremia   ? Hypertension   ? Patella fracture   ? left  ? PVD (peripheral vascular disease) ([place])   ? ? ?Past Surgical History:  ?Procedure Laterality Date  ? AMPUTATION    ? AMPUTATION  11/28/2011  ? Procedure: AMPUTATION RAY;   Surgeon: [name], MD;  Location: [facility];  Service: Orthopedics;  Laterality: Left;  left 2nd toe amputation  ? AMPUTATION TOE Right 09/14/2019  ? Procedure: AMPUTATION TOE RIGHT SECOND TOE;  Surgeon: [name], [name];  Location: [facility];  Service: Podiatry;  Laterality: Right;  ? AMPUTATION TOE Right 04/20/2021  ? Procedure: AMPUTATION GREAT TOE; APPLICATION OF SKIN GRAFT SUBSTITUTE;  Surgeon: [name], [name];  Location: [facility];  Service: Podiatry;  Laterality: Right;  ? BUBBLE STUDY  04/26/2021  ? Procedure: BUBBLE STUDY;  Surgeon: Geralynn Rile, MD;  Location: [facility];  Service: Cardiovascular;;  ? IR ANGIOGRAM EXTREMITY BILATERAL  03/03/2021  ? IR ANGIOGRAM EXTREMITY LEFT  05/14/2021  ? IR ANGIOGRAM EXTREMITY RIGHT  03/03/2021  ? IR ANGIOGRAM EXTREMITY RIGHT  04/14/2021  ? IR FEM POP ART PTA MOD SED  03/03/2021  ? IR FEM POP INC ATHEREC / STENT / PTA MOD SED  05/14/2021  ? IR INTRAVASCULAR ULTRASOUND NON CORONARY  03/03/2021  ? IR RADIOLOGIST EVAL & MGMT  09/27/2019  ? IR RADIOLOGIST EVAL & MGMT  12/18/2019  ? IR RADIOLOGIST EVAL & MGMT  02/11/2021  ? IR RADIOLOGIST EVAL & MGMT  04/09/2021  ? IR TIB-PERO ART ATHEREC INC PTA MOD SED  03/03/2021  ? IR TIB-PERO ART PTA MOD SED  04/14/2021  ? IR US GUIDE VASC ACCESS LEFT  03/03/2021  ? IR US GUIDE VASC ACCESS RIGHT  04/14/2021  ? IR US GUIDE VASC ACCESS RIGHT  05/14/2021  ? ORIF PATELLA Left 01/10/2019  ? Procedure: OPEN REDUCTION INTERNAL (ORIF) FIXATION LEFT PATELLA FRACTURE;  Surgeon: [name], MD;  Location: [facility];  Service: Orthopedics;  Laterality: Left;  ? TEE WITHOUT CARDIOVERSION N/A 04/26/2021  ? Procedure: TRANSESOPHAGEAL ECHOCARDIOGRAM (TEE);  Surgeon: Geralynn Rile, MD;  Location: [facility];  Service: Cardiovascular;  Laterality: N/A;  ? ? ?Social History  ? ?Tobacco Use  ?Smoking Status Never  ?Smokeless Tobacco Never  ?  ?Social History  ? ?Substance and Sexual Activity  ?Alcohol Use No  ? ? ? ?Allergies   ?Allergen Reactions  ? Metformin Hcl Other (See Comments)  ?  Unknown reaction  ? Pravastatin Sodium Other (See Comments)  ?  Muscle pain   ? ? ?Current Facility-Administered Medications  ?Medication Dose Route Frequency Provider Last Rate Last Admin  ? 0.9 %  sodium chloride infusion  250 mL Intravenous PRN Patwardhan, Manish J, MD      ? acetaminophen (TYLENOL) tablet 650 mg  650 mg Oral Q4H PRN Patwardhan, Reynold Bowen, MD      ?  aspirin EC tablet 81 mg  81 mg Oral Daily Tu, Ching T, DO   81 mg at 06/21/21 1326  ? bisoprolol (ZEBETA) tablet 2.5 mg  2.5 mg Oral Daily Patwardhan, Manish J, MD   2.5 mg at 06/22/21 0957  ? furosemide (LASIX) injection 40 mg  40 mg Intravenous Daily Raiford Noble Latif, DO   40 mg at 06/21/21 1326  ? furosemide (LASIX) injection 40 mg  40 mg Intravenous BID Patwardhan, Reynold Bowen, MD      ? Derrill Memo ON 06/23/2021] heparin injection 5,000 Units  5,000 Units Subcutaneous Q8H Patwardhan, Manish J, MD      ? hydrALAZINE (APRESOLINE) injection 10 mg  10 mg Intravenous Q20 Min PRN Patwardhan, Manish J, MD      ? insulin aspart (novoLOG) injection 0-9 Units  0-9 Units Subcutaneous TID WC Tu, Ching T, DO   2 Units at 06/22/21 2334  ? isosorbide-hydrALAZINE (BIDIL) 20-37.5 MG per tablet 0.5 tablet  0.5 tablet Oral TID Nigel Mormon, MD   0.5 tablet at 06/22/21 0957  ? ivabradine (CORLANOR) tablet 5 mg  5 mg Oral BID WC Patwardhan, Manish J, MD   5 mg at 06/21/21 1658  ? labetalol (NORMODYNE) injection 10 mg  10 mg Intravenous Q10 min PRN Patwardhan, Manish J, MD      ? ondansetron (ZOFRAN) injection 4 mg  4 mg Intravenous Q6H PRN Patwardhan, Manish J, MD      ? rosuvastatin (CRESTOR) tablet 20 mg  20 mg Oral Daily Patwardhan, Manish J, MD   20 mg at 06/22/21 0957  ? sodium chloride flush (NS) 0.9 % injection 3 mL  3 mL Intravenous Q12H Patwardhan, Manish J, MD   3 mL at 06/22/21 0959  ? sodium chloride flush (NS) 0.9 % injection 3 mL  3 mL Intravenous PRN Patwardhan, Reynold Bowen, MD       ? ? ?Medications Prior to Admission  ?Medication Sig Dispense Refill Last Dose  ? acetaminophen (TYLENOL) 500 MG tablet Take 1,000 mg by mouth See admin instructions. Take 1000 mg 3 times daily, may take 1000 mg ever

## 2021-06-23 ENCOUNTER — Inpatient Hospital Stay (HOSPITAL_COMMUNITY): Payer: Medicare PPO

## 2021-06-23 DIAGNOSIS — I25118 Atherosclerotic heart disease of native coronary artery with other forms of angina pectoris: Secondary | ICD-10-CM

## 2021-06-23 DIAGNOSIS — I5021 Acute systolic (congestive) heart failure: Secondary | ICD-10-CM | POA: Diagnosis not present

## 2021-06-23 DIAGNOSIS — I5020 Unspecified systolic (congestive) heart failure: Secondary | ICD-10-CM | POA: Diagnosis not present

## 2021-06-23 LAB — COMPREHENSIVE METABOLIC PANEL
ALT: 10 U/L (ref 0–44)
AST: 15 U/L (ref 15–41)
Albumin: 2.8 g/dL — ABNORMAL LOW (ref 3.5–5.0)
Alkaline Phosphatase: 50 U/L (ref 38–126)
Anion gap: 12 (ref 5–15)
BUN: 32 mg/dL — ABNORMAL HIGH (ref 8–23)
CO2: 32 mmol/L (ref 22–32)
Calcium: 8.6 mg/dL — ABNORMAL LOW (ref 8.9–10.3)
Chloride: 90 mmol/L — ABNORMAL LOW (ref 98–111)
Creatinine, Ser: 1.63 mg/dL — ABNORMAL HIGH (ref 0.61–1.24)
GFR, Estimated: 43 mL/min — ABNORMAL LOW (ref >60)
Glucose, Bld: 182 mg/dL — ABNORMAL HIGH (ref 70–99)
Potassium: 4.1 mmol/L (ref 3.5–5.1)
Sodium: 134 mmol/L — ABNORMAL LOW (ref 135–145)
Total Bilirubin: 0.6 mg/dL (ref 0.3–1.2)
Total Protein: 6.1 g/dL — ABNORMAL LOW (ref 6.5–8.1)

## 2021-06-23 LAB — BODY FLUID CELL COUNT WITH DIFFERENTIAL
Eos, Fluid: 0 %
Lymphs, Fluid: 6 %
Monocyte-Macrophage-Serous Fluid: 10 % — ABNORMAL LOW (ref 50–90)
Neutrophil Count, Fluid: 83 % — ABNORMAL HIGH (ref 0–25)
Total Nucleated Cell Count, Fluid: 1140 cu mm — ABNORMAL HIGH (ref 0–1000)

## 2021-06-23 LAB — BODY FLUID CULTURE W GRAM STAIN: Culture: NO GROWTH

## 2021-06-23 LAB — CBC WITH DIFFERENTIAL/PLATELET
Abs Immature Granulocytes: 0.03 10*3/uL (ref 0.00–0.07)
Basophils Absolute: 0.1 10*3/uL (ref 0.0–0.1)
Basophils Relative: 1 %
Eosinophils Absolute: 0.4 10*3/uL (ref 0.0–0.5)
Eosinophils Relative: 4 %
HCT: 37.7 % — ABNORMAL LOW (ref 39.0–52.0)
Hemoglobin: 12.2 g/dL — ABNORMAL LOW (ref 13.0–17.0)
Immature Granulocytes: 0 %
Lymphocytes Relative: 9 %
Lymphs Abs: 0.9 10*3/uL (ref 0.7–4.0)
MCH: 27.9 pg (ref 26.0–34.0)
MCHC: 32.4 g/dL (ref 30.0–36.0)
MCV: 86.1 fL (ref 80.0–100.0)
Monocytes Absolute: 0.7 10*3/uL (ref 0.1–1.0)
Monocytes Relative: 7 %
Neutro Abs: 7.6 10*3/uL (ref 1.7–7.7)
Neutrophils Relative %: 79 %
Platelets: 230 10*3/uL (ref 150–400)
RBC: 4.38 MIL/uL (ref 4.22–5.81)
RDW: 13.4 % (ref 11.5–15.5)
WBC: 9.7 10*3/uL (ref 4.0–10.5)
nRBC: 0 % (ref 0.0–0.2)

## 2021-06-23 LAB — AMYLASE, PLEURAL OR PERITONEAL FLUID: Amylase, Fluid: 26 U/L

## 2021-06-23 LAB — LACTATE DEHYDROGENASE, PLEURAL OR PERITONEAL FLUID: LD, Fluid: 134 U/L — ABNORMAL HIGH (ref 3–23)

## 2021-06-23 LAB — MAGNESIUM: Magnesium: 2 mg/dL (ref 1.7–2.4)

## 2021-06-23 LAB — CULTURE, BLOOD (ROUTINE X 2)
Culture: NO GROWTH
Culture: NO GROWTH
Special Requests: ADEQUATE
Special Requests: ADEQUATE

## 2021-06-23 LAB — GLUCOSE, CAPILLARY
Glucose-Capillary: 184 mg/dL — ABNORMAL HIGH (ref 70–99)
Glucose-Capillary: 211 mg/dL — ABNORMAL HIGH (ref 70–99)
Glucose-Capillary: 288 mg/dL — ABNORMAL HIGH (ref 70–99)
Glucose-Capillary: 261 mg/dL — ABNORMAL HIGH (ref 70–99)

## 2021-06-23 LAB — ALBUMIN, PLEURAL OR PERITONEAL FLUID: Albumin, Fluid: 1.8 g/dL

## 2021-06-23 LAB — GLUCOSE, PLEURAL OR PERITONEAL FLUID: Glucose, Fluid: 231 mg/dL

## 2021-06-23 LAB — PROTEIN, PLEURAL OR PERITONEAL FLUID: Total protein, fluid: 3.1 g/dL

## 2021-06-23 LAB — PHOSPHORUS: Phosphorus: 4.4 mg/dL (ref 2.5–4.6)

## 2021-06-23 MED ORDER — LIDOCAINE HCL (PF) 1 % IJ SOLN
INTRAMUSCULAR | Status: DC | PRN
  Administered 2021-06-23: 10 mL
  Administered 2021-06-25: 15 mL

## 2021-06-23 MED ORDER — LIDOCAINE HCL 1 % IJ SOLN
INTRAMUSCULAR | Status: AC
  Filled 2021-06-23: qty 20

## 2021-06-23 NOTE — Plan of Care (Signed)
  Problem: Clinical Measurements: Goal: Respiratory complications will improve Outcome: Progressing   

## 2021-06-23 NOTE — Progress Notes (Signed)
Physical Therapy Treatment ?Patient Details ?Name: Zachary [name] ?MRN: [id number] ?DOB: 1945/10/13 ?Today's Date: 06/23/2021 ? ? ?History of Present Illness Patient is a 76 y/o male who presents on 3/10 with SOB, chest pain and BLE swelling. Found to have CHF exacerbation, s/p right thoracentesis 3/12 and 3/15. 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 PMH includes DM, HTN, PVD, left great toe amp, recently with long hospital stay in 04/2021 and d/ced from rehab (only home 2 days before being readmitted). ? ?  ?PT Comments  ? ? Pt received in recliner, agreeable to therapy session and with good participation and tolerance for longer household distance gait task using RW and min guard. Pt SpO2 WFL on RA at rest but desat with minimal exertion to 86%. Pt returned to 1L O2 [place] and improved, mostly WFL on 1L, brief desat to 88% but improves with standing break and cues for pursed-lip breathing. Pt continues to benefit from PT services to progress toward functional mobility goals.   ?Recommendations for follow up therapy are one component of a multi-disciplinary discharge planning process, led by the attending physician.  Recommendations may be updated based on patient status, additional functional criteria and insurance authorization. ? ?Follow Up Recommendations ? Home health PT ?  ?  ?Assistance Recommended at Discharge Intermittent Supervision/Assistance  ?Patient can return home with the following A little help with walking and/or transfers;A little help with bathing/dressing/bathroom;Assistance with cooking/housework;Help with stairs or ramp for entrance;Assist for transportation ?  ?Equipment Recommendations ? None recommended by PT  ?  ?Recommendations for Other Services   ? ? ?  ?Precautions / Restrictions Precautions ?Precautions: Fall;Other (comment) ?Precaution Comments: s/p R/L Heart Cath 3/14; watch 02 ?Restrictions ?Weight Bearing Restrictions: No  ?  ? ?Mobility ? Bed  Mobility ?  ?   ?  ?General bed mobility comments: pt received in chair ?  ? ?Transfers ?Overall transfer level: Needs assistance ?Equipment used: Rolling walker (2 wheels) ?Transfers: Sit to/from Stand ?Sit to Stand: Min guard ?  ?  ?  ?  ?  ?General transfer comment: from chair<>RW, pt needs to use UE to stand, cues for forward momentum to reduce reliance on pushing from arm rests (post/LR heart cath) will continue to reinforce ?  ? ?Ambulation/Gait ?Ambulation/Gait assistance: Min guard ?Gait Distance (Feet): 200 Feet ?Assistive device: Rolling walker (2 wheels) ?Gait Pattern/deviations: Step-through pattern, Decreased stride length ?Gait velocity: functional ?  ?  ?General Gait Details: Slow, mostly steady gait with RW; reports LLE weakness. No evidence of imbalance, needs 1L/min 02  Sp02 desat to 86% on RA with exertion. 2/4 DOE. ? ? ? ?  ?Balance Overall balance assessment: Needs assistance ?Sitting-balance support: Feet supported, No upper extremity supported ?Sitting balance-Leahy Scale: Good ?  ?  ?Standing balance support: During functional activity, Reliant on assistive device for balance ?Standing balance-Leahy Scale:  (Fair static standing, poor dynamic standing without AD) ?Standing balance comment: able to don mask unsupported but needs RW for gait progression ?  ?  ?  ?  ?  ?  ?  ?  ?  ?  ?  ?  ? ?  ?Cognition Arousal/Alertness: Awake/alert ?Behavior During Therapy: [facility] for tasks assessed/performed ?Overall Cognitive Status: Within Functional Limits for tasks assessed ?  ?  ?  ?   ?  ?General Comments: pt able to recall no pushing/pulling precaution post-heart cath when prompted but needs cues to maintain during session; pleasantly cooperative. ?  ?  ? ?  ?  Exercises Other Exercises ?Other Exercises: IS x 10 reps achieves 200-500 mL ? ?  ?General Comments General comments (skin integrity, edema, etc.): HR 84 bpm with exertion, SpO2 99% on 1L resting, 96% on RA resting, desat to 86% on RA with  exertion, on 1L O2 [place] maintains 88-94%, when desat to 88% improves to >90% within 30 seconds of standing rest break and pursed-lip breathing. ?  ?  ? ?Pertinent Vitals/Pain Pain Assessment ?Pain Assessment: No/denies pain ?Pain Intervention(s): Monitored during session, Repositioned  ? ? ?   ?   ? ?PT Goals (current goals can now be found in the care plan section) Acute Rehab PT Goals ?PT Goal Formulation: With patient ?Time For Goal Achievement: 07/04/21 ?Progress towards PT goals: Progressing toward goals ? ?  ?Frequency ? ? ? Min 3X/week ? ? ? ?  ?PT Plan Current plan remains appropriate  ? ? ?   ?AM-PAC PT "6 Clicks" Mobility   ?Outcome Measure ? Help needed turning from your back to your side while in a flat bed without using bedrails?: None ?Help needed moving from lying on your back to sitting on the side of a flat bed without using bedrails?: None ?Help needed moving to and from a bed to a chair (including a wheelchair)?: A Little ?Help needed standing up from a chair using your arms (e.g., wheelchair or bedside chair)?: A Little ?Help needed to walk in hospital room?: A Little ?Help needed climbing 3-5 steps with a railing? : A Little ?6 Click Score: 20 ? ?  ?End of Session Equipment Utilized During Treatment: Gait belt;Oxygen ?Activity Tolerance: Patient tolerated treatment well ?Patient left: with call bell/phone within reach;with family/visitor present;in chair (son and spouse present) ?Nurse Communication: Mobility status ?PT Visit Diagnosis: Pain;Difficulty in walking, not elsewhere classified (R26.2) ?  ? ? ?Time: 1540-1600 ?PT Time Calculation (min) (ACUTE ONLY): 20 min ? ?Charges:  $Gait Training: 8-22 mins          ?          ? ?Nathanyl Andujo P., PTA ?Acute Rehabilitation Services ?Pager: [phone] ?Office: [phone]  ? ? ?Vyom Brass M Brylyn Novakovich ?06/23/2021, 5:38 PM ? ?

## 2021-06-23 NOTE — Progress Notes (Addendum)
?PROGRESS NOTE ? ? ? ?Zachary [name]  ALP:[id number] DOB: December 11, 1945 DOA: 06/18/2021 ?PCP: [name], FNP  ?Narrative 75/F with history of uncontrolled type 2 diabetes mellitus, hypertension, dyslipidemia, peripheral vascular disease, recent complicated hospitalization hospitalization with osteomyelitis, MSSA bacteremia and epidural abscess, status post right great toe amputation 1/10 ,TEE negative for endocarditis, completed prolonged course of IV cefazolin and oral ciprofloxacin on 3/6. ?-Presented to the ED with progressive dyspnea on exertion ?-Diagnosed with acute systolic CHF, EF down to 35-32%, diuresed with IV Lasix ?-Cardiology following, underwent left and right heart cath 3/14, noted to have severe multivessel CAD, T CTS consulted, Plavix discontinued ? ? ?Subjective: ?-Feels okay, better overall ? ?Assessment and Plan: ? ?* Acute systolic CHF (congestive heart failure) ([place]) ?Acute hypoxic respiratory failure ?Pleural effusions ?-ECHO showed an EF of 25 to 30%  ?-Diuresed with IV Lasix he is 3.2 L negative ?-s/p R Thoracentesis  3/12-> 2.4 Liters of Amber Fluid, fluid transudative, cultures negative ?-Cardiac MRI-"Severely Reduced LV function with regional wall motion abnormalities. Apex does not appear viable, other LV segments appear viable. ?-LHC 3/14 with severe multivessel CAD ?-Continue IV Lasix, started on bisoprolol, Corlanor, continue Imdur ?-GDMT as tolerated, creatinine stable ?-Wean down O2, increase activity, PT eval ?-L thora ordered by Dr.Sheikh yesterday ? ?Multivessel CAD ?-Continue aspirin, bisoprolol, statin ?-T CTS eval pending ? ?AKI (acute kidney injury) ([place]), CKD 3a ?-Baseline creatinine around 1.3-1.4, now in the 1.6 range ?-Likely hemodynamically mediated, continue diuretics as above ? 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  Also has osteomyelitis of the left fourth distal  phalanx.  plans for transmetatarsal amputation of the left foot in April with Podiatry. ?-Had MSSA bacteremia with epidural abscess during recent hospitalization in January, TEE was negative for endocarditis ?-Seen by infectious disease, initially treated with cefepime followed by cefazolin and oral ciprofloxacin, completed antibiotic course 3/6 ? ?Peripheral vascular disease ([place]) ?-Continued Aspirin but held Plavix ?-Cards consulted and he has absent pedal pulses and follows with Dr. [name] of Podiatry and plan is for Surgical Intervention on the 4th Toe in April with a Transmetatarsal Amputation of the Left Foot in April   ?-Patient to have plan for repeat Angiography per Dr. [name]  ? ?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 (HCC) ?-CBG stable, continue sliding scale insulin, add meal coverage ? ?Essential hypertension ?-Continue bisoprolol, diuretics as above ? ?DVT prophylaxis: Heparin subcutaneous ?Code Status: Full code ?Family Communication: Discussed with patient in detail, no family at bedside ?Disposition Plan: To be determined, pending T CTS eval ? ?Consultants:  ?Cardiology, T CTS ? ?Procedures:  ? ?Antimicrobials:  ? ? ?Objective: ?Vitals:  ? 06/22/21 2334 06/23/21 0520 06/23/21 0802 06/23/21 1139  ?BP: (!) 103/59 108/70 (!) 108/54 (!) 102/54  ?Pulse: 80 79 80 74  ?Resp: '15 16 18 18  '$ ?Temp: 97.8 ?F (36.6 ?C) 97.7 ?F (36.5 ?C) (!) 97.3 ?F (36.3 ?C) 97.8 ?F (36.6 ?C)  ?TempSrc: Oral Oral Oral Oral  ?SpO2: 97% 95% 99% 100%  ?Weight:  80.6 kg    ?Height:      ? ? ?Intake/Output Summary (Last 24 hours) at 06/23/2021 1220 ?Last data filed at 06/23/2021 0804 ?Gross per 24 hour  ?Intake 900 ml  ?Output 975 ml  ?Net -75 ml  ? ?Filed Weights  ? 06/19/21 0401 06/22/21 0335 06/23/21 0520  ?Weight: 85.5 kg 80.8 kg 80.6 kg  ? ? ?Examination: ? ?General  exam: Pleasant male sitting up in bed, AAOx3, no distress ?HEENT: No JVD ?CVS: S1-S2, regular rhythm ?Lungs: Decreased breath sounds the bases  otherwise clear ?Abdomen: Soft, nontender, bowel sounds present ?Extremities: Right great toe wound, healing well, status post amputation with dressing ?Skin: As above ?Psychiatry: Judgement and insight appear normal. Mood & affect appropriate.  ? ? ? ?Data Reviewed:  ? ?CBC: ?Recent Labs  ?Lab 06/19/21 ?0125 06/20/21 ?0310 06/21/21 ?0307 06/22/21 ?0157 06/22/21 ?3646 06/22/21 ?8032 06/23/21 ?0249  ?WBC 8.8 8.2 8.0 8.6  --   --  9.7  ?NEUTROABS 6.7 6.5 6.5 6.8  --   --  7.6  ?HGB 11.7* 13.5 13.2 13.9 12.2*  12.6* 12.2* 12.2*  ?HCT 36.3* 40.4 38.7* 42.0 36.0*  37.0* 36.0* 37.7*  ?MCV 88.1 86.5 85.4 86.6  --   --  86.1  ?PLT 184 200 191 221  --   --  230  ? ?Basic Metabolic Panel: ?Recent Labs  ?Lab 06/19/21 ?0125 06/20/21 ?0310 06/21/21 ?0307 06/22/21 ?0157 06/22/21 ?1224 06/22/21 ?8250 06/23/21 ?0249  ?NA 130* 130* 132* 134* 133*  134* 136 134*  ?K 3.9 3.9 3.9 4.0 3.8  3.8 3.8 4.1  ?CL 91* 88* 89* 90*  --   --  90*  ?CO2 29 34* 33* 33*  --   --  32  ?GLUCOSE 90 196* 169* 187*  --   --  182*  ?BUN 24* 26* 26* 29*  --   --  32*  ?CREATININE 1.62* 1.73* 1.62* 1.76*  --   --  1.63*  ?CALCIUM 8.3* 8.6* 8.4* 9.0  --   --  8.6*  ?MG  --  1.8 2.1 2.1  --   --  2.0  ?PHOS  --  4.4 4.1 4.3  --   --  4.4  ? ?GFR: ?Estimated Creatinine Clearance: 36.7 mL/min (A) (by C-G formula based on SCr of 1.63 mg/dL (H)). ?Liver Function Tests: ?Recent Labs  ?Lab 06/19/21 ?0125 06/20/21 ?0310 06/21/21 ?0307 06/22/21 ?0157 06/23/21 ?0249  ?AST 17 18 13* 16 15  ?ALT '7 9 8 9 10  '$ ?ALKPHOS 51 55 50 53 50  ?BILITOT 0.8 0.6 0.7 0.8 0.6  ?PROT 5.6* 6.3* 5.6* 6.2* 6.1*  ?ALBUMIN 2.7* 2.9* 2.6* 2.8* 2.8*  ? ?No results for input(s): LIPASE, AMYLASE in the last 168 hours. ?No results for input(s): AMMONIA in the last 168 hours. ?Coagulation Profile: ?No results for input(s): INR, PROTIME in the last 168 hours. ?Cardiac Enzymes: ?No results for input(s): CKTOTAL, CKMB, CKMBINDEX, TROPONINI in the last 168 hours. ?BNP (last 3 results) ?No results  for input(s): PROBNP in the last 8760 hours. ?HbA1C: ?No results for input(s): HGBA1C in the last 72 hours. ?CBG: ?Recent Labs  ?Lab 06/22/21 ?1121 06/22/21 ?1656 06/22/21 ?2132 06/23/21 ?0610 06/23/21 ?1138  ?GLUCAP 174* 230* 170* 184* 211*  ? ?Lipid Profile: ?No results for input(s): CHOL, HDL, LDLCALC, TRIG, CHOLHDL, LDLDIRECT in the last 72 hours. ?Thyroid Function Tests: ?No results for input(s): TSH, T4TOTAL, FREET4, T3FREE, THYROIDAB in the last 72 hours. ?Anemia Panel: ?No results for input(s): VITAMINB12, FOLATE, FERRITIN, TIBC, IRON, RETICCTPCT in the last 72 hours. ?Urine analysis: ?   ?Component Value Date/Time  ? COLORURINE YELLOW 04/18/2021 1635  ? APPEARANCEUR CLOUDY (A) 04/18/2021 1635  ? LABSPEC 1.025 04/18/2021 1635  ? PHURINE 5.5 04/18/2021 1635  ? GLUCOSEU >=500 (A) 04/18/2021 1635  ? HGBUR LARGE (A) 04/18/2021 1635  ? BILIRUBINUR NEGATIVE 04/18/2021 1635  ? [place] NEGATIVE 04/18/2021 1635  ?  PROTEINUR 100 (A) 04/18/2021 1635  ? NITRITE NEGATIVE 04/18/2021 1635  ? LEUKOCYTESUR SMALL (A) 04/18/2021 1635  ? ?Sepsis Labs: ?'@LABRCNTIP'$ (procalcitonin:4,lacticidven:4) ? ?) ?Recent Results (from the past 240 hour(s))  ?Resp Panel by RT-PCR (Flu A&B, Covid) Nasopharyngeal Swab     Status: None  ? Collection Time: 06/18/21  3:17 PM  ? Specimen: Nasopharyngeal Swab; Nasopharyngeal(NP) swabs in vial transport medium  ?Result Value Ref Range Status  ? SARS Coronavirus 2 by RT PCR NEGATIVE NEGATIVE Final  ?  Comment: (NOTE) ?SARS-CoV-2 target nucleic acids are NOT DETECTED. ? ?The SARS-CoV-2 RNA is generally detectable in upper respiratory ?specimens during the acute phase of infection. The lowest ?concentration of SARS-CoV-2 viral copies this assay can detect is ?138 copies/mL. A negative result does not preclude SARS-Cov-2 ?infection and should not be used as the sole basis for treatment or ?other patient management decisions. A negative result may occur with  ?improper specimen collection/handling,  submission of specimen other ?than nasopharyngeal swab, presence of viral mutation(s) within the ?areas targeted by this assay, and inadequate number of viral ?copies(<138 copies/mL). A negative result must be combine

## 2021-06-23 NOTE — Progress Notes (Signed)
MD: Patient has congested cough but unable to bring anything up.  ? ?Can anything be ordered?  ? ?Thank you ?nursing ?

## 2021-06-23 NOTE — Progress Notes (Signed)
Mobility Specialist Progress Note  ? ? 06/23/21 1513  ?Mobility  ?Activity Transferred from bed to chair  ?Level of Assistance Minimal assist, patient does 75% or more  ?Assistive Device Front wheel walker  ?Distance Ambulated (ft) 10 ft  ?Activity Response Tolerated well  ?$Mobility charge 1 Mobility  ? ?Left with call bell in reach and wife present.  ? ?Hildred Alamin ?Mobility Specialist  ?M.S. 5N: [phone]  ?

## 2021-06-23 NOTE — Progress Notes (Signed)
OT Cancellation Note ? ?Patient Details ?Name: Zachary [name] ?MRN: [id number] ?DOB: Dec 29, 1945 ? ? ?Cancelled Treatment:    Reason Eval/Treat Not Completed: Patient at procedure or test/ unavailable Attempted in AM with patient declining OT services and to check back in the afternoon. Patient at procedure in afternoon, will check back as OT time permits.  ? ?Corinne Ports E. Leighton Brickley, OTR/L ?Acute Rehabilitation Services ?[phone] ?[phone]  ? ?Corinne Ports Ramond Darnell ?06/23/2021, 2:03 PM ?

## 2021-06-23 NOTE — Progress Notes (Signed)
Subjective:  ?Breathing improving ? ?S/p Rt thoracentesis 3/12 ?S/p cardiac MRI 3/13 ?S/p cardiac catheterization 3/14 ? ?Objective:  ?Vital Signs in the last 24 hours: ?Temp:  [97.3 ?F (36.3 ?C)-98 ?F (36.7 ?C)] 97.3 ?F (36.3 ?C) (03/15 0802) ?Pulse Rate:  [73-80] 80 (03/15 0802) ?Resp:  [15-18] 18 (03/15 0802) ?BP: (98-117)/(54-74) 108/54 (03/15 0802) ?SpO2:  [94 %-100 %] 99 % (03/15 0802) ?Weight:  [80.6 kg] 80.6 kg (03/15 0520) ? ?Intake/Output from previous day: ?03/14 0701 - 03/15 0700 ?In: 720 [P.O.:720] ?Out: 1300 [Urine:1300] ? ?Physical Exam ?Vitals and nursing note reviewed.  ?Constitutional:   ?   General: He is not in acute distress. ?Neck:  ?   Vascular: JVD present.  ?Cardiovascular:  ?   Rate and Rhythm: Normal rate and regular rhythm.  ?   Heart sounds: Normal heart sounds. No murmur heard. ?Pulmonary:  ?   Effort: Pulmonary effort is normal.  ?   Breath sounds: Examination of the right-lower field reveals decreased breath sounds. Examination of the left-lower field reveals decreased breath sounds. Decreased breath sounds present. No wheezing or rales.  ?Musculoskeletal:  ?   Right lower leg: Edema (1+) present.  ?   Left lower leg: Edema (1+) present.  ? ? ?Cardiac Studies: ? ?Reviewed and interpreted: ?BMP, BNP, trop HS ?  ?Imaging/tests reviewed and independently interpreted: ?  ?Cardiac Studies: ?  ?Telemetry 06/20/2021: ?No significant arrhythmia ?  ?EKG 06/19/2021: ?Sinus tachycardia ?Anterior infarct, old ?  ?Echocardiogram 06/19/2021: ? 1. Left ventricular ejection fraction, by estimation, is 25 to 30%. The  ?left ventricle has severely decreased function. The left ventricle  ?demonstrates regional wall motion abnormalities (see scoring  ?diagram/findings for description). Left ventricular  ?diastolic function could not be evaluated. There is severe hypokinesis of  ?the left ventricular, mid-apical anteroseptal wall, anterior wall and  ?apical segment.  ? 2. Right ventricular systolic function  is mildly reduced. The right  ?ventricular size is normal. There is normal pulmonary artery systolic  ?pressure.  ? 3. Left atrial size was mildly dilated.  ? 4. Right atrial size was mildly dilated.  ? 5. The mitral valve is abnormal. Moderate mitral valve regurgitation.  ?Moderate mitral stenosis. Moderate mitral annular calcification. Mean PG 9  ?mmHg, MVA 1.34 cm2 by PHT method, likely more prominent due to resting  ?tachycardia around 104 bpm.  ? 6. Tricuspid valve regurgitation is mild to moderate.  ? 7. The aortic valve is tricuspid. There is moderate calcification of the  ?aortic valve. Aortic valve regurgitation is moderate. Aortic valve area,  ?by VTI measures 0.87 cm?Marland Kitchen Aortic valve mean gradient measures 22.0 mmHg.  ?Aortic valve Vmax measures 3.14  ?m/s. Dimensionless index 0.29.  ? 8. Moderate pleural effusion.  ? 9. Compared to previous transthoracic and transesophageal studies in  ?04/2021, LVEF is reduced from 40-45%. Regional wall motion abnormalities  ?(LAD territory) are more pronounced Mild RV dysfunctinon is new. Valvular  ?findings are relatively unchanged.  ?  ?Echocardiogram 04/26/2021: ?1. Left ventricular ejection fraction, by estimation, is 40 to 45%. The  ?left ventricle has mildly decreased function. The left ventricle  ?demonstrates regional wall motion abnormalities (see scoring  ?diagram/findings for description). Left ventricular  ?diastolic parameters are indeterminate.  ? 2. Right ventricular systolic function is normal. The right ventricular  ?size is normal. Tricuspid regurgitation signal is inadequate for assessing  ?PA pressure.  ? 3. Left atrial size was mildly dilated.  ? 4. Right atrial size was mildly dilated.  ?  5. The mitral valve is degenerative. No evidence of mitral valve  ?regurgitation. Mild to moderate mitral stenosis. The mean mitral valve  ?gradient is 6.0 mmHg with average heart rate of 88 bpm. Moderate to severe  ?mitral annular calcification.  ? 6. The aortic  valve is tricuspid. Aortic valve regurgitation is not  ?visualized. Moderate to severe aortic valve stenosis. Aortic valve mean  ?gradient measures 28.0 mmHg. Aortic valve Vmax measures 3.82 m/s.  ? ?Conclusion(s)/Recommendation(s): No hemodynamically significant valvular  ?heart disease.  ?  ?Assessment & Recommendations: ?  ?76 y.o. Caucasian male  with hypertension, uncontrolled type 2 diabetes mellitus, peripheral artery disease, h/o osteomyelitis s/p right foot toe amputation, nonhealing wound left foot toe, h/o epidural abscess and MSSA bacteremia 04/2021, critical limb ischemia, moderate mitral and aortic stenosis, now admitted with congestive heart failure. ?  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  It remains to be seen whether he will be candidate for surgical revascularization given his multiple other comorbidities, as well as history of infections. ?Continue IV Lasix 40 mg twice daily. ?Hold bisoprolol 2.5 mg, given low normal blood pressures. ?Instead, use BiDil half tablet 3 times daily for afterload reduction. ?Unable to use ARN I/ARB/MRA/SGLT2 i due to renal dysfunction. ?May consider adding after surgery. ?Continue strict I/O. ? ?CAD: ?Severe multivessel coronary artery disease. ?Currently no anginal symptoms at rest. ?Continue aspirin, statin.  Continue to hold Plavix pending decision on surgical revascularization. ?Beta-blocker on hold while optimizing heart failure management. ?Restart after surgery. ? ?Hyponatremia: ?Likely delusional in the setting of decompensated heart failure. ?Improving with diuresis. ?  ?PAD: ?Absent pedal pulses on exam. ?Follows up with Dr. [name] with vascular interventional radiology. ?Plan for  repeat angiography per Dr. [name] prior to anticipated amputation of left foot toes ?This may have to wait until after management of acute cardiac issues at this time. ?   ?Discussed with primary team. ? ? ? ?Nigel Mormon, MD ?Pager: [phone] ?Office: [phone] ? ?

## 2021-06-23 NOTE — Procedures (Signed)
PROCEDURE SUMMARY: ? ?Successful image-guided left thoracentesis. ?Yielded 1.1 L of hazy amber fluid. ?Pt tolerated procedure well. ?No immediate complications. ?EBL = trace  ? ?Specimen was sent for labs. ?CXR ordered. ? ?Please see imaging section of Epic for full dictation. ? ?Tera Mater PA-C ?06/23/2021 ?1:23 PM ? ? ? ?

## 2021-06-24 ENCOUNTER — Inpatient Hospital Stay (HOSPITAL_COMMUNITY): Payer: Medicare PPO

## 2021-06-24 ENCOUNTER — Other Ambulatory Visit (HOSPITAL_COMMUNITY): Payer: Self-pay

## 2021-06-24 ENCOUNTER — Encounter (HOSPITAL_COMMUNITY): Payer: Self-pay | Admitting: Family Medicine

## 2021-06-24 DIAGNOSIS — I5021 Acute systolic (congestive) heart failure: Secondary | ICD-10-CM | POA: Diagnosis not present

## 2021-06-24 DIAGNOSIS — I25118 Atherosclerotic heart disease of native coronary artery with other forms of angina pectoris: Secondary | ICD-10-CM

## 2021-06-24 LAB — COMPREHENSIVE METABOLIC PANEL
ALT: 8 U/L (ref 0–44)
AST: 13 U/L — ABNORMAL LOW (ref 15–41)
Albumin: 2.6 g/dL — ABNORMAL LOW (ref 3.5–5.0)
Alkaline Phosphatase: 44 U/L (ref 38–126)
Anion gap: 11 (ref 5–15)
BUN: 35 mg/dL — ABNORMAL HIGH (ref 8–23)
CO2: 30 mmol/L (ref 22–32)
Calcium: 8.2 mg/dL — ABNORMAL LOW (ref 8.9–10.3)
Chloride: 91 mmol/L — ABNORMAL LOW (ref 98–111)
Creatinine, Ser: 1.88 mg/dL — ABNORMAL HIGH (ref 0.61–1.24)
GFR, Estimated: 36 mL/min — ABNORMAL LOW (ref >60)
Glucose, Bld: 177 mg/dL — ABNORMAL HIGH (ref 70–99)
Potassium: 3.8 mmol/L (ref 3.5–5.1)
Sodium: 132 mmol/L — ABNORMAL LOW (ref 135–145)
Total Bilirubin: 0.7 mg/dL (ref 0.3–1.2)
Total Protein: 5.7 g/dL — ABNORMAL LOW (ref 6.5–8.1)

## 2021-06-24 LAB — CBC
HCT: 34.6 % — ABNORMAL LOW (ref 39.0–52.0)
Hemoglobin: 11.3 g/dL — ABNORMAL LOW (ref 13.0–17.0)
MCH: 28 pg (ref 26.0–34.0)
MCHC: 32.7 g/dL (ref 30.0–36.0)
MCV: 85.6 fL (ref 80.0–100.0)
Platelets: 210 10*3/uL (ref 150–400)
RBC: 4.04 MIL/uL — ABNORMAL LOW (ref 4.22–5.81)
RDW: 13.4 % (ref 11.5–15.5)
WBC: 8 10*3/uL (ref 4.0–10.5)
nRBC: 0 % (ref 0.0–0.2)

## 2021-06-24 LAB — GLUCOSE, CAPILLARY
Glucose-Capillary: 174 mg/dL — ABNORMAL HIGH (ref 70–99)
Glucose-Capillary: 245 mg/dL — ABNORMAL HIGH (ref 70–99)
Glucose-Capillary: 245 mg/dL — ABNORMAL HIGH (ref 70–99)
Glucose-Capillary: 235 mg/dL — ABNORMAL HIGH (ref 70–99)

## 2021-06-24 LAB — MISC LABCORP TEST (SEND OUT): Labcorp test code: 5367

## 2021-06-24 LAB — PROCALCITONIN: Procalcitonin: <0.1 ng/mL

## 2021-06-24 LAB — TROPONIN I (HIGH SENSITIVITY): Troponin I (High Sensitivity): 41 ng/L — ABNORMAL HIGH (ref <18)

## 2021-06-24 MED ORDER — SODIUM CHLORIDE 0.9 % IV SOLN: INTRAVENOUS | Status: DC

## 2021-06-24 MED ORDER — NITROGLYCERIN 0.4 MG SL SUBL: 0.4000 mg | SUBLINGUAL_TABLET | SUBLINGUAL | Status: DC | PRN

## 2021-06-24 MED ORDER — SODIUM CHLORIDE 0.9% FLUSH
3.0000 mL | Freq: Two times a day (BID) | INTRAVENOUS | Status: DC
  Administered 2021-06-24 – 2021-06-29 (×4): 3 mL via INTRAVENOUS

## 2021-06-24 MED ORDER — TICAGRELOR 90 MG PO TABS
90.0000 mg | ORAL_TABLET | Freq: Two times a day (BID) | ORAL | Status: DC
  Administered 2021-06-25 – 2021-06-29 (×9): 90 mg via ORAL
  Filled 2021-06-24 (×9): qty 1

## 2021-06-24 MED ORDER — SODIUM CHLORIDE 0.9 % IV SOLN: 250.0000 mL | INTRAVENOUS | Status: DC | PRN

## 2021-06-24 MED ORDER — SODIUM CHLORIDE 0.9% FLUSH
3.0000 mL | INTRAVENOUS | Status: DC | PRN
  Administered 2021-06-25: 3 mL via INTRAVENOUS

## 2021-06-24 MED ORDER — TICAGRELOR 90 MG PO TABS
180.0000 mg | ORAL_TABLET | Freq: Once | ORAL | Status: AC
  Administered 2021-06-24: 180 mg via ORAL
  Filled 2021-06-24: qty 2

## 2021-06-24 MED ORDER — ASPIRIN 81 MG PO CHEW
81.0000 mg | CHEWABLE_TABLET | ORAL | Status: AC
  Administered 2021-06-25: 81 mg via ORAL
  Filled 2021-06-24: qty 1

## 2021-06-24 NOTE — Progress Notes (Signed)
Occupational Therapy Treatment ?Patient Details ?Name: Zachary [name] ?MRN: [id number] ?DOB: 19-Dec-1945 ?Today's Date: 06/24/2021 ? ? ?History of present illness Patient is a 76 y/o male who presents on 3/10 with SOB, chest pain and BLE swelling. Found to have CHF exacerbation, s/p right thoracentesis 3/12 and 3/15. 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 PMH includes DM, HTN, PVD, left great toe amp, recently with long hospital stay in 04/2021 and d/ced from rehab (only home 2 days before being readmitted). ?  ?OT comments ? Patient making good progress with OT treatment.  Patient was able to perform simulated toilet transfers with min guard assist and performed grooming standing at sink and tolerated 4 minutes of standing. Patient had complaints of left side pain at beginning of treatment with HR at 77 and BP 112/59.  BP at end of session 110/64.  Acute OT to continue to follow.   ? ?Recommendations for follow up therapy are one component of a multi-disciplinary discharge planning process, led by the attending physician.  Recommendations may be updated based on patient status, additional functional criteria and insurance authorization. ?   ?Follow Up Recommendations ? Home health OT  ?  ?Assistance Recommended at Discharge Intermittent Supervision/Assistance  ?Patient can return home with the following ? A little help with bathing/dressing/bathroom;Assistance with cooking/housework;Assist for transportation;Help with stairs or ramp for entrance ?  ?Equipment Recommendations ? None recommended by OT  ?  ?Recommendations for Other Services   ? ?  ?Precautions / Restrictions Precautions ?Precautions: Fall;Other (comment) ?Precaution Comments: s/p R/L Heart Cath 3/14; watch 02 ?Restrictions ?Weight Bearing Restrictions: No  ? ? ?  ? ?Mobility Bed Mobility ?Overal bed mobility: Needs Assistance ?  ?  ?  ?  ?  ?  ?General bed mobility comments: seated in recliner and remained in  recliner at end of session ?  ? ?Transfers ?Overall transfer level: Needs assistance ?Equipment used: Rolling walker (2 wheels) ?Transfers: Sit to/from Stand ?Sit to Stand: Min guard ?  ?  ?  ?  ?  ?General transfer comment: min guard to power up and performed chair to chair transfers ?  ?  ?Balance Overall balance assessment: Needs assistance ?Sitting-balance support: Feet supported, No upper extremity supported ?Sitting balance-Leahy Scale: Good ?  ?  ?Standing balance support: During functional activity, Reliant on assistive device for balance ?Standing balance-Leahy Scale: Poor ?Standing balance comment: tolerated 4 minutes of standing with min guard assist ?  ?  ?  ?  ?  ?  ?  ?  ?  ?  ?  ?   ? ?ADL either performed or assessed with clinical judgement  ? ?ADL Overall ADL's : Needs assistance/impaired ?  ?  ?Grooming: Wash/dry hands;Wash/dry face;Supervision/safety;Standing ?Grooming Details (indicate cue type and reason): stood at sink ?  ?  ?  ?  ?  ?  ?  ?  ?Toilet Transfer: Min guard;Ambulation;Rolling walker (2 wheels) ?Toilet Transfer Details (indicate cue type and reason): simulated with chair to chair transfer ?  ?  ?  ?  ?  ?General ADL Comments: good activity tolerance with patient tolerating 4 minutes of standing ?  ? ?Extremity/Trunk Assessment   ?  ?  ?  ?  ?  ? ?Vision   ?  ?  ?Perception   ?  ?Praxis   ?  ? ?Cognition Arousal/Alertness: Awake/alert ?Behavior During Therapy: [facility] for tasks assessed/performed ?Overall Cognitive Status: Within Functional Limits for tasks assessed ?  ?  ?  ?  ?  ?  ?  ?  ?  ?  ?  ?  ?  ?  ?  ?  ?  General Comments: alert and oriented ?  ?  ?   ?Exercises   ? ?  ?Shoulder Instructions   ? ? ?  ?General Comments    ? ? ?Pertinent Vitals/ Pain       Pain Assessment ?Pain Assessment: 0-10 ?Pain Score: 3  ?Faces Pain Scale: Hurts a little bit ?Pain Location: left side ?Pain Descriptors / Indicators: Discomfort, Grimacing ?Pain Intervention(s): Monitored during session ? ?Home  Living   ?  ?  ?  ?  ?  ?  ?  ?  ?  ?  ?  ?  ?  ?  ?  ?  ?  ?  ? ?  ?Prior Functioning/Environment    ?  ?  ?  ?   ? ?Frequency ? Min 2X/week  ? ? ? ? ?  ?Progress Toward Goals ? ?OT Goals(current goals can now be found in the care plan section) ? Progress towards OT goals: Progressing toward goals ? ?Acute Rehab OT Goals ?Patient Stated Goal: get better ?OT Goal Formulation: With patient ?Time For Goal Achievement: 07/05/21 ?Potential to Achieve Goals: Fair ?ADL Goals ?Pt Will Perform Lower Body Bathing: with adaptive equipment;sitting/lateral leans;sit to/from stand;with set-up ?Pt Will Perform Lower Body Dressing: with set-up;with adaptive equipment;sitting/lateral leans;sit to/from stand ?Pt Will Transfer to Toilet: with supervision;ambulating ?Additional ADL Goal #1: Patient will demonstrate increased activity tolerance by completing functional standing task for 3-5 with one seated rest break.  ?Plan Discharge plan remains appropriate   ? ?Co-evaluation ? ? ?   ?  ?  ?  ?  ? ?  ?AM-PAC OT "6 Clicks" Daily Activity     ?Outcome Measure ? ? Help from another person eating meals?: A Little ?Help from another person taking care of personal grooming?: A Little ?Help from another person toileting, which includes using toliet, bedpan, or urinal?: A Little ?Help from another person bathing (including washing, rinsing, drying)?: A Little ?Help from another person to put on and taking off regular upper body clothing?: A Little ?Help from another person to put on and taking off regular lower body clothing?: A Little ?6 Click Score: 18 ? ?  ?End of Session Equipment Utilized During Treatment: Rolling walker (2 wheels) ? ?OT Visit Diagnosis: Unsteadiness on feet (R26.81);Other abnormalities of gait and mobility (R26.89);Muscle weakness (generalized) (M62.81) ?  ?Activity Tolerance Patient tolerated treatment well ?  ?Patient Left in chair;with call bell/phone within reach ?  ?Nurse Communication Mobility status;Other  (comment) (complaints of left side pain) ?  ? ?   ? ?Time: 0093-8182 ?OT Time Calculation (min): 18 min ? ?Charges: OT General Charges ?$OT Visit: 1 Visit ?OT Treatments ?$Self Care/Home Management : 8-22 mins ? ?Lodema Hong, OTA ?Acute Rehabilitation Services  ?Pager [phone] ?Office [phone] ? ? ?[place] [facility] ?06/24/2021, 11:02 AM ?

## 2021-06-24 NOTE — TOC Benefit Eligibility Note (Signed)
Patient Advocate Encounter  Insurance verification completed.    The patient is currently admitted and upon discharge could be taking Brilinta 90 mg.  The current 30 day co-pay is, $40.00.   The patient is insured through [employer] Part D     Adrinne Sze, CPhT Pharmacy Patient Advocate Specialist  Pharmacy Patient Advocate Team Direct Number: [phone]  Fax: [phone]        

## 2021-06-24 NOTE — Progress Notes (Signed)
Heart Failure Nurse Navigator Progress Note ? ?PCP: [name], FNP ?PCP-Cardiologist:  ?Admission Diagnosis: SOB ?Admitted from: Home ? ?Presentation:   ?Zachary [name] presented on 06/18/21 with SOB started 6 months ago, worsened with exacerbation.Complaints of BLE edema and cough. Hx of type 2 diabetes, hypertension, hyperlipidemia,PVD, MSSA bacteremia. During admission  ?S/p Rt thoracentesis 3/12 ?S/p cardiac MRI 3/13 ?S/p cardia catheterization 3/14 ?S/p Lt thoracentesis 3/15 ? ?CVTS consulted: Not a candidate for CABG due to poor targets.  ? ? ?ECHO/ LVEF: 25-30% (06/19/21) ? ?Clinical Course: ? ?Past Medical History:  ?Diagnosis Date  ? AKI (acute kidney injury) ([place]) 06/18/2021  ? Diabetes mellitus   ? Hypercholesteremia   ? Hypertension   ? Patella fracture   ? left  ? PVD (peripheral vascular disease) ([place])   ?  ? ?Social History  ? ?Socioeconomic History  ? Marital status: Married  ?  Spouse name: Julie Paolini  ? Number of children: 2  ? Years of education: Not on file  ? Highest education level: 12th grade  ?Occupational History  ? Occupation: Retired  ?  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  ?Tobacco Use  ? Smoking status: Never  ?  Passive exposure: Past (25 years ago quit, smoked for 25 years.)  ? Smokeless tobacco: Never  ?Vaping Use  ? Vaping Use: Never used  ?Substance and Sexual Activity  ? Alcohol use: No  ? Drug use: No  ? Sexual activity: Not on file  ?Other Topics Concern  ? Not on file  ?Social History Narrative  ? Not on file  ? ?Social Determinants of Health  ? ?Financial Resource Strain: Low Risk   ? Difficulty of Paying Living Expenses: Not hard at all  ?Food Insecurity: No Food Insecurity  ? Worried About Charity fundraiser in the Last Year: Never true  ? Ran Out of Food in the Last Year: Never true  ?Transportation Needs: No Transportation Needs  ? Lack of Transportation (Medical): No  ? Lack of Transportation (Non-Medical): No  ?Physical Activity: Not on file  ?Stress: Not on file   ?Social Connections: Not on file  ? ? ?High Risk Criteria for Readmission and/or Poor Patient Outcomes: ?Heart failure hospital admissions (last 6 months): 1  ?No Show rate: 1 % ?Difficult social situation: No ?Demonstrates medication adherence: Yes, wife helps ?Primary Language:  ?English ?Literacy level: No issues with reading/writing ? ?Barriers of Care:   ?Low EF ? ?Considerations/Referrals:  ? ?Referral made to Heart Failure Pharmacist Stewardship: yes, appreciate for med management ?Referral made to Heart Failure CSW/NCM TOC:  ?Referral made to Heart & Vascular TOC clinic: yes ? ?Items for Follow-up on DC/TOC: ?Optimize ?Medication management ? ? ?Earnestine Leys, BSN, RN ?Heart Failure Nurse Navigator ?[phone]   ?

## 2021-06-24 NOTE — Progress Notes (Signed)
Subjective:  ?Breathing improving ? ?S/p Rt thoracentesis 3/12 ?S/p cardiac MRI 3/13 ?S/p cardiac catheterization 3/14 ?S/p Lt thoracentesis 3/15 ? ?Objective:  ?Vital Signs in the last 24 hours: ?Temp:  [97.3 ?F (36.3 ?C)-98.1 ?F (36.7 ?C)] 97.6 ?F (36.4 ?C) (03/16 1221) ?Pulse Rate:  [76-81] 76 (03/16 1221) ?Resp:  [16-20] 19 (03/16 1221) ?BP: (101-122)/(57-63) 109/63 (03/16 1221) ?SpO2:  [96 %-100 %] 98 % (03/16 1221) ?Weight:  [81.9 kg] 81.9 kg (03/16 0555) ? ?Intake/Output from previous day: ?03/15 0701 - 03/16 0700 ?In: 65 [P.O.:660] ?Out: 1875 [[id number]] ? ?Physical Exam ?Vitals and nursing note reviewed.  ?Constitutional:   ?   General: He is not in acute distress. ?Neck:  ?   Vascular: JVD present.  ?Cardiovascular:  ?   Rate and Rhythm: Normal rate and regular rhythm.  ?   Heart sounds: Normal heart sounds. No murmur heard. ?Pulmonary:  ?   Effort: Pulmonary effort is normal.  ?   Breath sounds: Examination of the right-lower field reveals decreased breath sounds. Examination of the left-lower field reveals decreased breath sounds. Decreased breath sounds present. No wheezing or rales.  ?Musculoskeletal:  ?   Right lower leg: Edema (1+) present.  ?   Left lower leg: Edema (1+) present.  ? ? ?Cardiac Studies: ? ?Reviewed and interpreted: ?BMP, BNP, trop HS ?  ?Imaging/tests reviewed and independently interpreted: ?  ?Cardiac Studies: ?  ?Telemetry 06/20/2021: ?No significant arrhythmia ?  ?EKG 06/19/2021: ?Sinus tachycardia ?Anterior infarct, old ?  ?Echocardiogram 06/19/2021: ? 1. Left ventricular ejection fraction, by estimation, is 25 to 30%. The  ?left ventricle has severely decreased function. The left ventricle  ?demonstrates regional wall motion abnormalities (see scoring  ?diagram/findings for description). Left ventricular  ?diastolic function could not be evaluated. There is severe hypokinesis of  ?the left ventricular, mid-apical anteroseptal wall, anterior wall and  ?apical segment.  ? 2. Right  ventricular systolic function is mildly reduced. The right  ?ventricular size is normal. There is normal pulmonary artery systolic  ?pressure.  ? 3. Left atrial size was mildly dilated.  ? 4. Right atrial size was mildly dilated.  ? 5. The mitral valve is abnormal. Moderate mitral valve regurgitation.  ?Moderate mitral stenosis. Moderate mitral annular calcification. Mean PG 9  ?mmHg, MVA 1.34 cm2 by PHT method, likely more prominent due to resting  ?tachycardia around 104 bpm.  ? 6. Tricuspid valve regurgitation is mild to moderate.  ? 7. The aortic valve is tricuspid. There is moderate calcification of the  ?aortic valve. Aortic valve regurgitation is moderate. Aortic valve area,  ?by VTI measures 0.87 cm?Marland Kitchen Aortic valve mean gradient measures 22.0 mmHg.  ?Aortic valve Vmax measures 3.14  ?m/s. Dimensionless index 0.29.  ? 8. Moderate pleural effusion.  ? 9. Compared to previous transthoracic and transesophageal studies in  ?04/2021, LVEF is reduced from 40-45%. Regional wall motion abnormalities  ?(LAD territory) are more pronounced Mild RV dysfunctinon is new. Valvular  ?findings are relatively unchanged.  ?  ?Echocardiogram 04/26/2021: ?1. Left ventricular ejection fraction, by estimation, is 40 to 45%. The  ?left ventricle has mildly decreased function. The left ventricle  ?demonstrates regional wall motion abnormalities (see scoring  ?diagram/findings for description). Left ventricular  ?diastolic parameters are indeterminate.  ? 2. Right ventricular systolic function is normal. The right ventricular  ?size is normal. Tricuspid regurgitation signal is inadequate for assessing  ?PA pressure.  ? 3. Left atrial size was mildly dilated.  ? 4. Right atrial size  was mildly dilated.  ? 5. The mitral valve is degenerative. No evidence of mitral valve  ?regurgitation. Mild to moderate mitral stenosis. The mean mitral valve  ?gradient is 6.0 mmHg with average heart rate of 88 bpm. Moderate to severe  ?mitral annular  calcification.  ? 6. The aortic valve is tricuspid. Aortic valve regurgitation is not  ?visualized. Moderate to severe aortic valve stenosis. Aortic valve mean  ?gradient measures 28.0 mmHg. Aortic valve Vmax measures 3.82 m/s.  ? ?Conclusion(s)/Recommendation(s): No hemodynamically significant valvular  ?heart disease.  ?  ?Assessment & Recommendations: ?  ?76 y.o. Caucasian male  with hypertension, uncontrolled type 2 diabetes mellitus, peripheral artery disease, h/o osteomyelitis s/p right foot toe amputation, nonhealing wound left foot toe, h/o epidural abscess and MSSA bacteremia 04/2021, critical limb ischemia, moderate mitral and aortic stenosis, now admitted with congestive heart failure. ?  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  Not a candidate for CABG due to poor targets.  ?See below regarding revascularization. ?Cr increased to 1.88. Net negative 5.6 L, also had large volume thoracenteses. ?Hold Lasix. ?Hold bisoprolol 2.5 mg, given low normal blood pressures. ?Continue BiDil half tablet 3 times daily for afterload reduction. ?Unable to use ARN I/ARB/MRA/SGLT2 i due to renal dysfunction. ?May consider adding in the future ?Continue strict I/O. ? ?CAD: ?Severe multivessel coronary artery disease. ?Not a surgical candidate due to poor targets. ?Discussed high risk PCI options with the patient. ?RCA and left circumflex are codominant.  RCA is likely underfilled due to ostial-proximal, and mid RCA calcified severe stenoses.  Left circumflex has a focal stenosis.  LAD is diffusely diseased. ?We will attempt staged PCI, starting with RCA plus minus left circumflex, followed by consideration for LAD revascularization.  Given multivessel  disease, including RCA and circumflex, will use temporary pacemaker for atherectomy to RCA.  ?Contemplated hemodynamic support.  However, given his moderate AS, peripheral artery disease, this would not be without risk of complications.  We will keep this as a bailout strategy through femoral approach. ? ?Benefits of revascularization would include improvement in coronary perfusion, and hopefully improvement in heart failure, when used in conjunction with medical therapy. ?Risks include bleeding, infection, stroke, coronary dissection, perforation, life-threatening arrhythmias, contrast-induced nephropathy including need for dialysis, stroke, death.  Risks of transvenous pacer support, also ventricular perforation, rupture. ?Risks of all of these are around 3-4%. ?Alternate option would be medical therapy alone, but prognosis remains poor. ? ?I explained to the patient and wife that this will not significantly affect his systemic disease of atherosclerosis, which needs aggressive medical management regardless.  He also needs aggressive management of his diabetes mellitus. ? ?Patient and wife understand the risks benefits, alternate options, and would like to proceed. ? ?Provided improvement in creatinine tomorrow morning, will plan for PCI tomorrow afternoon. ?Loaded with Brilinta 180 mg this afternoon, followed by 90 mg twice daily. ?Continue aspirin, statin.  Continue to hold Plavix pending decision on surgical revascularization. ?Beta-blocker on hold while optimizing heart failure management. ?We will start in the near future. ? ?Hyponatremia: ?Likely dilutional in the setting of decompensated heart failure. ?Continue heart failure management. ?  ?PAD: ?Absent pedal pulses on exam. ?Follows up with Dr. [name] with vascular interventional radiology. ?Plan for repeat angiography per Dr. [name] prior  to anticipated amputation of left foot toes ?This may have to wait until after management of acute cardiac issues at  this time. ?   ?Discussed with primary team. ? ? ? ?Nigel Mormon, MD ?Pager: [phone] ?Office: [phone] ? ?

## 2021-06-24 NOTE — Progress Notes (Signed)
?PROGRESS NOTE ? ? ? ?Zachary [name]  ZOX:[id number] DOB: [birth year] DOA: 06/18/2021 ?PCP: [name], FNP  ?Narrative 75/F with history of uncontrolled type 2 diabetes mellitus, hypertension, dyslipidemia, peripheral vascular disease, recent complicated hospitalization hospitalization with osteomyelitis, MSSA bacteremia and epidural abscess, status post right great toe amputation 1/10 ,TEE negative for endocarditis, completed prolonged course of IV cefazolin and oral ciprofloxacin on 3/6. ?-Presented to the ED with progressive dyspnea on exertion ?-Diagnosed with acute systolic CHF, EF down to 81-19%, diuresed with IV Lasix ?-Cardiology following, underwent left and right heart cath 3/14, noted to have severe multivessel CAD, T CTS consulted, Plavix discontinued ? ? ?Subjective: ?-Feels okay overall, denies any dyspnea ? ?Assessment and Plan: ? ?* Acute systolic CHF (congestive heart failure) ([place]) ?Acute hypoxic respiratory failure ?Pleural effusions ?-ECHO showed an EF of 25 to 30%  ?-Diuresed with IV Lasix he is 3.2 L negative ?-s/p R Thoracentesis  3/12-> 2.4 Liters of Amber Fluid, fluid transudative, cultures negative ?-L Thora 3/15: 1.2L drained ?-Cardiac MRI-"Severely Reduced LV function with regional wall motion abnormalities. Apex does not appear viable, other LV segments appear viable. ?-LHC 3/14 with severe multivessel CAD ?-Diuresed with IV Lasix he is 4.5 L negative ?-Continue bisoprolol, Corlanor, Imdur ?-GDMT as tolerated, creatinine trending up, will hold diuretics ?-Wean down O2, increase activity, PT eval ? ?Multivessel CAD ?-Continue aspirin, bisoprolol, statin ?-Appreciate TCTS consult, not a candidate for CABG ?-Cardiology plans high risk PCI in 2 steps, step 1 tomorrow ? ?AKI (acute kidney injury) ([place]), CKD 3a ?-Baseline creatinine around 1.3-1.4, now in the 1.6-1.7 range ?-Likely hemodynamically mediated, holding diuretics today ? ?History of epidural abscess, osteomyelitis of great  toe and left fourth distal phalanx ?-Followed by podiatry, s/p amputation on 1/10.  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  plans for transmetatarsal amputation of the left foot in April with Podiatry. ?-Had MSSA bacteremia with epidural abscess during recent hospitalization in January, TEE was negative for endocarditis ?-Seen by infectious disease, initially treated with cefepime followed by cefazolin and oral ciprofloxacin, completed antibiotic course 3/6 ? ?Peripheral vascular disease ([place]) ?-Continued Aspirin but held Plavix ?-Cards consulted and he has absent pedal pulses and follows with Dr. [name] of Podiatry and plan is for Surgical Intervention on the 4th Toe in April with a Transmetatarsal Amputation of the Left Foot in April   ?-Discussed with Dr. [name], plan for follow-up in few weeks in IR clinic and evaluate for angiogram  ? ?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 (HCC) ?-CBG stable, continue sliding scale insulin, add meal coverage ? ?Essential hypertension ?-Continue bisoprolol ? ?DVT prophylaxis: Heparin subcutaneous ?Code Status: Full code ?Family Communication: Discussed with patient in detail, no family at bedside ?Disposition Plan: likely 3-4days ? ?Consultants:  ?Cardiology, T CTS ? ?Procedures:  ? ?Antimicrobials:  ? ? ?Objective: ?Vitals:  ? 06/23/21 2327 06/24/21 0555 06/24/21 0721 06/24/21 1221  ?BP: (!) 101/57 (!) 117/59 (!) 122/58 109/63  ?Pulse: 79 80 81 76  ?Resp: '16 20 17 19  '$ ?Temp: 97.7 ?F (36.5 ?C) (!) 97.3 ?F (36.3 ?C) (!) 97.5 ?F (36.4 ?C) 97.6 ?F (36.4 ?C)  ?TempSrc: Oral Oral Oral Oral  ?SpO2: 100% 98% 96% 98%  ?Weight:  81.9 kg    ?Height:      ? ? ?Intake/Output Summary (Last 24 hours) at 06/24/2021 1313 ?Last data filed at 06/24/2021 1224 ?Gross per 24 hour  ?Intake 720 ml  ?Output 3050 ml  ?Net -2330 ml  ? ?Filed  Weights  ? 06/22/21 0335 06/23/21 0520 06/24/21 0555  ?Weight: 80.8 kg 80.6 kg 81.9 kg  ? ? ?Examination: ? ?General exam:  Pleasant male sitting up in bed, AAOx3, no distress ?HEENT: No JVD ?CVS: S1-S2, regular rhythm ?Lungs: Decreased breath sounds at the bases ?Abdomen: Soft, nontender, bowel sounds present ?Extremities: Right great toe wound, healing well, status post amputation with dressing ?Skin: As above ?Psychiatry: Judgement and insight appear normal. Mood & affect appropriate.  ? ? ? ?Data Reviewed:  ? ?CBC: ?Recent Labs  ?Lab 06/19/21 ?0125 06/20/21 ?0310 06/21/21 ?0307 06/22/21 ?0157 06/22/21 ?1962 06/22/21 ?2297 06/23/21 ?0249 06/24/21 ?0249  ?WBC 8.8 8.2 8.0 8.6  --   --  9.7 8.0  ?NEUTROABS 6.7 6.5 6.5 6.8  --   --  7.6  --   ?HGB 11.7* 13.5 13.2 13.9 12.2*  12.6* 12.2* 12.2* 11.3*  ?HCT 36.3* 40.4 38.7* 42.0 36.0*  37.0* 36.0* 37.7* 34.6*  ?MCV 88.1 86.5 85.4 86.6  --   --  86.1 85.6  ?PLT 184 200 191 221  --   --  230 210  ? ?Basic Metabolic Panel: ?Recent Labs  ?Lab 06/20/21 ?0310 06/21/21 ?0307 06/22/21 ?0157 06/22/21 ?9892 06/22/21 ?1194 06/23/21 ?0249 06/24/21 ?0249  ?NA 130* 132* 134* 133*  134* 136 134* 132*  ?K 3.9 3.9 4.0 3.8  3.8 3.8 4.1 3.8  ?CL 88* 89* 90*  --   --  90* 91*  ?CO2 34* 33* 33*  --   --  32 30  ?GLUCOSE 196* 169* 187*  --   --  182* 177*  ?BUN 26* 26* 29*  --   --  32* 35*  ?CREATININE 1.73* 1.62* 1.76*  --   --  1.63* 1.88*  ?CALCIUM 8.6* 8.4* 9.0  --   --  8.6* 8.2*  ?MG 1.8 2.1 2.1  --   --  2.0  --   ?PHOS 4.4 4.1 4.3  --   --  4.4  --   ? ?GFR: ?Estimated Creatinine Clearance: 31.8 mL/min (A) (by C-G formula based on SCr of 1.88 mg/dL (H)). ?Liver Function Tests: ?Recent Labs  ?Lab 06/20/21 ?0310 06/21/21 ?1740 06/22/21 ?0157 06/23/21 ?0249 06/24/21 ?0249  ?AST 18 13* 16 15 13*  ?ALT '9 8 9 10 8  '$ ?ALKPHOS 55 50 53 50 44  ?BILITOT 0.6 0.7 0.8 0.6 0.7  ?PROT 6.3* 5.6* 6.2* 6.1* 5.7*  ?ALBUMIN 2.9* 2.6* 2.8* 2.8* 2.6*  ? ?No results for input(s): LIPASE, AMYLASE in the last 168 hours. ?No results for input(s): AMMONIA in the last 168 hours. ?Coagulation Profile: ?No results for input(s):  INR, PROTIME in the last 168 hours. ?Cardiac Enzymes: ?No results for input(s): CKTOTAL, CKMB, CKMBINDEX, TROPONINI in the last 168 hours. ?BNP (last 3 results) ?No results for input(s): PROBNP in the last 8760 hours. ?HbA1C: ?No results for input(s): HGBA1C in the last 72 hours. ?CBG: ?Recent Labs  ?Lab 06/23/21 ?1138 06/23/21 ?1622 06/23/21 ?2050 06/24/21 ?8144 06/24/21 ?1222  ?GLUCAP 211* 288* 261* 174* 245*  ? ?Lipid Profile: ?No results for input(s): CHOL, HDL, LDLCALC, TRIG, CHOLHDL, LDLDIRECT in the last 72 hours. ?Thyroid Function Tests: ?No results for input(s): TSH, T4TOTAL, FREET4, T3FREE, THYROIDAB in the last 72 hours. ?Anemia Panel: ?No results for input(s): VITAMINB12, FOLATE, FERRITIN, TIBC, IRON, RETICCTPCT in the last 72 hours. ?Urine analysis: ?   ?Component Value Date/Time  ? COLORURINE YELLOW 04/18/2021 1635  ? APPEARANCEUR CLOUDY (A) 04/18/2021 1635  ? LABSPEC 1.025 04/18/2021 1635  ?  PHURINE 5.5 04/18/2021 1635  ? GLUCOSEU >=500 (A) 04/18/2021 1635  ? HGBUR LARGE (A) 04/18/2021 1635  ? BILIRUBINUR NEGATIVE 04/18/2021 1635  ? [place] NEGATIVE 04/18/2021 1635  ? PROTEINUR 100 (A) 04/18/2021 1635  ? NITRITE NEGATIVE 04/18/2021 1635  ? LEUKOCYTESUR SMALL (A) 04/18/2021 1635  ? ?Sepsis Labs: ?'@LABRCNTIP'$ (procalcitonin:4,lacticidven:4) ? ?) ?Recent Results (from the past 240 hour(s))  ?Resp Panel by RT-PCR (Flu A&B, Covid) Nasopharyngeal Swab     Status: None  ? Collection Time: 06/18/21  3:17 PM  ? Specimen: Nasopharyngeal Swab; Nasopharyngeal(NP) swabs in vial transport medium  ?Result Value Ref Range Status  ? SARS Coronavirus 2 by RT PCR NEGATIVE NEGATIVE Final  ?  Comment: (NOTE) ?SARS-CoV-2 target nucleic acids are NOT DETECTED. ? ?The SARS-CoV-2 RNA is generally detectable in upper respiratory ?specimens during the acute phase of infection. The lowest ?concentration of SARS-CoV-2 viral copies this assay can detect is ?138 copies/mL. A negative result does not preclude SARS-Cov-2 ?infection  and should not be used as the sole basis for treatment or ?other patient management decisions. A negative result may occur with  ?improper specimen collection/handling, submission of specimen other ?than nasophary

## 2021-06-24 NOTE — Care Management Important Message (Signed)
Important Message ? ?Patient Details  ?Name: Zachary [name] ?MRN: [id number] ?Date of Birth: [birth year] ? ? ?Medicare Important Message Given:  Yes ? ? ? ? ?[name] ?06/24/2021, 8:49 AM ?

## 2021-06-24 NOTE — Progress Notes (Addendum)
Overnight event ? ?Notified by RN that patient is complaining of left-sided pressure-like chest pain and his vital signs are stable, not hypoxic.  Chart reviewed, patient with severe multivessel CAD per [facility] 3/14 and not a candidate for CABG.  Cardiology planning on doing high risk PCI tomorrow.  Patient underwent left-sided thoracentesis yesterday. ? ?Stat EKG done and showing mild ST elevations in the anterior leads. ? ?-Stat high-sensitivity troponin x2 ?-Stat chest x-ray ?-Sublingual nitroglycerin as needed ? ?Update: ?Chest x-ray showing cardiomegaly with distended pulmonary vasculature.  Interstitial and airspace opacities at the lung bases, possible edema or infiltrate.  Hazy opacities at the lung bases, possible small pleural effusions. ?-Pneumonia less likely given no fever or leukocytosis.  Procalcitonin <0.10.  ?-BNP was 2098 on 3/10.  Echo 3/11 showing EF 25 to 30%.  Patient had pleural effusions on initial imaging and underwent right-sided thoracentesis on 3/12 with 2.4 L fluid removed and left-sided thoracentesis on 3/15 with 1.2 L removed.  He was receiving IV Lasix but it was held due to worsening renal function.  Creatinine 1.8 today, baseline around 1.3-1.4.  Repeat labs in the morning to check renal function and resume Lasix if renal function improves. ?-Informed by RN that chest pain resolved after patient was given Tylenol, he did not require sublingual nitroglycerin ?-ACS less likely as high-sensitivity troponin 41, no significant change compared to labs done 6 days ago.  Second set of troponin pending. ? ?Addendum/update: Second troponin stable at 34. ? ?

## 2021-06-25 ENCOUNTER — Telehealth: Payer: Self-pay

## 2021-06-25 ENCOUNTER — Telehealth: Payer: Self-pay | Admitting: Urology

## 2021-06-25 ENCOUNTER — Inpatient Hospital Stay (HOSPITAL_COMMUNITY)
Admission: EM | Disposition: A | Discharge status: Home Health | Payer: Self-pay | Source: Home / Self Care | Attending: Internal Medicine

## 2021-06-25 DIAGNOSIS — I5021 Acute systolic (congestive) heart failure: Secondary | ICD-10-CM | POA: Diagnosis not present

## 2021-06-25 HISTORY — PX: LEFT HEART CATH: CATH118248

## 2021-06-25 HISTORY — PX: CORONARY STENT INTERVENTION: CATH118234

## 2021-06-25 HISTORY — PX: CORONARY ATHERECTOMY: CATH118238

## 2021-06-25 LAB — BASIC METABOLIC PANEL
Anion gap: 10 (ref 5–15)
Anion gap: 11 (ref 5–15)
BUN: 34 mg/dL — ABNORMAL HIGH (ref 8–23)
BUN: 31 mg/dL — ABNORMAL HIGH (ref 8–23)
CO2: 31 mmol/L (ref 22–32)
CO2: 29 mmol/L (ref 22–32)
Calcium: 8.2 mg/dL — ABNORMAL LOW (ref 8.9–10.3)
Calcium: 8.3 mg/dL — ABNORMAL LOW (ref 8.9–10.3)
Chloride: 88 mmol/L — ABNORMAL LOW (ref 98–111)
Chloride: 90 mmol/L — ABNORMAL LOW (ref 98–111)
Creatinine, Ser: 1.82 mg/dL — ABNORMAL HIGH (ref 0.61–1.24)
Creatinine, Ser: 1.78 mg/dL — ABNORMAL HIGH (ref 0.61–1.24)
GFR, Estimated: 38 mL/min — ABNORMAL LOW (ref >60)
GFR, Estimated: 39 mL/min — ABNORMAL LOW (ref >60)
Glucose, Bld: 206 mg/dL — ABNORMAL HIGH (ref 70–99)
Glucose, Bld: 201 mg/dL — ABNORMAL HIGH (ref 70–99)
Potassium: 3.7 mmol/L (ref 3.5–5.1)
Potassium: 3.8 mmol/L (ref 3.5–5.1)
Sodium: 129 mmol/L — ABNORMAL LOW (ref 135–145)
Sodium: 130 mmol/L — ABNORMAL LOW (ref 135–145)

## 2021-06-25 LAB — POCT ACTIVATED CLOTTING TIME
Activated Clotting Time: 329 seconds
Activated Clotting Time: 311 seconds
Activated Clotting Time: 299 seconds
Activated Clotting Time: 227 seconds
Activated Clotting Time: 185 seconds

## 2021-06-25 LAB — GLUCOSE, CAPILLARY
Glucose-Capillary: 195 mg/dL — ABNORMAL HIGH (ref 70–99)
Glucose-Capillary: 191 mg/dL — ABNORMAL HIGH (ref 70–99)
Glucose-Capillary: 193 mg/dL — ABNORMAL HIGH (ref 70–99)
Glucose-Capillary: 254 mg/dL — ABNORMAL HIGH (ref 70–99)

## 2021-06-25 LAB — PATHOLOGIST SMEAR REVIEW: Path Review: NEGATIVE

## 2021-06-25 LAB — TROPONIN I (HIGH SENSITIVITY): Troponin I (High Sensitivity): 34 ng/L — ABNORMAL HIGH (ref <18)

## 2021-06-25 SURGERY — CORONARY STENT INTERVENTION
Anesthesia: LOCAL

## 2021-06-25 MED ORDER — MIDAZOLAM HCL 2 MG/2ML IJ SOLN
INTRAMUSCULAR | Status: DC | PRN
  Administered 2021-06-25 (×2): .5 mg via INTRAVENOUS

## 2021-06-25 MED ORDER — FUROSEMIDE 10 MG/ML IJ SOLN
INTRAMUSCULAR | Status: AC
  Filled 2021-06-25: qty 4

## 2021-06-25 MED ORDER — ONDANSETRON HCL 4 MG/2ML IJ SOLN: 4.0000 mg | Freq: Four times a day (QID) | INTRAMUSCULAR | Status: DC | PRN

## 2021-06-25 MED ORDER — LIDOCAINE HCL (PF) 1 % IJ SOLN
INTRAMUSCULAR | Status: AC
  Filled 2021-06-25: qty 30

## 2021-06-25 MED ORDER — VIPERSLIDE LUBRICANT OPTIME: TOPICAL | Status: DC | PRN

## 2021-06-25 MED ORDER — ACETAMINOPHEN 325 MG PO TABS: 650.0000 mg | ORAL_TABLET | ORAL | Status: DC | PRN

## 2021-06-25 MED ORDER — HEPARIN (PORCINE) IN NACL 1000-0.9 UT/500ML-% IV SOLN
INTRAVENOUS | Status: DC | PRN
  Administered 2021-06-25 (×2): 500 mL

## 2021-06-25 MED ORDER — FUROSEMIDE 10 MG/ML IJ SOLN
INTRAMUSCULAR | Status: DC | PRN
  Administered 2021-06-25: 40 mg via INTRAVENOUS

## 2021-06-25 MED ORDER — FENTANYL CITRATE (PF) 100 MCG/2ML IJ SOLN
INTRAMUSCULAR | Status: DC | PRN
  Administered 2021-06-25: 25 ug via INTRAVENOUS
  Administered 2021-06-25 (×2): 12.5 ug via INTRAVENOUS

## 2021-06-25 MED ORDER — HEPARIN SODIUM (PORCINE) 1000 UNIT/ML IJ SOLN
INTRAMUSCULAR | Status: DC | PRN
  Administered 2021-06-25 (×2): 3000 [IU] via INTRAVENOUS
  Administered 2021-06-25: 8000 [IU] via INTRAVENOUS

## 2021-06-25 MED ORDER — LABETALOL HCL 5 MG/ML IV SOLN: 10.0000 mg | INTRAVENOUS | Status: AC | PRN

## 2021-06-25 MED ORDER — NOREPINEPHRINE 4 MG/250ML-% IV SOLN
INTRAVENOUS | Status: AC
  Filled 2021-06-25: qty 250

## 2021-06-25 MED ORDER — FENTANYL CITRATE (PF) 100 MCG/2ML IJ SOLN
INTRAMUSCULAR | Status: AC
Start: 2021-06-25 — End: ?
  Filled 2021-06-25: qty 2

## 2021-06-25 MED ORDER — HEPARIN SODIUM (PORCINE) 1000 UNIT/ML IJ SOLN
INTRAMUSCULAR | Status: AC
Start: 2021-06-25 — End: ?
  Filled 2021-06-25: qty 10

## 2021-06-25 MED ORDER — NITROGLYCERIN 1 MG/10 ML FOR IR/CATH LAB
INTRA_ARTERIAL | Status: AC
Start: 2021-06-25 — End: ?
  Filled 2021-06-25: qty 10

## 2021-06-25 MED ORDER — SODIUM CHLORIDE 0.9 % IV SOLN: 250.0000 mL | INTRAVENOUS | Status: DC | PRN

## 2021-06-25 MED ORDER — SODIUM CHLORIDE 0.9% FLUSH
3.0000 mL | INTRAVENOUS | Status: DC | PRN
  Administered 2021-06-25: 3 mL via INTRAVENOUS

## 2021-06-25 MED ORDER — SODIUM CHLORIDE 0.9% FLUSH
3.0000 mL | Freq: Two times a day (BID) | INTRAVENOUS | Status: DC
  Administered 2021-06-26 – 2021-06-29 (×7): 3 mL via INTRAVENOUS

## 2021-06-25 MED ORDER — HYDRALAZINE HCL 20 MG/ML IJ SOLN: 10.0000 mg | INTRAMUSCULAR | Status: AC | PRN

## 2021-06-25 MED ORDER — NITROGLYCERIN 1 MG/10 ML FOR IR/CATH LAB
INTRA_ARTERIAL | Status: DC | PRN
  Administered 2021-06-25 (×2): 100 ug via INTRACORONARY

## 2021-06-25 MED ORDER — FENTANYL CITRATE (PF) 100 MCG/2ML IJ SOLN
INTRAMUSCULAR | Status: AC
  Filled 2021-06-25: qty 2

## 2021-06-25 MED ORDER — HEPARIN (PORCINE) IN NACL 1000-0.9 UT/500ML-% IV SOLN
INTRAVENOUS | Status: AC
  Filled 2021-06-25: qty 1000

## 2021-06-25 MED ORDER — IOHEXOL 350 MG/ML SOLN
INTRAVENOUS | Status: DC | PRN
Start: 2021-06-25 — End: 2021-06-25
  Administered 2021-06-25: 80 mL

## 2021-06-25 MED ORDER — FUROSEMIDE 10 MG/ML IJ SOLN
40.0000 mg | Freq: Every day | INTRAMUSCULAR | Status: DC
  Administered 2021-06-26: 40 mg via INTRAVENOUS
  Filled 2021-06-25: qty 4

## 2021-06-25 MED ORDER — MIDAZOLAM HCL 2 MG/2ML IJ SOLN
INTRAMUSCULAR | Status: AC
  Filled 2021-06-25: qty 2

## 2021-06-25 SURGICAL SUPPLY — 39 items
BALLN SAPPHIRE 1.5X15 (BALLOONS) ×2
BALLN SAPPHIRE 2.5X15 (BALLOONS) ×2
BALLN SAPPHIRE 3.5X15 (BALLOONS) ×2
BALLN SAPPHIRE ~~LOC~~ 3.5X12 (BALLOONS) ×1 IMPLANT
BALLN SCOREFLEX 2.50X15 (BALLOONS) ×2
BALLOON SAPPHIRE 1.5X15 (BALLOONS) IMPLANT
BALLOON SAPPHIRE 2.5X15 (BALLOONS) IMPLANT
BALLOON SAPPHIRE 3.5X15 (BALLOONS) IMPLANT
BALLOON SCOREFLEX 2.50X15 (BALLOONS) IMPLANT
CABLE ADAPT PACING TEMP 12FT (ADAPTER) ×1 IMPLANT
CATH INFINITI JR4 5F (CATHETERS) ×1 IMPLANT
CATH LAUNCHER 6FR AL.75 (CATHETERS) ×1 IMPLANT
CATH OPTICROSS HD (CATHETERS) ×1 IMPLANT
CATH S G BIP PACING (CATHETERS) ×1 IMPLANT
CATH TELEPORT (CATHETERS) ×1 IMPLANT
CATH VISTA GUIDE 6FR 3DRC (CATHETERS) ×1 IMPLANT
CATH VISTA GUIDE 6FR JR4 (CATHETERS) ×1 IMPLANT
CROWN DIAMONDBACK CLASSIC 1.25 (BURR) ×1 IMPLANT
ELECT DEFIB PAD ADLT CADENCE (PAD) ×1 IMPLANT
KIT ENCORE 26 ADVANTAGE (KITS) ×1 IMPLANT
KIT HEART LEFT (KITS) ×4 IMPLANT
KIT HEMO VALVE WATCHDOG (MISCELLANEOUS) ×1 IMPLANT
KIT MICROPUNCTURE NIT STIFF (SHEATH) ×1 IMPLANT
LUBRICANT VIPERSLIDE CORONARY (MISCELLANEOUS) ×1 IMPLANT
MAT PREVALON FULL STRYKER (MISCELLANEOUS) ×1 IMPLANT
PACK CARDIAC CATHETERIZATION (CUSTOM PROCEDURE TRAY) ×4 IMPLANT
SHEATH PINNACLE 6F 10CM (SHEATH) ×2 IMPLANT
SLED PULL BACK IVUS (MISCELLANEOUS) ×1 IMPLANT
SLEEVE REPOSITIONING LENGTH 30 (MISCELLANEOUS) ×1 IMPLANT
STENT ONYX FRONTIER 3.5X18 (Permanent Stent) ×1 IMPLANT
SYR MEDRAD MARK 7 150ML (SYRINGE) ×2 IMPLANT
TRANSDUCER W/STOPCOCK (MISCELLANEOUS) ×4 IMPLANT
TUBING CIL FLEX 10 FLL-RA (TUBING) ×4 IMPLANT
WIRE ASAHI PROWATER 180CM (WIRE) ×1 IMPLANT
WIRE ASAHI PROWATER 300CM (WIRE) ×1 IMPLANT
WIRE COUGAR XT STRL 190CM (WIRE) ×1 IMPLANT
WIRE EMERALD 3MM-J .035X150CM (WIRE) ×1 IMPLANT
WIRE MICROINTRODUCER 60CM (WIRE) ×1 IMPLANT
WIRE VIPERWIRE COR FLEX .012 (WIRE) ×1 IMPLANT

## 2021-06-25 NOTE — Progress Notes (Signed)
50f sheath aspirated and removed from right femoral vein. Manual pressure applied for 5 minutes, then the 628fsheath in the right femoral artery was aspirated and removed. Manual pressure then applied over both sites for additional 20 minutes. Site level 0, no s+s of hematoma. Tegaderm dressing applied, bedrest instructions given. ? ? ?Left dp and pt pulses present with doppler. Right pt present with doppler. Right dp absent. ? ? ?Bedrest begins at 19:30:00 ?

## 2021-06-25 NOTE — Progress Notes (Signed)
PT Cancellation Note ? ?Patient Details ?Name: Zachary [name] ?MRN: [id number] ?DOB: 1945-07-26 ? ? ?Cancelled Treatment:    Reason Eval/Treat Not Completed: (P) Patient at procedure or test/unavailable (pt at Cath lab for procedure.) Will continue efforts per PT plan of care as schedule permits. ? ? ?Jakorey Mcconathy M Quayshaun Hubbert ?06/25/2021, 12:56 PM ? ? ?

## 2021-06-25 NOTE — Telephone Encounter (Signed)
Left voicemail asking patient to return my call.   Lanaiya Lantry P Seryna Marek, CMA  

## 2021-06-25 NOTE — Progress Notes (Signed)
?PROGRESS NOTE ? ? ? 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 ?-Presented to the ED with progressive dyspnea on exertion ?-Diagnosed with acute systolic CHF, EF down to 93-79%, diuresed with IV Lasix ?-Cardiology following, underwent left and right heart cath 3/14, noted to have severe multivessel CAD, T CTS consulted, Plavix discontinued ?-Not a candidate for CABG: No targets ?-Plan for high risk PCI x2 ? ? ?Subjective: ?-Feels well today, had some chest discomfort overnight, shortness of breath with minimal activity ? ?Assessment and Plan: ? ?* Acute systolic CHF (congestive heart failure) ([place]) ?Acute hypoxic respiratory failure ?Pleural effusions ?-ECHO showed an EF of 25 to 30%  ?-Diuresed with IV Lasix, he is 6.8 L negative ?-s/p R Thoracentesis  3/12-> 2.4 Liters of Amber Fluid, fluid transudative, cultures negative ?-L Thora 3/15: 1.2L drained ?-Cardiac MRI-"Severely Reduced LV function with regional wall motion abnormalities. Apex does not appear viable, other LV segments appear viable. ?-LHC 3/14 with severe multivessel CAD ?-Diuretics on hold now, creatinine is stable ?-Continue bisoprolol, Corlanor, Imdur ?-Plan for high risk PCI today ? ?Multivessel CAD ?-Continue aspirin, bisoprolol, statin ?-Appreciate TCTS consult, not a candidate for CABG ?-Cardiology plans high risk PCI in 2 steps, step 1 today ? ?AKI (acute kidney injury) ([place]), CKD 3a ?-Baseline creatinine around 1.3-1.4, now in the 1.6-1.7 range ?-Likely hemodynamically mediated, holding diuretics  today ? 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  plans for transmetatarsal amputation of the left foot in April with Podiatry. ?-Had MSSA bacteremia with epidural abscess during recent hospitalization in January, TEE was negative for endocarditis ?-Seen by infectious disease, initially treated with cefepime followed by cefazolin and oral ciprofloxacin, completed antibiotic course 3/6 ? ?Peripheral vascular disease ([place]) ?-Continued Aspirin but held Plavix ?-Cards consulted and he has absent pedal pulses and follows with Dr. [name] of Podiatry and plan is for Surgical Intervention on the 4th Toe in April with a Transmetatarsal Amputation of the Left Foot in April   ?-Discussed with Dr. [name] 3/16, plan for follow-up in few weeks in IR clinic and evaluate for angiogram  ? ?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 (HCC) ?-CBG stable, continue sliding scale insulin, meal coverage added ? ?Essential hypertension ?-Continue bisoprolol ? ?DVT prophylaxis: Heparin subcutaneous ?Code Status: Full code ?Family Communication: Discussed with patient in detail, no family at bedside ?Disposition Plan: likely 3-4days ? ?Consultants:  ?Cardiology, T CTS ? ?Procedures:  ?- R Thoracentesis  3/12-> 2.4 Liters of Amber Fluid, fluid transudative, cultures negative ?-L Thora 3/15: 1.2L drained ?-LHC 3/14 with severe multivessel CAD ? ?Antimicrobials:  ? ? ?Objective: ?Vitals:  ? 06/25/21 0400 06/25/21 0549 06/25/21 0740 06/25/21 1242  ?BP: (!) 115/55  124/61   ?Pulse: 71  73   ?Resp: 18  17   ?Temp: 97.7 ?F (36.5 ?C)  97.8 ?F (36.6 ?C)   ?TempSrc: Oral  Oral   ?SpO2: 96%  96% 97%  ?Weight:  81.6 kg    ?Height:      ? ? ?Intake/Output Summary (Last 24 hours) at 06/25/2021 1439 ?  Last data filed at 06/25/2021 0500 ?Gross per 24 hour  ?Intake --  ?Output 1450 ml  ?Net -1450  ml  ? ?Filed Weights  ? 06/23/21 0520 06/24/21 0555 06/25/21 0549  ?Weight: 80.6 kg 81.9 kg 81.6 kg  ? ? ?Examination: ? ?General exam: Pleasant male sitting up in bed, AAOx3, no distress ?HEENT: + JVD ?CVS: S1-S2, regular rate rhythm ?Lungs: Decreased breath sounds bases ?Abdomen: Soft, nontender, bowel sounds present ?Extremities: Right great toe wound, healing well, status post amputation with dressing ?Skin: As above ?Psychiatry:  Mood & affect appropriate.  ? ? ? ?Data Reviewed:  ? ?CBC: ?Recent Labs  ?Lab 06/19/21 ?0125 06/20/21 ?0310 06/21/21 ?0307 06/22/21 ?0157 06/22/21 ?5053 06/22/21 ?9767 06/23/21 ?0249 06/24/21 ?0249  ?WBC 8.8 8.2 8.0 8.6  --   --  9.7 8.0  ?NEUTROABS 6.7 6.5 6.5 6.8  --   --  7.6  --   ?HGB 11.7* 13.5 13.2 13.9 12.2*  12.6* 12.2* 12.2* 11.3*  ?HCT 36.3* 40.4 38.7* 42.0 36.0*  37.0* 36.0* 37.7* 34.6*  ?MCV 88.1 86.5 85.4 86.6  --   --  86.1 85.6  ?PLT 184 200 191 221  --   --  230 210  ? ?Basic Metabolic Panel: ?Recent Labs  ?Lab 06/20/21 ?0310 06/21/21 ?0307 06/22/21 ?0157 06/22/21 ?3419 06/22/21 ?3790 06/23/21 ?0249 06/24/21 ?0249 06/25/21 ?2409 06/25/21 ?7353  ?NA 130* 132* 134*   < > 136 134* 132* 129* 130*  ?K 3.9 3.9 4.0   < > 3.8 4.1 3.8 3.7 3.8  ?CL 88* 89* 90*  --   --  90* 91* 88* 90*  ?CO2 34* 33* 33*  --   --  32 '30 31 29  '$ ?GLUCOSE 196* 169* 187*  --   --  182* 177* 206* 201*  ?BUN 26* 26* 29*  --   --  32* 35* 34* 31*  ?CREATININE 1.73* 1.62* 1.76*  --   --  1.63* 1.88* 1.82* 1.78*  ?CALCIUM 8.6* 8.4* 9.0  --   --  8.6* 8.2* 8.2* 8.3*  ?MG 1.8 2.1 2.1  --   --  2.0  --   --   --   ?PHOS 4.4 4.1 4.3  --   --  4.4  --   --   --   ? < > = values in this interval not displayed.  ? ?GFR: ?Estimated Creatinine Clearance: 33.6 mL/min (A) (by C-G formula based on SCr of 1.78 mg/dL (H)). ?Liver Function Tests: ?Recent Labs  ?Lab 06/20/21 ?0310 06/21/21 ?2992 06/22/21 ?0157 06/23/21 ?0249 06/24/21 ?0249  ?AST 18 13* 16 15 13*  ?ALT '9 8 9 10 8  '$ ?ALKPHOS 55 50 53 50 44  ?BILITOT 0.6  0.7 0.8 0.6 0.7  ?PROT 6.3* 5.6* 6.2* 6.1* 5.7*  ?ALBUMIN 2.9* 2.6* 2.8* 2.8* 2.6*  ? ?No results for input(s): LIPASE, AMYLASE in the last 168 hours. ?No results for input(s): AMMONIA in the last 168 hours. ?Coagulation Profile: ?No results for input(s): INR, PROTIME in the last 168 hours. ?Cardiac Enzymes: ?No results for input(s): CKTOTAL, CKMB, CKMBINDEX, TROPONINI in the last 168 hours. ?BNP (last 3 results) ?No results for input(s): PROBNP in the last 8760 hours. ?HbA1C: ?No results for input(s): HGBA1C in the last 72 hours. ?CBG: ?Recent Labs  ?Lab 06/24/21 ?1222 06/24/21 ?1655 06/24/21 ?2133 06/25/21 ?0631 06/25/21 ?1059  ?GLUCAP 245* 245* 235* 195* 191*  ? ?Lipid Profile: ?No results for input(s): CHOL, HDL, LDLCALC, TRIG, CHOLHDL, LDLDIRECT in the  last 72 hours. ?Thyroid Function Tests: ?No results for input(s): TSH, T4TOTAL, FREET4, T3FREE, THYROIDAB in the last 72 hours. ?Anemia Panel: ?No results for input(s): VITAMINB12, FOLATE, FERRITIN, TIBC, IRON, RETICCTPCT in the last 72 hours. ?Urine analysis: ?   ?Component Value Date/Time  ? COLORURINE YELLOW 04/18/2021 1635  ? APPEARANCEUR CLOUDY (A) 04/18/2021 1635  ? LABSPEC 1.025 04/18/2021 1635  ? PHURINE 5.5 04/18/2021 1635  ? GLUCOSEU >=500 (A) 04/18/2021 1635  ? HGBUR LARGE (A) 04/18/2021 1635  ? BILIRUBINUR NEGATIVE 04/18/2021 1635  ? [place] NEGATIVE 04/18/2021 1635  ? PROTEINUR 100 (A) 04/18/2021 1635  ? NITRITE NEGATIVE 04/18/2021 1635  ? LEUKOCYTESUR SMALL (A) 04/18/2021 1635  ? ?Sepsis Labs: ?'@LABRCNTIP'$ (procalcitonin:4,lacticidven:4) ? ?) ?Recent Results (from the past 240 hour(s))  ?Resp Panel by RT-PCR (Flu A&B, Covid) Nasopharyngeal Swab     Status: None  ? Collection Time: 06/18/21  3:17 PM  ? Specimen: Nasopharyngeal Swab; Nasopharyngeal(NP) swabs in vial transport medium  ?Result Value Ref Range Status  ? SARS Coronavirus 2 by RT PCR NEGATIVE NEGATIVE Final  ?  Comment: (NOTE) ?SARS-CoV-2 target nucleic acids are NOT DETECTED. ? ?The  SARS-CoV-2 RNA is generally detectable in upper respiratory ?specimens during the acute phase of infection. The lowest ?concentration of SARS-CoV-2 viral copies this assay can detect is ?138 copies/mL. A negative result doe

## 2021-06-25 NOTE — Telephone Encounter (Signed)
-----   Message from Laurice Record, MD sent at 06/25/2021  2:37 PM EDT ----- ?MRI showed left 4 th phalanx  with ulcer and possible 3rd phalanx osteomyelitis. Follow-up with podiatry to discuss possible surgical intervention.  ?

## 2021-06-25 NOTE — Progress Notes (Signed)
Overnight events reviewed. Met with the patient this morning. Hemodynamically stable, BP 120s/70s, Maintaining O2 sat in high 90s on room air. Cr 1.78. Diuresed another 2.8 L overnight.  ? ?Will plan on temp pacer, RCA atherectomy and IVUS guided PCI +/- Lcx PCI (depending on contrast use) this afternoon. No plans for upfront mechanical support given his PAD, AS, AI. Will keep Impella or IABP as bailout strategy in case hemodynamic compromise occurs during PCi. ? ?Ref to detailed note re: risk/benefit discussion on 06/24/2021. Patient would like to proceed. ? ? ?Nigel Mormon, MD ?Pager: [phone] ?Office: [phone] ? ?

## 2021-06-25 NOTE — Progress Notes (Signed)
Inpatient Diabetes Program Recommendations ? ?AACE/ADA: New Consensus Statement on Inpatient Glycemic Control (2015) ? ?Target Ranges:  Prepandial:   less than 140 mg/dL ?     Peak postprandial:   less than 180 mg/dL (1-2 hours) ?     Critically ill patients:  140 - 180 mg/dL  ? ?Lab Results  ?Component Value Date  ? GLUCAP 195 (H) 06/25/2021  ? HGBA1C 8.5 (H) 04/20/2021  ? ? ?Review of Glycemic Control ? Latest Reference Range & Units 06/24/21 05:58 06/24/21 12:22 06/24/21 16:55 06/24/21 21:33 06/25/21 06:31  ?Glucose-Capillary 70 - 99 mg/dL 174 (H) 245 (H) 245 (H) 235 (H) 195 (H)  ? ?Diabetes history: DM 2 ?Outpatient Diabetes medications: Lantus 24 units  ?Current orders for Inpatient glycemic control:  ?Novolog 0-9 units tid ? ?Inpatient Diabetes Program Recommendations:   ? ?Glucose trends increase after meal intake ? ?-   Pt may benefit from adding Novolog 3 units tid meal coverage if eating >50% of meals ? ?Thanks, ? ?Tama Headings RN, MSN, BC-ADM ?Inpatient Diabetes Coordinator ?Team Pager [phone] (8a-5p) ? ?

## 2021-06-25 NOTE — Telephone Encounter (Signed)
DOS - 07/26/21 ? ?AMPUTATION LEFT --- 28805 ? ?[employer] EFFECTIVE DATE - 04/11/21 ? ?PLAN DEDUCTIBLE - $0.00 ?OUT OF POCKET - $4,000.00 W/ $4,000.00 REMAINING ?COINSURANCE - 0% ?COPAY - $250.00 ? ? ?PER [facility] WEBSITE FOR CPT CODE 59458 NO PRIOR AUTH IS REQUIRED. ? ? ?

## 2021-06-26 ENCOUNTER — Inpatient Hospital Stay (HOSPITAL_COMMUNITY): Payer: Medicare PPO

## 2021-06-26 DIAGNOSIS — I5021 Acute systolic (congestive) heart failure: Secondary | ICD-10-CM | POA: Diagnosis not present

## 2021-06-26 LAB — BASIC METABOLIC PANEL
Anion gap: 10 (ref 5–15)
BUN: 30 mg/dL — ABNORMAL HIGH (ref 8–23)
CO2: 29 mmol/L (ref 22–32)
Calcium: 8.2 mg/dL — ABNORMAL LOW (ref 8.9–10.3)
Chloride: 91 mmol/L — ABNORMAL LOW (ref 98–111)
Creatinine, Ser: 1.76 mg/dL — ABNORMAL HIGH (ref 0.61–1.24)
GFR, Estimated: 39 mL/min — ABNORMAL LOW (ref >60)
Glucose, Bld: 206 mg/dL — ABNORMAL HIGH (ref 70–99)
Potassium: 3.8 mmol/L (ref 3.5–5.1)
Sodium: 130 mmol/L — ABNORMAL LOW (ref 135–145)

## 2021-06-26 LAB — CBC
HCT: 33 % — ABNORMAL LOW (ref 39.0–52.0)
Hemoglobin: 11.2 g/dL — ABNORMAL LOW (ref 13.0–17.0)
MCH: 28.4 pg (ref 26.0–34.0)
MCHC: 33.9 g/dL (ref 30.0–36.0)
MCV: 83.8 fL (ref 80.0–100.0)
Platelets: 206 10*3/uL (ref 150–400)
RBC: 3.94 MIL/uL — ABNORMAL LOW (ref 4.22–5.81)
RDW: 13.3 % (ref 11.5–15.5)
WBC: 7.5 10*3/uL (ref 4.0–10.5)
nRBC: 0 % (ref 0.0–0.2)

## 2021-06-26 LAB — BODY FLUID CULTURE W GRAM STAIN: Culture: NO GROWTH

## 2021-06-26 LAB — GLUCOSE, CAPILLARY
Glucose-Capillary: 174 mg/dL — ABNORMAL HIGH (ref 70–99)
Glucose-Capillary: 264 mg/dL — ABNORMAL HIGH (ref 70–99)
Glucose-Capillary: 225 mg/dL — ABNORMAL HIGH (ref 70–99)
Glucose-Capillary: 205 mg/dL — ABNORMAL HIGH (ref 70–99)

## 2021-06-26 MED ORDER — ISOSORB DINITRATE-HYDRALAZINE 20-37.5 MG PO TABS
1.0000 | ORAL_TABLET | Freq: Three times a day (TID) | ORAL | Status: DC
  Administered 2021-06-26 – 2021-06-27 (×3): 1 via ORAL
  Filled 2021-06-26 (×3): qty 1

## 2021-06-26 MED ORDER — FUROSEMIDE 40 MG PO TABS
40.0000 mg | ORAL_TABLET | Freq: Every day | ORAL | Status: DC
  Administered 2021-06-26: 40 mg via ORAL
  Filled 2021-06-26: qty 1

## 2021-06-26 MED ORDER — INSULIN GLARGINE-YFGN 100 UNIT/ML ~~LOC~~ SOLN
10.0000 [IU] | Freq: Every day | SUBCUTANEOUS | Status: DC
  Administered 2021-06-26: 10 [IU] via SUBCUTANEOUS
  Filled 2021-06-26 (×2): qty 0.1

## 2021-06-26 MED ORDER — POTASSIUM CHLORIDE ER 10 MEQ PO TBCR
10.0000 meq | EXTENDED_RELEASE_TABLET | Freq: Every day | ORAL | Status: DC
  Administered 2021-06-26 – 2021-06-29 (×4): 10 meq via ORAL
  Filled 2021-06-26 (×8): qty 1

## 2021-06-26 MED ORDER — DAPAGLIFLOZIN PROPANEDIOL 5 MG PO TABS
5.0000 mg | ORAL_TABLET | Freq: Every day | ORAL | Status: DC
  Administered 2021-06-26 – 2021-06-28 (×3): 5 mg via ORAL
  Filled 2021-06-26 (×3): qty 1

## 2021-06-26 NOTE — Progress Notes (Addendum)
?PROGRESS NOTE ? ? ? 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 ?-Presented to the ED with progressive dyspnea on exertion ?-Diagnosed with acute systolic CHF, EF down to 32-02%, diuresed with IV Lasix ?-Cardiology following, underwent left and right heart cath 3/14, noted to have severe multivessel CAD, T CTS consulted, Plavix discontinued ?-Not a candidate for CABG: No targets ?-Plan for high risk PCI x2 ? ? ?Subjective: ?-Feels well today, had some chest discomfort overnight, shortness of breath with minimal activity ? ?Assessment and Plan: ? ?* Acute systolic CHF (congestive heart failure) ([place]) ?Acute hypoxic respiratory failure ?Pleural effusions ?-ECHO showed an EF of 25 to 30%  ?-Diuresed with IV Lasix, he is 7.2 L negative ?-s/p R Thoracentesis  3/12-> 2.4 Liters of Amber Fluid, fluid transudative, cultures negative ?-L Thora 3/15: 1.2L drained ?-Cardiac MRI-"Severely Reduced LV function with regional wall motion abnormalities. Apex does not appear viable, other LV segments appear viable. ?-LHC 3/14 with severe multivessel CAD ?-Continue bisoprolol, Corlanor, Imdur ?-Underwent high risk PCI and stenting of proximal RCA yesterday ?-Restart Lasix, appears mildly fluid overloaded, kidney function is stable ? ?Multivessel CAD ?-Continue aspirin, Brilinta, bisoprolol, statin ?-Appreciate TCTS consult, not a candidate for CABG ?--Underwent high risk PCI and stenting of proximal RCA yesterday ?-Plan for LAD intervention possibly Monday ? ?AKI (acute kidney injury) ([place]), CKD  3a ?-Baseline creatinine around 1.3-1.4, now in the 1.6-1.7 range ?-Likely hemodynamically mediated, now stable ? 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  plans for transmetatarsal amputation of the left foot in April with Podiatry. ?-Had MSSA bacteremia with epidural abscess during recent hospitalization in January, TEE was negative for endocarditis ?-Seen by infectious disease, initially treated with cefepime followed by cefazolin and oral ciprofloxacin, completed antibiotic course 3/6 ? ?Peripheral vascular disease ([place]) ?-Continued Aspirin but held Plavix ?-Cards consulted and he has absent pedal pulses and follows with Dr. [name] of Podiatry and plan is for Surgical Intervention on the 4th Toe in April with a Transmetatarsal Amputation of the Left Foot in April   ?-Discussed with Dr. [name] 3/16, plan for follow-up in few weeks in IR clinic and evaluate for angiogram  ? ?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 (HCC) ?-CBG trending up, add [place]glee, continue sliding scale insulin,  ? ?Essential hypertension ?-Continue bisoprolol ? ?DVT prophylaxis: Heparin subcutaneous ?Code Status: Full code ?Family Communication: Discussed with patient in detail, no family at bedside ?Disposition Plan: likely 2 to 3 days ? ?Consultants:  ?Cardiology, T CTS ? ?Procedures:  ?- R Thoracentesis  3/12-> 2.4 Liters of Amber Fluid, fluid transudative, cultures negative ?-L Thora 3/15: 1.2L drained ?-LHC 3/14 with severe multivessel CAD ?LHC 3/17:  Targeted coronary intervention to ostial-prox RCA ?Temporary transvenous pacemaker placement ?Intravascular ultrasound (IVUS) ?Orbital atherectomy Diamondback 360 classic crown ?Successful percutaneous coronary intervention ostial-proc RCA ?PTCA and stent placement 3.5 X 18 mm Onyx drug-eluting stent ?Post dilatation with  3.5X15 mm [place] balloon at 22 atm ? ? ?Antimicrobials:  ? ? ?Objective: ?Vitals:  ? 06/26/21 0059 06/26/21 0500 06/26/21 0831 06/26/21 1138  ?  BP:  107/61 124/67 (!) 109/51  ?Pulse:  71 79 68  ?Resp:  18 (!) 22 (!) 21  ?Temp:  (!) 97.5 ?F (36.4 ?C)  97.6 ?F (36.4 ?C)  ?TempSrc:  Oral Oral Oral  ?SpO2: 96% 98% 99% 97%  ?Weight:      ?Height:      ? ? ?Intake/Output Summary (Last 24 hours) at 06/26/2021 1347 ?Last data filed at 06/26/2021 1100 ?Gross per 24 hour  ?Intake 480 ml  ?Output 825 ml  ?Net -345 ml  ? ?Filed Weights  ? 06/23/21 0520 06/24/21 0555 06/25/21 0549  ?Weight: 80.6 kg 81.9 kg 81.6 kg  ? ? ?Examination: ? ?General exam: Pleasant male sitting up in bed, AAOx3, no distress ?HEENT: Positive JVD ?CVS: S1-S2, regular rhythm ?Lungs: Few basilar rales ?Abdomen: Soft, nontender, bowel sounds present ?Extremities : Right great toe wound, healing well, status post amputation with dressing ?Skin: As above ?Psychiatry:  Mood & affect appropriate.  ? ? ? ?Data Reviewed:  ? ?CBC: ?Recent Labs  ?Lab 06/20/21 ?0310 06/21/21 ?0307 06/22/21 ?0157 06/22/21 ?9562 06/22/21 ?1308 06/23/21 ?0249 06/24/21 ?0249 06/26/21 ?6578  ?WBC 8.2 8.0 8.6  --   --  9.7 8.0 7.5  ?NEUTROABS 6.5 6.5 6.8  --   --  7.6  --   --   ?HGB 13.5 13.2 13.9 12.2*  12.6* 12.2* 12.2* 11.3* 11.2*  ?HCT 40.4 38.7* 42.0 36.0*  37.0* 36.0* 37.7* 34.6* 33.0*  ?MCV 86.5 85.4 86.6  --   --  86.1 85.6 83.8  ?PLT 200 191 221  --   --  230 210 206  ? ?Basic Metabolic Panel: ?Recent Labs  ?Lab 06/20/21 ?0310 06/21/21 ?0307 06/22/21 ?0157 06/22/21 ?4696 06/23/21 ?0249 06/24/21 ?0249 06/25/21 ?2952 06/25/21 ?8413 06/26/21 ?2440  ?NA 130* 132* 134*   < > 134* 132* 129* 130* 130*  ?K 3.9 3.9 4.0   < > 4.1 3.8 3.7 3.8 3.8  ?CL 88* 89* 90*  --  90* 91* 88* 90* 91*  ?CO2 34* 33* 33*  --  32 $R'30 31 29 29  'Vm$ ?GLUCOSE 196* 169* 187*  --  182* 177* 206* 201* 206*  ?BUN 26* 26* 29*  --  32* 35* 34* 31* 30*  ?CREATININE 1.73* 1.62* 1.76*  --  1.63* 1.88* 1.82* 1.78* 1.76*   ?CALCIUM 8.6* 8.4* 9.0  --  8.6* 8.2* 8.2* 8.3* 8.2*  ?MG 1.8 2.1 2.1  --  2.0  --   --   --   --   ?PHOS 4.4 4.1 4.3  --  4.4  --   --   --   --   ? < > = values in this interval not displayed.  ? ?GFR: ?Estimated Creatinine Clearance: 34 mL/min (A) (by C-G formula based on SCr of 1.76 mg/dL (H)). ?Liver Function Tests: ?Recent Labs  ?Lab 06/20/21 ?0310 06/21/21 ?1027 06/22/21 ?0157 06/23/21 ?0249 06/24/21 ?0249  ?AST 18 13* 16 15 13*  ?ALT $Rem'9 8 9 10 8  'tqSG$ ?ALKPHOS 55 50 53 50 44  ?BILITOT 0.6 0.7 0.8 0.6 0.7  ?PROT 6.3* 5.6* 6.2* 6.1* 5.7*  ?ALBUMIN 2.9* 2.6* 2.8* 2.8* 2.6*  ? ?No results for input(s): LIPASE, AMYLASE in the last 168 hours. ?No results for input(s): AMMONIA in the last 168 hours. ?Coagulation Profile: ?No results for input(s): INR, PROTIME in the last 168 hours. ?Cardiac Enzymes: ?No results for input(s): CKTOTAL, CKMB, CKMBINDEX, TROPONINI in the last 168 hours. ?BNP (last 3 results) ?No results  for input(s): PROBNP in the last 8760 hours. ?HbA1C: ?No results for input(s): HGBA1C in the last 72 hours. ?CBG: ?Recent Labs  ?Lab 06/25/21 ?1059 06/25/21 ?1657 06/25/21 ?2136 06/26/21 ?0736 06/26/21 ?1135  ?GLUCAP 191* 193* 254* 174* 264*  ? ?Lipid Profile: ?No results for input(s): CHOL, HDL, LDLCALC, TRIG, CHOLHDL, LDLDIRECT in the last 72 hours. ?Thyroid Function Tests: ?No results for input(s): TSH, T4TOTAL, FREET4, T3FREE, THYROIDAB in the last 72 hours. ?Anemia Panel: ?No results for input(s): VITAMINB12, FOLATE, FERRITIN, TIBC, IRON, RETICCTPCT in the last 72 hours. ?Urine analysis: ?   ?Component Value Date/Time  ? COLORURINE YELLOW 04/18/2021 1635  ? APPEARANCEUR CLOUDY (A) 04/18/2021 1635  ? LABSPEC 1.025 04/18/2021 1635  ? PHURINE 5.5 04/18/2021 1635  ? GLUCOSEU >=500 (A) 04/18/2021 1635  ? HGBUR LARGE (A) 04/18/2021 1635  ? BILIRUBINUR NEGATIVE 04/18/2021 1635  ? [place] NEGATIVE 04/18/2021 1635  ? PROTEINUR 100 (A) 04/18/2021 1635  ? NITRITE NEGATIVE 04/18/2021 1635  ? LEUKOCYTESUR SMALL  (A) 04/18/2021 1635  ? ?Sepsis Labs: ?$RemoveBefore'@LABRCNTIP'[id number]$ (procalcitonin:4,lacticidven:4) ? ?) ?Recent Results (from the past 240 hour(s))  ?Resp Panel by RT-PCR (Flu A&B, Covid) Nasopharyngeal Swab     Status: None  ? Collection

## 2021-06-26 NOTE — Progress Notes (Signed)
Cardiac Rehab Phase I ?Patient declined ambulation at this time, c/o leg pain. Encouraged to walk later with nursing staff, and patient is agreeable to this. Reviewed PCI education with patient including restrictions, risk factor modification, CP, NTG use, and calling 911, Brilinta use, and activity progression. Heart healthy, low sodium, and diabetic diet handouts given. Discussed Phase 2 cardiac rehab, and patient is interested in program at [facility], referral sent.  ? ?Sol Passer, MS, ACSM CEP ?06/26/2021 [phone] ?

## 2021-06-26 NOTE — Progress Notes (Signed)
Subjective:  ?Patient has no specific complaints.  Denies chest pain.  States that he is ready to go home. ? ?Intake/Output from previous day: ? ?I/O last 3 completed shifts: ?In: 240 [P.O.:240] ?Out: 750 [Urine:750] ?Total I/O ?In: 240 [P.O.:240] ?Out: 350 [Urine:350] ? ?Blood pressure 124/67, pulse 79, temperature (!) 97.5 ?F (36.4 ?C), temperature source Oral, resp. rate (!) 22, height $RemoveBe'5\' 8"'sCeWzYCIC$  (1.727 m), weight 81.6 kg, SpO2 99 %. ?Physical Exam ?Neck:  ?   Vascular: No JVD.  ?Cardiovascular:  ?   Rate and Rhythm: Normal rate and regular rhythm.  ?   Pulses: Intact distal pulses.     ?     Carotid pulses are 2+ on the right side and 2+ on the left side. ?     Dorsalis pedis pulses are 0 on the right side and 0 on the left side.  ?     Posterior tibial pulses are 0 on the right side and 0 on the left side.  ?   Heart sounds: Murmur heard.  ?  No gallop.  ?Pulmonary:  ?   Effort: Pulmonary effort is normal.  ?   Breath sounds: Normal breath sounds.  ?Abdominal:  ?   General: Bowel sounds are normal.  ?   Palpations: Abdomen is soft.  ?Musculoskeletal:  ?   Right lower leg: No edema.  ?   Left lower leg: No edema.  ? ? ?Lab Results: ?BMP ?BNP (last 3 results) ?Recent Labs  ?  06/18/21 ?1721  ?BNP 2,098.7*  ? ? ?ProBNP (last 3 results) ?No results for input(s): PROBNP in the last 8760 hours. ?BMP Latest Ref Rng & Units 06/26/2021 06/25/2021 06/25/2021  ?Glucose 70 - 99 mg/dL 206(H) 201(H) 206(H)  ?BUN 8 - 23 mg/dL 30(H) 31(H) 34(H)  ?Creatinine 0.61 - 1.24 mg/dL 1.76(H) 1.78(H) 1.82(H)  ?Sodium 135 - 145 mmol/L 130(L) 130(L) 129(L)  ?Potassium 3.5 - 5.1 mmol/L 3.8 3.8 3.7  ?Chloride 98 - 111 mmol/L 91(L) 90(L) 88(L)  ?CO2 22 - 32 mmol/L $RemoveB'29 29 31  'RjtOWOEU$ ?Calcium 8.9 - 10.3 mg/dL 8.2(L) 8.3(L) 8.2(L)  ? ?Hepatic Function Latest Ref Rng & Units 06/24/2021 06/23/2021 06/22/2021  ?Total Protein 6.5 - 8.1 g/dL 5.7(L) 6.1(L) 6.2(L)  ?Albumin 3.5 - 5.0 g/dL 2.6(L) 2.8(L) 2.8(L)  ?AST 15 - 41 U/L 13(L) 15 16  ?ALT 0 - 44 U/L $Remo'8 10 9  'JOIdz$ ?Alk  Phosphatase 38 - 126 U/L 44 50 53  ?Total Bilirubin 0.3 - 1.2 mg/dL 0.7 0.6 0.8  ?Bilirubin, Direct 0.0 - 0.2 mg/dL - - -  ? ?CBC Latest Ref Rng & Units 06/26/2021 06/24/2021 06/23/2021  ?WBC 4.0 - 10.5 K/uL 7.5 8.0 9.7  ?Hemoglobin 13.0 - 17.0 g/dL 11.2(L) 11.3(L) 12.2(L)  ?Hematocrit 39.0 - 52.0 % 33.0(L) 34.6(L) 37.7(L)  ?Platelets 150 - 400 K/uL 206 210 230  ? ?Lipid Panel  ?   ?Component Value Date/Time  ? CHOL 155 06/20/2021 0310  ? TRIG 84 06/20/2021 0310  ? HDL 41 06/20/2021 0310  ? CHOLHDL 3.8 06/20/2021 0310  ? VLDL 17 06/20/2021 0310  ? [place] 97 06/20/2021 0310  ?Cardiac Panel (last 3 results) ?Recent Labs  ?  06/24/21 ?2029 06/25/21 ?0037  ?TROPONINIHS 41* 34*  ?  ? ?HEMOGLOBIN A1C ?Lab Results  ?Component Value Date  ? HGBA1C 8.5 (H) 04/20/2021  ? MPG 197.25 04/20/2021  ? ?TSH ?Recent Labs  ?  04/18/21 ?2009  ?TSH 1.263  ? ?Imaging: ?Imaging results have been reviewed ? ?Cardiac  Studies: ? ?EKG:  ?EKG 06/26/2021: Normal sinus rhythm at rate of 73 bpm, normal axis, poor R wave progression, anteroseptal infarct old.  Nonspecific T abnormality.  Compared to 06/25/2021 no significant change. ? ?Echocardiogram 06/19/21:    ?1. Left ventricular ejection fraction, by estimation, is 25 to 30%. The left ventricle has severely decreased function. The left ventricle demonstrates regional wall motion abnormalities (see scoring diagram/findings for description). Left ventricular  ?diastolic function could not be evaluated. There is severe hypokinesis of the left ventricular, mid-apical anteroseptal wall, anterior wall and apical segment. ? 2. Right ventricular systolic function is mildly reduced. The right ventricular size is normal. There is normal pulmonary artery systolic pressure. ? 3. Left atrial size was mildly dilated. ? 4. Right atrial size was mildly dilated. ? 5. The mitral valve is abnormal. Moderate mitral valve regurgitation. Moderate mitral stenosis. Moderate mitral annular calcification. Mean PG 9 mmHg,  MVA 1.34 cm2 by PHT method, likely more prominent due to resting tachycardia around 104 bpm. ? 6. Tricuspid valve regurgitation is mild to moderate. ? 7. The aortic valve is tricuspid. There is moderate calcification of the aortic valve. Aortic valve regurgitation is moderate. Aortic valve area, by VTI measures 0.87 cm?Marland Kitchen Aortic valve mean gradient measures 22.0 mmHg. Aortic valve Vmax measures 3.14  ?m/s. Dimensionless index 0.29. ? 8. Moderate pleural effusion. ? 9. Compared to previous transthoracic and transesophageal studies in 04/2021, LVEF is reduced from 40-45%. Regional wall motion abnormalities (LAD territory) are more pronounced Mild RV dysfunctinon is new. Valvular findings are relatively unchanged. ? ? ?Coronary angioplasty 06/25/2021:  ?RCA intervention: ?Severe prox-ostial calcific 80-95% stenoses ?Diffuse disease through the vessel, at least 50% ?Mid focal 80% stenosis ?  ?  ?Targeted coronary intervention to ostial-prox RCA ?Temporary transvenous pacemaker placement ?Intravascular ultrasound (IVUS) ?Orbital atherectomy Diamondback 360 classic crown ?Successful percutaneous coronary intervention ostial-proc RCA ?       PTCA and stent placement 3.5 X 18 mm Onyx drug-eluting stent ?       Post dilatation with 3.5X15 mm [place] balloon at 22 atm ?       0% residual stenosis at the site of the stenosis ?       TIMI flow I-->II ?Scheduled Meds: ? aspirin EC  81 mg Oral Daily  ? bisoprolol  2.5 mg Oral Daily  ? furosemide  40 mg Oral Daily  ? heparin  5,000 Units Subcutaneous Q8H  ? insulin aspart  0-9 Units Subcutaneous TID WC  ? isosorbide-hydrALAZINE  1 tablet Oral TID  ? ivabradine  5 mg Oral BID WC  ? potassium chloride  10 mEq Oral Daily  ? rosuvastatin  20 mg Oral Daily  ? sodium chloride flush  3 mL Intravenous Q12H  ? sodium chloride flush  3 mL Intravenous Q12H  ? sodium chloride flush  3 mL Intravenous Q12H  ? ticagrelor  90 mg Oral BID  ? ?Continuous Infusions: ? sodium chloride    ? sodium chloride     ? ?PRN Meds:.sodium chloride, sodium chloride, acetaminophen, nitroGLYCERIN, ondansetron (ZOFRAN) IV, sodium chloride flush, sodium chloride flush ? ?Assessment/Plan:  ?Zachary [name] is a 76 y.o. male patient with hypertension, uncontrolled type 2 diabetes mellitus with stage IIIb chronic kidney disease, peripheral artery disease, h/o osteomyelitis s/p right toe amputation, nonhealing wound left toe, h/o epidural abscess and MSSA bacteremia 04/2021, critical limb ischemia, moderate mitral and aortic stenosis, now admitted with acute decompensated systolic heart failure and cardiac catheterization revealing multivessel disease. ?1.  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 ?2.  Primary hypertension, blood pressure has been soft ?3.  Acute on chronic systolic heart failure ?4.  Hypercholesterolemia ?5.  Diabetes mellitus, uncontrolled with hyperglycemia with stage IIIb chronic kidney disease and vascular complication. ? ?Recommendation: I have switched him to p.o. furosemide.  We will continue dual antiplatelet therapy with aspirin and ticagrelor for repeated of 6 months in view of chronic coronary artery disease and PCI with DES.  He will need probable staged intervention to circumflex coronary artery in view of chronic kidney disease. ?Blood pressure is soft however he is tolerating bisoprolol low-dose and also BiDil low-dose.  We will increase BiDil to 1 tablet 3 times a day continue bisoprolol 2.5 mg daily. ?He is now on high intensity statin with Crestor 20 mg daily, continue the same. ?Renal function has remained stable, needs improvement in diabetes mellitus, as his renal function has improved and stabilized, will add [place].  Will help with heart failure management, also will be able to use less dose of diuretics.  Will avoid ACE inhibitors and ARB. D/C Corlanor until we maximize BB therapy and if tachycardia persists then will  consider this.  From cardiac standpoint stable. ? ? ?Adrian Prows, MD, [facility] ?06/26/2021, 9:59 AM ?Office: [phone] ?Fax: [phone] ?Pager: [phone]  ?

## 2021-06-27 DIAGNOSIS — I5021 Acute systolic (congestive) heart failure: Secondary | ICD-10-CM | POA: Diagnosis not present

## 2021-06-27 LAB — BASIC METABOLIC PANEL
Anion gap: 11 (ref 5–15)
BUN: 29 mg/dL — ABNORMAL HIGH (ref 8–23)
CO2: 29 mmol/L (ref 22–32)
Calcium: 8.4 mg/dL — ABNORMAL LOW (ref 8.9–10.3)
Chloride: 90 mmol/L — ABNORMAL LOW (ref 98–111)
Creatinine, Ser: 1.96 mg/dL — ABNORMAL HIGH (ref 0.61–1.24)
GFR, Estimated: 35 mL/min — ABNORMAL LOW (ref >60)
Glucose, Bld: 153 mg/dL — ABNORMAL HIGH (ref 70–99)
Potassium: 3.8 mmol/L (ref 3.5–5.1)
Sodium: 130 mmol/L — ABNORMAL LOW (ref 135–145)

## 2021-06-27 LAB — GLUCOSE, CAPILLARY
Glucose-Capillary: 144 mg/dL — ABNORMAL HIGH (ref 70–99)
Glucose-Capillary: 130 mg/dL — ABNORMAL HIGH (ref 70–99)
Glucose-Capillary: 155 mg/dL — ABNORMAL HIGH (ref 70–99)
Glucose-Capillary: 157 mg/dL — ABNORMAL HIGH (ref 70–99)

## 2021-06-27 LAB — BRAIN NATRIURETIC PEPTIDE: B Natriuretic Peptide: 1464.5 pg/mL — ABNORMAL HIGH (ref 0.0–100.0)

## 2021-06-27 MED ORDER — FUROSEMIDE 10 MG/ML IJ SOLN: 40.0000 mg | Freq: Two times a day (BID) | INTRAMUSCULAR | Status: DC

## 2021-06-27 MED ORDER — INSULIN GLARGINE-YFGN 100 UNIT/ML ~~LOC~~ SOLN
12.0000 [IU] | Freq: Every day | SUBCUTANEOUS | Status: DC
  Administered 2021-06-27 – 2021-06-29 (×3): 12 [IU] via SUBCUTANEOUS
  Filled 2021-06-27 (×3): qty 0.12

## 2021-06-27 MED ORDER — FUROSEMIDE 10 MG/ML IJ SOLN
40.0000 mg | Freq: Once | INTRAMUSCULAR | Status: AC
Start: 2021-06-27 — End: 2021-06-27
  Administered 2021-06-27: 40 mg via INTRAVENOUS

## 2021-06-27 MED ORDER — TORSEMIDE 20 MG PO TABS
20.0000 mg | ORAL_TABLET | Freq: Two times a day (BID) | ORAL | Status: DC
  Administered 2021-06-27 – 2021-06-28 (×2): 20 mg via ORAL
  Filled 2021-06-27 (×3): qty 1

## 2021-06-27 MED ORDER — ISOSORB DINITRATE-HYDRALAZINE 20-37.5 MG PO TABS
0.5000 | ORAL_TABLET | Freq: Three times a day (TID) | ORAL | Status: DC
  Administered 2021-06-28 – 2021-06-29 (×4): 0.5 via ORAL
  Filled 2021-06-27 (×5): qty 1

## 2021-06-27 MED ORDER — MORPHINE SULFATE (PF) 2 MG/ML IV SOLN
1.0000 mg | Freq: Once | INTRAVENOUS | Status: AC
  Administered 2021-06-27: 1 mg via INTRAVENOUS
  Filled 2021-06-27: qty 1

## 2021-06-27 NOTE — Progress Notes (Addendum)
?PROGRESS NOTE ? ? ? ?Zachary [name]  EHO:[id number] DOB: [birth year] DOA: 06/18/2021 ?PCP: [name], FNP  ?Narrative 75/F with history of uncontrolled type 2 diabetes mellitus, hypertension, dyslipidemia, peripheral vascular disease, recent complicated hospitalization hospitalization with osteomyelitis, MSSA bacteremia and epidural abscess, status post right great toe amputation 1/10 ,TEE negative for endocarditis, completed prolonged course of IV cefazolin and oral ciprofloxacin on 3/6. ?-Presented to the ED with progressive dyspnea on exertion ?-Diagnosed with acute systolic CHF, EF down to 37-04%, diuresed with IV Lasix ?-Cardiology following, underwent left and right heart cath 3/14, noted to have severe multivessel CAD, T CTS consulted, Plavix discontinued ?-Not a candidate for CABG: No targets ?-Underwent high risk PCI of proximal RCA 3/17 ? ? ?Subjective: ?-Feels tired, short of breath with activity, denies any chest pain ? ?Assessment and Plan: ? ?* Acute systolic CHF (congestive heart failure) ([place]) ?Acute hypoxic respiratory failure ?Pleural effusions ?-ECHO showed an EF of 25 to 30%  ?-Diuresed with IV Lasix, he is 7.6 L negative ?-s/p R Thoracentesis  3/12-> 2.4 Liters of Amber Fluid, fluid transudative, cultures negative ?-L Thora 3/15: 1.2L drained ?-Cardiac MRI-"Severely Reduced LV function with regional wall motion abnormalities. Apex does not appear viable, other LV segments appear viable. ?-LHC 3/14 with severe multivessel CAD ?-Continue bisoprolol, Corlanor, Imdur, farxiga ?-Underwent high risk PCI and stenting of proximal RCA  ?-Remains volume overloaded, started on torsemide today per cards, repeat x-ray with small bilateral pleural effusions, not large enough for thoracentesis at this time ? ?Multivessel CAD ?-Continue aspirin, Brilinta, bisoprolol, statin ?-Appreciate TCTS consult, not a candidate for CABG ?--Underwent high risk PCI and stenting of proximal RCA 3/17 ? ?AKI (acute  kidney injury) ([place]), CKD 3a ?-Baseline creatinine around 1.3-1.4, now in the 1.6-1.7 range ?-Likely hemodynamically mediated, now stable ? 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  plans for transmetatarsal amputation of the left foot in April with Podiatry. ?-Had MSSA bacteremia with epidural abscess during recent hospitalization in January, TEE was negative for endocarditis ?-Seen by infectious disease, initially treated with cefepime followed by cefazolin and oral ciprofloxacin, completed antibiotic course 3/6 ? ?Peripheral vascular disease ([place]) ?-Continued Aspirin but held Plavix ?-Cards consulted and he has absent pedal pulses and follows with Dr. [name] of Podiatry and plan is for Surgical Intervention on the 4th Toe in April with a Transmetatarsal Amputation of the Left Foot in April   ?-Discussed with Dr. [name] 3/16, plan for follow-up in few weeks in IR clinic and evaluate for angiogram  ? ?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 (HCC) ?-CBG improving continue Semglee, continue sliding scale insulin,  ? ?Essential hypertension ?-Continue bisoprolol ? ?DVT prophylaxis: Heparin subcutaneous ?Code Status: Full code ?Family Communication: Discussed with patient in detail, no family at bedside ?Disposition Plan: likely 2 to 3 days ? ?Consultants:  ?Cardiology, T CTS ? ?Procedures:  ?- R Thoracentesis  3/12-> 2.4 Liters of Amber Fluid, fluid transudative, cultures negative ?-L Thora 3/15: 1.2L drained ?-LHC 3/14 with severe multivessel CAD ?LHC 3/17:  Targeted coronary intervention to ostial-prox RCA ?Temporary transvenous pacemaker placement ?Intravascular ultrasound (IVUS) ?Orbital atherectomy Diamondback 360 classic crown ?Successful percutaneous coronary intervention ostial-proc RCA ?PTCA and stent placement 3.5 X 18 mm Onyx drug-eluting  stent ?Post dilatation with 3.5X15 mm [place] balloon at 22 atm ? ? ?Antimicrobials:  ? ? ?Objective: ?Vitals:  ? 06/26/21  1602 06/26/21 2030 06/27/21 0500 06/27/21 0947  ?BP: 100/68 (!) 115/54 (!) 113/56 102/66  ?Pulse:  71 70 72  ?Resp: $Remov'20 18 18 15  'ImafJC$ ?Temp:  97.7 ?F (36.5 ?C) (!) 97.5 ?F (36.4 ?C) 97.6 ?F (36.4 ?C)  ?TempSrc:  Oral Oral Oral  ?SpO2:  94% 95% 94%  ?Weight:   79.9 kg   ?Height:      ? ? ?Intake/Output Summary (Last 24 hours) at 06/27/2021 1206 ?Last data filed at 06/26/2021 1649 ?Gross per 24 hour  ?Intake --  ?Output 450 ml  ?Net -450 ml  ? ?Filed Weights  ? 06/24/21 0555 06/25/21 0549 06/27/21 0500  ?Weight: 81.9 kg 81.6 kg 79.9 kg  ? ? ?Examination: ? ?General exam: Pleasant male sitting up in bed, AAOx3, no distress ?HEENT: Positive JVD ?CVS: S1-S2, regular rate rhythm ?Lungs: Bilateral basilar rales noted ?Abdomen: Soft, nontender, bowel sounds present  ?Extremities : Right great toe wound, healing well, status post amputation with dressing ?Skin: As above ?Psychiatry:  Mood & affect appropriate.  ? ? ? ?Data Reviewed:  ? ?CBC: ?Recent Labs  ?Lab 06/21/21 ?0307 06/22/21 ?0157 06/22/21 ?9735 06/22/21 ?3299 06/23/21 ?0249 06/24/21 ?0249 06/26/21 ?2426  ?WBC 8.0 8.6  --   --  9.7 8.0 7.5  ?NEUTROABS 6.5 6.8  --   --  7.6  --   --   ?HGB 13.2 13.9 12.2*  12.6* 12.2* 12.2* 11.3* 11.2*  ?HCT 38.7* 42.0 36.0*  37.0* 36.0* 37.7* 34.6* 33.0*  ?MCV 85.4 86.6  --   --  86.1 85.6 83.8  ?PLT 191 221  --   --  230 210 206  ? ?Basic Metabolic Panel: ?Recent Labs  ?Lab 06/21/21 ?0307 06/22/21 ?0157 06/22/21 ?8341 06/23/21 ?0249 06/24/21 ?0249 06/25/21 ?9622 06/25/21 ?2979 06/26/21 ?8921 06/27/21 ?0223  ?NA 132* 134*   < > 134* 132* 129* 130* 130* 130*  ?K 3.9 4.0   < > 4.1 3.8 3.7 3.8 3.8 3.8  ?CL 89* 90*  --  90* 91* 88* 90* 91* 90*  ?CO2 33* 33*  --  32 $R'30 31 29 29 29  'KL$ ?GLUCOSE 169* 187*  --  182* 177* 206* 201* 206* 153*  ?BUN 26* 29*  --  32* 35* 34* 31* 30* 29*  ?CREATININE 1.62* 1.76*  --  1.63* 1.88* 1.82*  1.78* 1.76* 1.96*  ?CALCIUM 8.4* 9.0  --  8.6* 8.2* 8.2* 8.3* 8.2* 8.4*  ?MG 2.1 2.1  --  2.0  --   --   --   --   --   ?PHOS 4.1 4.3  --  4.4  --   --   --   --   --   ? < > = values in this interval not displayed.  ? ?GFR: ?Estimated Creatinine Clearance: 30.5 mL/min (A) (by C-G formula based on SCr of 1.96 mg/dL (H)). ?Liver Function Tests: ?Recent Labs  ?Lab 06/21/21 ?0307 06/22/21 ?0157 06/23/21 ?1941 06/24/21 ?7408  ?AST 13* 16 15 13*  ?ALT $Rem'8 9 10 8  'rpMS$ ?ALKPHOS 50 53 50 44  ?BILITOT 0.7 0.8 0.6 0.7  ?PROT 5.6* 6.2* 6.1* 5.7*  ?ALBUMIN 2.6* 2.8* 2.8* 2.6*  ? ?No results for input(s): LIPASE, AMYLASE in the last 168 hours. ?No results for input(s): AMMONIA in the last 168 hours. ?Coagulation Profile: ?No results for input(s): INR, PROTIME in the last 168 hours. ?Cardiac Enzymes: ?No results for input(s): CKTOTAL, CKMB, CKMBINDEX, TROPONINI in the last 168 hours. ?BNP (last 3 results) ?No  results for input(s): PROBNP in the last 8760 hours. ?HbA1C: ?No results for input(s): HGBA1C in the last 72 hours. ?CBG: ?Recent Labs  ?Lab 06/26/21 ?0736 06/26/21 ?1135 06/26/21 ?1520 06/26/21 ?2039 06/27/21 ?0745  ?GLUCAP 174* 264* 225* 205* 144*  ? ?Lipid Profile: ?No results for input(s): CHOL, HDL, LDLCALC, TRIG, CHOLHDL, LDLDIRECT in the last 72 hours. ?Thyroid Function Tests: ?No results for input(s): TSH, T4TOTAL, FREET4, T3FREE, THYROIDAB in the last 72 hours. ?Anemia Panel: ?No results for input(s): VITAMINB12, FOLATE, FERRITIN, TIBC, IRON, RETICCTPCT in the last 72 hours. ?Urine analysis: ?   ?Component Value Date/Time  ? COLORURINE YELLOW 04/18/2021 1635  ? APPEARANCEUR CLOUDY (A) 04/18/2021 1635  ? LABSPEC 1.025 04/18/2021 1635  ? PHURINE 5.5 04/18/2021 1635  ? GLUCOSEU >=500 (A) 04/18/2021 1635  ? HGBUR LARGE (A) 04/18/2021 1635  ? BILIRUBINUR NEGATIVE 04/18/2021 1635  ? [place] NEGATIVE 04/18/2021 1635  ? PROTEINUR 100 (A) 04/18/2021 1635  ? NITRITE NEGATIVE 04/18/2021 1635  ? LEUKOCYTESUR SMALL (A) 04/18/2021  1635  ? ?Sepsis Labs: ?$RemoveBefore'@LABRCNTIP'[id number]$ (procalcitonin:4,lacticidven:4) ? ?) ?Recent Results (from the past 240 hour(s))  ?Resp Panel by RT-PCR (Flu A&B, Covid) Nasopharyngeal Swab     Status: None  ? Collection Time

## 2021-06-27 NOTE — Progress Notes (Signed)
Subjective:  ?Complaints of shortness of breath, fatigue. ? ?Intake/Output from previous day: ? ?I/O last 3 completed shifts: ?In: 480 [P.O.:480] ?Out: 1075 [Urine:1075] ?Total I/O ?In: -  ?Out: 125 [Urine:125] ? ?Blood pressure (!) 106/48, pulse 70, temperature (!) 96.9 ?F (36.1 ?C), temperature source Axillary, resp. rate (!) 26, height $RemoveBe'5\' 8"'tBJUxUjgi$  (1.727 m), weight 79.9 kg, SpO2 98 %. ?Physical Exam ?Neck:  ?   Vascular: No JVD.  ?Cardiovascular:  ?   Rate and Rhythm: Normal rate and regular rhythm.  ?   Pulses: Intact distal pulses.     ?     Carotid pulses are 2+ on the right side and 2+ on the left side. ?     Dorsalis pedis pulses are 0 on the right side and 0 on the left side.  ?     Posterior tibial pulses are 0 on the right side and 0 on the left side.  ?   Heart sounds: Murmur heard.  ?  No gallop.  ?Pulmonary:  ?   Effort: Pulmonary effort is normal.  ?   Breath sounds: Examination of the right-middle field reveals decreased breath sounds. Examination of the right-lower field reveals decreased breath sounds. Examination of the left-lower field reveals decreased breath sounds. Decreased breath sounds present.  ?Abdominal:  ?   General: Bowel sounds are normal.  ?   Palpations: Abdomen is soft.  ?Musculoskeletal:  ?   Right lower leg: No edema.  ?   Left lower leg: No edema.  ? ? ?Lab Results: ?BMP ?BNP (last 3 results) ?Recent Labs  ?  06/18/21 ?1721 06/27/21 ?0930  ?BNP 2,098.7* 1,464.5*  ? ? ?ProBNP (last 3 results) ?No results for input(s): PROBNP in the last 8760 hours. ?BMP Latest Ref Rng & Units 06/27/2021 06/26/2021 06/25/2021  ?Glucose 70 - 99 mg/dL 153(H) 206(H) 201(H)  ?BUN 8 - 23 mg/dL 29(H) 30(H) 31(H)  ?Creatinine 0.61 - 1.24 mg/dL 1.96(H) 1.76(H) 1.78(H)  ?Sodium 135 - 145 mmol/L 130(L) 130(L) 130(L)  ?Potassium 3.5 - 5.1 mmol/L 3.8 3.8 3.8  ?Chloride 98 - 111 mmol/L 90(L) 91(L) 90(L)  ?CO2 22 - 32 mmol/L $RemoveB'29 29 29  'oAfCHazW$ ?Calcium 8.9 - 10.3 mg/dL 8.4(L) 8.2(L) 8.3(L)  ? ?Hepatic Function Latest Ref Rng &  Units 06/24/2021 06/23/2021 06/22/2021  ?Total Protein 6.5 - 8.1 g/dL 5.7(L) 6.1(L) 6.2(L)  ?Albumin 3.5 - 5.0 g/dL 2.6(L) 2.8(L) 2.8(L)  ?AST 15 - 41 U/L 13(L) 15 16  ?ALT 0 - 44 U/L $Remo'8 10 9  'jZiQv$ ?Alk Phosphatase 38 - 126 U/L 44 50 53  ?Total Bilirubin 0.3 - 1.2 mg/dL 0.7 0.6 0.8  ?Bilirubin, Direct 0.0 - 0.2 mg/dL - - -  ? ?CBC Latest Ref Rng & Units 06/26/2021 06/24/2021 06/23/2021  ?WBC 4.0 - 10.5 K/uL 7.5 8.0 9.7  ?Hemoglobin 13.0 - 17.0 g/dL 11.2(L) 11.3(L) 12.2(L)  ?Hematocrit 39.0 - 52.0 % 33.0(L) 34.6(L) 37.7(L)  ?Platelets 150 - 400 K/uL 206 210 230  ? ?Lipid Panel  ?   ?Component Value Date/Time  ? CHOL 155 06/20/2021 0310  ? TRIG 84 06/20/2021 0310  ? HDL 41 06/20/2021 0310  ? CHOLHDL 3.8 06/20/2021 0310  ? VLDL 17 06/20/2021 0310  ? [place] 97 06/20/2021 0310  ?Cardiac Panel (last 3 results) ?Recent Labs  ?  06/24/21 ?2029 06/25/21 ?0037  ?TROPONINIHS 41* 34*  ? ?  ?BNP (last 3 results) ?Recent Labs  ?  06/18/21 ?1721 06/27/21 ?0930  ?BNP 2,098.7* 1,464.5*  ? ? ?ProBNP (last  3 results) ?No results for input(s): PROBNP in the last 8760 hours.  ? ?HEMOGLOBIN A1C ?Lab Results  ?Component Value Date  ? HGBA1C 8.5 (H) 04/20/2021  ? MPG 197.25 04/20/2021  ? ?TSH ?Recent Labs  ?  04/18/21 ?2009  ?TSH 1.263  ? ? ?Imaging: ?Imaging results have been reviewed ? ?Cardiac Studies: ? ?EKG:  ?EKG 06/26/2021: Normal sinus rhythm at rate of 73 bpm, normal axis, poor R wave progression, anteroseptal infarct old.  Nonspecific T abnormality.  Compared to 06/25/2021 no significant change. ? ?Echocardiogram 06/19/21:    ?1. Left ventricular ejection fraction, by estimation, is 25 to 30%. The left ventricle has severely decreased function. The left ventricle demonstrates regional wall motion abnormalities (see scoring diagram/findings for description). Left ventricular  ?diastolic function could not be evaluated. There is severe hypokinesis of the left ventricular, mid-apical anteroseptal wall, anterior wall and apical segment. ? 2.  Right ventricular systolic function is mildly reduced. The right ventricular size is normal. There is normal pulmonary artery systolic pressure. ? 3. Left atrial size was mildly dilated. ? 4. Right atrial size was mildly dilated. ? 5. The mitral valve is abnormal. Moderate mitral valve regurgitation. Moderate mitral stenosis. Moderate mitral annular calcification. Mean PG 9 mmHg, MVA 1.34 cm2 by PHT method, likely more prominent due to resting tachycardia around 104 bpm. ? 6. Tricuspid valve regurgitation is mild to moderate. ? 7. The aortic valve is tricuspid. There is moderate calcification of the aortic valve. Aortic valve regurgitation is moderate. Aortic valve area, by VTI measures 0.87 cm?Marland Kitchen Aortic valve mean gradient measures 22.0 mmHg. Aortic valve Vmax measures 3.14  ?m/s. Dimensionless index 0.29. ? 8. Moderate pleural effusion. ? 9. Compared to previous transthoracic and transesophageal studies in 04/2021, LVEF is reduced from 40-45%. Regional wall motion abnormalities (LAD territory) are more pronounced Mild RV dysfunctinon is new. Valvular findings are relatively unchanged. ? ? ?Coronary angioplasty 06/25/2021:  ?RCA intervention: ?Severe prox-ostial calcific 80-95% stenoses ?Diffuse disease through the vessel, at least 50% ?Mid focal 80% stenosis ?  ?  ?Targeted coronary intervention to ostial-prox RCA ?Temporary transvenous pacemaker placement ?Intravascular ultrasound (IVUS) ?Orbital atherectomy Diamondback 360 classic crown ?Successful percutaneous coronary intervention ostial-proc RCA ?       PTCA and stent placement 3.5 X 18 mm Onyx drug-eluting stent ?       Post dilatation with 3.5X15 mm [place] balloon at 22 atm ?       0% residual stenosis at the site of the stenosis ?       TIMI flow I-->II ?Scheduled Meds: ? aspirin EC  81 mg Oral Daily  ? bisoprolol  2.5 mg Oral Daily  ? dapagliflozin propanediol  5 mg Oral Daily  ? heparin  5,000 Units Subcutaneous Q8H  ? insulin aspart  0-9 Units Subcutaneous  TID WC  ? insulin glargine-yfgn  12 Units Subcutaneous Daily  ? isosorbide-hydrALAZINE  0.5 tablet Oral TID  ? ivabradine  5 mg Oral BID WC  ? potassium chloride  10 mEq Oral Daily  ? rosuvastatin  20 mg Oral Daily  ? sodium chloride flush  3 mL Intravenous Q12H  ? sodium chloride flush  3 mL Intravenous Q12H  ? sodium chloride flush  3 mL Intravenous Q12H  ? ticagrelor  90 mg Oral BID  ? torsemide  20 mg Oral BID  ? ?Continuous Infusions: ? sodium chloride    ? sodium chloride    ? ?PRN Meds:.sodium chloride, sodium chloride, acetaminophen, nitroGLYCERIN, ondansetron (ZOFRAN)  IV, sodium chloride flush, sodium chloride flush ? ?Assessment/Plan:  ?Zachary [name] is a 76 y.o. male patient with hypertension, uncontrolled type 2 diabetes mellitus with stage IIIb chronic kidney disease, peripheral artery disease, h/o osteomyelitis s/p right toe amputation, nonhealing wound left toe, h/o epidural abscess and MSSA bacteremia 04/2021, critical limb ischemia, moderate mitral and aortic stenosis, now admitted with acute decompensated systolic heart failure and cardiac catheterization revealing multivessel disease. ?1.  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 ?2.  Primary hypertension, blood pressure has been soft ?3.  Acute on chronic systolic heart failure ?4.   Diabetes mellitus, uncontrolled with hyperglycemia with stage IIIb chronic kidney disease and vascular complication. ?5.  Hyponatremia due to volume excess ? ?Recommendation:  ?I have discussed with primary team.  Patient still in markedly fluid overload state with  hyponatremia due to volume excess, I will try torsemide 20 mg twice daily, continue [place] and also BiDil 1 p.o. 3 times daily.  No ACE inhibitor's or ARB due to renal failure.  Serum creatinine has remained stable. ? ?Patient is not stable for discharge, needs further stabilization in view of excess volume.  If by afternoon he  has not responded to torsemide, will increase the dose to 40 mg 3 times daily.  I may consider use of Samska if no response by tomorrow ? ? ? ?Adrian Prows, MD, [facility] ?06/27/2021, 2:10 PM ?Office: [phone] ?F

## 2021-06-27 NOTE — Progress Notes (Signed)
Was called to patient room by his wife saying he was having trouble breathing. Patient had increased RR 26-28. O2 was 90%, came up to 99% on 3 L. Assisted patient to sit on the side of the bed and lean over the table. Lung sounds asseessed, very diminished in the right side especially. MD Broadus John paged and spoke via telephone.  ? ?Order received for 1 mg IV morphine and 40 mg IV lasix. BP 111/49 checked manually. Stable at this time, will continue to monitor. ?

## 2021-06-28 ENCOUNTER — Other Ambulatory Visit (HOSPITAL_COMMUNITY): Payer: Self-pay

## 2021-06-28 ENCOUNTER — Encounter (HOSPITAL_COMMUNITY): Payer: Self-pay | Admitting: Cardiology

## 2021-06-28 DIAGNOSIS — I5021 Acute systolic (congestive) heart failure: Secondary | ICD-10-CM | POA: Diagnosis not present

## 2021-06-28 LAB — CBC
HCT: 35.5 % — ABNORMAL LOW (ref 39.0–52.0)
Hemoglobin: 12 g/dL — ABNORMAL LOW (ref 13.0–17.0)
MCH: 28.8 pg (ref 26.0–34.0)
MCHC: 33.8 g/dL (ref 30.0–36.0)
MCV: 85.1 fL (ref 80.0–100.0)
Platelets: 237 10*3/uL (ref 150–400)
RBC: 4.17 MIL/uL — ABNORMAL LOW (ref 4.22–5.81)
RDW: 13.2 % (ref 11.5–15.5)
WBC: 9 10*3/uL (ref 4.0–10.5)
nRBC: 0 % (ref 0.0–0.2)

## 2021-06-28 LAB — BASIC METABOLIC PANEL
Anion gap: 11 (ref 5–15)
BUN: 26 mg/dL — ABNORMAL HIGH (ref 8–23)
CO2: 30 mmol/L (ref 22–32)
Calcium: 8.3 mg/dL — ABNORMAL LOW (ref 8.9–10.3)
Chloride: 90 mmol/L — ABNORMAL LOW (ref 98–111)
Creatinine, Ser: 2.19 mg/dL — ABNORMAL HIGH (ref 0.61–1.24)
GFR, Estimated: 30 mL/min — ABNORMAL LOW (ref >60)
Glucose, Bld: 141 mg/dL — ABNORMAL HIGH (ref 70–99)
Potassium: 3.7 mmol/L (ref 3.5–5.1)
Sodium: 131 mmol/L — ABNORMAL LOW (ref 135–145)

## 2021-06-28 LAB — GLUCOSE, CAPILLARY
Glucose-Capillary: 137 mg/dL — ABNORMAL HIGH (ref 70–99)
Glucose-Capillary: 237 mg/dL — ABNORMAL HIGH (ref 70–99)
Glucose-Capillary: 159 mg/dL — ABNORMAL HIGH (ref 70–99)
Glucose-Capillary: 183 mg/dL — ABNORMAL HIGH (ref 70–99)

## 2021-06-28 LAB — BRAIN NATRIURETIC PEPTIDE: B Natriuretic Peptide: 1390.9 pg/mL — ABNORMAL HIGH (ref 0.0–100.0)

## 2021-06-28 MED ORDER — TORSEMIDE 20 MG PO TABS
20.0000 mg | ORAL_TABLET | Freq: Every day | ORAL | Status: DC
  Administered 2021-06-29: 20 mg via ORAL
  Filled 2021-06-28: qty 1

## 2021-06-28 MED ORDER — EMPAGLIFLOZIN 10 MG PO TABS
10.0000 mg | ORAL_TABLET | Freq: Every day | ORAL | Status: DC
  Administered 2021-06-29: 10 mg via ORAL
  Filled 2021-06-28: qty 1

## 2021-06-28 MED ORDER — GUAIFENESIN-DM 100-10 MG/5ML PO SYRP
5.0000 mL | ORAL_SOLUTION | ORAL | Status: DC | PRN
  Administered 2021-06-29 (×2): 5 mL via ORAL
  Filled 2021-06-28 (×3): qty 5

## 2021-06-28 MED FILL — Norepinephrine-NaCl IV Solution 4 MG/250ML-0.9%: INTRAVENOUS | Qty: 250 | Status: AC

## 2021-06-28 NOTE — Progress Notes (Signed)
CARDIAC REHAB PHASE I  ? ?PRE:  Rate/Rhythm: 72 SR ? ?  BP: sitting 105/53 ? ?  SaO2: 98 2L ? ?MODE:  Ambulation: 210 ft  ? ?POST:  Rate/Rhythm: 76 SR ? ?  BP: sitting 122/53  ? ?  SaO2: 90 RA, 97 RA with rest ? ?Pt stood and walked independently with RW. Slow and steady. C/o some SOB but main c/o was "knee pain" from mid left thigh to below knee. Rest x2. saO2 borderline in hall. Pain relieved with sitting and resting. To recliner. ? ?Discussed with pt and wife (wife sts pt has dementia and cannot remember welll) Brilinta, daily wts, low sodium, signs of fluid, NTG, and CRPII. Pt and wife receptive. Gave HF booklet.  ? [phone] ? ?[place], ACSM ?06/28/2021 ?3:25 PM ? ? ? ? ?

## 2021-06-28 NOTE — TOC Benefit Eligibility Note (Addendum)
Patient Advocate Encounter ? ?Insurance verification completed.   ? ?The patient is currently admitted and upon discharge could be taking Farxiga 10 mg. ? ?The current 30 day co-pay is, $64.00.  ? ?The patient is currently admitted and upon discharge could be taking isosorbide-hydralazine (Bidil) 20-37.5 mg. ? ?The current 30 day co-pay is, $10.00.  ? ?The patient is currently admitted and upon discharge could be taking Jardiance 10 mg. ? ?The current 30 day co-pay is, $40.00.  ? ?The patient is insured through [employer] Part D  ? ? ? ?Lyndel Safe, CPhT ?Pharmacy Patient Advocate Specialist ?[facility] Patient Advocate Team ?Direct Number: [phone]  Fax: [phone] ? ? ? ? ? ?  ?

## 2021-06-28 NOTE — Progress Notes (Signed)
Physical Therapy Treatment ?Patient Details ?Name: Zachary [name] ?MRN: [id number] ?DOB: 06/09/1945 ?Today's Date: 06/28/2021 ? ? ?History of Present Illness 76 y/o male adm 3/10 with SOB, chest pain and BLE swelling. Found to have CHF exacerbation, s/p right thoracentesis 3/12 and 3/15. R/L radial Heart Cath 3/14 with ICM & EF 26%, 3/17 fem access coronary angioplasty. PMHx: DM, HTN, PVD, left great toe amp, recent admit 04/2021 and d/ced from rehab (only home 2 days before being readmitted). ? ?  ?PT Comments  ? ? Pt pleasant and eager to return home. Pt with sats 90-95% during session on RA with pt able to perform hall ambulation with RW and educated for HEP. Pt reports wife assist at home and required assist to don socks EOB. Will continue to follow and encouraged daily ambulation and OOB acutely.  ?HR 68 ?   ?Recommendations for follow up therapy are one component of a multi-disciplinary discharge planning process, led by the attending physician.  Recommendations may be updated based on patient status, additional functional criteria and insurance authorization. ? ?Follow Up Recommendations ? Home health PT ?  ?  ?Assistance Recommended at Discharge Intermittent Supervision/Assistance  ?Patient can return home with the following A little help with walking and/or transfers;A little help with bathing/dressing/bathroom;Assistance with cooking/housework;Help with stairs or ramp for entrance;Assist for transportation ?  ?Equipment Recommendations ? None recommended by PT  ?  ?Recommendations for Other Services   ? ? ?  ?Precautions / Restrictions Precautions ?Precautions: Fall;Other (comment) ?Precaution Comments: watch 02  ?  ? ?Mobility ? Bed Mobility ?Overal bed mobility: Modified Independent ?  ?  ?  ?  ?  ?  ?  ?  ? ?Transfers ?Overall transfer level: Needs assistance ?  ?Transfers: Sit to/from Stand ?Sit to Stand: Min guard ?  ?  ?  ?  ?  ?General transfer comment: guarding with cues for hand placement ?   ? ?Ambulation/Gait ?Ambulation/Gait assistance: Min guard ?Gait Distance (Feet): 140 Feet ?Assistive device: Rolling walker (2 wheels) ?Gait Pattern/deviations: Step-through pattern, Decreased stride length ?  ?Gait velocity interpretation: 1.31 - 2.62 ft/sec, indicative of limited community ambulator ?  ?General Gait Details: pt with slow steady gait with preference for RW this session. SpO2 90-94% on RA with gait with need to lift hand off RW for accurate pleth reading. Pt able to self limit gait distance ? ? ?Stairs ?  ?  ?  ?  ?  ? ? ?Wheelchair Mobility ?  ? ?Modified Rankin (Stroke Patients Only) ?  ? ? ?  ?Balance Overall balance assessment: Needs assistance ?  ?Sitting balance-Leahy Scale: Good ?Sitting balance - Comments: EOb without assist ?  ?Standing balance support: During functional activity, Reliant on assistive device for balance ?Standing balance-Leahy Scale: Poor ?Standing balance comment: Rw for gait ?  ?  ?  ?  ?  ?  ?  ?  ?  ?  ?  ?  ? ?  ?Cognition Arousal/Alertness: Awake/alert ?Behavior During Therapy: [facility] for tasks assessed/performed ?Overall Cognitive Status: Within Functional Limits for tasks assessed ?  ?  ?  ?  ?  ?  ?  ?  ?  ?  ?  ?  ?  ?  ?  ?  ?  ?  ?  ? ?  ?Exercises General Exercises - Lower Extremity ?Long Arc Quad: AROM, Both, Seated, 15 reps ?Hip Flexion/Marching: AROM, Both, Seated, 15 reps ? ?  ?General Comments   ?  ?  ? ?  Pertinent Vitals/Pain Pain Assessment ?Pain Score: 2  ?Pain Location: left groin and knee ?Pain Descriptors / Indicators: Guarding, Aching ?Pain Intervention(s): Limited activity within patient's tolerance, Monitored during session, Repositioned  ? ? ?Home Living   ?  ?  ?  ?  ?  ?  ?  ?  ?  ?   ?  ?Prior Function    ?  ?  ?   ? ?PT Goals (current goals can now be found in the care plan section) Progress towards PT goals: Progressing toward goals ? ?  ?Frequency ? ? ? Min 3X/week ? ? ? ?  ?PT Plan Current plan remains appropriate  ? ? ?Co-evaluation   ?  ?   ?  ?  ? ?  ?AM-PAC PT "6 Clicks" Mobility   ?Outcome Measure ? Help needed turning from your back to your side while in a flat bed without using bedrails?: None ?Help needed moving from lying on your back to sitting on the side of a flat bed without using bedrails?: None ?Help needed moving to and from a bed to a chair (including a wheelchair)?: A Little ?Help needed standing up from a chair using your arms (e.g., wheelchair or bedside chair)?: A Little ?Help needed to walk in hospital room?: A Little ?Help needed climbing 3-5 steps with a railing? : A Little ?6 Click Score: 20 ? ?  ?End of Session Equipment Utilized During Treatment: Gait belt;Oxygen ?Activity Tolerance: Patient tolerated treatment well ?Patient left: with call bell/phone within reach;in chair ?Nurse Communication: Mobility status ?PT Visit Diagnosis: Other abnormalities of gait and mobility (R26.89);Difficulty in walking, not elsewhere classified (R26.2) ?  ? ? ?Time: 0812-0829 ?PT Time Calculation (min) (ACUTE ONLY): 17 min ? ?Charges:  $Gait Training: 8-22 mins          ?          ? ?Ladislav Caselli P, PT ?Acute Rehabilitation Services ?Pager: [phone] ?Office: [phone] ? ? ? ?Maddon Horton B Zakaria Sedor ?06/28/2021, 9:14 AM ? ?

## 2021-06-28 NOTE — Progress Notes (Signed)
Subjective:  ?Breathing improving ? ?S/p Rt thoracentesis 3/12 ?S/p cardiac MRI 3/13 ?S/p cardiac catheterization 3/14 ?S/p Lt thoracentesis 3/15 ?S/p RCA PCI 3/17 ? ?Objective:  ?Vital Signs in the last 24 hours: ?Temp:  [96.9 ?F (36.1 ?C)-97.8 ?F (36.6 ?C)] 97.8 ?F (36.6 ?C) (03/20 0447) ?Pulse Rate:  [68-72] 68 (03/19 1945) ?Resp:  [15-26] 18 (03/19 1945) ?BP: (97-114)/(48-78) 111/78 (03/19 1945) ?SpO2:  [90 %-100 %] 100 % (03/19 1945) ?Weight:  [78.4 kg] 78.4 kg (03/20 0447) ? ?Intake/Output from previous day: ?03/19 0701 - 03/20 0700 ?In: 200 [P.O.:200] ?Out: 2125 [Urine:2125] ? ?Physical Exam ?Vitals and nursing note reviewed.  ?Constitutional:   ?   General: He is not in acute distress. ?Neck:  ?   Vascular: JVD present.  ?Cardiovascular:  ?   Rate and Rhythm: Normal rate and regular rhythm.  ?   Heart sounds: Normal heart sounds. No murmur heard. ?Pulmonary:  ?   Effort: Pulmonary effort is normal.  ?   Breath sounds: Examination of the right-lower field reveals decreased breath sounds. Examination of the left-lower field reveals decreased breath sounds. Decreased breath sounds present. No wheezing or rales.  ?Musculoskeletal:  ?   Right lower leg: Edema (1+) present.  ?   Left lower leg: Edema (1+) present.  ? ? ?Cardiac Studies: ? ?Reviewed and interpreted: ?BMP, BNP, trop HS ?  ?Imaging/tests reviewed and independently interpreted: ?  ?Cardiac Studies: ?  ?Telemetry 06/28/2021: ?Occasional PVC/NSVT <3 beats ? ?EKG 06/28/2021: ?Sinus rhythm 73 bpm.  Low voltage.  Old anterolateral infarct.  Poor R wave progression. ?  ?RCA intervention 06/25/2021: ?Severe prox-ostial calcific 80-95% stenoses ?Diffuse disease through the vessel, at least 50% ?Mid focal 80% stenosis ?   ?Targeted coronary intervention to ostial-prox RCA ?Temporary transvenous pacemaker placement ?Intravascular ultrasound (IVUS) ?Orbital atherectomy Diamondback 360 classic crown ?Successful percutaneous coronary intervention ostial-proc RCA ?        PTCA and stent placement 3.5 X 18 mm Onyx drug-eluting stent ?       Post dilatation with 3.5X15 mm [place] balloon at 22 atm ?       0% residual stenosis at the site of the stenosis ?       TIMI flow I-->II ?  ?Echocardiogram 06/19/2021: ? 1. Left ventricular ejection fraction, by estimation, is 25 to 30%. The  ?left ventricle has severely decreased function. The left ventricle  ?demonstrates regional wall motion abnormalities (see scoring  ?diagram/findings for description). Left ventricular  ?diastolic function could not be evaluated. There is severe hypokinesis of  ?the left ventricular, mid-apical anteroseptal wall, anterior wall and  ?apical segment.  ? 2. Right ventricular systolic function is mildly reduced. The right  ?ventricular size is normal. There is normal pulmonary artery systolic  ?pressure.  ? 3. Left atrial size was mildly dilated.  ? 4. Right atrial size was mildly dilated.  ? 5. The mitral valve is abnormal. Moderate mitral valve regurgitation.  ?Moderate mitral stenosis. Moderate mitral annular calcification. Mean PG 9  ?mmHg, MVA 1.34 cm2 by PHT method, likely more prominent due to resting  ?tachycardia around 104 bpm.  ? 6. Tricuspid valve regurgitation is mild to moderate.  ? 7. The aortic valve is tricuspid. There is moderate calcification of the  ?aortic valve. Aortic valve regurgitation is moderate. Aortic valve area,  ?by VTI measures 0.87 cm?Marland Kitchen Aortic valve mean gradient measures 22.0 mmHg.  ?Aortic valve Vmax measures 3.14  ?m/s. Dimensionless index 0.29.  ? 8. Moderate pleural effusion.  ?  9. Compared to previous transthoracic and transesophageal studies in  ?04/2021, LVEF is reduced from 40-45%. Regional wall motion abnormalities  ?(LAD territory) are more pronounced Mild RV dysfunctinon is new. Valvular  ?findings are relatively unchanged.  ?  ?Echocardiogram 04/26/2021: ?1. Left ventricular ejection fraction, by estimation, is 40 to 45%. The  ?left ventricle has mildly decreased  function. The left ventricle  ?demonstrates regional wall motion abnormalities (see scoring  ?diagram/findings for description). Left ventricular  ?diastolic parameters are indeterminate.  ? 2. Right ventricular systolic function is normal. The right ventricular  ?size is normal. Tricuspid regurgitation signal is inadequate for assessing  ?PA pressure.  ? 3. Left atrial size was mildly dilated.  ? 4. Right atrial size was mildly dilated.  ? 5. The mitral valve is degenerative. No evidence of mitral valve  ?regurgitation. Mild to moderate mitral stenosis. The mean mitral valve  ?gradient is 6.0 mmHg with average heart rate of 88 bpm. Moderate to severe  ?mitral annular calcification.  ? 6. The aortic valve is tricuspid. Aortic valve regurgitation is not  ?visualized. Moderate to severe aortic valve stenosis. Aortic valve mean  ?gradient measures 28.0 mmHg. Aortic valve Vmax measures 3.82 m/s.  ? ?Conclusion(s)/Recommendation(s): No hemodynamically significant valvular  ?heart disease.  ?  ?Assessment & Recommendations: ?  ?76 y.o. Caucasian male  with hypertension, uncontrolled type 2 diabetes mellitus, peripheral artery disease, h/o osteomyelitis s/p right foot toe amputation, nonhealing wound left foot toe, h/o epidural abscess and MSSA bacteremia 04/2021, critical limb ischemia, moderate mitral and aortic stenosis, now admitted with congestive heart failure. ?  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  Not a candidate for CABG due to poor targets.  ?Underwent complex prox RCA PCI on 06/25/2021. ?1+ leg edema remains in spite of net negative 9.5 L. ?Cr up to 2.19. cardiorenal syndrome ?Reduce PO torsemide to 20 mg daily.  ?Continue bisoprolol  2.5 mg, given low normal blood pressures. ?Continue BiDil half tablet 3 times daily for afterload reduction. ?Unable to use ARN I/ARB/MRA/SGLT2 i due to renal dysfunction. ?May consider adding in the future ?Continue strict I/O. ? ?CAD: ?Severe multivessel coronary artery disease. ?Not a surgical candidate due to poor targets. ?Underwent complex prox RCA PCI on 06/25/2021. ?Recommend DAPT with Aspirin/Brilinta at least till 12/2021. ?Continue statin, bisoprolol 2.5 mg ? ?Given renal dysfunction, no immediate plans for Lcx PCI. Will consider outpatient. ? ?Hyponatremia: ?Likely dilutional in the setting of decompensated heart failure. ?Continue heart failure management. ?  ?PAD: ?Absent pedal pulses on exam. ?Follows up with Dr. [name] with vascular interventional radiology. ?Plan for repeat angiography per Dr. [name] prior to anticipated amputation of left foot toes ?This may have to wait until after management of acute cardiac issues at this time. ?   ?Discussed with primary team. ? ? ? ?Nigel Mormon, MD ?Pager: [phone] ?Office: [phone] ? ?

## 2021-06-28 NOTE — Progress Notes (Addendum)
?PROGRESS NOTE ? ? ? ?Zachary [name]  XKG:[id number] DOB: Aug 23, 1945 DOA: 06/18/2021 ?PCP: [name], FNP  ?Narrative 75/F with history of uncontrolled type 2 diabetes mellitus, hypertension, dyslipidemia, peripheral vascular disease, recent complicated hospitalization hospitalization with osteomyelitis, MSSA bacteremia and epidural abscess, status post right great toe amputation 1/10 ,TEE negative for endocarditis, completed prolonged course of IV cefazolin and oral ciprofloxacin on 3/6. ?-Presented to the ED with progressive dyspnea on exertion ?-Diagnosed with acute systolic CHF, EF down to 70-26%, diuresed with IV Lasix ?-Cardiology following, underwent left and right heart cath 3/14, noted to have severe multivessel CAD, T CTS consulted, Plavix discontinued ?-Not a candidate for CABG: No targets ?-Underwent high risk PCI of proximal RCA 3/17 ? ? ?Subjective: ?-Feels tired, short of breath with activity, denies any chest pain ? ?Assessment and Plan: ? ?* Acute systolic CHF (congestive heart failure) ([place]) ?Acute hypoxic respiratory failure ?Pleural effusions ?-ECHO showed an EF of 25 to 30%  ?-s/p R Thoracentesis  3/12-> 2.4 Liters of Amber Fluid, fluid transudative, cultures negative ?-L Thora 3/15: 1.2L drained ?-Cardiac MRI-"Severely Reduced LV function with regional wall motion abnormalities. Apex does not appear viable, other LV segments appear viable. ?-LHC 3/14 with severe multivessel CAD ?-Diuresed well with IV Lasix he is 9.5 L negative, creatinine bumped to 2.1 ?-Hold Lasix/torsemide today, avoid hypotension, decreased BiDil dose ?-Continue bisoprolol, Corlanor,  jardiance ?-Ambulate, PT eval, BMP in a.m. ? ?Multivessel CAD ?-Continue aspirin, Brilinta, bisoprolol, statin ?-Appreciate TCTS consult, not a candidate for CABG ?--Underwent high risk PCI and stenting of proximal RCA 3/17 ? ?AKI (acute kidney injury) ([place]), CKD 3a ?-Baseline creatinine around 1.3-1.4, now in the 1.6-1.7  range ?-Likely hemodynamically mediated, now stable ? 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  plans for transmetatarsal amputation of the left foot in April with Podiatry. ?-Had MSSA bacteremia with epidural abscess during recent hospitalization in January, TEE was negative for endocarditis ?-Seen by infectious disease, initially treated with cefepime followed by cefazolin and oral ciprofloxacin, completed antibiotic course 3/6 ? ?Peripheral vascular disease ([place]) ?-Continued Aspirin but held Plavix ?-Cards consulted and he has absent pedal pulses and follows with Dr. [name] of Podiatry and plan is for Surgical Intervention on the 4th Toe in April with a Transmetatarsal Amputation of the Left Foot in April   ?-Discussed with Dr. [name] 3/16, plan for follow-up in few weeks in IR clinic and evaluate for angiogram  ? ?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 (HCC) ?-CBG improving continue Semglee, meal coverage, continue sliding scale insulin,  ? ?Essential hypertension ?-Continue bisoprolol ? ?DVT prophylaxis: Heparin subcutaneous ?Code Status: Full code ?Family Communication: Discussed with patient in detail, no family at bedside ?Disposition Plan: Likely 48 hours ? ?Consultants:  ?Cardiology, T CTS ? ?Procedures:  ?- R Thoracentesis  3/12-> 2.4 Liters of Amber Fluid, fluid transudative, cultures negative ?-L Thora 3/15: 1.2L drained ?-LHC 3/14 with severe multivessel CAD ?LHC 3/17:  Targeted coronary intervention to ostial-prox RCA ?Temporary transvenous pacemaker placement ?Intravascular ultrasound (IVUS) ?Orbital atherectomy Diamondback 360 classic crown ?Successful percutaneous coronary intervention ostial-proc RCA ?PTCA and stent placement 3.5 X 18 mm Onyx drug-eluting stent ?Post dilatation with 3.5X15 mm Hamilton balloon at 22  atm ? ? ?Antimicrobials:  ? ? ?Objective: ?Vitals:  ? 06/27/21 1945 06/28/21 0447 06/28/21 3785 06/28/21 0841  ?BP: 111/78  108/64   ?Pulse:  68  70 76  ?Resp: 18  18   ?Temp: (!) 97.3 ?F (36.3 ?C) 97.8 ?F (36.6 ?C) 97.8 ?F (36.6 ?C)   ?TempSrc: Axillary Oral Oral   ?SpO2: 100%  97% 93%  ?Weight:  78.4 kg    ?Height:      ? ? ?Intake/Output Summary (Last 24 hours) at 06/28/2021 1350 ?Last data filed at 06/28/2021 0457 ?Gross per 24 hour  ?Intake 200 ml  ?Output 1900 ml  ?Net -1700 ml  ? ?Filed Weights  ? 06/25/21 0549 06/27/21 0500 06/28/21 0447  ?Weight: 81.6 kg 79.9 kg 78.4 kg  ? ? ?Examination: ? ?General exam: Pleasant male sitting up in bed, AAOx3, no distress ?HEENT: Positive JVD ?CVS: S1-S2, regular rate rhythm ?Lungs: Decreased breath sounds at the bases ?Abdomen: Soft, nontender, bowel sounds present   ?Extremities : Right great toe wound, healing well, status post amputation with dressing ?Skin: As above ?Psychiatry:  Mood & affect appropriate.  ? ? ? ?Data Reviewed:  ? ?CBC: ?Recent Labs  ?Lab 06/22/21 ?0157 06/22/21 ?8832 06/22/21 ?5498 06/23/21 ?0249 06/24/21 ?0249 06/26/21 ?2641 06/28/21 ?0115  ?WBC 8.6  --   --  9.7 8.0 7.5 9.0  ?NEUTROABS 6.8  --   --  7.6  --   --   --   ?HGB 13.9   < > 12.2* 12.2* 11.3* 11.2* 12.0*  ?HCT 42.0   < > 36.0* 37.7* 34.6* 33.0* 35.5*  ?MCV 86.6  --   --  86.1 85.6 83.8 85.1  ?PLT 221  --   --  230 210 206 237  ? < > = values in this interval not displayed.  ? ?Basic Metabolic Panel: ?Recent Labs  ?Lab 06/22/21 ?0157 06/22/21 ?5830 06/23/21 ?0249 06/24/21 ?0249 06/25/21 ?9407 06/25/21 ?6808 06/26/21 ?8110 06/27/21 ?3159 06/28/21 ?0115  ?NA 134*   < > 134*   < > 129* 130* 130* 130* 131*  ?K 4.0   < > 4.1   < > 3.7 3.8 3.8 3.8 3.7  ?CL 90*  --  90*   < > 88* 90* 91* 90* 90*  ?CO2 33*  --  32   < > $R'31 29 29 29 30  'Ok$ ?GLUCOSE 187*  --  182*   < > 206* 201* 206* 153* 141*  ?BUN 29*  --  32*   < > 34* 31* 30* 29* 26*  ?CREATININE 1.76*  --  1.63*   < > 1.82* 1.78* 1.76* 1.96* 2.19*   ?CALCIUM 9.0  --  8.6*   < > 8.2* 8.3* 8.2* 8.4* 8.3*  ?MG 2.1  --  2.0  --   --   --   --   --   --   ?PHOS 4.3  --  4.4  --   --   --   --   --   --   ? < > = values in this interval not displayed.  ? ?GFR: ?Estimated Creatinine Clearance: 27.3 mL/min (A) (by C-G formula based on SCr of 2.19 mg/dL (H)). ?Liver Function Tests: ?Recent Labs  ?Lab 06/22/21 ?0157 06/23/21 ?4585 06/24/21 ?9292  ?AST 16 15 13*  ?ALT $Rem'9 10 8  'uFrH$ ?ALKPHOS 53 50 44  ?BILITOT 0.8 0.6 0.7  ?PROT 6.2* 6.1* 5.7*  ?ALBUMIN 2.8* 2.8* 2.6*  ? ?No results for input(s): LIPASE, AMYLASE in the last 168 hours. ?No results for input(s): AMMONIA in the last 168 hours. ?Coagulation Profile: ?No results for input(s): INR, PROTIME in the last  168 hours. ?Cardiac Enzymes: ?No results for input(s): CKTOTAL, CKMB, CKMBINDEX, TROPONINI in the last 168 hours. ?BNP (last 3 results) ?No results for input(s): PROBNP in the last 8760 hours. ?HbA1C: ?No results for input(s): HGBA1C in the last 72 hours. ?CBG: ?Recent Labs  ?Lab 06/27/21 ?1206 06/27/21 ?1548 06/27/21 ?2109 06/28/21 ?8592 06/28/21 ?1156  ?GLUCAP 130* 155* 157* 137* 237*  ? ?Lipid Profile: ?No results for input(s): CHOL, HDL, LDLCALC, TRIG, CHOLHDL, LDLDIRECT in the last 72 hours. ?Thyroid Function Tests: ?No results for input(s): TSH, T4TOTAL, FREET4, T3FREE, THYROIDAB in the last 72 hours. ?Anemia Panel: ?No results for input(s): VITAMINB12, FOLATE, FERRITIN, TIBC, IRON, RETICCTPCT in the last 72 hours. ?Urine analysis: ?   ?Component Value Date/Time  ? COLORURINE YELLOW 04/18/2021 1635  ? APPEARANCEUR CLOUDY (A) 04/18/2021 1635  ? LABSPEC 1.025 04/18/2021 1635  ? PHURINE 5.5 04/18/2021 1635  ? GLUCOSEU >=500 (A) 04/18/2021 1635  ? HGBUR LARGE (A) 04/18/2021 1635  ? BILIRUBINUR NEGATIVE 04/18/2021 1635  ? [place] NEGATIVE 04/18/2021 1635  ? PROTEINUR 100 (A) 04/18/2021 1635  ? NITRITE NEGATIVE 04/18/2021 1635  ? LEUKOCYTESUR SMALL (A) 04/18/2021 1635  ? ?Sepsis  Labs: ?$Remov'@LABRCNTIP'ZeiAsa$ (procalcitonin:4,lacticidven:4) ? ?) ?Recent Results (from the past 240 hour(s))  ?Resp Panel by RT-PCR (Flu A&B, Covid) Nasopharyngeal Swab     Status: None  ? Collection Time: 06/18/21  3:17 PM  ? Specimen: Nasopharyngeal Swab; Nasopharyngeal(NP) swab

## 2021-06-29 ENCOUNTER — Telehealth: Payer: Self-pay | Admitting: Cardiology

## 2021-06-29 ENCOUNTER — Other Ambulatory Visit (HOSPITAL_COMMUNITY): Payer: Self-pay

## 2021-06-29 ENCOUNTER — Other Ambulatory Visit: Payer: Self-pay | Admitting: Cardiology

## 2021-06-29 DIAGNOSIS — I5021 Acute systolic (congestive) heart failure: Secondary | ICD-10-CM | POA: Diagnosis not present

## 2021-06-29 DIAGNOSIS — I5020 Unspecified systolic (congestive) heart failure: Secondary | ICD-10-CM

## 2021-06-29 LAB — CBC
HCT: 38.3 % — ABNORMAL LOW (ref 39.0–52.0)
Hemoglobin: 12.6 g/dL — ABNORMAL LOW (ref 13.0–17.0)
MCH: 28 pg (ref 26.0–34.0)
MCHC: 32.9 g/dL (ref 30.0–36.0)
MCV: 85.1 fL (ref 80.0–100.0)
Platelets: 282 10*3/uL (ref 150–400)
RBC: 4.5 MIL/uL (ref 4.22–5.81)
RDW: 13.2 % (ref 11.5–15.5)
WBC: 8.5 10*3/uL (ref 4.0–10.5)
nRBC: 0 % (ref 0.0–0.2)

## 2021-06-29 LAB — BASIC METABOLIC PANEL
Anion gap: 12 (ref 5–15)
BUN: 28 mg/dL — ABNORMAL HIGH (ref 8–23)
CO2: 29 mmol/L (ref 22–32)
Calcium: 8.6 mg/dL — ABNORMAL LOW (ref 8.9–10.3)
Chloride: 92 mmol/L — ABNORMAL LOW (ref 98–111)
Creatinine, Ser: 2.13 mg/dL — ABNORMAL HIGH (ref 0.61–1.24)
GFR, Estimated: 31 mL/min — ABNORMAL LOW (ref >60)
Glucose, Bld: 157 mg/dL — ABNORMAL HIGH (ref 70–99)
Potassium: 3.6 mmol/L (ref 3.5–5.1)
Sodium: 133 mmol/L — ABNORMAL LOW (ref 135–145)

## 2021-06-29 LAB — GLUCOSE, CAPILLARY: Glucose-Capillary: 145 mg/dL — ABNORMAL HIGH (ref 70–99)

## 2021-06-29 MED ORDER — LANTUS SOLOSTAR 100 UNIT/ML ~~LOC~~ SOPN: 16.0000 [IU] | PEN_INJECTOR | Freq: Every day | SUBCUTANEOUS | Status: AC

## 2021-06-29 MED ORDER — BISOPROLOL FUMARATE 5 MG PO TABS
2.5000 mg | ORAL_TABLET | Freq: Every day | ORAL | 0 refills | Status: AC
Start: 2021-06-30 — End: ?
  Filled 2021-06-29: qty 30, 60d supply, fill #0

## 2021-06-29 MED ORDER — ROSUVASTATIN CALCIUM 20 MG PO TABS
20.0000 mg | ORAL_TABLET | Freq: Every day | ORAL | 0 refills | Status: DC
  Filled 2021-06-29: qty 30, 30d supply, fill #0

## 2021-06-29 MED ORDER — ISOSORB DINITRATE-HYDRALAZINE 20-37.5 MG PO TABS
0.5000 | ORAL_TABLET | Freq: Three times a day (TID) | ORAL | 0 refills | Status: AC
  Filled 2021-06-29: qty 45, 30d supply, fill #0

## 2021-06-29 MED ORDER — POTASSIUM CHLORIDE ER 20 MEQ PO TBCR: 20.0000 meq | EXTENDED_RELEASE_TABLET | Freq: Every day | ORAL | Status: DC

## 2021-06-29 MED ORDER — TORSEMIDE 20 MG PO TABS
20.0000 mg | ORAL_TABLET | Freq: Every day | ORAL | 0 refills | Status: DC
  Filled 2021-06-29: qty 30, 30d supply, fill #0

## 2021-06-29 MED ORDER — TICAGRELOR 90 MG PO TABS
90.0000 mg | ORAL_TABLET | Freq: Two times a day (BID) | ORAL | 0 refills | Status: AC
  Filled 2021-06-29: qty 60, 30d supply, fill #0

## 2021-06-29 MED ORDER — EMPAGLIFLOZIN 10 MG PO TABS
10.0000 mg | ORAL_TABLET | Freq: Every day | ORAL | 0 refills | Status: AC
  Filled 2021-06-29: qty 30, 30d supply, fill #0

## 2021-06-29 NOTE — Progress Notes (Signed)
CARDIAC REHAB PHASE I  ? ?PRE:  Rate/Rhythm: 76 SR ? ?  BP: sitting 118/54 ? ?  SaO2: 96 RA ? ?MODE:  Ambulation: 290 ft  ? ?POST:  Rate/Rhythm: 84 SR ? ?  BP: sitting 121/54  ? ?  SaO2: 93 RA ? ?Pt feels well. Got out of bed independently and walked with standby assist. Rest x2 for SOB, SaO2 91 RA in hall. Did not have knee pain today. Increased distance. Walked around bed in room without RW however slight LOB getting around his RW. Held to furniture.  ?[phone]  ? ?[place], ACSM ?06/29/2021 ?8:31 AM ? ? ? ? ?

## 2021-06-29 NOTE — Care Management Important Message (Signed)
Important Message ? ?Patient Details  ?Name: Zachary [name] ?MRN: [id number] ?Date of Birth: 11/24/1945 ? ? ?Medicare Important Message Given:  Yes ? ? ? ? ?[name] ?06/29/2021, 8:19 AM ?

## 2021-06-29 NOTE — Progress Notes (Signed)
Occupational Therapy Treatment ?Patient Details ?Name: Zachary [name] ?MRN: [id number] ?DOB: 07-27-1945 ?Today's Date: 06/29/2021 ? ? ?History of present illness 76 y/o male adm 3/10 with SOB, chest pain and BLE swelling. Found to have CHF exacerbation, s/p right thoracentesis 3/12 and 3/15. R/L radial Heart Cath 3/14 with ICM & EF 26%, 3/17 fem access coronary angioplasty. PMHx: DM, HTN, PVD, left great toe amp, recent admit 04/2021 and d/ced from rehab (only home 2 days before being readmitted). ?  ?OT comments ? Patient received in recliner following cardiac rehab.  Patient was able to tolerate 2 minutes of standing performing grooming and UB bathing.  Static standing from recliner patient was able to tolerate 3 minutes of standing before requiring seated rest break. Patient led in UE HEP exercises to increase activity tolerance. Acute OT to continue to follow.   ? ?Recommendations for follow up therapy are one component of a multi-disciplinary discharge planning process, led by the attending physician.  Recommendations may be updated based on patient status, additional functional criteria and insurance authorization. ?   ?Follow Up Recommendations ? Home health OT  ?  ?Assistance Recommended at Discharge Intermittent Supervision/Assistance  ?Patient can return home with the following ? A little help with bathing/dressing/bathroom;Assistance with cooking/housework;Assist for transportation;Help with stairs or ramp for entrance ?  ?Equipment Recommendations ? None recommended by OT  ?  ?Recommendations for Other Services   ? ?  ?Precautions / Restrictions Precautions ?Precautions: Fall;Other (comment) ?Precaution Comments: watch 02 ?Restrictions ?Weight Bearing Restrictions: No  ? ? ?  ? ?Mobility Bed Mobility ?Overal bed mobility: Modified Independent ?  ?  ?  ?  ?  ?  ?General bed mobility comments: seated in recliner and remained in recliner at end of session ?  ? ?Transfers ?Overall transfer level: Needs  assistance ?Equipment used: Rolling walker (2 wheels) ?Transfers: Sit to/from Stand ?Sit to Stand: Min guard ?  ?  ?  ?  ?  ?General transfer comment: min guard for safety ?  ?  ?Balance Overall balance assessment: Needs assistance ?Sitting-balance support: Feet supported, No upper extremity supported ?Sitting balance-Leahy Scale: Good ?  ?  ?Standing balance support: During functional activity, Reliant on assistive device for balance ?Standing balance-Leahy Scale: Poor ?Standing balance comment: stood at sink and performed grooming and UB bathing ?  ?  ?  ?  ?  ?  ?  ?  ?  ?  ?  ?   ? ?ADL either performed or assessed with clinical judgement  ? ?ADL Overall ADL's : Needs assistance/impaired ?  ?  ?Grooming: Wash/dry hands;Wash/dry face;Supervision/safety;Standing ?Grooming Details (indicate cue type and reason): tolerated 2 minutes of standing at sink ?Upper Body Bathing: Set up;Standing ?  ?  ?  ?  ?  ?  ?  ?Toilet Transfer: Min guard;Ambulation;Rolling walker (2 wheels) ?Toilet Transfer Details (indicate cue type and reason): simulated with chair to chair transfer ?  ?  ?  ?  ?  ?General ADL Comments: tolerated 2 minutes of standing at sink for grooming and UB bathing before requiring seated rest break ?  ? ?Extremity/Trunk Assessment   ?  ?  ?  ?  ?  ? ?Vision   ?  ?  ?Perception   ?  ?Praxis   ?  ? ?Cognition Arousal/Alertness: Awake/alert ?Behavior During Therapy: [facility] for tasks assessed/performed ?Overall Cognitive Status: Within Functional Limits for tasks assessed ?  ?  ?  ?  ?  ?  ?  ?  ?  ?  ?  ?  ?  ?  ?  ?  ?  General Comments: alert and oriented ?  ?  ?   ?Exercises Exercises: General Upper Extremity ?General Exercises - Upper Extremity ?Shoulder Flexion: Strengthening, Both, 10 reps, Seated, Theraband ?Theraband Level (Shoulder Flexion): Level 2 (Red) ?Elbow Flexion: Strengthening, Both, 10 reps, Seated, Theraband ?Theraband Level (Elbow Flexion): Level 2 (Red) ?Elbow Extension: Strengthening, Both, 10  reps, Seated, Theraband ?Theraband Level (Elbow Extension): Level 2 (Red) ? ?  ?Shoulder Instructions   ? ? ?  ?General Comments O2 90-95 during functional tasks  ? ? ?Pertinent Vitals/ Pain       Pain Assessment ?Pain Assessment: Faces ?Faces Pain Scale: Hurts a little bit ?Pain Location: left groin and knee ?Pain Descriptors / Indicators: Guarding, Aching ?Pain Intervention(s): Monitored during session, Repositioned ? ?Home Living   ?  ?  ?  ?  ?  ?  ?  ?  ?  ?  ?  ?  ?  ?  ?  ?  ?  ?  ? ?  ?Prior Functioning/Environment    ?  ?  ?  ?   ? ?Frequency ? Min 2X/week  ? ? ? ? ?  ?Progress Toward Goals ? ?OT Goals(current goals can now be found in the care plan section) ? Progress towards OT goals: Progressing toward goals ? ?Acute Rehab OT Goals ?Patient Stated Goal: go home ?OT Goal Formulation: With patient ?Time For Goal Achievement: 07/05/21 ?Potential to Achieve Goals: Fair ?ADL Goals ?Pt Will Perform Lower Body Bathing: with adaptive equipment;sitting/lateral leans;sit to/from stand;with set-up ?Pt Will Perform Lower Body Dressing: with set-up;with adaptive equipment;sitting/lateral leans;sit to/from stand ?Pt Will Transfer to Toilet: with supervision;ambulating ?Additional ADL Goal #1: Patient will demonstrate increased activity tolerance by completing functional standing task for 3-5 with one seated rest break.  ?Plan Discharge plan remains appropriate   ? ?Co-evaluation ? ? ?   ?  ?  ?  ?  ? ?  ?AM-PAC OT "6 Clicks" Daily Activity     ?Outcome Measure ? ? Help from another person eating meals?: A Little ?Help from another person taking care of personal grooming?: A Little ?Help from another person toileting, which includes using toliet, bedpan, or urinal?: A Little ?Help from another person bathing (including washing, rinsing, drying)?: A Little ?Help from another person to put on and taking off regular upper body clothing?: A Little ?Help from another person to put on and taking off regular lower body  clothing?: A Little ?6 Click Score: 18 ? ?  ?End of Session Equipment Utilized During Treatment: Rolling walker (2 wheels) ? ?OT Visit Diagnosis: Unsteadiness on feet (R26.81);Other abnormalities of gait and mobility (R26.89);Muscle weakness (generalized) (M62.81) ?  ?Activity Tolerance Patient tolerated treatment well ?  ?Patient Left in chair;with call bell/phone within reach ?  ?Nurse Communication Mobility status ?  ? ?   ? ?Time: 5697-9480 ?OT Time Calculation (min): 23 min ? ?Charges: OT General Charges ?$OT Visit: 1 Visit ?OT Treatments ?$Self Care/Home Management : 8-22 mins ?$Therapeutic Activity: 8-22 mins ? ?Lodema Hong, OTA ?Acute Rehabilitation Services  ?Pager [phone] ?Office [phone] ? ? ?[place] ?06/29/2021, 9:43 AM ?

## 2021-06-29 NOTE — Telephone Encounter (Signed)
Patient needs TOC, appointment w/MP on 07/07/21 at 8:30 AM, labs on 3/27 per MP. ?

## 2021-06-29 NOTE — Discharge Summary (Signed)
Physician Discharge Summary  ?ERLIN GARDELLA JHE:[id number] DOB: [birth year] DOA: 06/18/2021 ? ?PCP: [name], FNP ? ?Admit date: 06/18/2021 ?Discharge date: 06/29/2021 ? ?Time spent: 45 minutes ? ?Recommendations for Outpatient Follow-up:  ?Cardiology Dr. Virgina Jock on 3/29 and BMP on 3/27 ?Podiatry Dr. Posey Pronto in 1 to 2 weeks, needs transmetatarsal amputation left foot ?Infectious disease Dr. Candiss Norse in 2-3 weeks ?IR Dr. [name] in 2 weeks for Angiogram ? ? ?Discharge Diagnoses:  ?Principal Problem: ?  Acute systolic CHF (congestive heart failure) ([place]) ?Multivessel CAD ?  Acute respiratory failure with hypoxia ([place]) ?  AKI (acute kidney injury) ([place]) ?  Hyponatremia ?  Chronic osteomyelitis ([place]) ?  Essential hypertension ?  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  Peripheral vascular disease ([place]) ?  H/o MSSA bacteremia ?  H/o Epidural abscess ?  Mitral stenosis ?  Pleural effusion due to CHF (congestive heart failure) ([place]) ?  HFrEF (heart failure with reduced ejection fraction) ([place]) ?  Coronary artery disease of native artery of native heart with stable angina pectoris ([place]) ? ? ?Discharge Condition: stable ? ?Diet recommendation: diabetic, low sodium ? ?Filed Weights  ? 06/27/21 0500 06/28/21 0447 06/29/21 0405  ?Weight: 79.9 kg 78.4 kg 77.7 kg  ? ? 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 ?-Presented to the ED with progressive dyspnea on exertion ? ?Hospital Course:  ? ?Acute systolic CHF (congestive heart failure) ([place]) ?Acute hypoxic respiratory failure ?Pleural effusions ?-ECHO showed an EF of 25 to 30%  ?-s/p R  Thoracentesis  3/12-> 2.4 Liters of Amber Fluid, fluid transudative, cultures negative ?-L Thora 3/15: 1.2L drained ?-Cardiac MRI-"Severely Reduced LV function with regional wall motion abnormalities. Apex does not appear viable, other LV segments appear viable. ?-LHC 3/14 with severe multivessel CAD ?-Diuresed well with IV Lasix he is 10.5 L negative, creatinine slightly higher than baseline, now stable at 2.1 ?-Clinically improved, weaned off O2, discharged home bisoprolol, torsemide 20 Mg daily, Jardiance ?-Close follow-up with Dr. Virgina Jock on 3/29 with labs on 3/27 ?  ?Multivessel CAD ?-Continue aspirin, Brilinta, bisoprolol, statin ?-Appreciate TCTS consult, not a candidate for CABG ?--Underwent high risk PCI and stenting of proximal RCA 3/17 ?-Possible left circumflex PCI as outpatient ?  ?AKI (acute kidney injury) ([place]), CKD 3a ?-Baseline creatinine around 1.3-1.4, now in the 1.6-1.7 range ?-Likely hemodynamically mediated, now 2.1 ?-Repeat labs in 1 week ?  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  Also has chronic osteomyelitis of the left fourth distal phalanx.  plans for transmetatarsal amputation of the left foot in April with Podiatry Dr.Patel. ?-Had MSSA bacteremia with epidural abscess during recent hospitalization in January, TEE was negative for endocarditis ?-Seen by infectious disease, initially treated with cefepime followed by cefazolin and oral ciprofloxacin, completed antibiotic course 3/6 ?-Has an upcoming close appointment with Dr. Posey Pronto for TMT amputation ?  ?Peripheral vascular disease ([place]) ?-Continued Aspirin but held Plavix ?-Cards consulted and he has absent pedal pulses and follows with Dr. [name] of Podiatry and plan is for Surgical Intervention on the 4th Toe in April with a Transmetatarsal Amputation of the Left Foot in April   ?-Discussed with  Dr. [name] 3/16, plan for follow-up in  few weeks in IR clinic and evaluate for angiogram  ?  ?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 (HCC) ?-CBG improving continue Semglee, meal coverage, continue sliding scale insulin,  ?  ?Essential hypertension ?-Continue bisoprolol ? ?Consultants:  ?Cardiology, T CTS ?  ?Procedures:  ?- R Thoracentesis  3/12-> 2.4 Liters of Amber Fluid, fluid transudative, cultures negative ? ?-L Thora 3/15: 1.2L drained ? ?-LHC 3/14 with severe multivessel CAD ? ?LHC 3/17:  Targeted coronary intervention to ostial-prox RCA ?Temporary transvenous pacemaker placement ?Intravascular ultrasound (IVUS) ?Orbital atherectomy Diamondback 360 classic crown ?Successful percutaneous coronary intervention ostial-proc RCA ?PTCA and stent placement 3.5 X 18 mm Onyx drug-eluting stent ?Post dilatation with 3.5X15 mm Traverse balloon at 22 atm ? ?Discharge Exam: ?Vitals:  ? 06/29/21 0725 06/29/21 0952  ?BP: (!) 118/54 (!) 107/55  ?Pulse: 76 80  ?Resp: 19   ?Temp: 97.6 ?F (36.4 ?C)   ?SpO2: 95%   ? ?General exam: Pleasant male sitting up in bed, AAOx3, no distress ?HEENT: Positive JVD ?CVS: S1-S2, regular rate rhythm ?Lungs: Decreased breath sounds at the bases ?Abdomen: Soft, nontender, bowel sounds present   ?Extremities : Right great toe wound, healing well, status post amputation with dressing ?Skin: As above ?Psychiatry:  Mood & affect appropriate.  ? ? ?Discharge Instructions ? ? ?Discharge Instructions   ? ? Amb Referral to Cardiac Rehabilitation   Complete by: As directed ?  ? Diagnosis: Coronary Stents  ? After initial evaluation and assessments completed: Virtual Based Care may be provided alone or in conjunction with Phase 2 Cardiac Rehab based on patient barriers.: Yes  ? Diet - low sodium heart healthy   Complete by: As directed ?  ? Discharge wound care:   Complete by: As directed ?  ? Apply betadine using a swab to left and right foot wounds, daily, cover with Kerlix  ? Increase activity slowly   Complete by: As directed ?   ? ?  ? ?Allergies as of 06/29/2021   ? ?   Reactions  ? Metformin Hcl Other (See Comments)  ? Unknown reaction  ? Pravastatin Sodium Other (See Comments)  ? Muscle pain   ? ?  ? ?  ?Medication List  ?  ? ?STOP taking these medications   ? ?clopidogrel 75 MG tablet ?Commonly known as: PLAVIX ?  ?diltiazem 180 MG 24 hr tablet ?Commonly known as: CARDIZEM LA ?  ?furosemide 80 MG tablet ?Commonly known as: LASIX ?  ? ?  ? ?TAKE these medications   ? ?acetaminophen 500 MG tablet ?Commonly known as: TYLENOL ?Take 1,000 mg by mouth See admin instructions. Take 1000 mg 3 times daily, may take 1000 mg every 8 hours as needed for mild/moderate pain ?  ?aspirin EC 81 MG tablet ?Take 81 mg by mouth daily. ?  ?B-D ULTRAFINE III SHORT PEN 31G X 8 MM Misc ?Generic drug: Insulin Pen Needle ?Inject into the skin 5 (five) times daily. ?  ?bisoprolol 5 MG tablet ?Commonly known as: ZEBETA ?Take 1/2 tablet (2.5 mg total) by mouth daily. ?Start taking on: June 30, 2021 ?  ?Brilinta 90 MG Tabs tablet ?Generic drug: ticagrelor ?Take 1 tablet (90 mg total) by mouth 2 (two) times daily. ?  ?calcium carbonate 500 MG chewable tablet ?Commonly known as: TUMS - dosed in mg elemental calcium ?Chew 1,000 mg by mouth daily as needed for indigestion or heartburn. ?  ?famotidine 20 MG tablet ?Commonly known as:  PEPCID ?Take 20 mg by mouth every evening. ?  ?freestyle lancets ?AS DIRECTED THREE TIMES A DAY ?  ?FREESTYLE LITE test strip ?Generic drug: glucose blood ?TEST 3 TIMES A DAY E11.9 ?  ?isosorbide-hydrALAZINE 20-37.5 MG tablet ?Commonly known as: BIDIL ?Take 1/2 tablet by mouth 3 (three) times daily. ?  ?Jardiance 10 MG Tabs tablet ?Generic drug: empagliflozin ?Take 1 tablet (10 mg total) by mouth daily. ?Start taking on: June 30, 2021 ?  ?Lantus SoloStar 100 UNIT/ML Solostar Pen ?Generic drug: insulin glargine ?Inject 16 Units into the skin at bedtime. ?What changed: how much to take ?  ?nitroGLYCERIN 0.4 MG SL tablet ?Commonly known as:  NITROSTAT ?Place 0.4 mg under the tongue every 5 (five) minutes as needed for chest pain. ?  ?pantoprazole 40 MG tablet ?Commonly known as: PROTONIX ?Take 40 mg by mouth daily. ?  ?polyethylene glycol 17 g packet

## 2021-06-29 NOTE — Progress Notes (Signed)
Subjective:  ?Breathing improving ? ?Ambulated without oxygen today ? ?S/p Rt thoracentesis 3/12 ?S/p cardiac MRI 3/13 ?S/p cardiac catheterization 3/14 ?S/p Lt thoracentesis 3/15 ?S/p RCA PCI 3/17 ? ?Objective:  ?Vital Signs in the last 24 hours: ?Temp:  [97.2 ?F (36.2 ?C)-97.8 ?F (36.6 ?C)] 97.6 ?F (36.4 ?C) (03/21 0725) ?Pulse Rate:  [71-80] 80 (03/21 0952) ?Resp:  [18-19] 19 (03/21 0725) ?BP: (105-122)/(51-62) 107/55 (03/21 5277) ?SpO2:  [92 %-97 %] 95 % (03/21 0725) ?Weight:  [77.7 kg] 77.7 kg (03/21 0405) ? ?Intake/Output from previous day: ?03/20 0701 - 03/21 0700 ?In: 720 [P.O.:720] ?Out: 1700 [Urine:1700] ? ?Physical Exam ?Vitals and nursing note reviewed.  ?Constitutional:   ?   General: He is not in acute distress. ?Neck:  ?   Vascular: No JVD.  ?Cardiovascular:  ?   Rate and Rhythm: Normal rate and regular rhythm.  ?   Heart sounds: Normal heart sounds. No murmur heard. ?Pulmonary:  ?   Effort: Pulmonary effort is normal.  ?   Breath sounds: Examination of the right-lower field reveals decreased breath sounds. Decreased breath sounds present. No wheezing or rales.  ?Musculoskeletal:  ?   Right lower leg: Edema (Trace) present.  ?   Left lower leg: No edema.  ? ? ?Cardiac Studies: ? ?Reviewed and interpreted: ?BMP, BNP, trop HS ?  ?Imaging/tests reviewed and independently interpreted: ?  ?Cardiac Studies: ?  ?Telemetry 06/28/2021: ?Occasional PVC/NSVT <3 beats ? ?EKG 06/28/2021: ?Sinus rhythm 73 bpm.  Low voltage.  Old anterolateral infarct.  Poor R wave progression. ?  ?RCA intervention 06/25/2021: ?Severe prox-ostial calcific 80-95% stenoses ?Diffuse disease through the vessel, at least 50% ?Mid focal 80% stenosis ?   ?Targeted coronary intervention to ostial-prox RCA ?Temporary transvenous pacemaker placement ?Intravascular ultrasound (IVUS) ?Orbital atherectomy Diamondback 360 classic crown ?Successful percutaneous coronary intervention ostial-proc RCA ?       PTCA and stent placement 3.5 X 18 mm Onyx  drug-eluting stent ?       Post dilatation with 3.5X15 mm Ellsworth balloon at 22 atm ?       0% residual stenosis at the site of the stenosis ?       TIMI flow I-->II ?  ?Echocardiogram 06/19/2021: ? 1. Left ventricular ejection fraction, by estimation, is 25 to 30%. The  ?left ventricle has severely decreased function. The left ventricle  ?demonstrates regional wall motion abnormalities (see scoring  ?diagram/findings for description). Left ventricular  ?diastolic function could not be evaluated. There is severe hypokinesis of  ?the left ventricular, mid-apical anteroseptal wall, anterior wall and  ?apical segment.  ? 2. Right ventricular systolic function is mildly reduced. The right  ?ventricular size is normal. There is normal pulmonary artery systolic  ?pressure.  ? 3. Left atrial size was mildly dilated.  ? 4. Right atrial size was mildly dilated.  ? 5. The mitral valve is abnormal. Moderate mitral valve regurgitation.  ?Moderate mitral stenosis. Moderate mitral annular calcification. Mean PG 9  ?mmHg, MVA 1.34 cm2 by PHT method, likely more prominent due to resting  ?tachycardia around 104 bpm.  ? 6. Tricuspid valve regurgitation is mild to moderate.  ? 7. The aortic valve is tricuspid. There is moderate calcification of the  ?aortic valve. Aortic valve regurgitation is moderate. Aortic valve area,  ?by VTI measures 0.87 cm?Marland Kitchen Aortic valve mean gradient measures 22.0 mmHg.  ?Aortic valve Vmax measures 3.14  ?m/s. Dimensionless index 0.29.  ? 8. Moderate pleural effusion.  ? 9. Compared to  previous transthoracic and transesophageal studies in  ?04/2021, LVEF is reduced from 40-45%. Regional wall motion abnormalities  ?(LAD territory) are more pronounced Mild RV dysfunctinon is new. Valvular  ?findings are relatively unchanged.  ?  ?Echocardiogram 04/26/2021: ?1. Left ventricular ejection fraction, by estimation, is 40 to 45%. The  ?left ventricle has mildly decreased function. The left ventricle  ?demonstrates regional  wall motion abnormalities (see scoring  ?diagram/findings for description). Left ventricular  ?diastolic parameters are indeterminate.  ? 2. Right ventricular systolic function is normal. The right ventricular  ?size is normal. Tricuspid regurgitation signal is inadequate for assessing  ?PA pressure.  ? 3. Left atrial size was mildly dilated.  ? 4. Right atrial size was mildly dilated.  ? 5. The mitral valve is degenerative. No evidence of mitral valve  ?regurgitation. Mild to moderate mitral stenosis. The mean mitral valve  ?gradient is 6.0 mmHg with average heart rate of 88 bpm. Moderate to severe  ?mitral annular calcification.  ? 6. The aortic valve is tricuspid. Aortic valve regurgitation is not  ?visualized. Moderate to severe aortic valve stenosis. Aortic valve mean  ?gradient measures 28.0 mmHg. Aortic valve Vmax measures 3.82 m/s.  ? ?Conclusion(s)/Recommendation(s): No hemodynamically significant valvular  ?heart disease.  ?  ?Assessment & Recommendations: ?  ?76 y.o. Caucasian male  with hypertension, uncontrolled type 2 diabetes mellitus, peripheral artery disease, h/o osteomyelitis s/p right foot toe amputation, nonhealing wound left foot toe, h/o epidural abscess and MSSA bacteremia 04/2021, critical limb ischemia, moderate mitral and aortic stenosis, now admitted with congestive heart failure. ?  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  Not a candidate for CABG due to poor targets.  ?Underwent complex prox RCA PCI on 06/25/2021. ?Net -10.6 L, relatively euvolumic today. ?Cr down to 2.13. ?Okay to discharge today on bisoprolol 2.5 mg daily, Farxiga 10 mg daily, Bidil 1/2 tab tid, torsemide 20 mg PO daily. ?Unable to use ARN  I/ARB/MRA/SGLT2 i due to renal dysfunction. ?May consider adding in the future ? ?Labs on 3/27 (BMP, proBNP), f/u w/me on 3/29. ? ?CAD: ?Severe multivessel coronary artery disease. ?Not a surgical candidate due to poor targets. ?Underwent complex prox RCA PCI on 06/25/2021. ?Recommend DAPT with Aspirin/Brilinta at least till 12/2021. ?Continue statin, bisoprolol 2.5 mg ? ?Given renal dysfunction, no immediate plans for Lcx PCI. Will consider outpatient. ? ?Hyponatremia: ?Likely dilutional in the setting of decompensated heart failure. ?Improved ?  ?PAD: ?Absent pedal pulses on exam. ?Follows up with Dr. [name] with vascular interventional radiology. ?Plan for repeat angiography per Dr. [name] prior to anticipated amputation of left foot toes ?This may have to wait until after management of acute cardiac issues at this time. ?   ?Discussed with primary team. ? ? ? ?Nigel Mormon, MD ?Pager: [phone] ?Office: [phone] ? ?

## 2021-06-29 NOTE — TOC Transition Note (Signed)
Transition of Care (TOC) - CM/SW Discharge Note ? ? ?Patient Details  ?Name: Zachary [name] ?MRN: [id number] ?Date of Birth: Jun 16, 1945 ? ?Transition of Care (TOC) CM/SW Contact:  ?Zenon Mayo, RN ?Phone Number: ?06/29/2021, 11:06 AM ? ? ?Clinical Narrative:    ?Patient is for dc today, he is set up with [facility], NCM notified [name] of dc today, he has transport home.  ? ? ?Final next level of care: [name] ?Barriers to Discharge: Continued Medical Work up ? ? ?Patient Goals and CMS Choice ?Patient states their goals for this hospitalization and ongoing recovery are:: To return home ?CMS Medicare.gov Compare Post Acute Care list provided to:: Patient ?Choice offered to / list presented to : Patient, Spouse ? ?Discharge Placement ?  ?           ?  ?  ?  ?  ? ?Discharge Plan and Services ?  ?Discharge Planning Services: CM Consult ?Post Acute Care Choice: Home Health          ?DME Arranged: N/A ?DME Agency: NA ?  ?  ?  ?HH Arranged: PT, OT, RN ?[facility] Agency: [facility] ?Date HH Agency Contacted: 06/21/21 ?Time Vayas: 6808 ?Representative spoke with at [place]: Tommi Rumps ? ?Social Determinants of Health (SDOH) Interventions ?Food Insecurity Interventions: Intervention Not Indicated ?Financial Strain Interventions: Intervention Not Indicated ?Housing Interventions: Intervention Not Indicated ?Transportation Interventions: Intervention Not Indicated ? ? ?Readmission Risk Interventions ?No flowsheet data found. ? ? ? ? ?

## 2021-07-02 ENCOUNTER — Telehealth: Payer: Self-pay | Admitting: Cardiology

## 2021-07-02 ENCOUNTER — Telehealth: Payer: Self-pay

## 2021-07-02 DIAGNOSIS — N179 Acute kidney failure, unspecified: Secondary | ICD-10-CM

## 2021-07-02 NOTE — Telephone Encounter (Signed)
I received a call from patient's PCP Thedora Hinders, FNP.  Patient is doing well, has lost 22 pounds since his last visit with her.  Per Ms. Prevost, he does not have any leg edema, Rales on exam today.  Blood pressure is 118/64 mmHg, with heart rate of 73 bpm.  However, his creatinine today is 2.9 which is significantly higher than his discharge creatinine of 1.7.  Potassium is 4.6.  Patient does not have any disorientation, nausea, vomiting or any other signs suggesting uremia.  There is confusion regarding whether he is taking 1 full tablet of bisoprolol and BiDil, or half tablet.  This proposed we will confirm this with the family. ? ?I recommended the following: ?Increase oral hydration to at least 2-3 L/day. ?Hold torsemide for now. ?If patient taking 1 full tablets of bisoprolol and BiDil, reduce it to half tablet. ?Check labs on 07/05/2021 morning, as previously recommended. ?Keep follow-up appointment with me on 07/07/2021. ? ?In the meantime, should patient develop any signs suggesting uremia, such as nausea, vomiting, disorientation, he should go to emergency room.  Otherwise, I will see him next week as scheduled. ? ? ?Nigel Mormon, MD ?Pager: [phone] ?Office: [phone] ? ?

## 2021-07-02 NOTE — Telephone Encounter (Signed)
Patients wife called and wanted to know what medication you wanted patient to stop. She said that PCP told her you spoke with her and she can't remember what the name of the medication was. Please advise.  ?

## 2021-07-02 NOTE — Telephone Encounter (Signed)
I recommended the following: ?Increase oral hydration to at least 2-3 L/day. ?Hold torsemide for now. ?If patient taking 1 full tablets of bisoprolol and BiDil, reduce it to half tablet. ?Check labs on 07/05/2021 morning, as previously recommended. ?Keep follow-up appointment with me on 07/07/2021. ? ?Thanks ?MJP ? ?

## 2021-07-02 NOTE — Telephone Encounter (Signed)
Called pt, wife answer and was informed about the message above. Pt wife understood.

## 2021-07-06 ENCOUNTER — Inpatient Hospital Stay (HOSPITAL_COMMUNITY)
Admission: EM | Admit: 2021-07-06 | Discharge: 2021-07-08 | DRG: 286 | Disposition: A | Discharge status: Hospice | Payer: Medicare PPO | Attending: Family Medicine | Admitting: Family Medicine

## 2021-07-06 ENCOUNTER — Ambulatory Visit
Admission: RE | Admit: 2021-07-06 | Discharge: 2021-07-06 | Disposition: A | Discharge status: Home / Self Care | Payer: Medicare PPO | Source: Ambulatory Visit | Attending: Interventional Radiology | Admitting: Interventional Radiology

## 2021-07-06 ENCOUNTER — Emergency Department (HOSPITAL_COMMUNITY): Payer: Medicare PPO

## 2021-07-06 ENCOUNTER — Other Ambulatory Visit: Payer: Self-pay

## 2021-07-06 ENCOUNTER — Encounter (HOSPITAL_COMMUNITY): Payer: Self-pay | Admitting: Pharmacy Technician

## 2021-07-06 ENCOUNTER — Other Ambulatory Visit (HOSPITAL_COMMUNITY): Payer: Self-pay

## 2021-07-06 ENCOUNTER — Telehealth (HOSPITAL_COMMUNITY): Payer: Self-pay

## 2021-07-06 ENCOUNTER — Inpatient Hospital Stay (HOSPITAL_COMMUNITY): Payer: Medicare PPO

## 2021-07-06 ENCOUNTER — Telehealth: Payer: Self-pay | Admitting: Cardiology

## 2021-07-06 ENCOUNTER — Encounter (HOSPITAL_COMMUNITY): Payer: Medicare PPO

## 2021-07-06 DIAGNOSIS — I130 Hypertensive heart and chronic kidney disease with heart failure and stage 1 through stage 4 chronic kidney disease, or unspecified chronic kidney disease: Principal | ICD-10-CM | POA: Diagnosis present

## 2021-07-06 DIAGNOSIS — L89152 Pressure ulcer of sacral region, stage 2: Secondary | ICD-10-CM | POA: Diagnosis present

## 2021-07-06 DIAGNOSIS — M869 Osteomyelitis, unspecified: Secondary | ICD-10-CM | POA: Diagnosis present

## 2021-07-06 DIAGNOSIS — Z7902 Long term (current) use of antithrombotics/antiplatelets: Secondary | ICD-10-CM

## 2021-07-06 DIAGNOSIS — Z7984 Long term (current) use of oral hypoglycemic drugs: Secondary | ICD-10-CM

## 2021-07-06 DIAGNOSIS — I429 Cardiomyopathy, unspecified: Secondary | ICD-10-CM | POA: Diagnosis present

## 2021-07-06 DIAGNOSIS — N179 Acute kidney failure, unspecified: Secondary | ICD-10-CM | POA: Diagnosis present

## 2021-07-06 DIAGNOSIS — Z79899 Other long term (current) drug therapy: Secondary | ICD-10-CM

## 2021-07-06 DIAGNOSIS — E118 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: Secondary | ICD-10-CM | POA: Diagnosis present

## 2021-07-06 DIAGNOSIS — Z66 Do not resuscitate: Secondary | ICD-10-CM | POA: Diagnosis not present

## 2021-07-06 DIAGNOSIS — Z888 Allergy status to other drugs, medicaments and biological substances status: Secondary | ICD-10-CM

## 2021-07-06 DIAGNOSIS — Z955 Presence of coronary angioplasty implant and graft: Secondary | ICD-10-CM

## 2021-07-06 DIAGNOSIS — Z515 Encounter for palliative care: Secondary | ICD-10-CM

## 2021-07-06 DIAGNOSIS — I5023 Acute on chronic systolic (congestive) heart failure: Secondary | ICD-10-CM | POA: Diagnosis present

## 2021-07-06 DIAGNOSIS — I739 Peripheral vascular disease, unspecified: Secondary | ICD-10-CM | POA: Diagnosis present

## 2021-07-06 DIAGNOSIS — E1165 Type 2 diabetes mellitus with hyperglycemia: Secondary | ICD-10-CM | POA: Diagnosis present

## 2021-07-06 DIAGNOSIS — N1831 Chronic kidney disease, stage 3a: Secondary | ICD-10-CM | POA: Diagnosis present

## 2021-07-06 DIAGNOSIS — E1122 Type 2 diabetes mellitus with diabetic chronic kidney disease: Secondary | ICD-10-CM | POA: Diagnosis present

## 2021-07-06 DIAGNOSIS — N183 Chronic kidney disease, stage 3 unspecified: Secondary | ICD-10-CM

## 2021-07-06 DIAGNOSIS — N289 Disorder of kidney and ureter, unspecified: Secondary | ICD-10-CM

## 2021-07-06 DIAGNOSIS — E1140 Type 2 diabetes mellitus with diabetic neuropathy, unspecified: Secondary | ICD-10-CM | POA: Diagnosis present

## 2021-07-06 DIAGNOSIS — L8990 Pressure ulcer of unspecified site, unspecified stage: Secondary | ICD-10-CM | POA: Insufficient documentation

## 2021-07-06 DIAGNOSIS — I5021 Acute systolic (congestive) heart failure: Secondary | ICD-10-CM | POA: Diagnosis not present

## 2021-07-06 DIAGNOSIS — K5900 Constipation, unspecified: Secondary | ICD-10-CM | POA: Diagnosis present

## 2021-07-06 DIAGNOSIS — I5020 Unspecified systolic (congestive) heart failure: Secondary | ICD-10-CM

## 2021-07-06 DIAGNOSIS — E7800 Pure hypercholesterolemia, unspecified: Secondary | ICD-10-CM | POA: Diagnosis present

## 2021-07-06 DIAGNOSIS — F419 Anxiety disorder, unspecified: Secondary | ICD-10-CM | POA: Diagnosis present

## 2021-07-06 DIAGNOSIS — E1151 Type 2 diabetes mellitus with diabetic peripheral angiopathy without gangrene: Secondary | ICD-10-CM | POA: Diagnosis present

## 2021-07-06 DIAGNOSIS — Z7982 Long term (current) use of aspirin: Secondary | ICD-10-CM | POA: Diagnosis not present

## 2021-07-06 DIAGNOSIS — E1169 Type 2 diabetes mellitus with other specified complication: Secondary | ICD-10-CM | POA: Diagnosis present

## 2021-07-06 DIAGNOSIS — Z794 Long term (current) use of insulin: Secondary | ICD-10-CM

## 2021-07-06 DIAGNOSIS — I509 Heart failure, unspecified: Secondary | ICD-10-CM | POA: Insufficient documentation

## 2021-07-06 DIAGNOSIS — Z7189 Other specified counseling: Secondary | ICD-10-CM | POA: Diagnosis not present

## 2021-07-06 DIAGNOSIS — Z8619 Personal history of other infectious and parasitic diseases: Secondary | ICD-10-CM

## 2021-07-06 DIAGNOSIS — Z89421 Acquired absence of other right toe(s): Secondary | ICD-10-CM | POA: Diagnosis not present

## 2021-07-06 DIAGNOSIS — I2510 Atherosclerotic heart disease of native coronary artery without angina pectoris: Secondary | ICD-10-CM | POA: Diagnosis present

## 2021-07-06 DIAGNOSIS — R0602 Shortness of breath: Secondary | ICD-10-CM | POA: Diagnosis present

## 2021-07-06 LAB — BASIC METABOLIC PANEL
Anion gap: 12 (ref 5–15)
BUN/Creatinine Ratio: 13 (ref 10–24)
BUN: 33 mg/dL — ABNORMAL HIGH (ref 8–27)
BUN: 35 mg/dL — ABNORMAL HIGH (ref 8–23)
CO2: 21 mmol/L (ref 20–29)
CO2: 21 mmol/L — ABNORMAL LOW (ref 22–32)
Calcium: 9.1 mg/dL (ref 8.6–10.2)
Calcium: 8.7 mg/dL — ABNORMAL LOW (ref 8.9–10.3)
Chloride: 96 mmol/L (ref 96–106)
Chloride: 100 mmol/L (ref 98–111)
Creatinine, Ser: 2.48 mg/dL — ABNORMAL HIGH (ref 0.76–1.27)
Creatinine, Ser: 2.56 mg/dL — ABNORMAL HIGH (ref 0.61–1.24)
GFR, Estimated: 25 mL/min — ABNORMAL LOW (ref >60)
Glucose, Bld: 242 mg/dL — ABNORMAL HIGH (ref 70–99)
Glucose: 104 mg/dL — ABNORMAL HIGH (ref 70–99)
Potassium: 5.3 mmol/L — ABNORMAL HIGH (ref 3.5–5.2)
Potassium: 5.1 mmol/L (ref 3.5–5.1)
Sodium: 134 mmol/L (ref 134–144)
Sodium: 133 mmol/L — ABNORMAL LOW (ref 135–145)
eGFR: 26 mL/min/{1.73_m2} — ABNORMAL LOW (ref >59)

## 2021-07-06 LAB — CBC WITH DIFFERENTIAL/PLATELET
Abs Immature Granulocytes: 0.06 10*3/uL (ref 0.00–0.07)
Basophils Absolute: 0.1 10*3/uL (ref 0.0–0.1)
Basophils Relative: 1 %
Eosinophils Absolute: 0.2 10*3/uL (ref 0.0–0.5)
Eosinophils Relative: 2 %
HCT: 38.8 % — ABNORMAL LOW (ref 39.0–52.0)
Hemoglobin: 12.9 g/dL — ABNORMAL LOW (ref 13.0–17.0)
Immature Granulocytes: 1 %
Lymphocytes Relative: 4 %
Lymphs Abs: 0.4 10*3/uL — ABNORMAL LOW (ref 0.7–4.0)
MCH: 28.6 pg (ref 26.0–34.0)
MCHC: 33.2 g/dL (ref 30.0–36.0)
MCV: 86 fL (ref 80.0–100.0)
Monocytes Absolute: 0.5 10*3/uL (ref 0.1–1.0)
Monocytes Relative: 6 %
Neutro Abs: 7.9 10*3/uL — ABNORMAL HIGH (ref 1.7–7.7)
Neutrophils Relative %: 86 %
Platelets: 262 10*3/uL (ref 150–400)
RBC: 4.51 MIL/uL (ref 4.22–5.81)
RDW: 13.6 % (ref 11.5–15.5)
WBC: 9.2 10*3/uL (ref 4.0–10.5)
nRBC: 0 % (ref 0.0–0.2)

## 2021-07-06 LAB — GLUCOSE, CAPILLARY
Glucose-Capillary: 281 mg/dL — ABNORMAL HIGH (ref 70–99)
Glucose-Capillary: 162 mg/dL — ABNORMAL HIGH (ref 70–99)

## 2021-07-06 LAB — TROPONIN I (HIGH SENSITIVITY)
Troponin I (High Sensitivity): 30 ng/L — ABNORMAL HIGH (ref <18)
Troponin I (High Sensitivity): 30 ng/L — ABNORMAL HIGH (ref <18)

## 2021-07-06 LAB — PRO B NATRIURETIC PEPTIDE: NT-Pro BNP: 50280 pg/mL — ABNORMAL HIGH (ref 0–486)

## 2021-07-06 LAB — BRAIN NATRIURETIC PEPTIDE: B Natriuretic Peptide: 1353 pg/mL — ABNORMAL HIGH (ref 0.0–100.0)

## 2021-07-06 MED ORDER — ONDANSETRON HCL 4 MG/2ML IJ SOLN: 4.0000 mg | Freq: Four times a day (QID) | INTRAMUSCULAR | Status: DC | PRN

## 2021-07-06 MED ORDER — SODIUM CHLORIDE 0.9 % IV SOLN: 250.0000 mL | INTRAVENOUS | Status: DC | PRN

## 2021-07-06 MED ORDER — ROSUVASTATIN CALCIUM 20 MG PO TABS
20.0000 mg | ORAL_TABLET | Freq: Every day | ORAL | Status: DC
  Administered 2021-07-07: 20 mg via ORAL
  Filled 2021-07-06: qty 1

## 2021-07-06 MED ORDER — ASPIRIN EC 81 MG PO TBEC
81.0000 mg | DELAYED_RELEASE_TABLET | Freq: Every day | ORAL | Status: DC
Start: 2021-07-07 — End: 2021-07-08
  Administered 2021-07-08: 81 mg via ORAL
  Filled 2021-07-06 (×2): qty 1

## 2021-07-06 MED ORDER — SODIUM CHLORIDE 0.9% FLUSH
3.0000 mL | Freq: Two times a day (BID) | INTRAVENOUS | Status: DC
  Administered 2021-07-06 (×2): 3 mL via INTRAVENOUS

## 2021-07-06 MED ORDER — BOOST PO LIQD
237.0000 mL | ORAL | Status: DC
  Administered 2021-07-06 – 2021-07-07 (×2): 237 mL via ORAL
  Filled 2021-07-06 (×3): qty 237

## 2021-07-06 MED ORDER — INSULIN ASPART 100 UNIT/ML IJ SOLN
0.0000 [IU] | Freq: Three times a day (TID) | INTRAMUSCULAR | Status: DC
  Administered 2021-07-06: 5 [IU] via SUBCUTANEOUS
  Administered 2021-07-07: 1 [IU] via SUBCUTANEOUS
  Administered 2021-07-07: 3 [IU] via SUBCUTANEOUS
  Administered 2021-07-07: 1 [IU] via SUBCUTANEOUS
  Administered 2021-07-08: 2 [IU] via SUBCUTANEOUS

## 2021-07-06 MED ORDER — INSULIN GLARGINE-YFGN 100 UNIT/ML ~~LOC~~ SOLN
16.0000 [IU] | Freq: Every day | SUBCUTANEOUS | Status: DC
  Administered 2021-07-06 – 2021-07-07 (×2): 16 [IU] via SUBCUTANEOUS
  Filled 2021-07-06 (×3): qty 0.16

## 2021-07-06 MED ORDER — ACETAMINOPHEN 325 MG PO TABS
650.0000 mg | ORAL_TABLET | ORAL | Status: DC | PRN
  Administered 2021-07-06: 650 mg via ORAL
  Filled 2021-07-06: qty 2

## 2021-07-06 MED ORDER — POLYETHYLENE GLYCOL 3350 17 G PO PACK
17.0000 g | PACK | Freq: Two times a day (BID) | ORAL | Status: DC
  Administered 2021-07-06 – 2021-07-08 (×3): 17 g via ORAL
  Filled 2021-07-06 (×3): qty 1

## 2021-07-06 MED ORDER — BISOPROLOL FUMARATE 5 MG PO TABS
2.5000 mg | ORAL_TABLET | Freq: Every day | ORAL | Status: DC
  Administered 2021-07-07 – 2021-07-08 (×2): 2.5 mg via ORAL
  Filled 2021-07-06 (×2): qty 1

## 2021-07-06 MED ORDER — TICAGRELOR 90 MG PO TABS
90.0000 mg | ORAL_TABLET | Freq: Two times a day (BID) | ORAL | Status: DC
  Administered 2021-07-06 – 2021-07-08 (×4): 90 mg via ORAL
  Filled 2021-07-06 (×4): qty 1

## 2021-07-06 MED ORDER — HEPARIN SODIUM (PORCINE) 5000 UNIT/ML IJ SOLN
5000.0000 [IU] | Freq: Two times a day (BID) | INTRAMUSCULAR | Status: DC
  Administered 2021-07-06 (×2): 5000 [IU] via SUBCUTANEOUS
  Filled 2021-07-06 (×2): qty 1

## 2021-07-06 MED ORDER — SODIUM CHLORIDE 0.9% FLUSH: 3.0000 mL | INTRAVENOUS | Status: DC | PRN

## 2021-07-06 MED ORDER — PANTOPRAZOLE SODIUM 40 MG PO TBEC
40.0000 mg | DELAYED_RELEASE_TABLET | Freq: Every day | ORAL | Status: DC
  Administered 2021-07-07 – 2021-07-08 (×2): 40 mg via ORAL
  Filled 2021-07-06 (×2): qty 1

## 2021-07-06 MED ORDER — FUROSEMIDE 10 MG/ML IJ SOLN: 40.0000 mg | Freq: Every day | INTRAMUSCULAR | Status: DC

## 2021-07-06 MED ORDER — SODIUM CHLORIDE 0.9% FLUSH
3.0000 mL | Freq: Two times a day (BID) | INTRAVENOUS | Status: DC
  Administered 2021-07-06 – 2021-07-07 (×3): 3 mL via INTRAVENOUS

## 2021-07-06 MED ORDER — ACETAMINOPHEN 500 MG PO TABS: 1000.0000 mg | ORAL_TABLET | Freq: Three times a day (TID) | ORAL | Status: DC | PRN

## 2021-07-06 MED ORDER — ISOSORB DINITRATE-HYDRALAZINE 20-37.5 MG PO TABS
0.5000 | ORAL_TABLET | Freq: Three times a day (TID) | ORAL | Status: DC
  Administered 2021-07-06 – 2021-07-07 (×5): 0.5 via ORAL
  Filled 2021-07-06: qty 0.5
  Filled 2021-07-06 (×4): qty 1
  Filled 2021-07-06: qty 0.5
  Filled 2021-07-06: qty 1

## 2021-07-06 MED ORDER — FUROSEMIDE 10 MG/ML IJ SOLN
40.0000 mg | Freq: Once | INTRAMUSCULAR | Status: AC
  Administered 2021-07-06: 40 mg via INTRAVENOUS
  Filled 2021-07-06: qty 4

## 2021-07-06 MED ORDER — INSULIN GLARGINE-YFGN 100 UNIT/ML ~~LOC~~ SOLN
16.0000 [IU] | Freq: Every day | SUBCUTANEOUS | Status: DC
  Filled 2021-07-06: qty 0.16

## 2021-07-06 NOTE — ED Provider Triage Note (Signed)
Emergency Medicine Provider Triage Evaluation Note ? ?Zachary [name] , a 76 y.o. male  was evaluated in triage.  Pt complains of he was sent over from his PCP with concern for decompensated heart failure, cardiorenal syndrome.  He endorses shortness of breath for the last day.  He has a history of some heart failure.  He denies any swelling on his extremities.  He denies any chest pain, headache.  He reports that he feels some fatigue, general malaise, difficulty breathing today.  He is still making urine, no dysuria or decreased output.  He has a history of diabetes, reports that his sugars been normal for him.  He is taking ticagrelor, on brief chart review it seems to be related to previous heart stent, temporary pacemaker placement earlier this year.  Patient does not know what lab value prompted him to be sent to the doctor, on chart review he seems to have some hyperkalemia, elevated creatinine, NT proBNP of 50,280. ? ?Review of Systems  ?Positive: shob ?Negative: Chest pain, dysuria, issue with urination ? ?Physical Exam  ?BP (!) 103/56 (BP Location: Right Arm)   Pulse 79   Temp 97.8 ?F (36.6 ?C) (Oral)   Resp 16   SpO2 94%  ?Gen:   Awake, no distress   ?Resp:  Normal effort  ?MSK:   Moves extremities without difficulty  ?Other:  Some decreased breath sounds throughout, no ronchi, rales, wheezing ? ?Medical Decision Making  ?Medically screening exam initiated at 10:01 AM.  Appropriate orders placed.  Zachary [name] was informed that the remainder of the evaluation will be completed by another provider, this initial triage assessment does not replace that evaluation, and the importance of remaining in the ED until their evaluation is complete. ? ?Workup initiated ?  ?Anselmo Pickler, PA-C ?07/06/21 1005 ? ?

## 2021-07-06 NOTE — ED Triage Notes (Signed)
Pt here for abnormal kidney function and concern for decompensated heart failure as well as cardiorenal syndrome. Pt endorses shob with exertion.  ?

## 2021-07-06 NOTE — ED Notes (Signed)
US tech at bedside

## 2021-07-06 NOTE — Telephone Encounter (Signed)
Reviewed lab results with patient's wife Mrs. Faith Scantlin over the phone.  Creatinine 2.48, down from 2.9 last week.  Potassium 5.3.  NT proBNP 50,000. ?Per his wife, he has not gained any weight, there is no leg edema, but his breathing is "not good".  He also has a lot of nausea. ?I am concerned about decompensated heart failure.  At present, there is no uremia, but he certainly seems to have cardiorenal syndrome. ?I think he will be best managed with other inpatient admission to consider IV diuresis, perhaps guided by right heart catheterization, with inpatient nephrology consultation.  He may also benefit from renal artery duplex ultrasound to evaluate for renal artery stenosis. ?I conveyed my recommendations to patient's wife.  She will bring him to emergency room.   ?Recommend medical admission.  I will continue to follow and consultative role. ? ? ?Nigel Mormon, MD ?Pager: [phone] ?Office: [phone] ? ?

## 2021-07-06 NOTE — ED Provider Notes (Signed)
?[facility] ?Provider Note ? ? ?CSN: [id number] ?Arrival date & time: 07/06/21  0941 ? ?  ? ?History ? ?Chief Complaint  ?Patient presents with  ? Abnormal Lab  ? Shortness of Breath  ? ? ?Zachary [name] is a 76 y.o. male. ? ?HPI ?Patient presents with his son who assists with history.  Additional details obtained on chart review from his cardiologist note.  Spoke with his cardiologist today, was referred here.  Patient presents due to worsening dyspnea, fatigue.  He has multiple medical issues including heart failure, CKD, has been taking his medication regularly.  In spite of this he has had worsening symptoms, inability to perform ADL over the past few days.  No current focal pain. ?  ? ?Home Medications ?Prior to Admission medications   ?Medication Sig Start Date End Date Taking? Authorizing Provider  ?acetaminophen (TYLENOL) 500 MG tablet Take 1,000 mg by mouth See admin instructions. Take 1000 mg 3 times daily, may take 1000 mg every 8 hours as needed for mild/moderate pain    [provider]  ?aspirin EC 81 MG tablet Take 81 mg by mouth daily.    [provider]  ?B-D ULTRAFINE III SHORT PEN 31G X 8 MM MISC Inject into the skin 5 (five) times daily. 11/26/19   [provider]  ?bisoprolol (ZEBETA) 5 MG tablet Take 1/2 tablet (2.5 mg total) by mouth daily. 06/30/21   Domenic Polite, MD  ?calcium carbonate (TUMS - DOSED IN MG ELEMENTAL CALCIUM) 500 MG chewable tablet Chew 1,000 mg by mouth daily as needed for indigestion or heartburn.    [provider]  ?empagliflozin (JARDIANCE) 10 MG TABS tablet Take 1 tablet (10 mg total) by mouth daily. 06/30/21   Domenic Polite, MD  ?famotidine (PEPCID) 20 MG tablet Take 20 mg by mouth every evening.    [provider]  ?glucose blood (FREESTYLE LITE) test strip TEST 3 TIMES A DAY E11.9    [provider]  ?insulin glargine (LANTUS SOLOSTAR) 100 UNIT/ML Solostar Pen Inject 16 Units  into the skin at bedtime. 06/29/21   Domenic Polite, MD  ?isosorbide-hydrALAZINE (BIDIL) 20-37.5 MG tablet Take 1/2 tablet by mouth 3 (three) times daily. 06/29/21   Domenic Polite, MD  ?Lancets (FREESTYLE) lancets AS DIRECTED THREE TIMES A DAY    [provider]  ?nitroGLYCERIN (NITROSTAT) 0.4 MG SL tablet Place 0.4 mg under the tongue every 5 (five) minutes as needed for chest pain.    [provider]  ?pantoprazole (PROTONIX) 40 MG tablet Take 40 mg by mouth daily.    [provider]  ?polyethylene glycol (MIRALAX / GLYCOLAX) 17 g packet Take 17 g by mouth 2 (two) times daily. 04/28/21   Debbe Odea, MD  ?Potassium Chloride ER 20 MEQ TBCR Take 20 mEq by mouth daily. 06/29/21   Domenic Polite, MD  ?rosuvastatin (CRESTOR) 20 MG tablet Take 1 tablet (20 mg total) by mouth daily. 06/30/21   Domenic Polite, MD  ?ticagrelor (BRILINTA) 90 MG TABS tablet Take 1 tablet (90 mg total) by mouth 2 (two) times daily. 06/29/21   Domenic Polite, MD  ?torsemide (DEMADEX) 20 MG tablet Take 1 tablet (20 mg total) by mouth daily. 06/30/21   Domenic Polite, MD  ?   ? ?Allergies    ?Metformin hcl and Pravastatin sodium   ? ?Review of Systems   ?Review of Systems  ?Constitutional:   ?     Per HPI, otherwise negative  ?  HENT:    ?     Per HPI, otherwise negative  ?Respiratory:    ?     Per HPI, otherwise negative  ?Cardiovascular:   ?     Per HPI, otherwise negative  ?Gastrointestinal:  Negative for vomiting.  ?Endocrine:  ?     Negative aside from HPI  ?Genitourinary:   ?     Neg aside from HPI   ?Musculoskeletal:   ?     Per HPI, otherwise negative  ?Skin: Negative.   ?Neurological:  Negative for syncope.  ? ?Physical Exam ?Updated Vital Signs ?BP 125/78   Pulse 78   Temp 97.8 ?F (36.6 ?C) (Oral)   Resp (!) 27   SpO2 95%  ?Physical Exam ?Vitals and nursing note reviewed.  ?Constitutional:   ?   Appearance: He is ill-appearing. He is not toxic-appearing.  ?HENT:  ?   Head: Normocephalic and atraumatic.   ?Eyes:  ?   Conjunctiva/sclera: Conjunctivae normal.  ?Cardiovascular:  ?   Rate and Rhythm: Normal rate and regular rhythm.  ?Pulmonary:  ?   Effort: Pulmonary effort is normal. Tachypnea present. No respiratory distress.  ?   Breath sounds: Decreased breath sounds present.  ?Abdominal:  ?   General: There is no distension.  ?Musculoskeletal:  ?   Comments: Multiple prior amputations of toes, no evidence for cellulitis surrounding those surgical sites.  ?Skin: ?   General: Skin is warm and dry.  ?Neurological:  ?   Mental Status: He is alert and oriented to person, place, and time.  ? ? ?ED Results / Procedures / Treatments   ?Labs ?(all labs ordered are listed, but only abnormal results are displayed) ?Labs Reviewed  ?BASIC METABOLIC PANEL - Abnormal; Notable for the following components:  ?    Result Value  ? Sodium 133 (*)   ? CO2 21 (*)   ? Glucose, Bld 242 (*)   ? BUN 35 (*)   ? Creatinine, Ser 2.56 (*)   ? Calcium 8.7 (*)   ? GFR, Estimated 25 (*)   ? All other components within normal limits  ?CBC WITH DIFFERENTIAL/PLATELET - Abnormal; Notable for the following components:  ? Hemoglobin 12.9 (*)   ? HCT 38.8 (*)   ? Neutro Abs 7.9 (*)   ? Lymphs Abs 0.4 (*)   ? All other components within normal limits  ?TROPONIN I (HIGH SENSITIVITY) - Abnormal; Notable for the following components:  ? Troponin I (High Sensitivity) 30 (*)   ? All other components within normal limits  ?CBC WITH DIFFERENTIAL/PLATELET  ?BRAIN NATRIURETIC PEPTIDE  ?TROPONIN I (HIGH SENSITIVITY)  ? ? ?EKG ?None ? ?Radiology ?DG Chest 2 View ? ?Result Date: 07/06/2021 ?CLINICAL DATA:  Shortness of breath EXAM: CHEST - 2 VIEW COMPARISON:  Chest radiograph dated June 26, 2021 FINDINGS: The heart is enlarged. Atherosclerotic calcification of the aortic arch. Bilateral lower lobe opacities which likely represent bilateral pleural effusions with associated atelectasis. Low lung volumes. No acute osseous abnormality. IMPRESSION: 1. Stable  cardiomegaly. 2. Interval increase in bilateral pleural effusions with associated atelectasis or infiltrate. Electronically Signed   By: Keane Police D.O.   On: 07/06/2021 10:24   ? ?Procedures ?Procedures  ? ? ?Medications Ordered in ED ?Medications  ?furosemide (LASIX) injection 40 mg (has no administration in time range)  ? ? ?ED Course/ Medical Decision Making/ A&P ?This patient with a Hx of CHF, CKD, hypertension, diabetes presents to the ED for concern of dyspnea, fatigue,  this involves an extensive number of treatment options, and is a complaint that carries with it a high risk of complications and morbidity.   ? ?The differential diagnosis includes heart failure exacerbation, renal failure, cardiorenal phenomena, bacteremia, sepsis, pneumonia ? ? ?Social Determinants of Health: ? ?Age, insulin-dependent diabetes ? ?Additional history obtained: ? ?Additional history and/or information obtained from son, cardiology note, notable for catheterization, echocardiogram within the past 2 weeks results as below: ?IMPRESSIONS  ? ? ? 1. Left ventricular ejection fraction, by estimation, is 25 to 30%. The  ?left ventricle has severely decreased function. The left ventricle  ?demonstrates regional wall motion abnormalities (see scoring  ?diagram/findings for description). Left ventricular  ?diastolic function could not be evaluated. There is severe hypokinesis of  ?the left ventricular, mid-apical anteroseptal wall, anterior wall and  ?apical segment.  ? 2. Right ventricular systolic function is mildly reduced. The right  ?ventricular size is normal. There is normal pulmonary artery systolic  ?pressure.  ? 3. Left atrial size was mildly dilated.  ? 4. Right atrial size was mildly dilated.  ? 5. The mitral valve is abnormal. Moderate mitral valve regurgitation.  ?Moderate mitral stenosis. Moderate mitral annular calcification. Mean PG 9  ?mmHg, MVA 1.34 cm2 by PHT method, likely more prominent due to resting  ?tachycardia  around 104 bpm.  ? 6. Tricuspid valve regurgitation is mild to moderate.  ? 7. The aortic valve is tricuspid. There is moderate calcification of the  ?aortic valve. Aortic valve regurgitation is moderate. Aortic valv

## 2021-07-06 NOTE — Progress Notes (Signed)
Heart Failure Navigator Progress Note ? ?Following this hospitalization to assess for HV TOC readiness.  ? ?Patient in the ER, Dr.Patwardhan was at bedside , plan for potential right heart cath per MD this admission. Patient has had multiple admissions, poor EF.  ? ?Earnestine Leys, BSN, RN ?Heart Failure Nurse Navigator ?[phone]   ?

## 2021-07-06 NOTE — Consult Note (Signed)
CARDIOLOGY CONSULT NOTE  ?Patient ID: ?Zachary [name] ?MRN: [id number] ?DOB/AGE: Feb 13, 1946 76 y.o. ? ?Admit date: 07/06/2021 ?Referring Physician: [facility] hospitalist ?Primary Physician: [place] ?Reason for Consultation: Heart failure ? ?HPI:  ? ?76 y.o. Caucasian male  with hypertension, uncontrolled type 2 diabetes mellitus, peripheral artery disease, h/o osteomyelitis s/p right foot toe amputation, nonhealing wound left foot toe, h/o epidural abscess and MSSA bacteremia 04/2021, critical limb ischemia, moderate mitral and aortic stenosis, admitted with acute on chronic systolic heart failure ? ?Patient was recently discharged after hospitalization for heart failure, during which she also underwent complex PCI to his right coronary artery, with residual disease in left circumflex to be treated in a staged manner subject to renal function.  Patient had been doing better in the initial days after his discharge.  On 07/02/2021, he was seen by his PCP.  His creatinine was noted to be elevated at 2.9.  His torsemide was held.  Repeat creatinine yesterday was reduced to 2.48, but proBNP was severely elevated at 50,000.  Patient has been having worsening dyspnea, associated with nausea symptoms.  He denies any chest pain.  Patient's first question to me is "when can I go home?". ? ? ? ? ?Past Medical History:  ?Diagnosis Date  ? AKI (acute kidney injury) ([place]) 06/18/2021  ? Diabetes mellitus   ? Hypercholesteremia   ? Hypertension   ? Patella fracture   ? left  ? PVD (peripheral vascular disease) ([place])   ?  ? ?Past Surgical History:  ?Procedure Laterality Date  ? AMPUTATION    ? AMPUTATION  11/28/2011  ? Procedure: AMPUTATION RAY;  Surgeon: [name], MD;  [facility]: [facility];  Service: Orthopedics;  Laterality: Left;  left 2nd toe amputation  ? AMPUTATION TOE Right 09/14/2019  ? Procedure: AMPUTATION TOE RIGHT SECOND TOE;  Surgeon: [name], [name];  Location: [facility];  Service: Podiatry;   Laterality: Right;  ? AMPUTATION TOE Right 04/20/2021  ? Procedure: AMPUTATION GREAT TOE; APPLICATION OF SKIN GRAFT SUBSTITUTE;  Surgeon: [name], [name];  Location: [place];  Service: Podiatry;  Laterality: Right;  ? BUBBLE STUDY  04/26/2021  ? Procedure: BUBBLE STUDY;  Surgeon: Geralynn Rile, MD;  Location: [place];  Service: Cardiovascular;;  ? CORONARY ATHERECTOMY N/A 06/25/2021  ? Procedure: CORONARY ATHERECTOMY;  Surgeon: Nigel Mormon, MD;  Location: [place] [facility];  Service: Cardiovascular;  Laterality: N/A;  ? CORONARY STENT INTERVENTION N/A 06/25/2021  ? Procedure: CORONARY STENT INTERVENTION;  Surgeon: Nigel Mormon, MD;  Location: [place] [facility];  Service: Cardiovascular;  Laterality: N/A;  ? IR ANGIOGRAM EXTREMITY BILATERAL  03/03/2021  ? IR ANGIOGRAM EXTREMITY LEFT  05/14/2021  ? IR ANGIOGRAM EXTREMITY RIGHT  03/03/2021  ? IR ANGIOGRAM EXTREMITY RIGHT  04/14/2021  ? IR FEM POP ART PTA MOD SED  03/03/2021  ? IR FEM POP INC ATHEREC / STENT / PTA MOD SED  05/14/2021  ? IR INTRAVASCULAR ULTRASOUND NON CORONARY  03/03/2021  ? IR RADIOLOGIST EVAL & MGMT  09/27/2019  ? IR RADIOLOGIST EVAL & MGMT  12/18/2019  ? IR RADIOLOGIST EVAL & MGMT  02/11/2021  ? IR RADIOLOGIST EVAL & MGMT  04/09/2021  ? IR THORACENTESIS ASP PLEURAL SPACE W/IMG GUIDE  06/23/2021  ? IR TIB-PERO ART ATHEREC INC PTA MOD SED  03/03/2021  ? IR TIB-PERO ART PTA MOD SED  04/14/2021  ? IR US GUIDE VASC ACCESS LEFT  03/03/2021  ? IR US GUIDE  VASC ACCESS RIGHT  04/14/2021  ? IR US GUIDE VASC ACCESS RIGHT  05/14/2021  ? LEFT HEART CATH N/A 06/25/2021  ? Procedure: Left Heart Cath;  Surgeon: Nigel Mormon, MD;  Location: [place] [facility];  Service: Cardiovascular;  Laterality: N/A;  ? ORIF PATELLA Left 01/10/2019  ? Procedure: OPEN REDUCTION INTERNAL (ORIF) FIXATION LEFT PATELLA FRACTURE;  Surgeon: [name], MD;  Location: [place];  Service: Orthopedics;  Laterality: Left;  ? RIGHT/LEFT HEART CATH AND  CORONARY ANGIOGRAPHY N/A 06/22/2021  ? Procedure: RIGHT/LEFT HEART CATH AND CORONARY ANGIOGRAPHY;  Surgeon: Nigel Mormon, MD;  Location: [facility];  Service: Cardiovascular;  Laterality: N/A;  ? TEE WITHOUT CARDIOVERSION N/A 04/26/2021  ? Procedure: TRANSESOPHAGEAL ECHOCARDIOGRAM (TEE);  Surgeon: Geralynn Rile, MD;  Location: [place];  Service: Cardiovascular;  Laterality: N/A;  ?  ?Social History: ?Social History  ? ?Socioeconomic History  ? Marital status: Married  ?  Spouse name: Wilman Tucker  ? Number of children: 2  ? Years of education: Not on file  ? Highest education level: 12th grade  ?Occupational History  ? Occupation: Retired  ?  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  ?Tobacco Use  ? Smoking status: Never  ?  Passive exposure: Past (25 years ago quit, smoked for 25 years.)  ? Smokeless tobacco: Never  ?Vaping Use  ? Vaping Use: Never used  ?Substance and Sexual Activity  ? Alcohol use: No  ? Drug use: No  ? Sexual activity: Not on file  ?Other Topics Concern  ? Not on file  ?Social History Narrative  ? Not on file  ? ?Social Determinants of Health  ? ?Financial Resource Strain: Low Risk   ? Difficulty of Paying Living Expenses: Not hard at all  ?Food Insecurity: No Food Insecurity  ? Worried About Charity fundraiser in the Last Year: Never true  ? Ran Out of Food in the Last Year: Never true  ?Transportation Needs: No Transportation Needs  ? Lack of Transportation (Medical): No  ? Lack of Transportation (Non-Medical): No  ?Physical Activity: Not on file  ?Stress: Not on file  ?Social Connections: Not on file  ?Intimate Partner Violence: Not on file  ?  ? ?(Not in a hospital admission) ? ? ?Review of Systems  ?Constitutional: Negative for weight gain.  ?Cardiovascular:  Positive for dyspnea on exertion and leg swelling. Negative for chest pain, palpitations and syncope.  ?Gastrointestinal:  Positive for nausea.  ?  ? ?Physical Exam: ?Physical Exam ?Vitals and nursing note reviewed.   ?Constitutional:   ?   General: He is not in acute distress. ?Neck:  ?   Vascular: No JVD.  ?Cardiovascular:  ?   Rate and Rhythm: Normal rate and regular rhythm.  ?   Heart sounds: Normal heart sounds. No murmur heard. ?Pulmonary:  ?   Effort: Respiratory distress (Mild) present.  ?   Breath sounds: Normal breath sounds. Decreased air movement (Especially on the right) present. No wheezing or rales.  ?Musculoskeletal:  ?   Right lower leg: Edema (2+) present.  ?   Left lower leg: Edema (2+) present.  ? ? ? ?  ?Lab Results: ?Reviewed and interpreted: ?NT-proBNP 50,000 ?Trop at bedtime flat 30, 30 ? ?Cardiac Studies: ? ?Telemetry 07/06/2021: ?Significant arrhythmia ? ?EKG 07/06/2021: ?Ectopic atrial rhythm, old inferior MI. ? ?Imaging/tests reviewed and independently interpreted: ?Echocardiogram 06/19/2021: ? 1. Left ventricular ejection fraction, by estimation, is 25 to 30%. The  ?  left ventricle has severely decreased function. The left ventricle  ?demonstrates regional wall motion abnormalities (see scoring  ?diagram/findings for description). Left ventricular  ?diastolic function could not be evaluated. There is severe hypokinesis of  ?the left ventricular, mid-apical anteroseptal wall, anterior wall and  ?apical segment.  ? 2. Right ventricular systolic function is mildly reduced. The right  ?ventricular size is normal. There is normal pulmonary artery systolic  ?pressure.  ? 3. Left atrial size was mildly dilated.  ? 4. Right atrial size was mildly dilated.  ? 5. The mitral valve is abnormal. Moderate mitral valve regurgitation.  ?Moderate mitral stenosis. Moderate mitral annular calcification. Mean PG 9  ?mmHg, MVA 1.34 cm2 by PHT method, likely more prominent due to resting  ?tachycardia around 104 bpm.  ? 6. Tricuspid valve regurgitation is mild to moderate.  ? 7. The aortic valve is tricuspid. There is moderate calcification of the  ?aortic valve. Aortic valve regurgitation is moderate. Aortic valve area,  ?by  VTI measures 0.87 cm?Marland Kitchen Aortic valve mean gradient measures 22.0 mmHg.  ?Aortic valve Vmax measures 3.14  ?m/s. Dimensionless index 0.29.  ? 8. Moderate pleural effusion.  ? 9. Compared to previous transthoracic

## 2021-07-06 NOTE — Telephone Encounter (Signed)
Pt insurance is active and benefits verified through [employer] $20, DED 0/0 met, out of pocket $4,000/$3,880.94 met, co-insurance 0%. no pre-authorization required. [employer], 07/06/2021@3 :40pm, REF# [id number] ?

## 2021-07-06 NOTE — ED Notes (Signed)
Pt provided a water and bagged lunch per provider  ?

## 2021-07-06 NOTE — H&P (View-Only) (Signed)
CARDIOLOGY CONSULT NOTE  ?Patient ID: ?Zachary [name] ?MRN: [id number] ?DOB/AGE: 08/14/1945 76 y.o. ? ?Admit date: 07/06/2021 ?Referring Physician: [facility] hospitalist ?Primary Physician: [place] ?Reason for Consultation: Heart failure ? ?HPI:  ? ?76 y.o. Caucasian male  with hypertension, uncontrolled type 2 diabetes mellitus, peripheral artery disease, h/o osteomyelitis s/p right foot toe amputation, nonhealing wound left foot toe, h/o epidural abscess and MSSA bacteremia 04/2021, critical limb ischemia, moderate mitral and aortic stenosis, admitted with acute on chronic systolic heart failure ? ?Patient was recently discharged after hospitalization for heart failure, during which she also underwent complex PCI to his right coronary artery, with residual disease in left circumflex to be treated in a staged manner subject to renal function.  Patient had been doing better in the initial days after his discharge.  On 07/02/2021, he was seen by his PCP.  His creatinine was noted to be elevated at 2.9.  His torsemide was held.  Repeat creatinine yesterday was reduced to 2.48, but proBNP was severely elevated at 50,000.  Patient has been having worsening dyspnea, associated with nausea symptoms.  He denies any chest pain.  Patient's first question to me is "when can I go home?". ? ? ? ? ?Past Medical History:  ?Diagnosis Date  ? AKI (acute kidney injury) ([place]) 06/18/2021  ? Diabetes mellitus   ? Hypercholesteremia   ? Hypertension   ? Patella fracture   ? left  ? PVD (peripheral vascular disease) ([place])   ?  ? ?Past Surgical History:  ?Procedure Laterality Date  ? AMPUTATION    ? AMPUTATION  11/28/2011  ? Procedure: AMPUTATION RAY;  Surgeon: [name], MD;  [facility];  Service: Orthopedics;  Laterality: Left;  left 2nd toe amputation  ? AMPUTATION TOE Right 09/14/2019  ? Procedure: AMPUTATION TOE RIGHT SECOND TOE;  Surgeon: [name], [name];  Location[facility];  Service: Podiatry;   Laterality: Right;  ? AMPUTATION TOE Right 04/20/2021  ? Procedure: AMPUTATION GREAT TOE; APPLICATION OF SKIN GRAFT SUBSTITUTE;  Surgeon: [name], [name];  Location: [place] [facility];  Service: Podiatry;  Laterality: Right;  ? BUBBLE STUDY  04/26/2021  ? Procedure: BUBBLE STUDY;  Surgeon: Geralynn Rile, MD;  Location: [place];  Service: Cardiovascular;;  ? CORONARY ATHERECTOMY N/A 06/25/2021  ? Procedure: CORONARY ATHERECTOMY;  Surgeon: Nigel Mormon, MD;  Location: [place] [facility];  Service: Cardiovascular;  Laterality: N/A;  ? CORONARY STENT INTERVENTION N/A 06/25/2021  ? Procedure: CORONARY STENT INTERVENTION;  Surgeon: Nigel Mormon, MD;  Location: [place] [facility];  Service: Cardiovascular;  Laterality: N/A;  ? IR ANGIOGRAM EXTREMITY BILATERAL  03/03/2021  ? IR ANGIOGRAM EXTREMITY LEFT  05/14/2021  ? IR ANGIOGRAM EXTREMITY RIGHT  03/03/2021  ? IR ANGIOGRAM EXTREMITY RIGHT  04/14/2021  ? IR FEM POP ART PTA MOD SED  03/03/2021  ? IR FEM POP INC ATHEREC / STENT / PTA MOD SED  05/14/2021  ? IR INTRAVASCULAR ULTRASOUND NON CORONARY  03/03/2021  ? IR RADIOLOGIST EVAL & MGMT  09/27/2019  ? IR RADIOLOGIST EVAL & MGMT  12/18/2019  ? IR RADIOLOGIST EVAL & MGMT  02/11/2021  ? IR RADIOLOGIST EVAL & MGMT  04/09/2021  ? IR THORACENTESIS ASP PLEURAL SPACE W/IMG GUIDE  06/23/2021  ? IR TIB-PERO ART ATHEREC INC PTA MOD SED  03/03/2021  ? IR TIB-PERO ART PTA MOD SED  04/14/2021  ? IR US GUIDE VASC ACCESS LEFT  03/03/2021  ? IR US GUIDE  VASC ACCESS RIGHT  04/14/2021  ? IR US GUIDE VASC ACCESS RIGHT  05/14/2021  ? LEFT HEART CATH N/A 06/25/2021  ? Procedure: Left Heart Cath;  Surgeon: Nigel Mormon, MD;  Location: [place] [facility];  Service: Cardiovascular;  Laterality: N/A;  ? ORIF PATELLA Left 01/10/2019  ? Procedure: OPEN REDUCTION INTERNAL (ORIF) FIXATION LEFT PATELLA FRACTURE;  Surgeon: [name], MD;  Location: [place];  Service: Orthopedics;  Laterality: Left;  ? RIGHT/LEFT HEART CATH AND  CORONARY ANGIOGRAPHY N/A 06/22/2021  ? Procedure: RIGHT/LEFT HEART CATH AND CORONARY ANGIOGRAPHY;  Surgeon: Nigel Mormon, MD;  Location: [facility] [facility];  Service: Cardiovascular;  Laterality: N/A;  ? TEE WITHOUT CARDIOVERSION N/A 04/26/2021  ? Procedure: TRANSESOPHAGEAL ECHOCARDIOGRAM (TEE);  Surgeon: Geralynn Rile, MD;  Location: [place];  Service: Cardiovascular;  Laterality: N/A;  ?  ?Social History: ?Social History  ? ?Socioeconomic History  ? Marital status: Married  ?  Spouse name: Cary Lothrop  ? Number of children: 2  ? Years of education: Not on file  ? Highest education level: 12th grade  ?Occupational History  ? Occupation: Retired  ?  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  ?Tobacco Use  ? Smoking status: Never  ?  Passive exposure: Past (25 years ago quit, smoked for 25 years.)  ? Smokeless tobacco: Never  ?Vaping Use  ? Vaping Use: Never used  ?Substance and Sexual Activity  ? Alcohol use: No  ? Drug use: No  ? Sexual activity: Not on file  ?Other Topics Concern  ? Not on file  ?Social History Narrative  ? Not on file  ? ?Social Determinants of Health  ? ?Financial Resource Strain: Low Risk   ? Difficulty of Paying Living Expenses: Not hard at all  ?Food Insecurity: No Food Insecurity  ? Worried About Charity fundraiser in the Last Year: Never true  ? Ran Out of Food in the Last Year: Never true  ?Transportation Needs: No Transportation Needs  ? Lack of Transportation (Medical): No  ? Lack of Transportation (Non-Medical): No  ?Physical Activity: Not on file  ?Stress: Not on file  ?Social Connections: Not on file  ?Intimate Partner Violence: Not on file  ?  ? ?(Not in a hospital admission) ? ? ?Review of Systems  ?Constitutional: Negative for weight gain.  ?Cardiovascular:  Positive for dyspnea on exertion and leg swelling. Negative for chest pain, palpitations and syncope.  ?Gastrointestinal:  Positive for nausea.  ?  ? ?Physical Exam: ?Physical Exam ?Vitals and nursing note reviewed.   ?Constitutional:   ?   General: He is not in acute distress. ?Neck:  ?   Vascular: No JVD.  ?Cardiovascular:  ?   Rate and Rhythm: Normal rate and regular rhythm.  ?   Heart sounds: Normal heart sounds. No murmur heard. ?Pulmonary:  ?   Effort: Respiratory distress (Mild) present.  ?   Breath sounds: Normal breath sounds. Decreased air movement (Especially on the right) present. No wheezing or rales.  ?Musculoskeletal:  ?   Right lower leg: Edema (2+) present.  ?   Left lower leg: Edema (2+) present.  ? ? ? ?  ?Lab Results: ?Reviewed and interpreted: ?NT-proBNP 50,000 ?Trop at bedtime flat 30, 30 ? ?Cardiac Studies: ? ?Telemetry 07/06/2021: ?Significant arrhythmia ? ?EKG 07/06/2021: ?Ectopic atrial rhythm, old inferior MI. ? ?Imaging/tests reviewed and independently interpreted: ?Echocardiogram 06/19/2021: ? 1. Left ventricular ejection fraction, by estimation, is 25 to 30%. The  ?  left ventricle has severely decreased function. The left ventricle  ?demonstrates regional wall motion abnormalities (see scoring  ?diagram/findings for description). Left ventricular  ?diastolic function could not be evaluated. There is severe hypokinesis of  ?the left ventricular, mid-apical anteroseptal wall, anterior wall and  ?apical segment.  ? 2. Right ventricular systolic function is mildly reduced. The right  ?ventricular size is normal. There is normal pulmonary artery systolic  ?pressure.  ? 3. Left atrial size was mildly dilated.  ? 4. Right atrial size was mildly dilated.  ? 5. The mitral valve is abnormal. Moderate mitral valve regurgitation.  ?Moderate mitral stenosis. Moderate mitral annular calcification. Mean PG 9  ?mmHg, MVA 1.34 cm2 by PHT method, likely more prominent due to resting  ?tachycardia around 104 bpm.  ? 6. Tricuspid valve regurgitation is mild to moderate.  ? 7. The aortic valve is tricuspid. There is moderate calcification of the  ?aortic valve. Aortic valve regurgitation is moderate. Aortic valve area,  ?by  VTI measures 0.87 cm?Marland Kitchen Aortic valve mean gradient measures 22.0 mmHg.  ?Aortic valve Vmax measures 3.14  ?m/s. Dimensionless index 0.29.  ? 8. Moderate pleural effusion.  ? 9. Compared to previous transthoracic

## 2021-07-06 NOTE — H&P (Signed)
?History and Physical  ? ? ?Zachary [name] XBM:[id number] DOB: [birth year] DOA: 07/06/2021 ? ?PCP: [name], FNP (Confirm with patient/family/NH records and if not entered, this has to be entered at [facility] point of entry) ?Patient coming from: Home ? ?I have personally briefly reviewed patient's old medical records in [place] ? ?Chief Complaint: SOB ? ?HPI: Zachary [name] is a 76 y.o. male with medical history significant of chronic systolic CHF LVEF 02-72%, CKD stage IIIa, multivessel CAD, IDDM, diabetic neuropathy, recent osteomyelitis post antibiotic treatment, presented with increasing shortness of breath. ? ?Patient was recently hospitalized for CHF decompensation was diuresed removed about 3.5 kg and discharged home on torsemide 20 mg daily.  Last Friday, patient went to see PCP and blood work showed " kidney function bumped up", states Lasix has been held for the last 3 days.  Starting 2 days ago, patient started to experience dyspnea, gradually getting worse no cough no chest pain no fever or chills.  No significant leg swelling.  He went to see cardiology today who suspect patient has CHF decompensation and sent to the ED. ? ?ED Course: No tachycardia no hypotensive no significant hypoxia.  Chest x-ray showed lungs congested with recurrent left-sided pleural effusion. ? ?Creatinine 2.5 compared to 2.17 days ago, K5.1.  Glucose 242.  1 dose of 40 mg IV Lasix given. ? ?Review of Systems: As per HPI otherwise 14 point review of systems negative.  ? ? ?Past Medical History:  ?Diagnosis Date  ? AKI (acute kidney injury) ([place]) 06/18/2021  ? Diabetes mellitus   ? Hypercholesteremia   ? Hypertension   ? Patella fracture   ? left  ? PVD (peripheral vascular disease) ([place])   ? ? ?Past Surgical History:  ?Procedure Laterality Date  ? AMPUTATION    ? AMPUTATION  11/28/2011  ? Procedure: AMPUTATION RAY;  Surgeon: [name], MD;  Location: [facility];  Service: Orthopedics;  Laterality: Left;  left 2nd  toe amputation  ? AMPUTATION TOE Right 09/14/2019  ? Procedure: AMPUTATION TOE RIGHT SECOND TOE;  Surgeon: [name], [name];  Location: [facility];  Service: Podiatry;  Laterality: Right;  ? AMPUTATION TOE Right 04/20/2021  ? Procedure: AMPUTATION GREAT TOE; APPLICATION OF SKIN GRAFT SUBSTITUTE;  Surgeon: [name], [name];  Location: [place];  Service: Podiatry;  Laterality: Right;  ? BUBBLE STUDY  04/26/2021  ? Procedure: BUBBLE STUDY;  Surgeon: Geralynn Rile, MD;  Location: [place];  Service: Cardiovascular;;  ? CORONARY ATHERECTOMY N/A 06/25/2021  ? Procedure: CORONARY ATHERECTOMY;  Surgeon: Nigel Mormon, MD;  Location: [place] [facility];  Service: Cardiovascular;  Laterality: N/A;  ? CORONARY STENT INTERVENTION N/A 06/25/2021  ? Procedure: CORONARY STENT INTERVENTION;  Surgeon: Nigel Mormon, MD;  Location: [place] [facility];  Service: Cardiovascular;  Laterality: N/A;  ? IR ANGIOGRAM EXTREMITY BILATERAL  03/03/2021  ? IR ANGIOGRAM EXTREMITY LEFT  05/14/2021  ? IR ANGIOGRAM EXTREMITY RIGHT  03/03/2021  ? IR ANGIOGRAM EXTREMITY RIGHT  04/14/2021  ? IR FEM POP ART PTA MOD SED  03/03/2021  ? IR FEM POP INC ATHEREC / STENT / PTA MOD SED  05/14/2021  ? IR INTRAVASCULAR ULTRASOUND NON CORONARY  03/03/2021  ? IR RADIOLOGIST EVAL & MGMT  09/27/2019  ? IR RADIOLOGIST EVAL & MGMT  12/18/2019  ? IR RADIOLOGIST EVAL & MGMT  02/11/2021  ? IR RADIOLOGIST EVAL & MGMT  04/09/2021  ? IR THORACENTESIS ASP PLEURAL SPACE W/IMG  GUIDE  06/23/2021  ? IR TIB-PERO ART ATHEREC INC PTA MOD SED  03/03/2021  ? IR TIB-PERO ART PTA MOD SED  04/14/2021  ? IR US GUIDE VASC ACCESS LEFT  03/03/2021  ? IR US GUIDE VASC ACCESS RIGHT  04/14/2021  ? IR US GUIDE VASC ACCESS RIGHT  05/14/2021  ? LEFT HEART CATH N/A 06/25/2021  ? Procedure: Left Heart Cath;  Surgeon: Nigel Mormon, MD;  Location: [place] [facility];  Service: Cardiovascular;  Laterality: N/A;  ? ORIF PATELLA Left 01/10/2019  ? Procedure: OPEN REDUCTION INTERNAL (ORIF)  FIXATION LEFT PATELLA FRACTURE;  Surgeon: [name], MD;  Location: [place] [place];  Service: Orthopedics;  Laterality: Left;  ? RIGHT/LEFT HEART CATH AND CORONARY ANGIOGRAPHY N/A 06/22/2021  ? Procedure: RIGHT/LEFT HEART CATH AND CORONARY ANGIOGRAPHY;  Surgeon: Nigel Mormon, MD;  Location: [facility];  Service: Cardiovascular;  Laterality: N/A;  ? TEE WITHOUT CARDIOVERSION N/A 04/26/2021  ? Procedure: TRANSESOPHAGEAL ECHOCARDIOGRAM (TEE);  Surgeon: Geralynn Rile, MD;  Location: [place];  Service: Cardiovascular;  Laterality: N/A;  ? ? ? reports that he has never smoked. He has been exposed to tobacco smoke. He has never used smokeless tobacco. He reports that he does not drink alcohol and does not use drugs. ? ?Allergies  ?Allergen Reactions  ? Metformin Hcl Other (See Comments)  ?  Unknown reaction  ? Pravastatin Sodium Other (See Comments)  ?  Muscle pain   ? ? ?No family history on file. ? ? ?Prior to Admission medications   ?Medication Sig Start Date End Date Taking? Authorizing Provider  ?acetaminophen (TYLENOL) 500 MG tablet Take 1,000 mg by mouth See admin instructions. Take 1000 mg 3 times daily, may take 1000 mg every 8 hours as needed for mild/moderate pain    [provider]  ?aspirin EC 81 MG tablet Take 81 mg by mouth daily.    [provider]  ?B-D ULTRAFINE III SHORT PEN 31G X 8 MM MISC Inject into the skin 5 (five) times daily. 11/26/19   [provider]  ?bisoprolol (ZEBETA) 5 MG tablet Take 1/2 tablet (2.5 mg total) by mouth daily. 06/30/21   Domenic Polite, MD  ?calcium carbonate (TUMS - DOSED IN MG ELEMENTAL CALCIUM) 500 MG chewable tablet Chew 1,000 mg by mouth daily as needed for indigestion or heartburn.    [provider]  ?empagliflozin (JARDIANCE) 10 MG TABS tablet Take 1 tablet (10 mg total) by mouth daily. 06/30/21   Domenic Polite, MD  ?famotidine (PEPCID) 20 MG tablet Take 20 mg by mouth every evening.     [provider]  ?glucose blood (FREESTYLE LITE) test strip TEST 3 TIMES A DAY E11.9    [provider]  ?insulin glargine (LANTUS SOLOSTAR) 100 UNIT/ML Solostar Pen Inject 16 Units into the skin at bedtime. 06/29/21   Domenic Polite, MD  ?isosorbide-hydrALAZINE (BIDIL) 20-37.5 MG tablet Take 1/2 tablet by mouth 3 (three) times daily. 06/29/21   Domenic Polite, MD  ?Lancets (FREESTYLE) lancets AS DIRECTED THREE TIMES A DAY    [provider]  ?nitroGLYCERIN (NITROSTAT) 0.4 MG SL tablet Place 0.4 mg under the tongue every 5 (five) minutes as needed for chest pain.    [provider]  ?pantoprazole (PROTONIX) 40 MG tablet Take 40 mg by mouth daily.    [provider]  ?polyethylene glycol (MIRALAX / GLYCOLAX) 17 g packet Take 17 g by mouth 2 (two) times daily. 04/28/21  Debbe Odea, MD  ?Potassium Chloride ER 20 MEQ TBCR Take 20 mEq by mouth daily. 06/29/21   Domenic Polite, MD  ?rosuvastatin (CRESTOR) 20 MG tablet Take 1 tablet (20 mg total) by mouth daily. 06/30/21   Domenic Polite, MD  ?ticagrelor (BRILINTA) 90 MG TABS tablet Take 1 tablet (90 mg total) by mouth 2 (two) times daily. 06/29/21   Domenic Polite, MD  ?torsemide (DEMADEX) 20 MG tablet Take 1 tablet (20 mg total) by mouth daily. 06/30/21   Domenic Polite, MD  ? ? ?Physical Exam: ?Vitals:  ? 07/06/21 1123 07/06/21 1200 07/06/21 1245 07/06/21 1315  ?BP: (!) 112/56 125/78 127/66 124/79  ?Pulse: 68 78 76 79  ?Resp: 16 (!) 27 (!) 22 (!) 28  ?Temp:      ?TempSrc:      ?SpO2: 94% 95% 96% 94%  ? ? ?Constitutional: NAD, calm, comfortable ?Vitals:  ? 07/06/21 1123 07/06/21 1200 07/06/21 1245 07/06/21 1315  ?BP: (!) 112/56 125/78 127/66 124/79  ?Pulse: 68 78 76 79  ?Resp: 16 (!) 27 (!) 22 (!) 28  ?Temp:      ?TempSrc:      ?SpO2: 94% 95% 96% 94%  ? ?Eyes: PERRL, lids and conjunctivae normal ?ENMT: Mucous membranes are moist. Posterior pharynx clear of any exudate or lesions.Normal dentition.  ?Neck: normal, supple, no  masses, no thyromegaly ?Respiratory: Diminished breathing sound on left lower field, no wheezing, fine crackles on bilateral bases. Normal respiratory effort. No accessory muscle use.  ?Cardiovascular: Reg

## 2021-07-07 ENCOUNTER — Ambulatory Visit: Payer: Medicare PPO | Admitting: Cardiology

## 2021-07-07 ENCOUNTER — Telehealth (HOSPITAL_COMMUNITY): Payer: Self-pay | Admitting: Radiology

## 2021-07-07 ENCOUNTER — Encounter (HOSPITAL_COMMUNITY)
Admission: EM | Disposition: A | Discharge status: Hospice | Payer: Self-pay | Source: Home / Self Care | Attending: Family Medicine

## 2021-07-07 ENCOUNTER — Inpatient Hospital Stay (HOSPITAL_COMMUNITY): Payer: Medicare PPO

## 2021-07-07 DIAGNOSIS — Z66 Do not resuscitate: Secondary | ICD-10-CM | POA: Diagnosis not present

## 2021-07-07 DIAGNOSIS — L8990 Pressure ulcer of unspecified site, unspecified stage: Secondary | ICD-10-CM | POA: Insufficient documentation

## 2021-07-07 DIAGNOSIS — I5023 Acute on chronic systolic (congestive) heart failure: Secondary | ICD-10-CM | POA: Diagnosis not present

## 2021-07-07 DIAGNOSIS — Z515 Encounter for palliative care: Secondary | ICD-10-CM

## 2021-07-07 DIAGNOSIS — Z7189 Other specified counseling: Secondary | ICD-10-CM | POA: Diagnosis not present

## 2021-07-07 DIAGNOSIS — N179 Acute kidney failure, unspecified: Secondary | ICD-10-CM | POA: Diagnosis not present

## 2021-07-07 DIAGNOSIS — N289 Disorder of kidney and ureter, unspecified: Secondary | ICD-10-CM

## 2021-07-07 DIAGNOSIS — I5021 Acute systolic (congestive) heart failure: Secondary | ICD-10-CM | POA: Diagnosis not present

## 2021-07-07 DIAGNOSIS — E1165 Type 2 diabetes mellitus with hyperglycemia: Secondary | ICD-10-CM | POA: Diagnosis not present

## 2021-07-07 HISTORY — PX: RIGHT HEART CATH: CATH118263

## 2021-07-07 LAB — CBC
HCT: 38.3 % — ABNORMAL LOW (ref 39.0–52.0)
Hemoglobin: 12.4 g/dL — ABNORMAL LOW (ref 13.0–17.0)
MCH: 27.6 pg (ref 26.0–34.0)
MCHC: 32.4 g/dL (ref 30.0–36.0)
MCV: 85.3 fL (ref 80.0–100.0)
Platelets: 248 10*3/uL (ref 150–400)
RBC: 4.49 MIL/uL (ref 4.22–5.81)
RDW: 13.7 % (ref 11.5–15.5)
WBC: 7.6 10*3/uL (ref 4.0–10.5)
nRBC: 0 % (ref 0.0–0.2)

## 2021-07-07 LAB — POCT I-STAT EG7
Acid-Base Excess: 2 mmol/L (ref 0.0–2.0)
Acid-Base Excess: 2 mmol/L (ref 0.0–2.0)
Bicarbonate: 26.1 mmol/L (ref 20.0–28.0)
Bicarbonate: 26.6 mmol/L (ref 20.0–28.0)
Calcium, Ion: 1.19 mmol/L (ref 1.15–1.40)
Calcium, Ion: 1.19 mmol/L (ref 1.15–1.40)
HCT: 36 % — ABNORMAL LOW (ref 39.0–52.0)
HCT: 36 % — ABNORMAL LOW (ref 39.0–52.0)
Hemoglobin: 12.2 g/dL — ABNORMAL LOW (ref 13.0–17.0)
Hemoglobin: 12.2 g/dL — ABNORMAL LOW (ref 13.0–17.0)
O2 Saturation: 65 %
O2 Saturation: 68 %
Potassium: 4.4 mmol/L (ref 3.5–5.1)
Potassium: 4.4 mmol/L (ref 3.5–5.1)
Sodium: 136 mmol/L (ref 135–145)
Sodium: 136 mmol/L (ref 135–145)
TCO2: 27 mmol/L (ref 22–32)
TCO2: 28 mmol/L (ref 22–32)
pCO2, Ven: 39.4 mmHg — ABNORMAL LOW (ref 44–60)
pCO2, Ven: 39.8 mmHg — ABNORMAL LOW (ref 44–60)
pH, Ven: 7.429 (ref 7.25–7.43)
pH, Ven: 7.434 — ABNORMAL HIGH (ref 7.25–7.43)
pO2, Ven: 33 mmHg (ref 32–45)
pO2, Ven: 34 mmHg (ref 32–45)

## 2021-07-07 LAB — BASIC METABOLIC PANEL
Anion gap: 13 (ref 5–15)
BUN: 40 mg/dL — ABNORMAL HIGH (ref 8–23)
CO2: 22 mmol/L (ref 22–32)
Calcium: 8.8 mg/dL — ABNORMAL LOW (ref 8.9–10.3)
Chloride: 100 mmol/L (ref 98–111)
Creatinine, Ser: 2.72 mg/dL — ABNORMAL HIGH (ref 0.61–1.24)
GFR, Estimated: 23 mL/min — ABNORMAL LOW (ref >60)
Glucose, Bld: 144 mg/dL — ABNORMAL HIGH (ref 70–99)
Potassium: 4.4 mmol/L (ref 3.5–5.1)
Sodium: 135 mmol/L (ref 135–145)

## 2021-07-07 LAB — GLUCOSE, CAPILLARY
Glucose-Capillary: 142 mg/dL — ABNORMAL HIGH (ref 70–99)
Glucose-Capillary: 130 mg/dL — ABNORMAL HIGH (ref 70–99)
Glucose-Capillary: 241 mg/dL — ABNORMAL HIGH (ref 70–99)
Glucose-Capillary: 209 mg/dL — ABNORMAL HIGH (ref 70–99)

## 2021-07-07 SURGERY — RIGHT HEART CATH

## 2021-07-07 MED ORDER — METHOCARBAMOL 500 MG PO TABS
500.0000 mg | ORAL_TABLET | Freq: Three times a day (TID) | ORAL | Status: DC | PRN
  Administered 2021-07-07: 500 mg via ORAL
  Filled 2021-07-07: qty 1

## 2021-07-07 MED ORDER — HEPARIN SODIUM (PORCINE) 5000 UNIT/ML IJ SOLN
5000.0000 [IU] | Freq: Three times a day (TID) | INTRAMUSCULAR | Status: DC
  Administered 2021-07-07 – 2021-07-08 (×2): 5000 [IU] via SUBCUTANEOUS
  Filled 2021-07-07: qty 1

## 2021-07-07 MED ORDER — SODIUM CHLORIDE 0.9 % IV SOLN: 250.0000 mL | INTRAVENOUS | Status: DC | PRN

## 2021-07-07 MED ORDER — ALPRAZOLAM 0.25 MG PO TABS
0.2500 mg | ORAL_TABLET | Freq: Three times a day (TID) | ORAL | Status: DC | PRN
  Administered 2021-07-07: 0.25 mg via ORAL
  Filled 2021-07-07: qty 1

## 2021-07-07 MED ORDER — LABETALOL HCL 5 MG/ML IV SOLN: 10.0000 mg | INTRAVENOUS | Status: AC | PRN

## 2021-07-07 MED ORDER — ASPIRIN 81 MG PO CHEW: 81.0000 mg | CHEWABLE_TABLET | ORAL | Status: DC

## 2021-07-07 MED ORDER — HEPARIN (PORCINE) IN NACL 1000-0.9 UT/500ML-% IV SOLN
INTRAVENOUS | Status: DC | PRN
  Administered 2021-07-07: 500 mL

## 2021-07-07 MED ORDER — LIDOCAINE HCL (PF) 1 % IJ SOLN
INTRAMUSCULAR | Status: AC
  Filled 2021-07-07: qty 30

## 2021-07-07 MED ORDER — SENNA 8.6 MG PO TABS: 1.0000 | ORAL_TABLET | Freq: Every day | ORAL | Status: DC | PRN

## 2021-07-07 MED ORDER — HYDRALAZINE HCL 20 MG/ML IJ SOLN: 10.0000 mg | INTRAMUSCULAR | Status: AC | PRN

## 2021-07-07 MED ORDER — ACETAMINOPHEN 325 MG PO TABS
650.0000 mg | ORAL_TABLET | ORAL | Status: DC | PRN
  Administered 2021-07-07: 650 mg via ORAL
  Filled 2021-07-07: qty 2

## 2021-07-07 MED ORDER — HEPARIN (PORCINE) IN NACL 1000-0.9 UT/500ML-% IV SOLN
INTRAVENOUS | Status: AC
  Filled 2021-07-07: qty 500

## 2021-07-07 MED ORDER — SODIUM CHLORIDE 0.9% FLUSH: 3.0000 mL | INTRAVENOUS | Status: DC | PRN

## 2021-07-07 MED ORDER — ONDANSETRON HCL 4 MG/2ML IJ SOLN: 4.0000 mg | Freq: Four times a day (QID) | INTRAMUSCULAR | Status: DC | PRN

## 2021-07-07 MED ORDER — ASPIRIN 81 MG PO CHEW
81.0000 mg | CHEWABLE_TABLET | ORAL | Status: AC
  Administered 2021-07-07: 81 mg via ORAL
  Filled 2021-07-07: qty 1

## 2021-07-07 MED ORDER — SODIUM CHLORIDE 0.9% FLUSH
3.0000 mL | Freq: Two times a day (BID) | INTRAVENOUS | Status: DC
  Administered 2021-07-07 – 2021-07-08 (×3): 3 mL via INTRAVENOUS

## 2021-07-07 MED ORDER — LIDOCAINE HCL (PF) 1 % IJ SOLN
INTRAMUSCULAR | Status: DC | PRN
  Administered 2021-07-07: 2 mL

## 2021-07-07 MED ORDER — MORPHINE SULFATE (CONCENTRATE) 10 MG/0.5ML PO SOLN: 5.0000 mg | ORAL | Status: DC | PRN

## 2021-07-07 MED ORDER — SODIUM CHLORIDE 0.9 % IV SOLN: INTRAVENOUS | Status: DC

## 2021-07-07 MED ORDER — MUSCLE RUB 10-15 % EX CREA
TOPICAL_CREAM | CUTANEOUS | Status: DC | PRN
  Filled 2021-07-07: qty 85

## 2021-07-07 MED ORDER — SODIUM CHLORIDE 0.9 % IV SOLN
INTRAVENOUS | Status: DC
  Administered 2021-07-07: 1000 mL via INTRAVENOUS

## 2021-07-07 SURGICAL SUPPLY — 5 items
CATH BALLN WEDGE 5F 110CM (CATHETERS) ×1 IMPLANT
PACK CARDIAC CATHETERIZATION (CUSTOM PROCEDURE TRAY) ×1 IMPLANT
SHEATH GLIDE SLENDER 4/5FR (SHEATH) ×1 IMPLANT
TRANSDUCER W/STOPCOCK (MISCELLANEOUS) ×1 IMPLANT
TUBING ART PRESS 72  MALE/MALE (TUBING) ×1 IMPLANT

## 2021-07-07 NOTE — Interval H&P Note (Signed)
History and Physical Interval Note: ? ?07/07/2021 ?8:32 AM ? ?Zachary [name]  has presented today for surgery, with the diagnosis of heart failure.  The various methods of treatment have been discussed with the patient and family. After consideration of risks, benefits and other options for treatment, the patient has consented to  Procedure(s): ?RIGHT HEART CATH (N/A) as a surgical intervention.  The patient's history has been reviewed, patient examined, no change in status, stable for surgery.  I have reviewed the patient's chart and labs.  Questions were answered to the patient's satisfaction.   ? ?2012 Appropriate Use Criteria for Diagnostic Catheterization ?Cardiomyopathies ?(Right and Left Heart Catheterization OR ?Right Heart Catheterization Alone With/Without Left Ventriculography and Coronary Angiography) ?Indication: ? 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 ?[place] ? ? ?

## 2021-07-07 NOTE — Plan of Care (Signed)

## 2021-07-07 NOTE — TOC Transition Note (Signed)
Transition of Care (TOC) - CM/SW Discharge Note ? ? ?Patient Details  ?Name: Zachary [name] ?MRN: [id number] ?Date of Birth: [birth year] ? ?Transition of Care (TOC) CM/SW Contact:  ?Zenon Mayo, RN ?Phone Number: ?07/07/2021, 2:08 PM ? ? ?Clinical Narrative:    ?Plan for dc tomorrow, Fabio Pierce is ordering the DME for patient, hospital bed, BSC, wchair and oxygen..  Plan is for dc tomorrow. He will go home by car per patient ans wife.  DNR form is on the chart. ? ? ?Final next level of care: [place] ?Barriers to Discharge: Continued Medical Work up ? ? ?Patient Goals and CMS Choice ?Patient states their goals for this hospitalization and ongoing recovery are:: return home with hospice ?CMS Medicare.gov Compare Post Acute Care list provided to:: Patient Represenative (must comment) ?Choice offered to / list presented to : Spouse ? ?Discharge Placement ?  ?           ?  ?  ?  ?  ? ?Discharge Plan and Services ?  ?  ?           ?DME Arranged:  (Hospice will supply DME) ?  ?  ?  ?  ?HH Arranged: RN ?[place] Agency:  Teacher, early years/pre) ?Date HH Agency Contacted: 07/07/21 ?Time [place]: 2542 ?Representative spoke with at [place]: Fabio Pierce ? ?Social Determinants of Health (SDOH) Interventions ?  ? ? ?Readmission Risk Interventions ?   ? View : No data to display.  ?  ?  ?  ? ? ? ? ? ?

## 2021-07-07 NOTE — Progress Notes (Signed)
?  Progress Note ? ? ?Patient: Zachary [name] RFX:[id number] DOB: 1946-03-03 DOA: 07/06/2021     1 ?DOS: the patient was seen and examined on 07/07/2021 ?  ?Brief hospital course: ?76 year old man presented with increasing shortness of breath.  Admitted for acute on chronic systolic CHF, AKI.  Seen by cardiology.  Right heart catheterization 3/29. Seen by palliative care, CODE STATUS updated DNR, plan for home with hospice. ? ?Assessment and Plan: ?Acute on chronic systolic CHF decompensation ?--Status post right heart cath, continue aspirin, BiDil, Brilinta.  No more labs.  Treat symptoms. ?  ?AKI on CKD stage IIIa secondary to cardiorenal syndrome. ?--Plan Home with hospice.  No more labs. ?  ?CAD, multivessel disease ?--Not a candidate for CABG as per CT surgery evaluation on last admission. ?  ?Recent great toe osteomyelitis ?-Status post antibiotic treatment 3 weeks ago ?  ?PVD ?-On aspirin and Brilinta.  ?  ?IDDM with hyperglycemia ?-Continue Jardiance and Lantus, sliding scale. ? ? ? ?  ? ?Subjective:  ?Feels ok ?Breathing short at times ? ?Physical Exam: ?Vitals:  ? 07/07/21 1200 07/07/21 1300 07/07/21 1400 07/07/21 1625  ?BP: 106/79 (!) 95/52 (!) 92/54 (!) 118/55  ?Pulse: 82 79 84 88  ?Resp: (!) 22 19 (!) 21 19  ?Temp:    97.6 ?F (36.4 ?C)  ?TempSrc:    Oral  ?SpO2:    98%  ?Weight:      ?Height:      ? ?Physical Exam ?Vitals reviewed.  ?Constitutional:   ?   General: He is not in acute distress. ?   Appearance: He is not ill-appearing or toxic-appearing.  ?Cardiovascular:  ?   Rate and Rhythm: Normal rate and regular rhythm.  ?   Heart sounds: No murmur heard. ?Pulmonary:  ?   Effort: No respiratory distress.  ?   Breath sounds: No wheezing or rales.  ?   Comments: Mild increased respiratory effort ?Neurological:  ?   Mental Status: He is alert.  ?Psychiatric:     ?   Mood and Affect: Mood normal.     ?   Behavior: Behavior normal.  ? ? ?Data Reviewed: ? ?UOP 2550 ?CBG stable ?K+ WNL ?Hgb stable 12.2 ? ?Family  Communication: none ? ?Disposition: ?Status is: Inpatient ?Remains inpatient appropriate because: plan for home with hospice 3/30 ? ? Planned Discharge Destination: Home with hospice ? ? ? ?Time spent: 20 minutes ? ?Author: ?Murray Hodgkins, MD ?07/07/2021 7:33 PM ? ?For on call review [email or web address].  ?

## 2021-07-07 NOTE — Consult Note (Signed)
? ?                                                                                ?Consultation Note ?Date: 07/07/2021  ? ?Patient Name: Zachary [name]  ?DOB: [birth year]  MRN: [id number]  Age / Sex: 76 y.o., male  ?PCP: [name], FNP ?Referring Physician: Samuella Cota, MD ? ?Reason for Consultation: Establishing goals of care ? ?HPI/Patient Profile: 76 y.o. male  with past medical history of chronic systolic CHF EF 38-25%, CKD stage 3a, multivessel CAD, IDDM, diabetic neuropathy, recent osteomyelitis s/p antibiotics treatment, PVD s/p L foot toe amputation (plans for extended amputation with nonhealing foot wound), h/o epidural abscess and MSSA bacteremia, critical limb ischemia admitted on 07/06/2021 with shortness of breath related to acute heart failure exacerbation and decompensation along with worsening kidney function. Not a candidate for CABG or PCI interventions. Of note, he has DNR record from Jan 2023 loaded in electronic records.  ? ?Clinical Assessment and Goals of Care: ?I met today with Mr. Needs, wife Zachary [name] (of 50+ years), son Zachary [name] at bedside. Records reviewed for progressing heart failure, renal failure, and underlying complications of vascular progression with wound from recent toe amputations. [name] is sitting on side of bed when I come to speak with them. He is very clear that his desire is to go home and family open to discussing the details of making this happen. We reviewed his complicated health issues and they are all aware of limited options and poor prognosis. Goals are clarified to proceed with getting Mr. Parco back into his home where he can spend the rest of his days surrounded by his family. He does not wish to come back to the hospital but to focus on comfort and quality of life. He does not wish for resuscitative efforts and elects DNR. We discussed support at home and recommendation of hospice since this best aligns with his goals of care for comfort at  home. Family at bedside supportive of decisions. They would like to proceed with home with hospice tomorrow. Their other son is en-route from [place] and should be arriving tonight to assist with transition and be here with his family.  ? ?We further discussed hospice philosophy and support. We discussed symptom management. We discussed minimizing medications and liberalizing diet. Mr. Montenegro very happily (and quickly) placed his order for a cheeseburger! He is having significant neck pain (musculoskeletal - some relief with heat pack when he leans back but uncomfortably leaning back with his shortness of breath and prefers sitting up). Orders placed for muscle cream, roxanol (this will be beneficial for his shortness of breath too - discussed with them use of roxanol at home), and muscle relaxer if previous measures ineffective. They will need bed, oxygen, BSC, and wheelchair for at home. Hopeful for return home tomorrow. Discussed with them potential of hospice facility if needed in the future. Discussed foot dressing changes as needed and hospice can assist when present. Encouraged that he prioritize his energy for things that matter like spending time with his family.  ? ?All questions/concerns addressed. Emotional support provided. Updated TOC and medical team.  ? ?Primary Decision Maker ?  PATIENT ?  ? ?SUMMARY OF RECOMMENDATIONS   ?- DNR ?- Home with hospice ?- Symptom management recs below ?- No more labs desired ? ?Code Status/Advance Care Planning: ?DNR ? ? ?Symptom Management:  ?Shortness of breath/pain: Roxanol SL 5-10 mg every 2 hours. May scheduled titrate as needed - hospice to manage at home.  ?Musculoskeletal pain: Heat pack, muscle cream, robaxin prn.  ?Anxiety/sleep: Xanax prn.  ?Constipation: Senokot daily prn.  ? ?Palliative Prophylaxis:  ?Aspiration, Bowel Regimen, Delirium Protocol, Frequent Pain Assessment, Oral Care, and Turn Reposition ? ?Additional Recommendations (Limitations, Scope,  Preferences): ?Full Comfort Care ? ?Prognosis:  ?Weeks likely.  ? ?Discharge Planning: Home with Hospice  ? ?  ? ?Primary Diagnoses: ?Present on Admission: ? Acute systolic CHF (congestive heart failure) ([place]) ? AKI (acute kidney injury) ([place]) ? ? ?I have reviewed the medical record, interviewed the patient and family, and examined the patient. The following aspects are pertinent. ? ?Past Medical History:  ?Diagnosis Date  ? AKI (acute kidney injury) ([place]) 06/18/2021  ? Diabetes mellitus   ? Hypercholesteremia   ? Hypertension   ? Patella fracture   ? left  ? PVD (peripheral vascular disease) ([place])   ? ?Social History  ? ?Socioeconomic History  ? Marital status: Married  ?  Spouse name: Kullen Tomasetti  ? Number of children: 2  ? Years of education: Not on file  ? Highest education level: 12th grade  ?Occupational History  ? Occupation: Retired  ?  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  ?Tobacco Use  ? Smoking status: Never  ?  Passive exposure: Past (25 years ago quit, smoked for 25 years.)  ? Smokeless tobacco: Never  ?Vaping Use  ? Vaping Use: Never used  ?Substance and Sexual Activity  ? Alcohol use: No  ? Drug use: No  ? Sexual activity: Not on file  ?Other Topics Concern  ? Not on file  ?Social History Narrative  ? Not on file  ? ?Social Determinants of Health  ? ?Financial Resource Strain: Low Risk   ? Difficulty of Paying Living Expenses: Not hard at all  ?Food Insecurity: No Food Insecurity  ? Worried About Charity fundraiser in the Last Year: Never true  ? Ran Out of Food in the Last Year: Never true  ?Transportation Needs: No Transportation Needs  ? Lack of Transportation (Medical): No  ? Lack of Transportation (Non-Medical): No  ?Physical Activity: Not on file  ?Stress: Not on file  ?Social Connections: Not on file  ? ?No family history on file. ?Scheduled Meds: ? aspirin EC  81 mg Oral Daily  ? bisoprolol  2.5 mg Oral Daily  ? heparin  5,000 Units Subcutaneous Q8H  ? insulin aspart  0-9 Units Subcutaneous TID WC  ?  insulin glargine-yfgn  16 Units Subcutaneous QHS  ? isosorbide-hydrALAZINE  0.5 tablet Oral TID  ? lactose free nutrition  237 mL Oral Q24H  ? pantoprazole  40 mg Oral Daily  ? polyethylene glycol  17 g Oral BID  ? rosuvastatin  20 mg Oral Daily  ? sodium chloride flush  3 mL Intravenous Q12H  ? sodium chloride flush  3 mL Intravenous Q12H  ? sodium chloride flush  3 mL Intravenous Q12H  ? ticagrelor  90 mg Oral BID  ? ?Continuous Infusions: ? sodium chloride    ? sodium chloride    ? ?PRN Meds:.sodium chloride, sodium chloride, acetaminophen, hydrALAZINE, labetalol, ondansetron (ZOFRAN) IV, sodium chloride flush, sodium chloride flush ?Allergies  ?  Allergen Reactions  ? Metformin Hcl Other (See Comments)  ?  Unknown reaction  ? Pravastatin Sodium Other (See Comments)  ?  Muscle pain   ? ?Review of Systems  ?Constitutional:  Positive for activity change and fatigue.  ?Respiratory:  Positive for shortness of breath.   ?Cardiovascular:  Negative for chest pain.  ?Gastrointestinal:  Negative for constipation.  ?Musculoskeletal:  Positive for neck pain.  ?Neurological:  Positive for weakness.  ? ?Physical Exam ?Vitals and nursing note reviewed.  ?Constitutional:   ?   General: He is not in acute distress. ?   Appearance: He is ill-appearing.  ?   Comments: Poor reserve  ?Cardiovascular:  ?   Rate and Rhythm: Normal rate.  ?Pulmonary:  ?   Effort: No tachypnea, accessory muscle usage or respiratory distress.  ?   Comments: Shortness of breath at rest ?Abdominal:  ?   Palpations: Abdomen is soft.  ?Neurological:  ?   Mental Status: He is alert and oriented to person, place, and time.  ? ? ?Vital Signs: BP 126/68   Pulse 82   Temp (!) 97.4 ?F (36.3 ?C)   Resp (!) 22   Ht _0  (1.727 m)   Wt 75.3 kg   SpO2 100%   BMI 25.26 kg/m?  ?Pain Scale: 0-10 ?  ?Pain Score: 0-No pain ? ? ?SpO2: SpO2: 100 % ?O2 Device:SpO2: 100 % ?O2 Flow Rate: .O2 Flow Rate (L/min): 2 L/min ? ?IO: Intake/output summary:  ?Intake/Output Summary  (Last 24 hours) at 07/07/2021 1023 ?Last data filed at 07/07/2021 0700 ?Gross per 24 hour  ?Intake 0 ml  ?Output 2550 ml  ?Net -2550 ml  ? ? ?LBM: Last BM Date : 07/04/21 ?Baseline Weight: Weight: 75.7 kg ?Most recent w

## 2021-07-07 NOTE — Telephone Encounter (Signed)
Pt and family have spoken with cardiology concerning palliative care and would like to proceed with having a consult as quickly as possible. JM ?

## 2021-07-07 NOTE — TOC Progression Note (Signed)
Transition of Care (TOC) - Progression Note  ? ? ?Patient Details  ?Name: Zachary [name] ?MRN: [id number] ?Date of Birth: 1945/11/25 ? ?Transition of Care (TOC) CM/SW Contact  ?Zenon Mayo, RN ?Phone Number: ?07/07/2021, 1:57 PM ? ?Clinical Narrative:    ?NCM spoke with wife and patient at bedside. They would like [place], will go home via car.  He will need oxygen, hospital bed, w/chair, and bsc.  NCM made referral to Roselee Nova with [facility].  ? ? ?  ?  ? ?Expected Discharge Plan and Services ?  ?  ?  ?  ?  ?                ?  ?  ?  ?  ?  ?  ?  ?  ?  ?  ? ? ?Social Determinants of Health (SDOH) Interventions ?  ? ?Readmission Risk Interventions ?   ? View : No data to display.  ?  ?  ?  ? ? ?

## 2021-07-07 NOTE — Progress Notes (Signed)
Heart Failure Navigator Progress Note ? ?Assessed for Heart & Vascular TOC clinic readiness.  ?Patient does not meet criteria due to patient is going home 07/08/21 with Home Hospice..  ? ? ? ?Earnestine Leys, BSN, RN ?Heart Failure Nurse Navigator ?[phone]   ?

## 2021-07-07 NOTE — TOC Initial Note (Signed)
Transition of Care (TOC) - Initial/Assessment Note  ? ? ?Patient Details  ?Name: Zachary [name] ?MRN: [id number] ?Date of Birth: 04-05-1946 ? ?Transition of Care (TOC) CM/SW Contact:    ?Zenon Mayo, RN ?Phone Number: ?07/07/2021, 2:12 PM ? ?Clinical Narrative:                 ?NCM spoke with wife and patient at bedside. They would like [place], will go home via car.  He will need oxygen, hospital bed, w/chair, and bsc.  NCM made referral to Roselee Nova with [facility].  ?  ? ?  ?Barriers to Discharge: Continued Medical Work up ? ? ?Patient Goals and CMS Choice ?Patient states their goals for this hospitalization and ongoing recovery are:: return home with hospice ?CMS Medicare.gov Compare Post Acute Care list provided to:: Patient Represenative (must comment) ?Choice offered to / list presented to : Spouse ? ?Expected Discharge Plan and Services ?  ?  ?  ?  ?  ?                ?DME Arranged:  (Hospice will supply DME) ?  ?  ?  ?  ?HH Arranged: RN ?[place] Agency:  Teacher, early years/pre) ?Date HH Agency Contacted: 07/07/21 ?Time [place]: 9735 ?Representative spoke with at [place]: Fabio Pierce ? ?Prior Living Arrangements/Services ?  ?  ?  ?       ?  ?  ?  ?  ? ?Activities of Daily Living ?Home Assistive Devices/Equipment: Dentures (specify type), Walker (specify type), Shower chair without back, Grab bars in shower, Eyeglasses, Scales, CBG Meter ?ADL Screening (condition at time of admission) ?Patient's cognitive ability adequate to safely complete daily activities?: Yes ?Is the patient deaf or have difficulty hearing?: No ?Does the patient have difficulty seeing, even when wearing glasses/contacts?: No ?Does the patient have difficulty concentrating, remembering, or making decisions?: No ?Patient able to express need for assistance with ADLs?: Yes ?Does the patient have difficulty dressing or bathing?: No ?Independently performs ADLs?: No ?Communication: Independent ?Dressing  (OT): Needs assistance ?Is this a change from baseline?: Pre-admission baseline ?Grooming: Needs assistance ?Is this a change from baseline?: Pre-admission baseline ?Feeding: Independent ?Bathing: Needs assistance ?Is this a change from baseline?: Pre-admission baseline ?Toileting: Needs assistance ?Is this a change from baseline?: Pre-admission baseline ?In/Out Bed: Needs assistance ?Is this a change from baseline?: Pre-admission baseline ?Walks in Home: Independent with device (comment) ?Does the patient have difficulty walking or climbing stairs?: Yes ?Weakness of Legs: Both ?Weakness of Arms/Hands: None ? ?Permission Sought/Granted ?  ?  ?   ?   ?   ?   ? ?Emotional Assessment ?  ?  ?  ?  ?  ?  ? ?Admission diagnosis:  CHF (congestive heart failure) ([place]) [I50.9] ?Renal dysfunction [N28.9] ?Acute on chronic systolic congestive heart failure ([place]) [I50.23] ?Patient Active Problem List  ? Diagnosis Date Noted  ? CHF (congestive heart failure) ([place]) 07/06/2021  ? Coronary artery disease of native artery of native heart with stable angina pectoris ([place])   ? HFrEF (heart failure with reduced ejection fraction) ([place])   ? Pleural effusion due to CHF (congestive heart failure) ([place]) 06/21/2021  ? Acute systolic CHF (congestive heart failure) ([place]) 06/18/2021  ? AKI (acute kidney injury) ([place]) 06/18/2021  ? Acute respiratory failure with hypoxia ([place]) 06/18/2021  ? Mitral stenosis 06/18/2021  ? Chronic osteomyelitis ([place]) 06/18/2021  ? Myoclonic jerking- right hand 04/26/2021  ? Constipation 04/25/2021  ?  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 E. coli UTI 04/21/2021  ? MSSA bacteremia 04/19/2021  ? Osteomyelitis of great toe of right foot ([place]) 04/18/2021  ? sepsis secondary to pneumonia vs. osteomyelitis of rigth great toe  04/18/2021  ? Hyponatremia 04/18/2021  ? Diabetic peripheral neuropathy associated with type 2 diabetes mellitus ([place]) 01/08/2021  ? Peripheral vascular disease ([place]) 01/08/2021  ? Body mass index (BMI)  30.0-30.9, adult 03/24/2020  ? Carpal tunnel syndrome 03/24/2020  ? Colonic polyp 03/24/2020  ? Diabetic renal disease ([place]) 03/24/2020  ? Elevated PSA 03/24/2020  ? Essential hypertension 03/24/2020  ? 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 Headache 03/24/2020  ? Hyperlipidemia, unspecified 03/24/2020  ? Hyperlipidemia 03/24/2020  ? Long term (current) use of insulin ([place]) 03/24/2020  ? 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 ([place]) 03/24/2020  ? Stage 3 chronic kidney disease ([place]) 03/24/2020  ? 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 Gangrenous toe ([place])   ? Acute osteomyelitis ([place]) 09/13/2019  ? Quadriceps tendon rupture, left, initial encounter 01/10/2019  ? Closed comminuted fracture of left patella, initial encounter 01/08/2019  ? ?PCP:  [name], FNP ?Pharmacy:   ?[id number]?Phone: [phone]: [phone]? ?[facility]?Phone: [phone]: [phone] ? ? ? ? ?Social Determinants of Health (SDOH) Interventions ?  ? ?Readmission Risk Interventions ?   ? View : No data to display.  ?  ?  ?  ? ? ? ?

## 2021-07-07 NOTE — [place] Notes (Signed)
[facility]) Hospital Liaison Note ? ?Received request from [place], RN for hospice services at home after discharge. Chart and patient information under review by [facility] physician. Hospice eligibility confirmed. ? ?Spoke with wife [name] and son Marya Amsler at bedside to initiate education related to hospice philosophy, services and team approach to care. Patient/family verbalized understanding of information given. Per discussion the plan is for discharge home by private vehicle Thursday 3.30.23. ? ?DME needs discussed. Patient has a shower chair and walker in the home. Family requests hospital bed, OBT, O2 @ 2lpm, BSC and WC for delivery. Address verified and is correct in the chart. Marya Amsler, [phone], is the contact to arrange time of delivery for equipment. ? ?Please send signed and completed DNR home with patient/family. Please provide prescriptions at discharge to ensure ongoing symptom management. ? ?[facility] information and contact numbers given to [place] Above information shared with Neoma Laming, TOC.  ? ?Please call with any hospice related questions or concerns. ? ?Thank you for the opportunity to participate in this patients care. ? ?Margaretmary Eddy, BSN, RN ?[facility] Liaison ?[phone] ?

## 2021-07-08 ENCOUNTER — Encounter (HOSPITAL_COMMUNITY): Payer: Self-pay | Admitting: Cardiology

## 2021-07-08 DIAGNOSIS — N179 Acute kidney failure, unspecified: Secondary | ICD-10-CM | POA: Diagnosis not present

## 2021-07-08 DIAGNOSIS — E118 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: Secondary | ICD-10-CM

## 2021-07-08 DIAGNOSIS — I5023 Acute on chronic systolic (congestive) heart failure: Secondary | ICD-10-CM | POA: Diagnosis not present

## 2021-07-08 LAB — GLUCOSE, CAPILLARY
Glucose-Capillary: 155 mg/dL — ABNORMAL HIGH (ref 70–99)
Glucose-Capillary: 187 mg/dL — ABNORMAL HIGH (ref 70–99)

## 2021-07-08 MED ORDER — ALPRAZOLAM 0.25 MG PO TABS: 0.2500 mg | ORAL_TABLET | Freq: Three times a day (TID) | ORAL | 0 refills | Status: AC | PRN

## 2021-07-08 MED ORDER — TORSEMIDE 20 MG PO TABS: 20.0000 mg | ORAL_TABLET | Freq: Every day | ORAL | 0 refills | Status: AC

## 2021-07-08 MED ORDER — MORPHINE SULFATE (CONCENTRATE) 10 MG/0.5ML PO SOLN: 5.0000 mg | ORAL | 0 refills | Status: AC | PRN

## 2021-07-08 MED ORDER — POTASSIUM CHLORIDE ER 20 MEQ PO TBCR: 20.0000 meq | EXTENDED_RELEASE_TABLET | Freq: Every day | ORAL | Status: AC

## 2021-07-08 MED ORDER — METHOCARBAMOL 500 MG PO TABS: 500.0000 mg | ORAL_TABLET | Freq: Three times a day (TID) | ORAL | 0 refills | Status: AC | PRN

## 2021-07-08 NOTE — Discharge Summary (Signed)
?Physician Discharge Summary ?  ?Patient: Zachary [name] MRN: [id number] DOB: [birth year]  ?Admit date:     07/06/2021  ?Discharge date: 07/08/21  ?Discharge Physician: Murray Hodgkins  ? ?PCP: [name], FNP  ? ?Recommendations at discharge:  ? ? Home with hospice ?Narrow medications as appropriate ? ?Discharge Diagnoses: ?Principal Problem: ?  Acute on chronic systolic CHF (congestive heart failure) ([place]) ?Active Problems: ?  AKI (acute kidney injury) ([place]) ?  Chronic kidney disease (CKD), stage IIIa (moderate) (HCC) ?  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  Peripheral vascular disease ([place]) ?  Pressure injury of skin ? ?Resolved Problems: ?  * No resolved hospital problems. * ? ?Hospital Course: ?76 year old man PMH systolic CHF LVEF 88-50%, CKD stage IIIa, diabetes, recent hospitalization for decompensated CHF, rehospitalized 3/28 for decompensated CHF, AKI. Seen by cardiology.  Right heart catheterization 3/29. Seen by palliative care, CODE STATUS updated to DNR, plan for home with hospice, cardiology concurred. ? ?Acute on chronic systolic CHF decompensation ?--Status post right heart cath, continue aspirin, BiDil, Brilinta as tolerated.  No more labs.  Treat symptoms. ?  ?AKI on CKD stage IIIa secondary to cardiorenal syndrome. ?--Home with hospice.  No more labs. ?  ?CAD, multivessel disease ?--Not a candidate for CABG as per CT surgery evaluation on last admission. ?  ?Recent great toe osteomyelitis ?-Status post antibiotic treatment 3 weeks ago ?  ?PVD ?-On aspirin and Brilinta as tolerated ?  ?IDDM with hyperglycemia ?-Continue Jardiance and Lantus  ? ? ?  ? ?Consultants:  ?Cardiology ?Palliative Medicine ? ?Procedures performed: right heart cath  ?Disposition: Home with hospice ?Diet recommendation:  ?Discharge Diet Orders (From admission, onward)  ? ?  Start     Ordered  ? 07/08/21 0000  Diet - low sodium heart healthy       ? 07/08/21 1126  ? ?  ?  ? ?  ? ?Diet as  desired ? ?DISCHARGE MEDICATION: ?Allergies as of 07/08/2021   ? ?   Reactions  ? Metformin Hcl Other (See Comments)  ? Unknown reaction  ? Pravastatin Sodium Other (See Comments)  ? Muscle pain   ? ?  ? ?  ?Medication List  ?  ? ?STOP taking these medications   ? ?rosuvastatin 20 MG tablet ?Commonly known as: CRESTOR ?  ? ?  ? ?TAKE these medications   ? ?acetaminophen 500 MG tablet ?Commonly known as: TYLENOL ?Take 1,000 mg by mouth See admin instructions. Take 1000 mg 3 times daily, may take 1000 mg every 8 hours as needed for mild/moderate pain ?  ?ALPRAZolam 0.25 MG tablet ?Commonly known as: Duanne Moron ?Take 1 tablet (0.25 mg total) by mouth 3 (three) times daily as needed for anxiety or sleep. ?  ?aspirin EC 81 MG tablet ?Take 81 mg by mouth daily. ?  ?B-D ULTRAFINE III SHORT PEN 31G X 8 MM Misc ?Generic drug: Insulin Pen Needle ?Inject into the skin 5 (five) times daily. ?  ?bisoprolol 5 MG tablet ?Commonly known as: ZEBETA ?Take 1/2 tablet (2.5 mg total) by mouth daily. ?  ?Brilinta 90 MG Tabs tablet ?Generic drug: ticagrelor ?Take 1 tablet (90 mg total) by mouth 2 (two) times daily. ?  ?calcium carbonate 500 MG chewable tablet ?Commonly known as: TUMS - dosed in mg elemental calcium ?Chew 1,000 mg by mouth daily as needed for indigestion or heartburn. ?  ?freestyle lancets ?AS DIRECTED THREE TIMES A DAY ?  ?FREESTYLE LITE test strip ?  Generic drug: glucose blood ?TEST 3 TIMES A DAY E11.9 ?  ?isosorbide-hydrALAZINE 20-37.5 MG tablet ?Commonly known as: BIDIL ?Take 1/2 tablet by mouth 3 (three) times daily. ?  ?Jardiance 10 MG Tabs tablet ?Generic drug: empagliflozin ?Take 1 tablet (10 mg total) by mouth daily. ?  ?lactose free nutrition Liqd ?Take 237 mLs by mouth daily. Uses glucose control boost - chocolate ?  ?Lantus SoloStar 100 UNIT/ML Solostar Pen ?Generic drug: insulin glargine ?Inject 16 Units into the skin at bedtime. ?  ?methocarbamol 500 MG tablet ?Commonly known as: ROBAXIN ?Take 1 tablet (500 mg  total) by mouth every 8 (eight) hours as needed for muscle spasms. ?  ?morphine CONCENTRATE 10 MG/0.5ML Soln concentrated solution ?Place 0.25-0.5 mLs (5-10 mg total) under the tongue every 2 (two) hours as needed for shortness of breath or moderate pain. ?  ?nitroGLYCERIN 0.4 MG SL tablet ?Commonly known as: NITROSTAT ?Place 0.4 mg under the tongue every 5 (five) minutes as needed for chest pain. ?  ?pantoprazole 40 MG tablet ?Commonly known as: PROTONIX ?Take 40 mg by mouth daily. ?  ?polyethylene glycol 17 g packet ?Commonly known as: MIRALAX / GLYCOLAX ?Take 17 g by mouth 2 (two) times daily. ?  ?Potassium Chloride ER 20 MEQ Tbcr ?Take 20 mEq by mouth daily. ?What changed: when to take this ?  ?torsemide 20 MG tablet ?Commonly known as: DEMADEX ?Take 1 tablet (20 mg total) by mouth daily. ?  ? ?  ? ?  ?  ? ? ?  ?Discharge Care Instructions  ?(From admission, onward)  ?  ? ? ?  ? ?  Start     Ordered  ? 07/08/21 0000  Discharge wound care:       ?Comments: Foam dressing  ? 07/08/21 1126  ? ?  ?  ? ?  ? ? Follow-up Information   ? ? [facility] Follow up.   ?Specialty: Hospice and Palliative Medicine ?Why: home hospice ?Contact information: ?[place] [address] ?[phone] ? ?  ?  ? ?  ?  ? ?  ? ?Feels ok ? ?Discharge Exam: ?Filed Weights  ? 07/06/21 1750 07/07/21 0600 07/08/21 0500  ?Weight: 75.7 kg 75.3 kg 75.6 kg  ? ?Physical Exam ?Vitals reviewed.  ?Constitutional:   ?   General: He is not in acute distress. ?   Appearance: He is not ill-appearing or toxic-appearing.  ?Cardiovascular:  ?   Rate and Rhythm: Normal rate and regular rhythm.  ?   Heart sounds: No murmur heard. ?Pulmonary:  ?   Effort: Pulmonary effort is normal. No respiratory distress.  ?   Breath sounds: No wheezing, rhonchi or rales.  ?Neurological:  ?   Mental Status: He is alert.  ?Psychiatric:     ?   Mood and Affect: Mood normal.     ?   Behavior: Behavior normal.  ? ? ? ?Condition at discharge:  fair ? ?The results of significant diagnostics from this hospitalization (including imaging, microbiology, ancillary and laboratory) are listed below for reference.  ? ?Imaging Studies: ?DG Chest 1 View ? ?Result Date: 07/07/2021 ?CLINICAL DATA:  Congestive heart failure EXAM: CHEST  1 VIEW COMPARISON:  Chest x-ray dated July 06, 2021 FINDINGS: Cardiac and mediastinal contours within normal limits. Increased bilateral hazy opacities and similar small bilateral pleural effusions. No evidence of pneumothorax. IMPRESSION: Similar small bilateral pleural effusions, increased haziness of the bilateral lungs is likely due to layering of pleural fluid. Worsening pulmonary  edema is an additional consideration. Electronically Signed   By: Yetta Glassman M.D.   On: 07/07/2021 08:23  ? ?DG Chest 1 View ? ?Result Date: 06/23/2021 ?CLINICAL DATA:  Post [place] EXAM: CHEST  1 VIEW COMPARISON:  Chest x-ray dated June 23, 2021 FINDINGS: The heart size and mediastinal contours are within normal limits. Small left greater than right pleural effusions, left pleural effusion is decreased in size when compared to prior exam. Unchanged bibasilar opacities. No evidence of pneumothorax. The visualized skeletal structures are unremarkable. IMPRESSION: No evidence of pneumothorax. Electronically Signed   By: Yetta Glassman M.D.   On: 06/23/2021 13:35  ? ?DG Chest 1 View ? ?Result Date: 06/20/2021 ?CLINICAL DATA:  Follow-up pleural effusions. Status post right thoracentesis. EXAM: CHEST  1 VIEW COMPARISON:  Prior today FINDINGS: Previously seen right pleural effusion has nearly completely resolved since prior study. No pneumothorax visualized. Decreased atelectasis seen in the right lung base. Persistent consolidation or collapse is seen in the left lower lobe. Small left pleural effusion is unchanged. Pulmonary interstitial prominence remains stable, and mild interstitial edema cannot be excluded. Stable heart size. IMPRESSION: Near  complete resolution of right pleural effusion. No pneumothorax visualized. Decreased right basilar atelectasis. Stable left lower lobe consolidation or collapse, and small left pleural effusion. Electronically Signed   By:

## 2021-07-08 NOTE — TOC Transition Note (Addendum)
Transition of Care (TOC) - CM/SW Discharge Note ? ? ?Patient Details  ?Name: Zachary [name] ?MRN: [id number] ?Date of Birth: 08/11/1945 ? ?Transition of Care (TOC) CM/SW Contact:  ?Zenon Mayo, RN ?Phone Number: ?07/08/2021, 10:38 AM ? ? ?Clinical Narrative:    ?Patient is for dc today home with hospice with [facility].  Sons are here to take patient home, but the oxygen is not at the house yet.  NCM informed Financial controller not to let patient dc til oxygen is at the house.  Wife confirmed oxygen is not at the house and she is not sure what time it will get there.  ? ? ?Final next level of care: [place] ?Barriers to Discharge: Continued Medical Work up ? ? ?Patient Goals and CMS Choice ?Patient states their goals for this hospitalization and ongoing recovery are:: return home with hospice ?CMS Medicare.gov Compare Post Acute Care list provided to:: Patient Represenative (must comment) ?Choice offered to / list presented to : Spouse ? ?Discharge Placement ?  ?           ?  ?  ?  ?  ? ?Discharge Plan and Services ?  ?  ?           ?DME Arranged:  (Hospice will supply DME) ?  ?  ?  ?  ?HH Arranged: RN ?[place] Agency:  Teacher, early years/pre) ?Date HH Agency Contacted: 07/07/21 ?Time [place]: 2683 ?Representative spoke with at [place]: Fabio Pierce ? ?Social Determinants of Health (SDOH) Interventions ?  ? ? ?Readmission Risk Interventions ? ?  07/07/2021  ?  2:12 PM  ?Readmission Risk Prevention Plan  ?Transportation Screening Complete  ?PCP or Specialist Appt within 3-5 Days Complete  ?[place] or Home Care Consult Complete  ?Social Work Consult for [place] Planning/Counseling Complete  ?Palliative Care Screening Complete  ?Medication Review Press photographer) Complete  ? ? ? ? ? ?

## 2021-07-08 NOTE — [place] Notes (Signed)
Palliative: ? ?HPI: 76 y.o. 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 Not a candidate for CABG or PCI interventions. Of note, he has DNR record from Jan 2023 loaded in electronic records.   ? ?I met today with Zachary [name] and was joined by his sons at bedside. Zachary [name] is anxious to return home. He shares that equipment is being delivered to the home now and he is ready. Sons some to bedside and confirm that equipment should be delivered soon. They have been in touch with hospice for care at home. We reviewed needs and symptom management at home and guidance from hospice. Family are comfortable with moving forward and ready for home today. Trial off oxygen to ensure that he will not need for the short ride home. They report that he went without oxygen for multiple hours yesterday without incident or distress. I did explain that they do need to ensure that oxygen is delivered and in the home prior to leaving the hospital. They all express understanding.  ? ?All questions/concerns addressed. Emotional support provided. Discussed plan with bedside RN, CMRN, Dr. Sarajane Jews.  ? ?Exam: Ill-appearing. Alert and fully oriented. Sitting up in bed without distress. Tolerating room air so far. Neck pain improved. Moves all extremities.  ? ?Plan: ?Home with hospice today.  ?- Shortness of breath/pain: Roxanol SL 5-10 mg every 2 hours. May scheduled titrate as needed - hospice to manage at home.  ?Musculoskeletal pain: Heat pack, muscle cream, robaxin prn.  ?Anxiety/sleep: Xanax prn.  ?Constipation:  Senokot daily prn.  ? ?25 min ? ?Vinie Sill, NP ?Palliative Medicine Team ?Pager [phone] (Please see [email or web address] for schedule) ?Team Phone [phone]  ? ? ?Greater than 50%  of this time was spent counseling and coordinating care related to the above assessment and plan   ?

## 2021-07-08 NOTE — [place] Notes (Signed)
At 0911 Dr Sarajane Jews notified of pt's BP 94/56 sitting up in chair, previous in bed and was in the 90's also. pt due to get Bildil 20/37.5  1/2 tab.---did not give this med at this time.  Pt given his beta blocker.   ? ?At 0939 Dr Sarajane Jews responded to hold this am dose.  ?

## 2021-07-08 NOTE — [place] Notes (Signed)
Pt discharge instructions given to pt and sons at the bedside questions answered IV and tele removed. ?

## 2021-07-08 NOTE — Hospital Course (Signed)
76 year old man PMH systolic CHF LVEF 68-37%, CKD stage IIIa, diabetes, recent hospitalization for decompensated CHF, rehospitalized 3/28 for decompensated CHF, AKI. Seen by cardiology.  Right heart catheterization 3/29. Seen by palliative care, CODE STATUS updated DNR, plan for home with hospice. ? ?Acute on chronic systolic CHF decompensation ?--Status post right heart cath, continue aspirin, BiDil, Brilinta.  No more labs.  Treat symptoms. ?  ?AKI on CKD stage IIIa secondary to cardiorenal syndrome. ?--Plan Home with hospice.  No more labs. ?  ?CAD, multivessel disease ?--Not a candidate for CABG as per CT surgery evaluation on last admission. ?  ?Recent great toe osteomyelitis ?-Status post antibiotic treatment 3 weeks ago ?  ?PVD ?-On aspirin and Brilinta.  ?  ?IDDM with hyperglycemia ?-Continue Jardiance and Lantus, sliding scale. ?

## 2021-07-09 ENCOUNTER — Telehealth: Payer: Self-pay

## 2021-07-09 NOTE — Telephone Encounter (Signed)
Zachary [name]'s wife called to cancel his surgery with Dr. Posey Pronto on 07/26/2021. She stated he was just released from the hospital and is in hospice. Notified Dr. Posey Pronto and canceled his surgery at [facility] day. ?

## 2021-07-21 ENCOUNTER — Ambulatory Visit: Payer: Medicare PPO | Admitting: Internal Medicine

## 2021-07-26 ENCOUNTER — Ambulatory Visit (HOSPITAL_BASED_OUTPATIENT_CLINIC_OR_DEPARTMENT_OTHER): Admit: 2021-07-26 | Payer: Medicare PPO | Admitting: Podiatry

## 2021-07-26 ENCOUNTER — Encounter (HOSPITAL_BASED_OUTPATIENT_CLINIC_OR_DEPARTMENT_OTHER): Payer: Self-pay

## 2021-07-26 SURGERY — AMPUTATION, FOOT, PARTIAL
Anesthesia: Choice | Site: Foot | Laterality: Left

## 2021-08-04 ENCOUNTER — Encounter: Payer: Medicare PPO | Admitting: Podiatry

## 2021-08-09 DEATH — deceased

## 2021-08-18 ENCOUNTER — Encounter: Payer: Medicare PPO | Admitting: Podiatry

## 2021-08-19 LAB — ACID FAST CULTURE WITH REFLEXED SENSITIVITIES (MYCOBACTERIA): Acid Fast Culture: NEGATIVE

## 2022-06-06 ENCOUNTER — Other Ambulatory Visit (HOSPITAL_COMMUNITY): Payer: Self-pay
